# Patient Record
Sex: Male | Born: 1940 | Race: White | Hispanic: No | Marital: Single | Smoking: Former smoker
Health system: Southern US, Community
[De-identification: ages and names within clinical notes are randomized; demographics above are authoritative.]

## PROBLEM LIST (undated history)

## (undated) DIAGNOSIS — E785 Hyperlipidemia, unspecified: Secondary | ICD-10-CM

## (undated) DIAGNOSIS — C801 Malignant (primary) neoplasm, unspecified: Secondary | ICD-10-CM

## (undated) DIAGNOSIS — D649 Anemia, unspecified: Secondary | ICD-10-CM

## (undated) DIAGNOSIS — Z5189 Encounter for other specified aftercare: Secondary | ICD-10-CM

## (undated) DIAGNOSIS — K922 Gastrointestinal hemorrhage, unspecified: Secondary | ICD-10-CM

## (undated) DIAGNOSIS — K219 Gastro-esophageal reflux disease without esophagitis: Secondary | ICD-10-CM

## (undated) DIAGNOSIS — S52502A Unspecified fracture of the lower end of left radius, initial encounter for closed fracture: Secondary | ICD-10-CM

## (undated) DIAGNOSIS — S52602A Unspecified fracture of lower end of left ulna, initial encounter for closed fracture: Secondary | ICD-10-CM

## (undated) DIAGNOSIS — J189 Pneumonia, unspecified organism: Secondary | ICD-10-CM

## (undated) DIAGNOSIS — S42202A Unspecified fracture of upper end of left humerus, initial encounter for closed fracture: Secondary | ICD-10-CM

## (undated) DIAGNOSIS — K449 Diaphragmatic hernia without obstruction or gangrene: Secondary | ICD-10-CM

## (undated) DIAGNOSIS — I350 Nonrheumatic aortic (valve) stenosis: Secondary | ICD-10-CM

## (undated) DIAGNOSIS — R0602 Shortness of breath: Secondary | ICD-10-CM

## (undated) DIAGNOSIS — IMO0001 Reserved for inherently not codable concepts without codable children: Secondary | ICD-10-CM

## (undated) DIAGNOSIS — J449 Chronic obstructive pulmonary disease, unspecified: Secondary | ICD-10-CM

## (undated) DIAGNOSIS — I10 Essential (primary) hypertension: Secondary | ICD-10-CM

## (undated) DIAGNOSIS — R011 Cardiac murmur, unspecified: Secondary | ICD-10-CM

## (undated) DIAGNOSIS — I214 Non-ST elevation (NSTEMI) myocardial infarction: Secondary | ICD-10-CM

## (undated) DIAGNOSIS — M1990 Unspecified osteoarthritis, unspecified site: Secondary | ICD-10-CM

## (undated) HISTORY — DX: Chronic obstructive pulmonary disease, unspecified: J44.9

## (undated) HISTORY — DX: Non-ST elevation (NSTEMI) myocardial infarction: I21.4

## (undated) HISTORY — PX: TOTAL KNEE ARTHROPLASTY: SHX125

## (undated) HISTORY — DX: Nonrheumatic aortic (valve) stenosis: I35.0

## (undated) HISTORY — PX: FRACTURE SURGERY: SHX138

## (undated) HISTORY — PX: JOINT REPLACEMENT: SHX530

## (undated) HISTORY — PX: TONSILLECTOMY: SUR1361

## (undated) HISTORY — PX: UPPER GASTROINTESTINAL ENDOSCOPY: SHX188

## (undated) HISTORY — PX: CARDIAC CATHETERIZATION: SHX172

## (undated) HISTORY — PX: EYE SURGERY: SHX253

## (undated) HISTORY — DX: Hyperlipidemia, unspecified: E78.5

## (undated) HISTORY — PX: TOTAL HIP ARTHROPLASTY: SHX124

---

## 2000-06-06 ENCOUNTER — Encounter: Payer: Self-pay | Admitting: Family Medicine

## 2000-06-06 ENCOUNTER — Encounter: Admission: RE | Admit: 2000-06-06 | Discharge: 2000-06-06 | Payer: Self-pay | Admitting: Family Medicine

## 2000-06-13 ENCOUNTER — Encounter: Payer: Self-pay | Admitting: Family Medicine

## 2000-06-13 ENCOUNTER — Encounter: Admission: RE | Admit: 2000-06-13 | Discharge: 2000-06-13 | Payer: Self-pay | Admitting: Family Medicine

## 2000-08-23 ENCOUNTER — Other Ambulatory Visit: Admission: RE | Admit: 2000-08-23 | Discharge: 2000-08-23 | Payer: Self-pay | Admitting: Family Medicine

## 2000-09-01 ENCOUNTER — Encounter: Payer: Self-pay | Admitting: Family Medicine

## 2000-09-01 ENCOUNTER — Encounter: Admission: RE | Admit: 2000-09-01 | Discharge: 2000-09-01 | Payer: Self-pay | Admitting: Family Medicine

## 2000-10-14 ENCOUNTER — Encounter: Payer: Self-pay | Admitting: Family Medicine

## 2000-10-14 ENCOUNTER — Encounter: Admission: RE | Admit: 2000-10-14 | Discharge: 2000-10-14 | Payer: Self-pay | Admitting: Family Medicine

## 2000-10-31 ENCOUNTER — Ambulatory Visit (HOSPITAL_COMMUNITY): Admission: RE | Admit: 2000-10-31 | Discharge: 2000-10-31 | Payer: Self-pay | Admitting: Gastroenterology

## 2000-11-11 ENCOUNTER — Ambulatory Visit (HOSPITAL_COMMUNITY): Admission: RE | Admit: 2000-11-11 | Discharge: 2000-11-11 | Payer: Self-pay | Admitting: Orthopedic Surgery

## 2000-11-11 ENCOUNTER — Encounter: Payer: Self-pay | Admitting: Orthopedic Surgery

## 2001-11-03 ENCOUNTER — Encounter: Payer: Self-pay | Admitting: Orthopedic Surgery

## 2001-11-08 ENCOUNTER — Inpatient Hospital Stay (HOSPITAL_COMMUNITY): Admission: AD | Admit: 2001-11-08 | Discharge: 2001-11-12 | Payer: Self-pay | Admitting: Orthopedic Surgery

## 2002-03-29 ENCOUNTER — Encounter: Payer: Self-pay | Admitting: Orthopedic Surgery

## 2002-03-29 ENCOUNTER — Inpatient Hospital Stay (HOSPITAL_COMMUNITY): Admission: RE | Admit: 2002-03-29 | Discharge: 2002-04-03 | Payer: Self-pay | Admitting: Orthopedic Surgery

## 2004-02-03 ENCOUNTER — Encounter: Admission: RE | Admit: 2004-02-03 | Discharge: 2004-05-03 | Payer: Self-pay | Admitting: Family Medicine

## 2004-02-10 ENCOUNTER — Ambulatory Visit (HOSPITAL_COMMUNITY): Admission: RE | Admit: 2004-02-10 | Discharge: 2004-02-10 | Payer: Self-pay | Admitting: Gastroenterology

## 2004-12-06 HISTORY — PX: COLONOSCOPY: SHX174

## 2004-12-24 ENCOUNTER — Inpatient Hospital Stay (HOSPITAL_COMMUNITY): Admission: EM | Admit: 2004-12-24 | Discharge: 2004-12-29 | Payer: Self-pay | Admitting: Emergency Medicine

## 2006-07-22 ENCOUNTER — Encounter: Admission: RE | Admit: 2006-07-22 | Discharge: 2006-07-22 | Payer: Self-pay | Admitting: Family Medicine

## 2010-12-06 DIAGNOSIS — I350 Nonrheumatic aortic (valve) stenosis: Secondary | ICD-10-CM

## 2010-12-06 HISTORY — DX: Nonrheumatic aortic (valve) stenosis: I35.0

## 2011-07-07 DIAGNOSIS — I214 Non-ST elevation (NSTEMI) myocardial infarction: Secondary | ICD-10-CM

## 2011-07-07 HISTORY — DX: Non-ST elevation (NSTEMI) myocardial infarction: I21.4

## 2011-07-15 ENCOUNTER — Inpatient Hospital Stay (HOSPITAL_COMMUNITY)
Admission: EM | Admit: 2011-07-15 | Discharge: 2011-07-17 | DRG: 811 | Disposition: A | Discharge status: Home / Self Care | Payer: Medicare Other | Source: Ambulatory Visit | Attending: Internal Medicine | Admitting: Internal Medicine

## 2011-07-15 ENCOUNTER — Emergency Department (HOSPITAL_COMMUNITY): Payer: Medicare Other

## 2011-07-15 DIAGNOSIS — Q391 Atresia of esophagus with tracheo-esophageal fistula: Secondary | ICD-10-CM

## 2011-07-15 DIAGNOSIS — D509 Iron deficiency anemia, unspecified: Principal | ICD-10-CM | POA: Diagnosis present

## 2011-07-15 DIAGNOSIS — I359 Nonrheumatic aortic valve disorder, unspecified: Secondary | ICD-10-CM | POA: Diagnosis present

## 2011-07-15 DIAGNOSIS — K31811 Angiodysplasia of stomach and duodenum with bleeding: Secondary | ICD-10-CM | POA: Diagnosis present

## 2011-07-15 DIAGNOSIS — K449 Diaphragmatic hernia without obstruction or gangrene: Secondary | ICD-10-CM | POA: Diagnosis present

## 2011-07-15 DIAGNOSIS — I5033 Acute on chronic diastolic (congestive) heart failure: Secondary | ICD-10-CM | POA: Diagnosis present

## 2011-07-15 DIAGNOSIS — Z7982 Long term (current) use of aspirin: Secondary | ICD-10-CM

## 2011-07-15 DIAGNOSIS — Z87891 Personal history of nicotine dependence: Secondary | ICD-10-CM

## 2011-07-15 DIAGNOSIS — J4489 Other specified chronic obstructive pulmonary disease: Secondary | ICD-10-CM | POA: Diagnosis present

## 2011-07-15 DIAGNOSIS — I2489 Other forms of acute ischemic heart disease: Secondary | ICD-10-CM | POA: Diagnosis present

## 2011-07-15 DIAGNOSIS — Z96649 Presence of unspecified artificial hip joint: Secondary | ICD-10-CM

## 2011-07-15 DIAGNOSIS — I214 Non-ST elevation (NSTEMI) myocardial infarction: Secondary | ICD-10-CM | POA: Diagnosis present

## 2011-07-15 DIAGNOSIS — J449 Chronic obstructive pulmonary disease, unspecified: Secondary | ICD-10-CM | POA: Diagnosis present

## 2011-07-15 DIAGNOSIS — Z794 Long term (current) use of insulin: Secondary | ICD-10-CM

## 2011-07-15 DIAGNOSIS — I959 Hypotension, unspecified: Secondary | ICD-10-CM | POA: Diagnosis present

## 2011-07-15 DIAGNOSIS — I10 Essential (primary) hypertension: Secondary | ICD-10-CM | POA: Diagnosis present

## 2011-07-15 DIAGNOSIS — E119 Type 2 diabetes mellitus without complications: Secondary | ICD-10-CM | POA: Diagnosis present

## 2011-07-15 DIAGNOSIS — M1990 Unspecified osteoarthritis, unspecified site: Secondary | ICD-10-CM | POA: Diagnosis present

## 2011-07-15 DIAGNOSIS — E785 Hyperlipidemia, unspecified: Secondary | ICD-10-CM | POA: Diagnosis present

## 2011-07-15 DIAGNOSIS — Z96659 Presence of unspecified artificial knee joint: Secondary | ICD-10-CM

## 2011-07-15 DIAGNOSIS — I509 Heart failure, unspecified: Secondary | ICD-10-CM | POA: Diagnosis present

## 2011-07-15 DIAGNOSIS — I248 Other forms of acute ischemic heart disease: Secondary | ICD-10-CM | POA: Diagnosis present

## 2011-07-15 LAB — URINALYSIS, ROUTINE W REFLEX MICROSCOPIC
Bilirubin Urine: NEGATIVE
Glucose, UA: NEGATIVE mg/dL
Hgb urine dipstick: NEGATIVE
Ketones, ur: 15 mg/dL — AB
Protein, ur: NEGATIVE mg/dL

## 2011-07-15 LAB — FOLATE: Folate: >20 ng/mL

## 2011-07-15 LAB — POCT I-STAT TROPONIN I

## 2011-07-15 LAB — DIFFERENTIAL
Basophils Relative: 1 % (ref 0–1)
Lymphocytes Relative: 22 % (ref 12–46)
Monocytes Absolute: 1.1 10*3/uL — ABNORMAL HIGH (ref 0.1–1.0)
Monocytes Relative: 11 % (ref 3–12)
Neutro Abs: 7.1 10*3/uL (ref 1.7–7.7)

## 2011-07-15 LAB — CBC
HCT: 21.8 % — ABNORMAL LOW (ref 39.0–52.0)
Hemoglobin: 6.2 g/dL — CL (ref 13.0–17.0)
MCH: 22.1 pg — ABNORMAL LOW (ref 26.0–34.0)
MCHC: 28.4 g/dL — ABNORMAL LOW (ref 30.0–36.0)
MCV: 77.6 fL — ABNORMAL LOW (ref 78.0–100.0)

## 2011-07-15 LAB — CK TOTAL AND CKMB (NOT AT ARMC)
CK, MB: 8.4 ng/mL (ref 0.3–4.0)
Relative Index: 4.7 — ABNORMAL HIGH (ref 0.0–2.5)
Total CK: 190 U/L (ref 7–232)

## 2011-07-15 LAB — BASIC METABOLIC PANEL
BUN: 33 mg/dL — ABNORMAL HIGH (ref 6–23)
Calcium: 9.6 mg/dL (ref 8.4–10.5)
Creatinine, Ser: 1.24 mg/dL (ref 0.50–1.35)
GFR calc Af Amer: >60 mL/min (ref >60)

## 2011-07-15 LAB — GLUCOSE, CAPILLARY
Glucose-Capillary: 127 mg/dL — ABNORMAL HIGH (ref 70–99)
Glucose-Capillary: 69 mg/dL — ABNORMAL LOW (ref 70–99)

## 2011-07-15 LAB — IRON AND TIBC
Iron: 42 ug/dL (ref 42–135)
Saturation Ratios: 9 % — ABNORMAL LOW (ref 20–55)
TIBC: 473 ug/dL — ABNORMAL HIGH (ref 215–435)
UIBC: 431 ug/dL

## 2011-07-15 LAB — PROTIME-INR: Prothrombin Time: 14.6 seconds (ref 11.6–15.2)

## 2011-07-15 LAB — HEPATIC FUNCTION PANEL
ALT: 17 U/L (ref 0–53)
AST: 63 U/L — ABNORMAL HIGH (ref 0–37)
Albumin: 3.4 g/dL — ABNORMAL LOW (ref 3.5–5.2)
Alkaline Phosphatase: 55 U/L (ref 39–117)
Total Bilirubin: 0.3 mg/dL (ref 0.3–1.2)
Total Protein: 6.6 g/dL (ref 6.0–8.3)

## 2011-07-15 LAB — FERRITIN: Ferritin: 6 ng/mL — ABNORMAL LOW (ref 22–322)

## 2011-07-15 LAB — CARDIAC PANEL(CRET KIN+CKTOT+MB+TROPI): CK, MB: 6.9 ng/mL (ref 0.3–4.0)

## 2011-07-15 LAB — ABO/RH: ABO/RH(D): O POS

## 2011-07-15 LAB — PRO B NATRIURETIC PEPTIDE: Pro B Natriuretic peptide (BNP): 620.9 pg/mL — ABNORMAL HIGH (ref 0–125)

## 2011-07-15 LAB — VITAMIN B12: Vitamin B-12: 270 pg/mL (ref 211–911)

## 2011-07-16 LAB — CBC
Hemoglobin: 8.7 g/dL — ABNORMAL LOW (ref 13.0–17.0)
MCH: 23.9 pg — ABNORMAL LOW (ref 26.0–34.0)
MCHC: 30.1 g/dL (ref 30.0–36.0)
MCHC: 30.1 g/dL (ref 30.0–36.0)
RDW: 16.9 % — ABNORMAL HIGH (ref 11.5–15.5)
RDW: 16.9 % — ABNORMAL HIGH (ref 11.5–15.5)
WBC: 8.5 10*3/uL (ref 4.0–10.5)

## 2011-07-16 LAB — CARDIAC PANEL(CRET KIN+CKTOT+MB+TROPI)
Relative Index: 3.1 — ABNORMAL HIGH (ref 0.0–2.5)
Total CK: 183 U/L (ref 7–232)
Troponin I: 2.77 ng/mL (ref <0.30)

## 2011-07-16 LAB — BASIC METABOLIC PANEL
Calcium: 9 mg/dL (ref 8.4–10.5)
GFR calc Af Amer: >60 mL/min (ref >60)
GFR calc non Af Amer: >60 mL/min (ref >60)
Glucose, Bld: 90 mg/dL (ref 70–99)
Potassium: 3.8 mEq/L (ref 3.5–5.1)
Sodium: 140 mEq/L (ref 135–145)

## 2011-07-16 LAB — LIPID PANEL
HDL: 25 mg/dL — ABNORMAL LOW (ref >39)
LDL Cholesterol: 36 mg/dL (ref 0–99)
Triglycerides: 113 mg/dL (ref <150)

## 2011-07-16 LAB — GLUCOSE, CAPILLARY
Glucose-Capillary: 180 mg/dL — ABNORMAL HIGH (ref 70–99)
Glucose-Capillary: 154 mg/dL — ABNORMAL HIGH (ref 70–99)

## 2011-07-17 DIAGNOSIS — G459 Transient cerebral ischemic attack, unspecified: Secondary | ICD-10-CM

## 2011-07-17 DIAGNOSIS — I359 Nonrheumatic aortic valve disorder, unspecified: Secondary | ICD-10-CM

## 2011-07-17 DIAGNOSIS — I214 Non-ST elevation (NSTEMI) myocardial infarction: Secondary | ICD-10-CM

## 2011-07-17 LAB — CROSSMATCH
Antibody Screen: NEGATIVE
Unit division: 0
Unit division: 0

## 2011-07-17 LAB — CBC
HCT: 30.2 % — ABNORMAL LOW (ref 39.0–52.0)
Hemoglobin: 9.3 g/dL — ABNORMAL LOW (ref 13.0–17.0)
MCH: 24.7 pg — ABNORMAL LOW (ref 26.0–34.0)
MCV: 80.1 fL (ref 78.0–100.0)
RBC: 3.77 MIL/uL — ABNORMAL LOW (ref 4.22–5.81)
WBC: 11.1 10*3/uL — ABNORMAL HIGH (ref 4.0–10.5)

## 2011-07-17 LAB — BASIC METABOLIC PANEL
BUN: 14 mg/dL (ref 6–23)
CO2: 27 mEq/L (ref 19–32)
Chloride: 102 mEq/L (ref 96–112)
Creatinine, Ser: 0.72 mg/dL (ref 0.50–1.35)
Glucose, Bld: 153 mg/dL — ABNORMAL HIGH (ref 70–99)

## 2011-07-17 LAB — HOMOCYSTEINE: Homocysteine: 9.7 umol/L (ref 4.0–15.4)

## 2011-07-17 LAB — GLUCOSE, CAPILLARY: Glucose-Capillary: 171 mg/dL — ABNORMAL HIGH (ref 70–99)

## 2011-07-17 LAB — CARDIAC PANEL(CRET KIN+CKTOT+MB+TROPI)
Relative Index: 2.4 (ref 0.0–2.5)
Total CK: 132 U/L (ref 7–232)

## 2011-07-21 NOTE — H&P (Signed)
NAME:  Casey Gilmore, [name]:  [id number]  MEDICAL RECORD NO.:  [id number]  LOCATION:  [facility]                         FACILITY:  [facility]  PHYSICIAN:  Casey Scott, MD     DATE OF BIRTH:  [birth year]  DATE OF ADMISSION:  07/15/2011 DATE OF DISCHARGE:                             HISTORY & PHYSICAL   PRIMARY CARE PHYSICIAN:  Casey Raider, MD  GASTROENTEROLOGIST:  Casey All. Madilyn Fireman, MD  CHIEF COMPLAINTS:  Progressive generalized weakness.  Chest pain last night.  HISTORY OF PRESENTING ILLNESS:  Casey Gilmore is a 70 year old Caucasian male patient with history of prior episode of GI bleeding secondary to gastric angiodysplasia, type 2 diabetes mellitus, hypertension, dyslipidemia, COPD who was sent from the primary care physician's office secondary to above symptoms and severe anemia with a hemoglobin of 6 gm/dL.  The patient indicates that he has been progressively feeling weak, tired, decreased energy, dyspnea on exertion ongoing for about 3 weeks duration.  He denies any fevers or chills or night sweats.  He indicates his appetite is good.  He denies any nausea or vomiting. There is no history of weight loss.  He has a single BM every day.  The stools have been black for years which he indicates is secondary to iron supplements.  There is no change in his bowel habits.  He has not seen any bright red blood in the stools.  Last night, he experienced a brief episode of left upper dull 3/10 chest pain which was not radiating.  It was associated with some dyspnea, but no diaphoresis.  At times, he has complained of feeling dizzy and lightheadedness on ambulation.  For these persistent symptoms, the patient went to the MD's office, where his hemoglobin was found to be 6 gm/dL.  This was thought to be secondary to GI bleed.  He had a similar presentation in 2006.  The [facility] hospitalist were requested to admit the patient for further evaluation and management.  The ED  physician has consulted the cardiologist for positive cardiac markers suggestive of non-ST elevation MI and the gastroenterologist.  PAST MEDICAL HISTORY: 1. Upper GI bleed secondary to gastric angiodysplasia. 2. Type 2 diabetes mellitus, insulin dependent. 3. Hypertension. 4. Hyperlipidemia. 5. COPD. 6. Hiatal hernia. 7. Osteoarthritis. 8. Aortic sclerosis.  PAST SURGICAL HISTORY: 1. Left total hip replacement. 2. Right hip surgery. 3. Left total knee replacement.  ALLERGIES:  No known drug allergies.  HOME MEDICATIONS AS PER PRIMARY CARE PHYSICIAN'S OFFICE NOTE: 1. Iron 325 mg, 2 tablets p.o. daily. 2. Aspirin 81 mg p.o. daily. 3. Metformin 1000 mg p.o. b.i.d. 4. Omeprazole 40 mg p.o. daily. 5. Lisinopril-hydrochlorothiazide 20/12.5 mg p.o. daily. 6. Glimepiride 4 mg p.o. daily. 7. Lantus SoloSTAR 50 units subcutaneously daily. 8. Hydrocodone/acetaminophen 5-325 mg p.o. b.i.d. p.r.n. 9. Vytorin 10-40 mg p.o. daily.  FAMILY HISTORY:  Negative.  SOCIAL HISTORY:  The patient is single.  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  He lives alone and is independent of activities of daily living.  He quit smoking in 1980s.  There is no history of alcohol or drug abuse.  ADVANCED DIRECTIVES:  Full code.  REVIEW OF SYSTEMS:  All systems reviewed  and apart from history of presenting illness are negative.  PHYSICAL EXAMINATION:  GENERAL:  Casey Gilmore is a moderately built and overweight male patient who is in no obvious distress. VITAL SIGNS:  His temperature 98.5 degrees Fahrenheit, blood pressure 117/60 mmHg which earlier was 94/46 mmHg, pulse 87 per minute regular, respirations 21 per minute and saturating at 98% on room air. HEAD, EYES, ENT:  Nontraumatic, normocephalic.  Pupils equally reacting to light and accommodation.  Oral mucosa is moist and no acute findings. NECK:  Supple.  No JVD.  Bilateral carotid bruits.  Question conducted from his heart murmur.  Neck  is supple. LYMPHATICS:  No lymphadenopathy. RESPIRATORY SYSTEM:  Occasional basal crackles, but otherwise clear to auscultation and no increased work of breathing. CARDIOVASCULAR SYSTEM:  First and second heart sounds heard.  Regular rate and rhythm.  No JVD.  A 2 x 6 systolic murmur at the apex. ABDOMEN:  Nondistended, nontender, soft and normal bowel sounds heard. No organomegaly or mass appreciated. CENTRAL NERVOUS SYSTEM:  The patient is awake, alert, oriented x3 with no focal neurological deficits. EXTREMITIES:  With trace bilateral ankle edema.  Grade 5/5 power. Peripheral pulses symmetrically felt. SKIN:  Without any rashes. MUSCULOSKELETAL SYSTEM:  Negative.  LABORATORY DATA:  Cardiac enzymes show elevated troponin's with a peak of 2.68.  Coagulation indices are within normal limits.  CK-MB was 9 with CK of 190.  Basic metabolic panel shows glucose of 115, BUN 33, creatinine 1.24.  Pro-BNP 621.  Stool occult blood is positive. Urinalysis does not show features of urinary tract infection.  CBC shows hemoglobin of 6.2, hematocrit of 22, MCV 78, white blood cell 11 and platelets 266.  Chest x-ray shows no active cardiopulmonary disease.  EKG is sinus rhythm at 90 beats per minute with normal axis, no ischemic changes found.  ASSESSMENT AND PLAN: 1. Symptomatic severe microcytic anemia, probably iron-deficiency     anemia, possibly from slow gastrointestinal blood loss.  We will     admit the patient to Step-Down Unit.  Obtain anemia panel prior to     transfusion.  Transfuse 3 units of packed red blood cells under IV     Lasix cover.  GI MD have been consulted by the ED physician for     evaluation and the patient may need upper and maybe a lower     endoscopy.  We will hold the patient's aspirin secondary to the GI     bleeding.  We will place the patient on IV proton-pump inhibitors. 2. Non-ST-elevation myocardial infarction secondary to demand ischemia     from his  anemia.  We will cycle cardiac enzymes.  We will obtain 2-     D echocardiogram.  We will hold aspirin secondary to his GI     bleeding issues.  Cardiology has consulted and agree with     transfusing packed red blood cells and once his GI situation is     stabilized then for a possible stress test. 3. Type 2 insulin-dependent diabetes mellitus.  We will place the     patient on the reduced doses of insulin secondary to him being     placed on a clear liquid diet.  We will hold his oral hypoglycemic     agents and monitor. 4. Hypertension.  The patient had a soft blood pressure on arrival.     Given his severe anemia and his GI bleeding, we will hold his     antihypertensives at this  time and monitor his blood pressures     closely. 5. Chronic obstructive pulmonary disease is stable. 6. Full code status. 7. Acute diastolic congestive heart failure secondary to high output     from his severe anemia.  For Lasix in between transfusions and     monitor.  Time taken in coordinating this history and physical note is 60 minutes.     Casey Scott, MD     AH/MEDQ  D:  07/15/2011  T:  07/15/2011  Job:  [id number]  cc:   Casey Gilmore, M.D. John C. Madilyn Gilmore, M.D. Armanda Magic, M.D.  Electronically Signed by Casey Scott MD on 07/21/2011 12:50:00 PM

## 2011-07-21 NOTE — Discharge Summary (Signed)
NAMEMarland Kitchen  RITVIK, MCZEAL NO.:  [id number]  MEDICAL RECORD NO.:  [id number]  LOCATION:  2009                         FACILITY:  [facility]  PHYSICIAN:  Marcellus Scott, MD     DATE OF BIRTH:  [birth year]  DATE OF ADMISSION:  07/15/2011 DATE OF DISCHARGE:  07/17/2011                              DISCHARGE SUMMARY   PRIMARY CARE PHYSICIAN:  Lupita Raider, MD  GASTROENTEROLOGIST:  Everardo All. Madilyn Fireman, MD.  CARDIOLOGIST:  Armanda Magic, MD  DISCHARGE DIAGNOSES: 1. Symptomatic severe iron-deficiency anemia. 2. Possible slow upper gastrointestinal bleeding secondary to     gastroduodenal arteriovenous malformations, status post     obliteration with Argon plasma coagulator. 3. Non-ST-elevation myocardial infarction secondary to demand     ischemia. 4. Severe aortic stenosis.  For outpatient evaluation. 5. Insulin-dependent type 2 diabetes mellitus. 6. Hypertension. 7. Chronic obstructive pulmonary disease. 8. Brief acute diastolic congestive heart failure secondary to high     output from severe anemia.  Resolved. 9. Schatzki's ring and moderate hiatal hernia on EGD. 10.Possible B12 deficiency. 11.Bilateral carotid bruits. 12.Hyperlipidemia.  DISCHARGE MEDICATIONS: 1. Enteric-coated aspirin 81 mg p.o. daily. 2. Vitamin B12 1000 mcg p.o. daily. 3. Ferrous sulfate 325 mg p.o. t.i.d. 4. Amaryl 4 mg p.o. daily. 5. Hydrocodone/acetaminophen 5/325 mg tablet, one p.o. b.i.d. p.r.n.     pain. 6. Lantus 50 units subcutaneously daily. 7. Lopressor 12.5 mg p.o. b.i.d. 8. Metformin 1000 mg p.o. b.i.d. 9. Omeprazole 40 mg p.o. daily. 10.Vytorin 10/40 p.o. q.p.m.  PROCEDURES:  EGD on the July 16, 2011, impression, A.  Gastroduodenal AV malformations obliterated with Argon plasma coagulator.  B. Schatzki's ring and moderate hiatal hernia.  C.  Otherwise normal endoscopy.  IMAGING: 1. Chest x-ray July 15, 2011, impression, no active cardiopulmonary     disease. 2. A 2-D  echocardiogram July 16, 2011, impression, moderate left     ventricular concentric hypertrophy.  Left ventricular ejection     fraction 60-65%.  Severe aortic stenosis. The valve area is 1.06     cm2 by Vmax. 3. Bilateral carotid Dopplers.  No evidence of ICA stenosis.     Vertebral artery flow is antegrade.  LABORATORY DATA:  Cardiac enzymes were positive with peak troponin of 3.17, but normal CK, and a peak CK-MB of 8.4.  CBC hemoglobin 9.3, hematocrit 30.2, white blood cell 11.1, MCV 80, platelets 247.  Basic metabolic panel within normal limits except glucose 153.  Lipid panel significant for HDL 25, LDL 36.  Anemia panel showed iron 42, total iron- binding capacity 473, percentage saturation 9, vitamin B12 270, serum folate greater than 20 and ferritin of 6.  Urinalysis was negative for features of urinary tract infection.  Stool was positive for occult blood.  CONSULTATIONS: 1. Gastroenterology, Dr. Herbert Moors of [facility]. 2. Cardiology Dr. Carolanne Grumbling.  DIET:  Diabetic diet.  ACTIVITY:  Ad lib.  TODAY'S COMPLAINTS:  None.  The patient indicates that he feels fine. He has tolerated regular diet.  He has no dizziness or lightheadedness with ambulation.  He denies any chest pain or dyspnea.  He is eager to go home.  PHYSICAL EXAMINATION:  GENERAL:  The patient is in  no obvious distress. VITAL SIGNS:  Temperature 98.2 degrees Fahrenheit, pulse 82 per minute, respiration rate 20 per minute, blood pressure 121/77 mmHg and saturating at 99% on 2 liters of oxygen.  CBGs range between 154-180 mg/dL. RESPIRATORY SYSTEM:  Clear. CARDIOVASCULAR SYSTEM:  First and second heart sounds heard regular. Systolic ejection murmur 2/6 at the apex radiating to both carotids. ABDOMEN:  Nondistended, soft and normal bowel sounds heard. CENTRAL NERVOUS SYSTEM:  Patient is awake, alert, oriented x3 with no focal neurological deficits.  HOSPITAL COURSE:  Mr. Liou is a very  pleasant 70 year old Caucasian male patient with history of prior upper GI bleed in 2006 secondary to gastric angiodysplasia, insulin-dependent type 2 diabetes mellitus, hypertension, COPD who presented with 3 weeks history of progressive weakness, tiredness, decreased energy and dyspnea on exertion.  He also experienced an episode of left-sided chest pain on the night prior to admission.  He went to see his primary care physician where lab studies revealed a hemoglobin of 6 and the patient was asked to come to the emergency room for admission for further evaluation and management.  PROBLEM: 1. Asymptomatic severe iron deficiency anemia.  This was most likely     secondary to slow upper GI bleed from gastroduodenal AV     malformations.  There is possible B12 deficiency too.  The patient     was admitted to the step-down unit.  He was transfused a total of 3     units of packed red blood cells with appropriate response.     Gastroenterology was consulted.  They performed an EGD and     obliterated the AV malformations as indicated above.  They     recommend avoiding NSAIDs, but have cleared him for to continue his     low-dose aspirin.  He will ultimately likely need outpatient     capsule endoscopy.  This dictator has discussed with the     gastroenterologist on-call who have cleared him for discharge.  He     is to follow up with his primary gastroenterologist in 3-4 weeks     from discharge. 2. Slow upper GI bleed secondary to gastroduodenal AV malformations.     Management as above. 3. Non-ST-elevation MI secondary to demand ischemia.  Cardiology was     consulted.  They indicate that this was a difficult situation.  He     needs a cath, but any intervention such as CABG, aortic valve     replacement or PCI would require anticoagulation which would be     contraindicated in the recent GI bleed..  They recommend adding low-     dose beta blocker and statin and continuing the  low-dose aspirin     and follow up with the primary cardiologist in 2 weeks from     discharge.  If his hemoglobins remained stable and no further     bleeding then may proceed with right and left heart cath and     question CABG and aortic valve replacement. 4. Severe aortic stenosis.  Management as per above. 5. Insulin-dependent type 2 diabetes mellitus.  Continue home dose of     insulins and oral medications. 6. Hypertension.  The patient's antihypertensive medications were held     secondary to soft or low blood pressures.  He is being started on     low-dose beta blockers.  We will hold his     lisinopril/hydrochlorothiazide at this time.  However, consider     resuming  at least the ACE inhibitor at low dose as an outpatient     for her nephro protection from his diabetes.  DISPOSITION:  The patient is discharged home in stable condition.  FOLLOWUP RECOMMENDATIONS.: 1. Dr. Lupita Raider.  The patient is to call for an appointment to     be seen in 5-7 days with repeat CBCs. 2. Dr. Dorena Cookey.  The patient is to call for an appointment to be     seen in 3-4 weeks. 3. Dr. Carolanne Grumbling.  The patient is to call for an appointment to be     seen in 2 weeks.  Time taken in coordinating this discharge was 40 minutes.   Marcellus Scott, MD     AH/MEDQ  D:  07/17/2011  T:  07/17/2011  Job:  [id number]  cc:   Lupita Raider, M.D. John C. Madilyn Fireman, M.D. Armanda Magic, M.D.  Electronically Signed by Marcellus Scott MD on 07/21/2011 07:36:40 PM

## 2011-08-01 ENCOUNTER — Inpatient Hospital Stay (HOSPITAL_COMMUNITY)
Admission: EM | Admit: 2011-08-01 | Discharge: 2011-08-08 | DRG: 193 | Disposition: A | Discharge status: Home / Self Care | Payer: Medicare Other | Attending: Internal Medicine | Admitting: Internal Medicine

## 2011-08-01 ENCOUNTER — Emergency Department (HOSPITAL_COMMUNITY): Payer: Medicare Other

## 2011-08-01 DIAGNOSIS — E785 Hyperlipidemia, unspecified: Secondary | ICD-10-CM | POA: Diagnosis present

## 2011-08-01 DIAGNOSIS — Z794 Long term (current) use of insulin: Secondary | ICD-10-CM

## 2011-08-01 DIAGNOSIS — I509 Heart failure, unspecified: Secondary | ICD-10-CM | POA: Diagnosis present

## 2011-08-01 DIAGNOSIS — J449 Chronic obstructive pulmonary disease, unspecified: Secondary | ICD-10-CM | POA: Diagnosis present

## 2011-08-01 DIAGNOSIS — I359 Nonrheumatic aortic valve disorder, unspecified: Secondary | ICD-10-CM | POA: Diagnosis present

## 2011-08-01 DIAGNOSIS — I2510 Atherosclerotic heart disease of native coronary artery without angina pectoris: Secondary | ICD-10-CM | POA: Diagnosis present

## 2011-08-01 DIAGNOSIS — IMO0001 Reserved for inherently not codable concepts without codable children: Secondary | ICD-10-CM | POA: Diagnosis present

## 2011-08-01 DIAGNOSIS — J4489 Other specified chronic obstructive pulmonary disease: Secondary | ICD-10-CM | POA: Diagnosis present

## 2011-08-01 DIAGNOSIS — Z7982 Long term (current) use of aspirin: Secondary | ICD-10-CM

## 2011-08-01 DIAGNOSIS — J189 Pneumonia, unspecified organism: Principal | ICD-10-CM | POA: Diagnosis present

## 2011-08-01 DIAGNOSIS — K5730 Diverticulosis of large intestine without perforation or abscess without bleeding: Secondary | ICD-10-CM | POA: Diagnosis present

## 2011-08-01 DIAGNOSIS — Q391 Atresia of esophagus with tracheo-esophageal fistula: Secondary | ICD-10-CM

## 2011-08-01 DIAGNOSIS — I5031 Acute diastolic (congestive) heart failure: Secondary | ICD-10-CM | POA: Diagnosis present

## 2011-08-01 DIAGNOSIS — D649 Anemia, unspecified: Secondary | ICD-10-CM | POA: Diagnosis present

## 2011-08-01 DIAGNOSIS — J9600 Acute respiratory failure, unspecified whether with hypoxia or hypercapnia: Secondary | ICD-10-CM | POA: Diagnosis not present

## 2011-08-01 DIAGNOSIS — K648 Other hemorrhoids: Secondary | ICD-10-CM | POA: Diagnosis present

## 2011-08-01 DIAGNOSIS — Z87891 Personal history of nicotine dependence: Secondary | ICD-10-CM

## 2011-08-01 DIAGNOSIS — D126 Benign neoplasm of colon, unspecified: Secondary | ICD-10-CM | POA: Diagnosis present

## 2011-08-01 DIAGNOSIS — K219 Gastro-esophageal reflux disease without esophagitis: Secondary | ICD-10-CM | POA: Diagnosis present

## 2011-08-01 DIAGNOSIS — Z79899 Other long term (current) drug therapy: Secondary | ICD-10-CM

## 2011-08-01 DIAGNOSIS — I252 Old myocardial infarction: Secondary | ICD-10-CM

## 2011-08-01 DIAGNOSIS — K449 Diaphragmatic hernia without obstruction or gangrene: Secondary | ICD-10-CM | POA: Diagnosis present

## 2011-08-01 DIAGNOSIS — Q393 Congenital stenosis and stricture of esophagus: Secondary | ICD-10-CM

## 2011-08-01 HISTORY — DX: Essential (primary) hypertension: I10

## 2011-08-01 LAB — GLUCOSE, CAPILLARY: Glucose-Capillary: 157 mg/dL — ABNORMAL HIGH (ref 70–99)

## 2011-08-01 LAB — CARDIAC PANEL(CRET KIN+CKTOT+MB+TROPI)
CK, MB: 3 ng/mL (ref 0.3–4.0)
CK, MB: 3.2 ng/mL (ref 0.3–4.0)
Troponin I: <0.3 ng/mL (ref <0.30)
Troponin I: <0.3 ng/mL (ref <0.30)

## 2011-08-01 LAB — HEMOGLOBIN A1C: Hgb A1c MFr Bld: 5.6 % (ref <5.7)

## 2011-08-01 LAB — HEMOGLOBIN AND HEMATOCRIT, BLOOD
HCT: 24.8 % — ABNORMAL LOW (ref 39.0–52.0)
Hemoglobin: 7.3 g/dL — ABNORMAL LOW (ref 13.0–17.0)

## 2011-08-01 LAB — DIFFERENTIAL
Basophils Absolute: 0.1 10*3/uL (ref 0.0–0.1)
Eosinophils Absolute: 0 10*3/uL (ref 0.0–0.7)
Eosinophils Relative: 0 % (ref 0–5)
Lymphocytes Relative: 7 % — ABNORMAL LOW (ref 12–46)
Monocytes Absolute: 1.1 10*3/uL — ABNORMAL HIGH (ref 0.1–1.0)

## 2011-08-01 LAB — BASIC METABOLIC PANEL
CO2: 25 mEq/L (ref 19–32)
Calcium: 8.7 mg/dL (ref 8.4–10.5)
Creatinine, Ser: 0.79 mg/dL (ref 0.50–1.35)
GFR calc Af Amer: >60 mL/min (ref >60)
GFR calc non Af Amer: >60 mL/min (ref >60)
Sodium: 136 mEq/L (ref 135–145)

## 2011-08-01 LAB — CBC
MCH: 22.3 pg — ABNORMAL LOW (ref 26.0–34.0)
MCHC: 29.2 g/dL — ABNORMAL LOW (ref 30.0–36.0)
MCV: 76.3 fL — ABNORMAL LOW (ref 78.0–100.0)
Platelets: ADEQUATE 10*3/uL (ref 150–400)
RBC: 3.55 MIL/uL — ABNORMAL LOW (ref 4.22–5.81)
RDW: 19.1 % — ABNORMAL HIGH (ref 11.5–15.5)

## 2011-08-01 LAB — POCT I-STAT TROPONIN I: Troponin i, poc: 0.13 ng/mL (ref 0.00–0.08)

## 2011-08-01 LAB — OCCULT BLOOD, POC DEVICE: Fecal Occult Bld: POSITIVE

## 2011-08-02 ENCOUNTER — Inpatient Hospital Stay (HOSPITAL_COMMUNITY): Payer: Medicare Other

## 2011-08-02 LAB — CBC
MCH: 22.6 pg — ABNORMAL LOW (ref 26.0–34.0)
MCV: 77.4 fL — ABNORMAL LOW (ref 78.0–100.0)
RBC: 3.68 MIL/uL — ABNORMAL LOW (ref 4.22–5.81)
RDW: 18.6 % — ABNORMAL HIGH (ref 11.5–15.5)
WBC: 12.1 10*3/uL — ABNORMAL HIGH (ref 4.0–10.5)

## 2011-08-02 LAB — GLUCOSE, CAPILLARY
Glucose-Capillary: 188 mg/dL — ABNORMAL HIGH (ref 70–99)
Glucose-Capillary: 120 mg/dL — ABNORMAL HIGH (ref 70–99)
Glucose-Capillary: 177 mg/dL — ABNORMAL HIGH (ref 70–99)

## 2011-08-02 LAB — BASIC METABOLIC PANEL
BUN: 15 mg/dL (ref 6–23)
Chloride: 105 mEq/L (ref 96–112)
GFR calc non Af Amer: >60 mL/min (ref >60)
Glucose, Bld: 194 mg/dL — ABNORMAL HIGH (ref 70–99)
Potassium: 4 mEq/L (ref 3.5–5.1)

## 2011-08-02 LAB — CARDIAC PANEL(CRET KIN+CKTOT+MB+TROPI)
Relative Index: 1.2 (ref 0.0–2.5)
Troponin I: <0.3 ng/mL (ref <0.30)

## 2011-08-03 ENCOUNTER — Inpatient Hospital Stay (HOSPITAL_COMMUNITY): Payer: Medicare Other

## 2011-08-03 LAB — BASIC METABOLIC PANEL
Chloride: 100 mEq/L (ref 96–112)
GFR calc Af Amer: >60 mL/min (ref >60)
GFR calc non Af Amer: >60 mL/min (ref >60)
Potassium: 3.6 mEq/L (ref 3.5–5.1)

## 2011-08-03 LAB — HEMOGLOBIN AND HEMATOCRIT, BLOOD: HCT: 30.8 % — ABNORMAL LOW (ref 39.0–52.0)

## 2011-08-03 LAB — CBC
Platelets: 238 10*3/uL (ref 150–400)
RBC: 3.62 MIL/uL — ABNORMAL LOW (ref 4.22–5.81)
WBC: 9.4 10*3/uL (ref 4.0–10.5)

## 2011-08-03 LAB — PRO B NATRIURETIC PEPTIDE: Pro B Natriuretic peptide (BNP): 2091 pg/mL — ABNORMAL HIGH (ref 0–125)

## 2011-08-03 LAB — D-DIMER, QUANTITATIVE: D-Dimer, Quant: 1.62 ug/mL-FEU — ABNORMAL HIGH (ref 0.00–0.48)

## 2011-08-03 LAB — GLUCOSE, CAPILLARY: Glucose-Capillary: 215 mg/dL — ABNORMAL HIGH (ref 70–99)

## 2011-08-03 MED ORDER — IOHEXOL 300 MG/ML  SOLN
100.0000 mL | Freq: Once | INTRAMUSCULAR | Status: AC | PRN
  Administered 2011-08-03: 100 mL via INTRAVENOUS

## 2011-08-04 ENCOUNTER — Other Ambulatory Visit: Payer: Self-pay | Admitting: Gastroenterology

## 2011-08-04 ENCOUNTER — Inpatient Hospital Stay (HOSPITAL_COMMUNITY): Payer: Medicare Other

## 2011-08-04 ENCOUNTER — Other Ambulatory Visit (HOSPITAL_COMMUNITY): Payer: Medicare Other

## 2011-08-04 LAB — BASIC METABOLIC PANEL
BUN: 14 mg/dL (ref 6–23)
Calcium: 8.3 mg/dL — ABNORMAL LOW (ref 8.4–10.5)
GFR calc Af Amer: >60 mL/min (ref >60)
GFR calc non Af Amer: >60 mL/min (ref >60)
Potassium: 3.6 mEq/L (ref 3.5–5.1)
Sodium: 137 mEq/L (ref 135–145)

## 2011-08-04 LAB — CBC
HCT: 27.2 % — ABNORMAL LOW (ref 39.0–52.0)
MCHC: 29.4 g/dL — ABNORMAL LOW (ref 30.0–36.0)
RDW: 19 % — ABNORMAL HIGH (ref 11.5–15.5)

## 2011-08-04 LAB — BLOOD GAS, ARTERIAL
Acid-Base Excess: 4.1 mmol/L — ABNORMAL HIGH (ref 0.0–2.0)
Drawn by: 32526
O2 Content: 4 L/min
pCO2 arterial: 37.8 mmHg (ref 35.0–45.0)
pH, Arterial: 7.476 — ABNORMAL HIGH (ref 7.350–7.450)
pO2, Arterial: 59.1 mmHg — ABNORMAL LOW (ref 80.0–100.0)

## 2011-08-04 LAB — GLUCOSE, CAPILLARY
Glucose-Capillary: 208 mg/dL — ABNORMAL HIGH (ref 70–99)
Glucose-Capillary: 199 mg/dL — ABNORMAL HIGH (ref 70–99)
Glucose-Capillary: 197 mg/dL — ABNORMAL HIGH (ref 70–99)

## 2011-08-05 ENCOUNTER — Other Ambulatory Visit (HOSPITAL_COMMUNITY): Payer: Medicare Other

## 2011-08-05 LAB — CROSSMATCH
ABO/RH(D): O POS
Unit division: 0

## 2011-08-05 LAB — CBC
HCT: 27.1 % — ABNORMAL LOW (ref 39.0–52.0)
Hemoglobin: 7.9 g/dL — ABNORMAL LOW (ref 13.0–17.0)
MCH: 22.3 pg — ABNORMAL LOW (ref 26.0–34.0)
MCHC: 29.2 g/dL — ABNORMAL LOW (ref 30.0–36.0)
MCV: 76.3 fL — ABNORMAL LOW (ref 78.0–100.0)
RDW: 19.1 % — ABNORMAL HIGH (ref 11.5–15.5)

## 2011-08-05 LAB — BASIC METABOLIC PANEL
BUN: 15 mg/dL (ref 6–23)
Creatinine, Ser: 0.72 mg/dL (ref 0.50–1.35)
GFR calc Af Amer: >60 mL/min (ref >60)
GFR calc non Af Amer: >60 mL/min (ref >60)
Glucose, Bld: 167 mg/dL — ABNORMAL HIGH (ref 70–99)

## 2011-08-05 LAB — GLUCOSE, CAPILLARY: Glucose-Capillary: 170 mg/dL — ABNORMAL HIGH (ref 70–99)

## 2011-08-06 LAB — CBC
HCT: 28.2 % — ABNORMAL LOW (ref 39.0–52.0)
MCHC: 29.1 g/dL — ABNORMAL LOW (ref 30.0–36.0)
MCV: 76.6 fL — ABNORMAL LOW (ref 78.0–100.0)
Platelets: 234 10*3/uL (ref 150–400)
RDW: 19.2 % — ABNORMAL HIGH (ref 11.5–15.5)
WBC: 7.1 10*3/uL (ref 4.0–10.5)

## 2011-08-06 LAB — GLUCOSE, CAPILLARY
Glucose-Capillary: 244 mg/dL — ABNORMAL HIGH (ref 70–99)
Glucose-Capillary: 164 mg/dL — ABNORMAL HIGH (ref 70–99)
Glucose-Capillary: 233 mg/dL — ABNORMAL HIGH (ref 70–99)

## 2011-08-06 LAB — VANCOMYCIN, TROUGH: Vancomycin Tr: 9.6 ug/mL — ABNORMAL LOW (ref 10.0–20.0)

## 2011-08-06 LAB — BASIC METABOLIC PANEL
BUN: 17 mg/dL (ref 6–23)
Calcium: 8.2 mg/dL — ABNORMAL LOW (ref 8.4–10.5)
GFR calc non Af Amer: >60 mL/min (ref >60)
Glucose, Bld: 167 mg/dL — ABNORMAL HIGH (ref 70–99)
Potassium: 3.3 mEq/L — ABNORMAL LOW (ref 3.5–5.1)

## 2011-08-07 ENCOUNTER — Inpatient Hospital Stay (HOSPITAL_COMMUNITY): Payer: Medicare Other

## 2011-08-07 ENCOUNTER — Encounter (HOSPITAL_COMMUNITY): Payer: Self-pay | Admitting: Radiology

## 2011-08-07 LAB — GLUCOSE, CAPILLARY: Glucose-Capillary: 171 mg/dL — ABNORMAL HIGH (ref 70–99)

## 2011-08-07 LAB — CBC
HCT: 27.3 % — ABNORMAL LOW (ref 39.0–52.0)
Hemoglobin: 8 g/dL — ABNORMAL LOW (ref 13.0–17.0)
MCH: 22 pg — ABNORMAL LOW (ref 26.0–34.0)
MCV: 75.2 fL — ABNORMAL LOW (ref 78.0–100.0)
Platelets: 256 10*3/uL (ref 150–400)
RBC: 3.63 MIL/uL — ABNORMAL LOW (ref 4.22–5.81)

## 2011-08-07 LAB — BASIC METABOLIC PANEL
BUN: 16 mg/dL (ref 6–23)
CO2: 31 mEq/L (ref 19–32)
Chloride: 101 mEq/L (ref 96–112)
Creatinine, Ser: 0.83 mg/dL (ref 0.50–1.35)
Glucose, Bld: 124 mg/dL — ABNORMAL HIGH (ref 70–99)

## 2011-08-07 MED ORDER — IOHEXOL 300 MG/ML  SOLN
100.0000 mL | Freq: Once | INTRAMUSCULAR | Status: AC | PRN
  Administered 2011-08-07: 100 mL via INTRAVENOUS

## 2011-08-08 LAB — CBC
MCH: 22 pg — ABNORMAL LOW (ref 26.0–34.0)
MCHC: 29.2 g/dL — ABNORMAL LOW (ref 30.0–36.0)
RDW: 19.3 % — ABNORMAL HIGH (ref 11.5–15.5)

## 2011-08-08 LAB — BASIC METABOLIC PANEL
Calcium: 9.1 mg/dL (ref 8.4–10.5)
Creatinine, Ser: 0.88 mg/dL (ref 0.50–1.35)
GFR calc non Af Amer: >60 mL/min (ref >60)
Sodium: 141 mEq/L (ref 135–145)

## 2011-08-08 LAB — GLUCOSE, CAPILLARY
Glucose-Capillary: 261 mg/dL — ABNORMAL HIGH (ref 70–99)
Glucose-Capillary: 188 mg/dL — ABNORMAL HIGH (ref 70–99)
Glucose-Capillary: 190 mg/dL — ABNORMAL HIGH (ref 70–99)

## 2011-08-08 LAB — PRO B NATRIURETIC PEPTIDE: Pro B Natriuretic peptide (BNP): 660.2 pg/mL — ABNORMAL HIGH (ref 0–125)

## 2011-08-08 NOTE — Discharge Summary (Signed)
NAMEMarland Kitchen  Gilmore, [name]:  [id number]  MEDICAL RECORD NO.:  [id number]  LOCATION:  6714                         FACILITY:  [facility]  PHYSICIAN:  Isidor Holts, M.D.  DATE OF BIRTH:  1941-11-21  DATE OF ADMISSION:  08/01/2011 DATE OF DISCHARGE:  08/07/2011                              DISCHARGE SUMMARY   PRIMARY MD:  Lupita Raider, MD  PRIMARY GASTROENTEROLOGIST:  Graylin Shiver, MD  DISCHARGE DIAGNOSES: 1. Right lower lobe pneumonia, healthcare-associated. 2. Acute diastolic congestive heart failure. 3. Chronic obstructive pulmonary disease. 4. Acute hypoxic respiratory failure, multifactorial, secondary to     right lower lobe pneumonia, healthcare-associated, acute diastolic     congestive heart failure, chronic obstructive pulmonary disease;     against a background of anemia. 5. History of gastroduodenal AVMs, status post recent ablation. 6. Acute on chronic anemia with positive fecal occult blood testing. 7. Tubulovillous polyp descending colon, confirmed by colonoscopy     August 04, 2011. 8. Coronary artery disease. 9. Severe aortic stenosis. 10.Recent non-ST segment myocardial infarction secondary to demand     ischemia, during last hospitalization. 11.Dyslipidemia. 12.History of hiatal hernia/gastroesophageal reflux disease. 13.Dyslipidemia. 14.Type 2 diabetes mellitus, insulin requiring.  DISCHARGE MEDICATIONS: 1. Combivent inhaler via spacer device. 2 puffs inhaled p.r.n. q.6     hourly for shortness of breath. 2. Furosemide 40 mg p.o. daily. 3. Mucinex 1200 mg p.o. b.i.d. 4. Avelox 400 mg p.o. daily for 7 days, from August 09, 2011. 5. K-Dur 20 mEq p.o. daily. 6. Aspirin enteric-coated 81 mg p.o. daily. 7. Cyanocobalamin 1000 mcg p.o. daily. 8. Ferrous sulfate 325 mg p.o. t.i.d. after meals. 9. Glimepiride 4 mg p.o. daily. 10.Hydrocodone/APAP (5/325) 1 p.o. p.r.n. b.i.d. for pain. 11.Lantus insulin via SoloSTAR Pen, 50  subcutaneously at bedtime. 12.Metoprolol tartrate 12.5 mg p.o. b.i.d. 13.Omeprazole 40 mg p.o. daily. 14.Vytorin (10/40) one p.o. evening.  PROCEDURES: 1. Chest x-ray, August 01, 2011, this showed interval mild     cardiomegaly and mild-to-moderate bronchitic changes, small     posterior pleural effusions and small amount of atypical     atelectasis or pneumonia. 2. Chest x-ray, August 02, 2011, this showed increasing pulmonary     vascular congestion in the setting of cardiomegaly and bilateral     pleural effusion suggesting congestive heart failure.  No focal     airspace disease to suggest pneumonia. 3. Chest CT angiogram, August 03, 2011, this showed no main lupus     pulmonary embolism.  Suboptimal evaluation of the segmental and     subsegmental branch is due to respiratory motion and cough and     contrast bolus timing, left greater than right lower lobe     consolidation, trace pleural effusions.  There was circumferential     esophageal wall thickening and small hiatal hernia, cardiomegaly,     aortic valve calcification, and coronary artery calcification.  A     few poorly evaluated nodule due to respiratory motion. 4. Modified barium swallow examination, August 03, 2011.  This showed     overall functional oropharyngeal swallow mechanism with base of     tongue mildly reduced retraction resulting in residue in the  vallecula.  Recommended regular diet with thin liquids. 5. Chest x-ray, August 04, 2011, this showed stable left lower lobe     airspace disease and trace pleural effusion. 6. CT angiogram, abdomen/pelvis, August 07, 2011. This showed no acute abnormalities.    Colonic diverticulosis without diverticulitis. No small bowel abnormalities.  CONSULTATIONS: 1. Petra Kuba, MD, gastroenterologist. 2. Graylin Shiver, MD, gastroenterologist.  ADMISSION HISTORY:  As in H and P notes of August 01, 2011, dictated by Dr. Baltazar Najjar.  However, in brief, this  is a 70 year old male, with known history of hospitalization from July 15, 2011 to July 17, 2011 for upper GI bleed secondary to gastroduodenal AV malformation status post obliteration with argon plasma laser, NSTEMI during the hospitalization secondary to demand ischemia, severe aortic stenosis, insulin-requiring type 2 diabetes mellitus, hypertension, COPD, diastolic congestive heart failure, ejection fraction 60% to 65% per 2-D echocardiogram July 16, 2011, schatzki ring, moderate hiatal hernia, vitamin B12 deficiency, bilateral carotid bruits, dyslipidemia, presenting with cough and progressive shortness of breath without fever or chills.  On initial evaluation, he was found to have chest x-ray finding suggestive of airspace disease.  He was therefore, admitted for further evaluation, investigation, and management.  CLINICAL COURSE: 1. Pneumonia.  The patient's imaging studies, including chest CT     angiogram, demonstrated a pneumonia, For the details of findings,     refer to procedure list above.  He was commenced on empiric     vancomycin and Zosyn to cover healthcare-associated pneumonia and     azithromycin was added to cover atypicals.  The patient did well     with this antibiotic regimen, although he had one episode of     pyrexia as high as 101.2 on August 03, 2011.  Thereafter, clinical     improvement was steady and by August 07, 2011, we were able to     switch him to monotherapy with Avelox alone, for further 7 days of     antibiotic treatment.  2. Congestive heart failure.  The patient had episodes of shortness of     breath associated with wheeze in the first few days of     hospitalization.  Chest imaging studies failed to demonstrate overt     congestive heart failure, although there were elevated interstitial     markings on x-ray.  However, his BNP was significantly elevated at     2091.  He was managed with intravenous Lasix and by August 08, 2011,      BNP was down to 660.2.  The patient felt considerably better.  He     has been transitioned to oral Lasix, going forward.  3. COPD.  This was likely exacerbated by above-mentioned conditions and      although the patient did not require steroid, it was managed with     bronchodilator nebulizers with satisfactory clinical response.  4. Acute hypoxic respiratory failure.  The patient did become acutely     hypoxic on August 04, 2011 with O2 saturation of 88% on room air as     well as visible shortness of breath.  This was felt to be     multifactorial, secondary to above-mentioned pathologies.  ABG at     that time on 4 L of oxygen showed pH of 7.476, pCO2 37.8, pO2 59.1,     and the bicarbonate of 27.6.  He was transferred to the step-down     unit for close observation and with  gradual improvement, we were     able to wean his oxygen requirements.  As of August 08, 2011,     respiratory failure had resolved clinically.  5. Dyslipidemia.  The patient continues on preadmission dose of Zetia.  6. GERD.  This is not too problematic.  The patient continues on     proton pump inhibitor.  7. Anemia/recent GI bleed.  The patient as described above, was     recently diagnosed with upper GI bleed secondary to gastroduodenal     AVMs, which were addressed with argon laser.  However, at the time     of presentation on this occasion, his hemoglobin was 7.9.  He was     transfused with 1 unit of PRBC, with a satisfactory bump in     hemoglobin to 8.3 on August 02, 2011. Hemoglobin has remained     stable since and as a matter of fact on August 08, 2011, was 8.2.     GI consultation was called and was kindly provided initially by Dr.     Ewing Schlein, whose recommendation was to perform colonoscopy.  This was carried     out by Dr. Herbert Moors on August 04, 2011 and revealed a tubulo-villous     polyp in the descending colon which was biopsied.  He also had     diverticulosis, but no evidence of  active bleed.  Dr. Herbert Moors     has ordered a CT angiography which was carried out on August 07, 2011 and was unremarkable.  The patient will follow-up on outpatient basis with Dr.     Evette Cristal or Dr. Ewing Schlein.  DISPOSITION:  The patient was on August 08, 2011 90, asymptomatic, very keen to be discharged.  There are no new issues.  He was considered clinically stable and was therefore discharged accordingly.  ACTIVITY:  As tolerated.  Recommended to increase activity slowly.  DIET:  Heart-healthy/carbohydrate modified.  FOLLOWUP INSTRUCTIONS:  The patient will follow up routinely with his primary MD, Dr. Lupita Raider within 1-2 weeks and follow up with Dr. Evette Cristal, gastroenterologist in the coming week.  He is instructed to call for an appointment.  In addition, he already has an appointment scheduled to see Dr. Armanda Magic.  He has been recommended to keep this appointment.  All these have been communicated to the patient, verbalized understanding.     Isidor Holts, M.D.     CO/MEDQ  D:  08/08/2011  T:  08/08/2011  Job:  [id number]  cc:   Lupita Raider, M.D. John C. Madilyn Fireman, M.D. Graylin Shiver, M.D. Armanda Magic, M.D.  Electronically Signed by Isidor Holts M.D. on 08/08/2011 07:19:56 PM

## 2011-08-18 NOTE — Op Note (Signed)
  NAME:  Casey Gilmore, [name]:  [id number]  MEDICAL RECORD NO.:  [id number]  LOCATION:  4742                         FACILITY:  [facility]  PHYSICIAN:  Graylin Shiver, M.D.   DATE OF BIRTH:  Apr 16, 1941  DATE OF PROCEDURE:  08/04/2011 DATE OF DISCHARGE:                              OPERATIVE REPORT   PROCEDURE:  Colonoscopy with biopsy.  INDICATIONS FOR PROCEDURE:  Anemia, heme-positive stool.  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  Informed consent was obtained after explanation of the risks of bleeding, infection and perforation.  PREMEDICATION:  Fentanyl 25 mcg IV, Versed 3 mg IV.  PROCEDURE:  With the patient in the left lateral decubitus position, a rectal exam was performed and no masses were felt.  The Pentax colonoscope was inserted into the rectum and advanced around the colon to the cecum.  Cecal landmarks were identified.  The terminal ileum was intubated and the first few centimeters of terminal ileum looked normal. I saw no evidence of melena or bright red blood or any type of lesion in the terminal ileum.  The cecum and ascending colon looked normal.  The transverse colon looked normal.  In the proximal descending colon, there was a small 5-mm polyp that was biopsied off with cold forceps.  There were a few diverticula in the sigmoid and the rectum looked normal.  He tolerated the procedure well without complications.  The prep was fair. I had to do a lot of washing but I feel that the view of the colon was reasonably good.  Retroflexion did not reveal any specific lesions other than some small hemorrhoids.  IMPRESSION: 1. 5-mm polyp in the descending colon. 2. Diverticulosis. 3. Small internal hemorrhoids.          ______________________________ Graylin Shiver, M.D.     SFG/MEDQ  D:  08/04/2011  T:  08/04/2011  Job:  [id number]  cc:   [facility]  Electronically Signed by Herbert Moors MD on 08/18/2011 08:52:54 AM

## 2011-08-18 NOTE — Consult Note (Signed)
NAME:  Casey Gilmore, [name]:  [id number]  MEDICAL RECORD NO.:  [id number]  LOCATION:  2923                         FACILITY:  [facility]  PHYSICIAN:  Graylin Shiver, M.D.   DATE OF BIRTH:  01-11-1941  DATE OF CONSULTATION:  07/15/2011 DATE OF DISCHARGE:                                CONSULTATION   REASON FOR CONSULTATION:  The patient is a 70 year old male who presents to the Emergency Room at [facility] with complaints of weakness and tiredness which has been progressive over the past several weeks. He was found to be anemic with a hemoglobin and hematocrit of 6.2 and 21.8.  He denies rectal bleeding, melena, or hematemesis.  He states his stools are dark, but he takes iron.  He has no abdominal pain.  He otherwise has been feeling fine.  In 2005, he had a colonoscopy done by Dr. Madilyn Fireman, which showed sigmoid diverticulosis.  In 2001, he had a colonoscopy and did have an adenomatous polyp.  In 2005, he had an EGD, and in 2006, he had an EGD for evaluation of anemia with findings of a gastric AVM which was cauterized using the ERBE by Dr. Madilyn Fireman.  The patient states that he thought that he was getting anemic again because of the way he was feeling.  PAST HISTORY:  Diabetes, COPD, hypertension, dyslipidemia.  He is short of breath, but he states he is always short of breath.  PRIOR SURGERIES:  Knee and hip surgery.  SOCIAL HISTORY:  Does not smoke or drink alcohol.  ALLERGIES:  None.  CURRENT MEDICATIONS:  Glimepiride, lisinopril, omeprazole, metformin, aspirin, iron, Vytorin, Vicodin, Lantus.  REVIEW OF SYSTEMS:  He is not complaining of any anginal chest pains.  PHYSICAL EXAMINATION:  GENERAL:  He does not appear in any acute distress.  He is alert and oriented, nonicteric. HEART:  Regular rhythm, grade 2/6 systolic murmur. LUNGS:  Clear. ABDOMEN:  Bowel sounds are normal.  It is soft, nontender.  There is no obvious  hepatosplenomegaly.  IMPRESSION:  Severe anemia.  PLAN:  The patient is being admitted to the hospital by the [facility].  He is going to receive blood transfusions to get his hemoglobin and hematocrit up to an acceptable level given the history of the gastric AVM in the past.  We will go ahead and set him up initially for a repeat EGD tomorrow and see if he has recurrence of gastric AVMs.  It has been 6 years since his last colonoscopy. He does have a history of adenomatous colon polyp and is therefore due for repeat examination.  It is possible that if we get his hemoglobin and hematocrit up to an acceptable level and do not find any significant bleeding from the upper GI tract that colonoscopy could be followed up on as an outpatient.  I do not see where he has ever had a capsule endoscopy; and if his EGD and colonoscopy are negative, consideration of capsule endoscopy could be done, but again I think the latter two could probably be done as an outpatient.          ______________________________ Graylin Shiver, M.D.     SFG/MEDQ  D:  07/15/2011  T:  07/15/2011  Job:  [id number]  cc:   [facility] John C. Madilyn Fireman, M.D. Lupita Raider, M.D.  Electronically Signed by Herbert Moors MD on 08/18/2011 08:52:49 AM

## 2011-08-25 NOTE — Consult Note (Signed)
  NAME:  Casey Gilmore, [name]:  [id number]  MEDICAL RECORD NO.:  [id number]  LOCATION:  4742                         FACILITY:  [facility]  PHYSICIAN:  Petra Kuba, M.D.    DATE OF BIRTH:  10/17/1941  DATE OF CONSULTATION:  08/01/2011 DATE OF DISCHARGE:                                CONSULTATION   HISTORY:  The patient seen by most of my partners in the past with guaiac-positive anemia, comes in today with pneumonia, has no GI symptoms, but during last hospital stay had a small AVM cauterized.  We are consulted for further workup and plans.  His past medical history is pertinent for iron deficiency as well as some coronary artery disease, COPD, severe aortic stenosis, insulin- dependent diabetes, heart failure, increased cholesterol, and history of reflux and a Schatzki ring.  ALLERGIES:  None.  CURRENT MEDICINES:  Aspirin, B12, iron, Amaryl, hydrocodone, insulin, Lopressor, metformin, omeprazole, and Vytorin.  FAMILY HISTORY:  Negative for any obvious GI problems.  SOCIAL HISTORY:  Used to smoke, but quit.  Denies any alcohol.  PHYSICAL EXAMINATION:  GENERAL:  No acute distress. ABDOMEN:  Soft and nontender. VITAL SIGNS:  Stable.  LABORATORY DATA:  Pertinent for normal BUN and creatinine.  Hemoglobin of 7.9 with a white count of 13.6, MCV 76, platelets are clumped.  When he was discharged on July 17, 2011, his hemoglobin was 9.3 with an MCV of 80.  ASSESSMENT: 1. Multiple medical problems in a patient on aspirin and aortic     stenosis. 2. History of Arteriovenous malformations and guaiac-positive anemia,     now with decreasing hemoglobin.  PLAN:  He is due for colonoscopy, so he will probably need that at some point as well as possible capsule endoscopy next possibly even repeat an endoscopy, all of these can be done at the appropriate time.  Dr. Evette Cristal will follow next week and decide the timing of those.     ______________________________ Petra Kuba, M.D.     MEM/MEDQ  D:  08/01/2011  T:  08/01/2011  Job:  [id number]  cc:   Lupita Raider, M.D.  Electronically Signed by Vida Rigger M.D. on 08/25/2011 02:56:43 PM

## 2011-09-06 ENCOUNTER — Ambulatory Visit
Admission: RE | Admit: 2011-09-06 | Discharge: 2011-09-06 | Disposition: A | Discharge status: Home / Self Care | Payer: Medicare Other | Source: Ambulatory Visit | Attending: Family Medicine | Admitting: Family Medicine

## 2011-09-06 ENCOUNTER — Other Ambulatory Visit: Payer: Self-pay | Admitting: Family Medicine

## 2011-09-06 DIAGNOSIS — J189 Pneumonia, unspecified organism: Secondary | ICD-10-CM

## 2011-09-15 NOTE — H&P (Signed)
NAME:  Casey Gilmore, [name]:  [id number]  MEDICAL RECORD NO.:  [id number]  LOCATION:  [facility]                         FACILITY:  [facility]  PHYSICIAN:  Casey Najjar, MD     DATE OF BIRTH:  Dec 16, 1940  DATE OF ADMISSION:  08/01/2011 DATE OF DISCHARGE:                             HISTORY & PHYSICAL   PRIMARY CARE PHYSICIAN:  Casey Raider, MD  CODE STATUS:  Full code.  CHIEF COMPLAINT:  Cough and shortness of breath.  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  The patient also had a non-ST elevation MI during that hospitalization secondary to demand ischemia.  He was discharged on July 17, 2011.  For the last couple of days, the patient started coughing.  He described his cough as dry, however, severe associated with shortness of breath.  Denies any chest pain, however, stated that he has chest discomfort with coughing.  He denies any fever or chills at home.  Denies any runny nose or sore throat.  In the ED, the patient's workup revealed infiltrate versus atelectasis in his chest x-ray and a small pleural effusion.  We are asked to admit the patient for healthcare-associated pneumonia.  His labs also showed drop in his hemoglobin from baseline 9.3 on July 17, 2011, to 7.9 today.  However, the patient denies any hematochezia or hemoptysis since discharge.  PAST MEDICAL HISTORY: 1. Recent hospitalization for severe iron-deficiency anemia, thought     to be secondary to gastroduodenal arteriovenous malformation status     post obliteration with argon plasma coagulator. 2. Recent non-ST elevation MI, thought to be secondary to demand     ischemia. 3.  COPD. 4. Severe aortic stenosis. 5. Insulin-dependent type 2 diabetes mellitus. 6. Diastolic congestive heart failure. 7. Hyperlipidemia. 8. Schatzki ring and moderate hiatal hernia on EGD.  ALLERGIES:  No known drug allergy.  HOME MEDICATIONS:  Medication reconciliation form still pending, however, as per recent discharge summary, he was discharged on July 17, 2011, on, 1. Aspirin 81 mg. 2. B12 1000 mcg p.o. daily. 3. Ferrous sulfate 325 mg p.o. t.i.d. 4. Amaryl 4 mg p.o. daily. 5. Hydrocodone/acetaminophen 5/325 one tablet p.o. b.i.d. p.r.n. for     pain. 6. Lantus insulin 50 units subcutaneously daily. 7. Lopressor 12.5 mg p.o. b.i.d. 8. Metformin 1000 mg p.o. b.i.d. 9. Omeprazole 40 mg p.o. daily. 10.Vytorin 10/40 mg p.o. q.p.m.  The above medication needs to be verified and reconciled.  SOCIAL HISTORY:  Lives alone.  Used to smoke heavily in the past for about 15-20 years, quit in 1990.  Denies any EtOH, drinking, or illicit drug use.  FAMILY HISTORY:  Noncontributory.  REVIEW OF SYSTEMS:  As above in HPI.  CHEST:  Significant for cough and shortness of breath.  CARDIOVASCULAR:  No chest pain.  No palpitation. ABDOMEN:  No nausea.  No vomiting.  No change in bowel habits.  No hematochezia or hematemesis.  GU:  Denies any burning with micturition.  Denies any increasing urinary frequency or any flank pain. CONSTITUTIONAL:  He denies any fever or chills.  PHYSICAL EXAMINATION:  VITAL SIGNS:  Blood pressure 118/69, heart rate 97, temperature 100.3, O2 sat 96% on oxygen. GENERAL:  He is alert, not in acute distress. NECK:  Supple.  No JVD. LUNGS:  Showed decreased breathing sounds.  I could not appreciate any rales or wheezing. CARDIOVASCULAR:  S1 and S2, regular rhythm and rate. ABDOMEN:  Obese, soft, nontender. EXTREMITIES:  Showed no pedal edema. NEUROLOGIC:  He is alert and oriented x3.  No focal neurological deficit appreciated.  LABORATORY DATA:  Stool for  occult blood is positive.  Potassium 3.8, BUN 13, creatinine 0.79.  WBC 13.6, hemoglobin 7.9, hematocrit 27.1. Platelet clumps noted on smears, however, count appears adequate. Troponin-I 0.13.  EKG showed sinus tachycardia with a rate of 101 Gilmore per minute, right axis deviation, old inferior infarct.  No significant changes from previous EKG.  RADIOLOGY/IMAGING STUDIES:  Chest x-ray showed mild cardiomegaly and mild-to-moderate bronchitic changes.  Small posterior pleural effusion and small amount of adjacent atelectasis or pneumonia.  ASSESSMENT: 1. Healthcare-associated pneumonia. 2. Acute-on-chronic anemia, possibly secondary to slow upper     gastrointestinal bleed secondary to gastroduodenal arteriovenous     malformation which was obliterated with argon plasma coagulator on     his recent admission. 3. Insulin-dependent diabetes mellitus type 2. 4. Hypertension. 5. History of chronic obstructive pulmonary disease. 6. History of diastolic congestive heart failure. 7. Recent non-ST elevation myocardial infarction, currently chest pain-     free with no new EKG changes.  PLAN: 1. The patient will be admitted to telemetry floor. 2. We will cover him with antibiotics as per protocol for healthcare-     associated pneumonia with cefepime, Levaquin, and vancomycin. 3. Nebulizer treatment with albuterol and Atrovent p.r.n. 4. Regarding acute-on-chronic anemia, we will monitor H and H q.8 h     for 24 hours.  I will keep him on clear liquid diet, type and     crossmatch for 2 units of blood, and start him on IV Protonix.     Monitor serial H and H very closely.  If his hemoglobin drops to     less than 7.5, consider blood transfusion and GI consultation. 5. We will hold his oral hypoglycemic agents and cover only with     insulin. 6. Reconcile home medication and continue.  CODE STATUS:  The patient is a full code.          ______________________________ Casey Najjar,  MD     SA/MEDQ  D:  08/01/2011  T:  08/01/2011  Job:  [id number]  cc:   Casey Gilmore, M.D.  Electronically Signed by Casey Beat MD on 09/15/2011 07:48:14 PM

## 2011-09-28 ENCOUNTER — Inpatient Hospital Stay (HOSPITAL_BASED_OUTPATIENT_CLINIC_OR_DEPARTMENT_OTHER)
Admission: RE | Admit: 2011-09-28 | Discharge: 2011-09-28 | Disposition: A | Discharge status: Home / Self Care | Payer: Medicare Other | Source: Ambulatory Visit | Attending: Cardiology | Admitting: Cardiology

## 2011-09-28 DIAGNOSIS — I359 Nonrheumatic aortic valve disorder, unspecified: Secondary | ICD-10-CM | POA: Insufficient documentation

## 2011-09-28 DIAGNOSIS — I252 Old myocardial infarction: Secondary | ICD-10-CM | POA: Insufficient documentation

## 2011-09-28 LAB — POCT I-STAT 3, ART BLOOD GAS (G3+)
O2 Saturation: 93 %
TCO2: 26 mmol/L (ref 0–100)
pCO2 arterial: 39.7 mmHg (ref 35.0–45.0)

## 2011-09-28 LAB — POCT I-STAT 3, VENOUS BLOOD GAS (G3P V)
O2 Saturation: 67 %
O2 Saturation: 69 %
TCO2: 27 mmol/L (ref 0–100)
pCO2, Ven: 44 mmHg — ABNORMAL LOW (ref 45.0–50.0)
pCO2, Ven: 45.6 mmHg (ref 45.0–50.0)
pH, Ven: 7.365 — ABNORMAL HIGH (ref 7.250–7.300)
pO2, Ven: 36 mmHg (ref 30.0–45.0)
pO2, Ven: 37 mmHg (ref 30.0–45.0)

## 2011-09-29 ENCOUNTER — Institutional Professional Consult (permissible substitution) (INDEPENDENT_AMBULATORY_CARE_PROVIDER_SITE_OTHER): Payer: Medicare Other | Admitting: Cardiothoracic Surgery

## 2011-09-29 ENCOUNTER — Encounter: Payer: Self-pay | Admitting: Cardiothoracic Surgery

## 2011-09-29 VITALS — BP 107/67 | HR 84 | Resp 20 | Ht 72.0 in | Wt 225.0 lb

## 2011-09-29 DIAGNOSIS — I359 Nonrheumatic aortic valve disorder, unspecified: Secondary | ICD-10-CM

## 2011-09-29 DIAGNOSIS — I350 Nonrheumatic aortic (valve) stenosis: Secondary | ICD-10-CM

## 2011-09-29 NOTE — Patient Instructions (Signed)
Continue current medications. Aortic valve replacement with a tissue valve is scheduled  for Nov8

## 2011-09-29 NOTE — Progress Notes (Signed)
PCP is Casey Raider, MD Referring Provider is Quintella Reichert, MD                    [address] [place]            Casey Gilmore [address]          [phone]          Chief Complaint  Patient presents with  . Aortic Stenosis    referral from Dr Mayford Knife for surgical eval, cath'ed on 09/28/11     HPI: Is the patient is a 70 year old Caucasian male diabetic who presents for evaluation of recently diagnosed moderate to severe aortic stenosis. The patient has significant dyspnea on exertion but denies orthopnea or PND. 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 This was treated with endoscopic argon laser ablation therapy. However the patient apparently aspirated and developed pneumonia. He had positive cardiac enzymes and a 2-D echo was performed which showed moderate to severe aortic stenosis with left ventricular hypertrophy, preserved LV systolic function and abnormal LV diastolic function. Transvalvular gradient was 40-50 mmHg. The aortic valve area was 0.9 1.0. There is no significant mitral valve disease. There's no significant pericardial effusion.  Patient subsequently underwent a left heart catheter yesterday by Dr. Mayford Knife. The aortic valve could not be crossed for a ventriculogram. Coronary showed no significant disease. PA pressures were normal at 35/14. Cardiac output was 5.5 L per minute. Right atrial pressure was 8 mm mercury.  Because of the patient's class III symptoms and significant aortic stenosis he presents for evaluation of possible aortic valve replacement. Patient has maintained a sinus rhythm throughout his recent illnesses. He does have a long history of cardiac murmur but he denies history of previous rheumatic disease.   Past Medical History  Diagnosis Date  . Diabetes mellitus   . Hypertension     No past surgical history on file. The patient has undergone left knee replacement and  bilateral hip surgery.  No family history on file. the patient states family history is positive for diabetes and hypertension.  Social History History  Substance Use Topics  . Smoking status: Former Smoker    Types: Cigarettes    Quit date: 12/06/1978  . Smokeless tobacco: Never Used  . Alcohol Use: No    No current outpatient prescriptions on file.   the patient's current medications include Lantus insulin, Reglan Nipride, lisinopril HCTZ, metoprolol, aspirin, aspirin, Prilosec, Combivent inhaler, potassium, Vytorin, and hydrocodone and oral iron.  No Known Allergies  Review of Systems  Constitutional review is negative for fever or weight loss. He states he is over the episode of lower lobe pneumonia for which he was hospitalized in August. Denies productive cough. His weight and energy and appetite are improved. He does have dyspnea on exertion which is baseline. He has not smoked in over 40 years. While he was hospitalized he had a carotid duplex scan which showed no significant carotid artery disease. He also had a 2-D echo revealing the results noted above. A recent chest x-ray taken on October 1 shows resolution of the pneumonia with clear lung fields, no pleural effusion. A CT scan of the chest during hospitalization to rule out PE showed no significant dilatation of the thoracic aorta. There was calcification of the aorta and there was coronary calcification noted. He states his diabetes is fairly well controlled he has no knowledge of a hemoglobin  A1c. He denies history DVT claudication.  BP 107/67  Pulse 84  Resp 20  Ht 6' (1.829 m)  Wt 225 lb (102.059 kg)  BMI 30.52 kg/m2  SpO2 93% Physical Exam  General appearance is that of an elderly Caucasian male in no acute distress. HEENT exam is normocephalic he is edentulous with L. Plates. Neck is without bruit or JVD. Lymphatics no palpable adenopathy the neck or supralevator fossa. Thorax is without deformity. Breath sounds  are diminished but clear bilaterally. Cardiac exam is regular rhythm with a grade 4/6 systolic ejection murmur. Noted diastolic murmur no S3 gallop present.   abdomen is obese soft nontender without pulsatile mass. Extremities reveal no clubbing cyanosis or edema. -Exam reveals palpable radial and femoral pulses nonpalpable pedal pulses. Neurologic exam is alert and oriented without focal motor deficit.   Diagnostics: Office spirometry reveals his FEV1 to be over 3 L ,  FVC to be over 4 L. Formal PFTs are pending.     I reviewed the patient's 2-D echo, coronary arteriogram, CT scan of the chest, most recent chest x-ray.  Impression:  symptomatic moderate to severe aortic stenosis with significant diastolic dysfunction on 2-D echo. Normal coronaries. Recent pneumonia for which he is recovered.   aortic valve replacement with a tissue valve appears to be indicated in this patient on an elective schedule.  Plan:The patient will be scheduled for aortic valve replacement November 8 with his preoperative assessment on November 6.

## 2011-09-29 NOTE — Progress Notes (Deleted)
PCP is Lupita Raider, MD Referring Provider is Quintella Reichert, MD  Chief Complaint  Patient presents with  . Aortic Stenosis    referral from Dr Mayford Knife for surgical eval, cath'ed on 09/28/11     HPI: ***  Past Medical History  Diagnosis Date  . Diabetes mellitus   . Hypertension     No past surgical history on file.  No family history on file.  Social History History  Substance Use Topics  . Smoking status: Former Smoker    Types: Cigarettes    Quit date: 12/06/1978  . Smokeless tobacco: Never Used  . Alcohol Use: No    No current outpatient prescriptions on file.    No Known Allergies  Review of Systems: ***  BP 107/67  Pulse 84  Resp 20  Ht 6' (1.829 m)  Wt 225 lb (102.059 kg)  BMI 30.52 kg/m2  SpO2 93% Physical Exam: ***  Diagnostic Tests: ***  Impression: ***  Plan: ***

## 2011-09-30 NOTE — Cardiovascular Report (Signed)
NAME:  Casey Gilmore, [name]:  [id number]  MEDICAL RECORD NO.:  [id number]  LOCATION:                                 FACILITY:  PHYSICIAN:  Armanda Magic, M.D.     DATE OF BIRTH:  09/02/1941  DATE OF PROCEDURE:  09/28/2011 DATE OF DISCHARGE:                           CARDIAC CATHETERIZATION   REFERRING PHYSICIAN:  Dr. Cam Hai.  PROCEDURE:  Right and left heart catheterization, coronary angiography.  OPERATOR:  Armanda Magic, MD  INDICATIONS:  Recent non-ST-elevation MI in the setting of pneumonia felt secondary to ______ mismatch, it is also in the setting of severe anemia.  He was diagnosed at that time was severe aortic stenosis with an aortic valve area 1.1 cm squared.  He now presents for cardiac catheterization.  The patient was brought to the cardiac catheterization laboratory in a fasting, nonsedated state.  Informed consent was obtained.  The patient was connected to continuous heart rate and pulse oximetry monitoring and blood pressure monitoring.  The right groin was prepped and draped in sterile fashion.  1% Xylocaine was used for local anesthesia.  Using modified Seldinger technique, a 4-French sheath was placed in the right femoral vein.  Using the modified Seldinger technique, a 5-French sheath was placed in the right femoral vein.  Under fluoroscopic guidance, a Swan-Ganz catheter was placed into the right atrium.  Right atrial pressure and O2 saturations were obtained.  The catheter was then advanced into the right ventricle and right ventricular pressure was obtained.  The catheter was then advanced into the pulmonary artery. Pulmonary arterial pressure was measured.  The catheter was then advanced to the pulmonary capillary wedge position.  Pulmonary capillary wedge pressure was obtained.  The balloon was then deflated, the catheter was pulled back in the main pulmonary artery and O2 saturations were obtained.  Cardiac outputs were  performed on 4 consecutive injections of saline.  When this was completed, the Swan-Ganz catheter was removed under fluoroscopic guidance.  Under fluoroscopic guidance, a 4-French JL-4 catheter was placed in the vicinity of the left coronary ostium but could not engage the ostium adequately.  It was exchanged out over a guidewire for a 4-French JL-4 catheter which successfully engaged the coronary ostium.  Multiple cine films were taken at 30 degree RAO, 40 degree LAO views.  This catheter was exchanged out over a guidewire for a 4-French, JR-4 catheter which again could not successfully engage the coronary ostium.  This was exchanged out over a guidewire for a 4-French 3D RCA catheter which successfully engaged the right coronary ostium.  Multiple cine films were taken at 30 degree RAO, 40 degree LAO views.  This catheter was then pulled back and a wire was passed through the catheter with attempt to cross the aortic valve.  The wire was able to pass across the aortic valve after multiple attempts, but the catheter could not be placed into the ventricle because of irritability of the ventricle.  The catheter was removed and a 4-French angled pigtail catheter was placed in the vicinity of the aortic valve with multiple attempts to cross the valve which were unsuccessful.  The catheter was exchanged over a guidewire for  a 4-French JR4 catheter which again was used in an attempt to cross the aortic valve was unsuccessful.  At the end of procedure, all catheters and sheaths were removed.  Manual compression was performed and hemostasis was obtained.  The patient transferred back to room in stable condition.  RESULTS: 1. The left main coronary is widely patent and bifurcates in the left     anterior descending artery and left circumflex artery. 2. Left anterior descending artery is widely patent throughout its     course, giving rise to a very large diagonal branch which is widely      patent and bifurcates in the anterior artery vessels both of which     are widely patent.  The LAD is completely patent throughout its     course. 3. Left circumflex is widely patent throughout its course, traverses     in the AV groove.  It gives rise to first obtuse marginal branch     which is large in size and widely patent.  It then gives rise to a     second obtuse marginal branch which is large as well, and widely     patent. 4. The right coronary is widely patent throughout its course and     distally bifurcates into posterior descending artery and posterior     lateral artery both of which are widely patent. 5. Again left ventriculography was not performed because of the     inability to cross the aortic valve.  RIGHT HEART CATH DATA:  Right atrium 8/6 with a mean of 6 mmHg.  RV pressure 33/1 with a mean of 7 mmHg.  Pulmonary artery 29/6 with a mean of 17 mmHg.  Pulmonary catheter wedge 12/11 with a mean 9 mmHg and aortic pressure 152/79 mmHg.  O2 saturations, aorta 93%, PA 69%, RAO 67%.  Cardiac output 6-6.5 and thermodilution 5.9, cardiac index 2.9 and thermodilution 2.7.  ASSESSMENT: 1. Normal coronary. 2. Moderate-to-severe aortic stenosis by echo that was done in August. 3. Normal left ventricular function by echo on August. 4. Normal right heart pressures.  PLAN:  We will discharge to home after IV fluid and bed rest are complete.  I am going to repeat a 2D echocardiogram to see we can get better planimetry estimate of his aortic valve area.  I going to go ahead and refer him to CVTS for further evaluation as well.     Armanda Magic, M.D.     TT/MEDQ  D:  09/28/2011  T:  09/28/2011  Job:  [id number]  cc:   Dr. Cam Hai.  Electronically Signed by Armanda Magic M.D. on 09/30/2011 02:14:46 PM

## 2011-10-04 ENCOUNTER — Other Ambulatory Visit: Payer: Self-pay | Admitting: Cardiothoracic Surgery

## 2011-10-04 DIAGNOSIS — I359 Nonrheumatic aortic valve disorder, unspecified: Secondary | ICD-10-CM

## 2011-10-04 DIAGNOSIS — I059 Rheumatic mitral valve disease, unspecified: Secondary | ICD-10-CM

## 2011-10-11 ENCOUNTER — Encounter (HOSPITAL_COMMUNITY): Payer: Self-pay

## 2011-10-12 ENCOUNTER — Encounter (HOSPITAL_COMMUNITY): Payer: Self-pay

## 2011-10-12 ENCOUNTER — Ambulatory Visit (HOSPITAL_COMMUNITY)
Admission: RE | Admit: 2011-10-12 | Discharge: 2011-10-12 | Disposition: A | Discharge status: Home / Self Care | Payer: Medicare Other | Source: Ambulatory Visit | Attending: Cardiothoracic Surgery | Admitting: Cardiothoracic Surgery

## 2011-10-12 ENCOUNTER — Encounter (HOSPITAL_COMMUNITY)
Admission: RE | Admit: 2011-10-12 | Discharge: 2011-10-12 | Disposition: A | Discharge status: Home / Self Care | Payer: Medicare Other | Source: Ambulatory Visit | Attending: Cardiothoracic Surgery | Admitting: Cardiothoracic Surgery

## 2011-10-12 DIAGNOSIS — Z01811 Encounter for preprocedural respiratory examination: Secondary | ICD-10-CM | POA: Insufficient documentation

## 2011-10-12 DIAGNOSIS — Z01818 Encounter for other preprocedural examination: Secondary | ICD-10-CM | POA: Insufficient documentation

## 2011-10-12 DIAGNOSIS — Z01812 Encounter for preprocedural laboratory examination: Secondary | ICD-10-CM | POA: Insufficient documentation

## 2011-10-12 DIAGNOSIS — I359 Nonrheumatic aortic valve disorder, unspecified: Secondary | ICD-10-CM | POA: Insufficient documentation

## 2011-10-12 HISTORY — DX: Shortness of breath: R06.02

## 2011-10-12 HISTORY — DX: Diaphragmatic hernia without obstruction or gangrene: K44.9

## 2011-10-12 HISTORY — DX: Anemia, unspecified: D64.9

## 2011-10-12 HISTORY — DX: Encounter for other specified aftercare: Z51.89

## 2011-10-12 HISTORY — DX: Pneumonia, unspecified organism: J18.9

## 2011-10-12 HISTORY — DX: Unspecified osteoarthritis, unspecified site: M19.90

## 2011-10-12 HISTORY — DX: Cardiac murmur, unspecified: R01.1

## 2011-10-12 HISTORY — DX: Reserved for inherently not codable concepts without codable children: IMO0001

## 2011-10-12 LAB — COMPREHENSIVE METABOLIC PANEL
ALT: 21 U/L (ref 0–53)
AST: 30 U/L (ref 0–37)
Albumin: 3.6 g/dL (ref 3.5–5.2)
Alkaline Phosphatase: 72 U/L (ref 39–117)
BUN: 16 mg/dL (ref 6–23)
CO2: 23 mEq/L (ref 19–32)
Calcium: 9.8 mg/dL (ref 8.4–10.5)
Chloride: 108 mEq/L (ref 96–112)
Creatinine, Ser: 0.83 mg/dL (ref 0.50–1.35)
GFR calc Af Amer: >90 mL/min (ref >90)
GFR calc non Af Amer: 87 mL/min — ABNORMAL LOW (ref >90)
Glucose, Bld: 68 mg/dL — ABNORMAL LOW (ref 70–99)
Potassium: 3.9 mEq/L (ref 3.5–5.1)
Sodium: 143 mEq/L (ref 135–145)
Total Bilirubin: 0.4 mg/dL (ref 0.3–1.2)
Total Protein: 6.7 g/dL (ref 6.0–8.3)

## 2011-10-12 LAB — BLOOD GAS, ARTERIAL
Acid-Base Excess: 0.4 mmol/L (ref 0.0–2.0)
Bicarbonate: 24.1 mEq/L — ABNORMAL HIGH (ref 20.0–24.0)
Drawn by: 206361
FIO2: 0.21 %
O2 Saturation: 96.7 %
Patient temperature: 98.6
TCO2: 25.2 mmol/L (ref 0–100)
pCO2 arterial: 36.1 mmHg (ref 35.0–45.0)
pH, Arterial: 7.44 (ref 7.350–7.450)
pO2, Arterial: 131 mmHg — ABNORMAL HIGH (ref 80.0–100.0)

## 2011-10-12 LAB — APTT: aPTT: 30 seconds (ref 24–37)

## 2011-10-12 LAB — URINALYSIS, ROUTINE W REFLEX MICROSCOPIC
Bilirubin Urine: NEGATIVE
Glucose, UA: NEGATIVE mg/dL
Hgb urine dipstick: NEGATIVE
Ketones, ur: NEGATIVE mg/dL
Leukocytes, UA: NEGATIVE
Nitrite: NEGATIVE
Protein, ur: NEGATIVE mg/dL
Specific Gravity, Urine: 1.026 (ref 1.005–1.030)
Urobilinogen, UA: 1 mg/dL (ref 0.0–1.0)
pH: 5.5 (ref 5.0–8.0)

## 2011-10-12 LAB — TYPE AND SCREEN
ABO/RH(D): O POS
Antibody Screen: NEGATIVE

## 2011-10-12 LAB — CBC
HCT: 43.1 % (ref 39.0–52.0)
Hemoglobin: 13.7 g/dL (ref 13.0–17.0)
MCH: 27.3 pg (ref 26.0–34.0)
MCHC: 31.8 g/dL (ref 30.0–36.0)
MCV: 86 fL (ref 78.0–100.0)
Platelets: 165 10*3/uL (ref 150–400)
RBC: 5.01 MIL/uL (ref 4.22–5.81)
RDW: 18.6 % — ABNORMAL HIGH (ref 11.5–15.5)
WBC: 8.9 10*3/uL (ref 4.0–10.5)

## 2011-10-12 LAB — SURGICAL PCR SCREEN
MRSA, PCR: NEGATIVE
Staphylococcus aureus: NEGATIVE

## 2011-10-12 LAB — HEMOGLOBIN A1C
Hgb A1c MFr Bld: 6.5 % — ABNORMAL HIGH (ref <5.7)
Mean Plasma Glucose: 140 mg/dL — ABNORMAL HIGH (ref <117)

## 2011-10-12 LAB — PROTIME-INR
INR: 1.06 (ref 0.00–1.49)
Prothrombin Time: 14 seconds (ref 11.6–15.2)

## 2011-10-12 MED ORDER — CHLORHEXIDINE GLUCONATE 4 % EX LIQD: 30.0000 mL | CUTANEOUS | Status: DC

## 2011-10-12 MED ORDER — ALBUTEROL SULFATE (5 MG/ML) 0.5% IN NEBU
2.5000 mg | INHALATION_SOLUTION | Freq: Once | RESPIRATORY_TRACT | Status: AC
  Administered 2011-10-12: 2.5 mg via RESPIRATORY_TRACT

## 2011-10-12 NOTE — Pre-Procedure Instructions (Signed)
20 ARIN PERAL  10/12/2011   Your procedure is scheduled on:  10/15/11  Report to [facility] Short Stay Center a 530 AM.  Call this number if you have problems the morning of surgery: [phone]   Remember:   Do not eat food:After Midnight.  Do not drink clear liquids: 4 Hours before arrival.  Take these medicines the morning of surgery with A SIP OF WATER: metoprolol, omeprazole, asa   Do not wear jewelry, make-up or nail polish.  Do not wear lotions, powders, or perfumes. You may wear deodorant.  Do not shave 48 hours prior to surgery.  Do not bring valuables to the hospital.  Contacts, dentures or bridgework may not be worn into surgery.  Leave suitcase in the car. After surgery it may be brought to your room.  For patients admitted to the hospital, checkout time is 11:00 AM the day of discharge.   Patients discharged the day of surgery will not be allowed to drive home.  Name and phone number of your driver: lamari beckles  [phone]  Special Instructions: Incentive Spirometry - Practice and bring it with you on the day of surgery. and CHG Shower Use Special Wash: 1/2 bottle night before surgery and 1/2 bottle morning of surgery.   Please read over the following fact sheets that you were given: Pain Booklet, Coughing and Deep Breathing, Blood Transfusion Information, Open Heart Packet, MRSA Information and Surgical Site Infection Prevention,incentive spiromitor

## 2011-10-14 MED ORDER — DEXTROSE 5 % IV SOLN
1.5000 g | INTRAVENOUS | Status: DC
  Filled 2011-10-14: qty 1.5

## 2011-10-14 MED ORDER — DEXTROSE 5 % IV SOLN
750.0000 mg | INTRAVENOUS | Status: DC
  Filled 2011-10-14: qty 750

## 2011-10-14 MED ORDER — DOPAMINE-DEXTROSE 3.2-5 MG/ML-% IV SOLN
2.0000 ug/kg/min | INTRAVENOUS | Status: DC
  Filled 2011-10-14: qty 250

## 2011-10-14 MED ORDER — SODIUM CHLORIDE 0.9 % IV SOLN
INTRAVENOUS | Status: DC
  Filled 2011-10-14: qty 1

## 2011-10-14 MED ORDER — PHENYLEPHRINE HCL 10 MG/ML IJ SOLN
30.0000 ug/min | INTRAVENOUS | Status: DC
  Filled 2011-10-14: qty 2

## 2011-10-14 MED ORDER — MAGNESIUM SULFATE 50 % IJ SOLN
40.0000 meq | INTRAMUSCULAR | Status: DC
  Filled 2011-10-14: qty 10

## 2011-10-14 MED ORDER — POTASSIUM CHLORIDE 2 MEQ/ML IV SOLN
80.0000 meq | INTRAVENOUS | Status: DC
  Filled 2011-10-14: qty 40

## 2011-10-14 MED ORDER — EPINEPHRINE HCL 1 MG/ML IJ SOLN
0.5000 ug/min | INTRAVENOUS | Status: DC
  Filled 2011-10-14: qty 4

## 2011-10-14 MED ORDER — DIAZEPAM 5 MG PO TABS
5.0000 mg | ORAL_TABLET | Freq: Once | ORAL | Status: AC
  Administered 2011-10-15: 5 mg via ORAL

## 2011-10-14 MED ORDER — PLASMA-LYTE 148 IV SOLN
INTRAVENOUS | Status: AC
  Administered 2011-10-15: 09:00:00
  Filled 2011-10-14: qty 0.5

## 2011-10-14 MED ORDER — SODIUM CHLORIDE 0.9 % IV SOLN
INTRAVENOUS | Status: DC
  Filled 2011-10-14: qty 40

## 2011-10-14 MED ORDER — METOPROLOL TARTRATE 12.5 MG HALF TABLET
12.5000 mg | ORAL_TABLET | Freq: Once | ORAL | Status: DC
  Filled 2011-10-14: qty 1

## 2011-10-14 MED ORDER — NITROGLYCERIN IN D5W 200-5 MCG/ML-% IV SOLN
2.0000 ug/min | INTRAVENOUS | Status: DC
  Filled 2011-10-14: qty 250

## 2011-10-14 MED ORDER — VANCOMYCIN HCL 1000 MG IV SOLR
1500.0000 mg | INTRAVENOUS | Status: DC
  Filled 2011-10-14: qty 1500

## 2011-10-14 MED ORDER — SODIUM CHLORIDE 0.9 % IV SOLN
0.1000 ug/kg/h | INTRAVENOUS | Status: DC
  Filled 2011-10-14: qty 4

## 2011-10-14 NOTE — Progress Notes (Signed)
Spoke with Dee at [facility] per Dr. Morton Peters patient is "good to go" for surgery. No doppler in system.

## 2011-10-15 ENCOUNTER — Inpatient Hospital Stay (HOSPITAL_COMMUNITY): Payer: Medicare Other | Admitting: Anesthesiology

## 2011-10-15 ENCOUNTER — Inpatient Hospital Stay (HOSPITAL_COMMUNITY): Payer: Medicare Other

## 2011-10-15 ENCOUNTER — Encounter (HOSPITAL_COMMUNITY): Payer: Self-pay | Admitting: Anesthesiology

## 2011-10-15 ENCOUNTER — Encounter (HOSPITAL_COMMUNITY): Payer: Self-pay | Admitting: *Deleted

## 2011-10-15 ENCOUNTER — Inpatient Hospital Stay (HOSPITAL_COMMUNITY)
Admission: RE | Admit: 2011-10-15 | Discharge: 2011-10-22 | DRG: 220 | Disposition: A | Discharge status: Home / Self Care | Payer: Medicare Other | Source: Ambulatory Visit | Attending: Cardiothoracic Surgery | Admitting: Cardiothoracic Surgery

## 2011-10-15 ENCOUNTER — Other Ambulatory Visit: Payer: Self-pay | Admitting: Cardiothoracic Surgery

## 2011-10-15 ENCOUNTER — Other Ambulatory Visit: Payer: Self-pay

## 2011-10-15 ENCOUNTER — Encounter (HOSPITAL_COMMUNITY)
Admission: RE | Disposition: A | Discharge status: Home / Self Care | Payer: Self-pay | Source: Ambulatory Visit | Attending: Cardiothoracic Surgery

## 2011-10-15 DIAGNOSIS — I350 Nonrheumatic aortic (valve) stenosis: Secondary | ICD-10-CM

## 2011-10-15 DIAGNOSIS — I498 Other specified cardiac arrhythmias: Secondary | ICD-10-CM | POA: Diagnosis not present

## 2011-10-15 DIAGNOSIS — I359 Nonrheumatic aortic valve disorder, unspecified: Secondary | ICD-10-CM

## 2011-10-15 DIAGNOSIS — E8779 Other fluid overload: Secondary | ICD-10-CM | POA: Diagnosis not present

## 2011-10-15 DIAGNOSIS — Z794 Long term (current) use of insulin: Secondary | ICD-10-CM

## 2011-10-15 DIAGNOSIS — I252 Old myocardial infarction: Secondary | ICD-10-CM

## 2011-10-15 DIAGNOSIS — M1990 Unspecified osteoarthritis, unspecified site: Secondary | ICD-10-CM

## 2011-10-15 DIAGNOSIS — D696 Thrombocytopenia, unspecified: Secondary | ICD-10-CM | POA: Diagnosis present

## 2011-10-15 DIAGNOSIS — E119 Type 2 diabetes mellitus without complications: Secondary | ICD-10-CM

## 2011-10-15 DIAGNOSIS — I471 Supraventricular tachycardia, unspecified: Secondary | ICD-10-CM | POA: Diagnosis not present

## 2011-10-15 DIAGNOSIS — I519 Heart disease, unspecified: Secondary | ICD-10-CM | POA: Diagnosis not present

## 2011-10-15 DIAGNOSIS — D62 Acute posthemorrhagic anemia: Secondary | ICD-10-CM | POA: Diagnosis not present

## 2011-10-15 DIAGNOSIS — I10 Essential (primary) hypertension: Secondary | ICD-10-CM | POA: Diagnosis present

## 2011-10-15 DIAGNOSIS — D72829 Elevated white blood cell count, unspecified: Secondary | ICD-10-CM | POA: Diagnosis present

## 2011-10-15 HISTORY — PX: AORTIC VALVE REPLACEMENT: SHX41

## 2011-10-15 LAB — POCT I-STAT 4, (NA,K, GLUC, HGB,HCT)
Glucose, Bld: 111 mg/dL — ABNORMAL HIGH (ref 70–99)
Glucose, Bld: 108 mg/dL — ABNORMAL HIGH (ref 70–99)
Glucose, Bld: 96 mg/dL (ref 70–99)
Glucose, Bld: 123 mg/dL — ABNORMAL HIGH (ref 70–99)
Glucose, Bld: 133 mg/dL — ABNORMAL HIGH (ref 70–99)
Glucose, Bld: 176 mg/dL — ABNORMAL HIGH (ref 70–99)
Glucose, Bld: 168 mg/dL — ABNORMAL HIGH (ref 70–99)
HCT: 39 % (ref 39.0–52.0)
HCT: 36 % — ABNORMAL LOW (ref 39.0–52.0)
HCT: 36 % — ABNORMAL LOW (ref 39.0–52.0)
HCT: 30 % — ABNORMAL LOW (ref 39.0–52.0)
HCT: 29 % — ABNORMAL LOW (ref 39.0–52.0)
HCT: 30 % — ABNORMAL LOW (ref 39.0–52.0)
HCT: 38 % — ABNORMAL LOW (ref 39.0–52.0)
Hemoglobin: 13.3 g/dL (ref 13.0–17.0)
Hemoglobin: 12.2 g/dL — ABNORMAL LOW (ref 13.0–17.0)
Hemoglobin: 12.2 g/dL — ABNORMAL LOW (ref 13.0–17.0)
Hemoglobin: 10.2 g/dL — ABNORMAL LOW (ref 13.0–17.0)
Hemoglobin: 10.2 g/dL — ABNORMAL LOW (ref 13.0–17.0)
Hemoglobin: 9.9 g/dL — ABNORMAL LOW (ref 13.0–17.0)
Hemoglobin: 12.9 g/dL — ABNORMAL LOW (ref 13.0–17.0)
Potassium: 4 mEq/L (ref 3.5–5.1)
Potassium: 4 mEq/L (ref 3.5–5.1)
Potassium: 4 mEq/L (ref 3.5–5.1)
Potassium: 4.1 mEq/L (ref 3.5–5.1)
Potassium: 3.7 mEq/L (ref 3.5–5.1)
Potassium: 4.4 mEq/L (ref 3.5–5.1)
Potassium: 3.9 mEq/L (ref 3.5–5.1)
Sodium: 144 mEq/L (ref 135–145)
Sodium: 134 mEq/L — ABNORMAL LOW (ref 135–145)
Sodium: 142 mEq/L (ref 135–145)
Sodium: 139 mEq/L (ref 135–145)
Sodium: 140 mEq/L (ref 135–145)
Sodium: 142 mEq/L (ref 135–145)
Sodium: 145 mEq/L (ref 135–145)

## 2011-10-15 LAB — POCT I-STAT 3, ART BLOOD GAS (G3+)
Acid-base deficit: 2 mmol/L (ref 0.0–2.0)
Acid-base deficit: 3 mmol/L — ABNORMAL HIGH (ref 0.0–2.0)
Acid-base deficit: 2 mmol/L (ref 0.0–2.0)
Acid-base deficit: 2 mmol/L (ref 0.0–2.0)
Bicarbonate: 26.9 mEq/L — ABNORMAL HIGH (ref 20.0–24.0)
Bicarbonate: 23.1 mEq/L (ref 20.0–24.0)
Bicarbonate: 23 mEq/L (ref 20.0–24.0)
Bicarbonate: 23 mEq/L (ref 20.0–24.0)
Bicarbonate: 23.6 mEq/L (ref 20.0–24.0)
O2 Saturation: 100 %
O2 Saturation: 100 %
O2 Saturation: 95 %
O2 Saturation: 97 %
O2 Saturation: 93 %
Patient temperature: 35.9
Patient temperature: 36.5
Patient temperature: 36.7
TCO2: 28 mmol/L (ref 0–100)
TCO2: 24 mmol/L (ref 0–100)
TCO2: 24 mmol/L (ref 0–100)
TCO2: 24 mmol/L (ref 0–100)
TCO2: 25 mmol/L (ref 0–100)
pCO2 arterial: 50.3 mmHg — ABNORMAL HIGH (ref 35.0–45.0)
pCO2 arterial: 38.3 mmHg (ref 35.0–45.0)
pCO2 arterial: 43.8 mmHg (ref 35.0–45.0)
pCO2 arterial: 38.4 mmHg (ref 35.0–45.0)
pCO2 arterial: 40.9 mmHg (ref 35.0–45.0)
pH, Arterial: 7.337 — ABNORMAL LOW (ref 7.350–7.450)
pH, Arterial: 7.388 (ref 7.350–7.450)
pH, Arterial: 7.323 — ABNORMAL LOW (ref 7.350–7.450)
pH, Arterial: 7.383 (ref 7.350–7.450)
pH, Arterial: 7.367 (ref 7.350–7.450)
pO2, Arterial: 296 mmHg — ABNORMAL HIGH (ref 80.0–100.0)
pO2, Arterial: 468 mmHg — ABNORMAL HIGH (ref 80.0–100.0)
pO2, Arterial: 75 mmHg — ABNORMAL LOW (ref 80.0–100.0)
pO2, Arterial: 89 mmHg (ref 80.0–100.0)
pO2, Arterial: 68 mmHg — ABNORMAL LOW (ref 80.0–100.0)

## 2011-10-15 LAB — CBC
HCT: 33.7 % — ABNORMAL LOW (ref 39.0–52.0)
Hemoglobin: 10.8 g/dL — ABNORMAL LOW (ref 13.0–17.0)
MCH: 27.8 pg (ref 26.0–34.0)
MCHC: 32 g/dL (ref 30.0–36.0)
MCV: 86.9 fL (ref 78.0–100.0)
Platelets: 125 10*3/uL — ABNORMAL LOW (ref 150–400)
RBC: 3.88 MIL/uL — ABNORMAL LOW (ref 4.22–5.81)
RDW: 18.5 % — ABNORMAL HIGH (ref 11.5–15.5)
WBC: 14.1 10*3/uL — ABNORMAL HIGH (ref 4.0–10.5)

## 2011-10-15 LAB — POCT I-STAT 3, VENOUS BLOOD GAS (G3P V)
Acid-base deficit: 2 mmol/L (ref 0.0–2.0)
Bicarbonate: 22.8 mEq/L (ref 20.0–24.0)
O2 Saturation: 83 %
TCO2: 24 mmol/L (ref 0–100)
pCO2, Ven: 38.2 mmHg — ABNORMAL LOW (ref 45.0–50.0)
pH, Ven: 7.384 — ABNORMAL HIGH (ref 7.250–7.300)
pO2, Ven: 48 mmHg — ABNORMAL HIGH (ref 30.0–45.0)

## 2011-10-15 LAB — HEMOGLOBIN AND HEMATOCRIT, BLOOD
HCT: 29.1 % — ABNORMAL LOW (ref 39.0–52.0)
Hemoglobin: 9.1 g/dL — ABNORMAL LOW (ref 13.0–17.0)

## 2011-10-15 LAB — CREATININE, SERUM
Creatinine, Ser: 0.67 mg/dL (ref 0.50–1.35)
GFR calc Af Amer: >90 mL/min (ref >90)
GFR calc non Af Amer: >90 mL/min (ref >90)

## 2011-10-15 LAB — POCT I-STAT, CHEM 8
BUN: 14 mg/dL (ref 6–23)
Calcium, Ion: 1.15 mmol/L (ref 1.12–1.32)
Chloride: 107 mEq/L (ref 96–112)
Creatinine, Ser: 0.7 mg/dL (ref 0.50–1.35)
Glucose, Bld: 137 mg/dL — ABNORMAL HIGH (ref 70–99)
HCT: 35 % — ABNORMAL LOW (ref 39.0–52.0)
Hemoglobin: 11.9 g/dL — ABNORMAL LOW (ref 13.0–17.0)
Potassium: 4.5 mEq/L (ref 3.5–5.1)
Sodium: 144 mEq/L (ref 135–145)
TCO2: 23 mmol/L (ref 0–100)

## 2011-10-15 LAB — GLUCOSE, CAPILLARY
Glucose-Capillary: 160 mg/dL — ABNORMAL HIGH (ref 70–99)
Glucose-Capillary: 136 mg/dL — ABNORMAL HIGH (ref 70–99)
Glucose-Capillary: 128 mg/dL — ABNORMAL HIGH (ref 70–99)
Glucose-Capillary: 137 mg/dL — ABNORMAL HIGH (ref 70–99)
Glucose-Capillary: 146 mg/dL — ABNORMAL HIGH (ref 70–99)
Glucose-Capillary: 137 mg/dL — ABNORMAL HIGH (ref 70–99)
Glucose-Capillary: 140 mg/dL — ABNORMAL HIGH (ref 70–99)
Glucose-Capillary: 129 mg/dL — ABNORMAL HIGH (ref 70–99)
Glucose-Capillary: 130 mg/dL — ABNORMAL HIGH (ref 70–99)

## 2011-10-15 LAB — PLATELET COUNT: Platelets: 142 10*3/uL — ABNORMAL LOW (ref 150–400)

## 2011-10-15 LAB — MAGNESIUM: Magnesium: 2.2 mg/dL (ref 1.5–2.5)

## 2011-10-15 SURGERY — REPLACEMENT, AORTIC VALVE, OPEN
Anesthesia: General | Site: Chest | Wound class: Clean

## 2011-10-15 MED ORDER — MIDAZOLAM HCL 2 MG/2ML IJ SOLN: 2.0000 mg | INTRAMUSCULAR | Status: DC | PRN

## 2011-10-15 MED ORDER — DEXTROSE 5 % IV SOLN
0.7500 g | INTRAVENOUS | Status: DC | PRN
  Administered 2011-10-15: .75 g via INTRAVENOUS

## 2011-10-15 MED ORDER — INSULIN ASPART 100 UNIT/ML ~~LOC~~ SOLN
0.0000 [IU] | SUBCUTANEOUS | Status: DC
  Filled 2011-10-15: qty 3

## 2011-10-15 MED ORDER — PROPOFOL 10 MG/ML IV EMUL
INTRAVENOUS | Status: DC | PRN
  Administered 2011-10-15: 50 mg via INTRAVENOUS
  Administered 2011-10-15: 100 mg via INTRAVENOUS

## 2011-10-15 MED ORDER — ACETAMINOPHEN 650 MG RE SUPP
650.0000 mg | RECTAL | Status: AC
  Administered 2011-10-15: 650 mg via RECTAL

## 2011-10-15 MED ORDER — PANTOPRAZOLE SODIUM 40 MG PO TBEC
40.0000 mg | DELAYED_RELEASE_TABLET | Freq: Every day | ORAL | Status: DC
  Administered 2011-10-17: 40 mg via ORAL
  Filled 2011-10-15: qty 1

## 2011-10-15 MED ORDER — ALBUMIN HUMAN 5 % IV SOLN
INTRAVENOUS | Status: DC | PRN
  Administered 2011-10-15 (×4): via INTRAVENOUS

## 2011-10-15 MED ORDER — NITROGLYCERIN IN D5W 200-5 MCG/ML-% IV SOLN
INTRAVENOUS | Status: DC | PRN
  Administered 2011-10-15: 5 ug/min via INTRAVENOUS

## 2011-10-15 MED ORDER — SODIUM CHLORIDE 0.9 % IJ SOLN: 3.0000 mL | INTRAMUSCULAR | Status: DC | PRN

## 2011-10-15 MED ORDER — ACETAMINOPHEN 160 MG/5ML PO SOLN
975.0000 mg | Freq: Four times a day (QID) | ORAL | Status: DC
  Filled 2011-10-15: qty 40.6

## 2011-10-15 MED ORDER — POTASSIUM CHLORIDE 10 MEQ/50ML IV SOLN
10.0000 meq | INTRAVENOUS | Status: AC
Start: 2011-10-15 — End: 2011-10-15
  Administered 2011-10-15 (×3): 10 meq via INTRAVENOUS

## 2011-10-15 MED ORDER — DEXTROSE 5 % IV SOLN
1.5000 g | Freq: Two times a day (BID) | INTRAVENOUS | Status: DC
  Filled 2011-10-15: qty 1.5

## 2011-10-15 MED ORDER — FENTANYL CITRATE 0.05 MG/ML IJ SOLN
INTRAMUSCULAR | Status: DC | PRN
  Administered 2011-10-15: 150 ug via INTRAVENOUS
  Administered 2011-10-15: 100 ug via INTRAVENOUS
  Administered 2011-10-15: 500 ug via INTRAVENOUS

## 2011-10-15 MED ORDER — POTASSIUM CHLORIDE 10 MEQ/50ML IV SOLN: 10.0000 meq | INTRAVENOUS | Status: DC | PRN

## 2011-10-15 MED ORDER — LACTATED RINGERS IV SOLN
INTRAVENOUS | Status: DC | PRN
  Administered 2011-10-15 (×2): via INTRAVENOUS

## 2011-10-15 MED ORDER — OXYCODONE HCL 5 MG PO TABS
5.0000 mg | ORAL_TABLET | ORAL | Status: DC | PRN
  Administered 2011-10-16: 5 mg via ORAL
  Filled 2011-10-15: qty 2
  Filled 2011-10-15: qty 1

## 2011-10-15 MED ORDER — HEMOSTATIC AGENTS (NO CHARGE) OPTIME
TOPICAL | Status: DC | PRN
  Administered 2011-10-15: 1 via TOPICAL

## 2011-10-15 MED ORDER — DEXTROSE 5 % IV SOLN
1.5000 g | Freq: Two times a day (BID) | INTRAVENOUS | Status: AC
  Administered 2011-10-15 – 2011-10-17 (×4): 1.5 g via INTRAVENOUS
  Filled 2011-10-15 (×4): qty 1.5

## 2011-10-15 MED ORDER — PHENYLEPHRINE HCL 10 MG/ML IJ SOLN
10000.0000 ug | INTRAVENOUS | Status: DC | PRN
  Administered 2011-10-15: 5 ug/min via INTRAVENOUS

## 2011-10-15 MED ORDER — PHENYLEPHRINE HCL 10 MG/ML IJ SOLN
20000.0000 ug | INTRAVENOUS | Status: DC | PRN
  Administered 2011-10-15: 10 ug/min via INTRAVENOUS

## 2011-10-15 MED ORDER — ROCURONIUM BROMIDE 100 MG/10ML IV SOLN
INTRAVENOUS | Status: DC | PRN
  Administered 2011-10-15: 60 mg via INTRAVENOUS
  Administered 2011-10-15: 40 mg via INTRAVENOUS

## 2011-10-15 MED ORDER — VITAMIN B-12 1000 MCG PO TABS
1000.0000 ug | ORAL_TABLET | Freq: Every day | ORAL | Status: DC
  Administered 2011-10-16 – 2011-10-22 (×7): 1000 ug via ORAL
  Filled 2011-10-15 (×8): qty 1

## 2011-10-15 MED ORDER — SODIUM CHLORIDE 0.9 % IV SOLN: INTRAVENOUS | Status: DC

## 2011-10-15 MED ORDER — DOCUSATE SODIUM 100 MG PO CAPS
200.0000 mg | ORAL_CAPSULE | Freq: Every day | ORAL | Status: DC
  Administered 2011-10-16 – 2011-10-18 (×3): 200 mg via ORAL
  Filled 2011-10-15 (×3): qty 2

## 2011-10-15 MED ORDER — BISACODYL 10 MG RE SUPP: 10.0000 mg | Freq: Every day | RECTAL | Status: DC

## 2011-10-15 MED ORDER — SODIUM CHLORIDE 0.9 % IV SOLN
INTRAVENOUS | Status: DC
  Filled 2011-10-15: qty 1

## 2011-10-15 MED ORDER — SODIUM CHLORIDE 0.9 % IV SOLN
5.0000 g | Freq: Once | INTRAVENOUS | Status: AC
  Administered 2011-10-15: 1 g via INTRAVENOUS
  Filled 2011-10-15: qty 20

## 2011-10-15 MED ORDER — VANCOMYCIN HCL 1000 MG IV SOLR
1000.0000 mg | Freq: Two times a day (BID) | INTRAVENOUS | Status: AC
  Administered 2011-10-16 – 2011-10-17 (×4): 1000 mg via INTRAVENOUS
  Filled 2011-10-15 (×5): qty 1000

## 2011-10-15 MED ORDER — SODIUM CHLORIDE 0.45 % IV SOLN
INTRAVENOUS | Status: DC
  Administered 2011-10-16: via INTRAVENOUS

## 2011-10-15 MED ORDER — INSULIN GLARGINE 100 UNIT/ML ~~LOC~~ SOLN
20.0000 [IU] | SUBCUTANEOUS | Status: DC
  Filled 2011-10-15 (×2): qty 3

## 2011-10-15 MED ORDER — ASPIRIN EC 325 MG PO TBEC
325.0000 mg | DELAYED_RELEASE_TABLET | Freq: Every day | ORAL | Status: DC
  Administered 2011-10-16 – 2011-10-18 (×3): 325 mg via ORAL
  Filled 2011-10-15 (×3): qty 1

## 2011-10-15 MED ORDER — DEXMEDETOMIDINE HCL 100 MCG/ML IV SOLN
0.1000 ug/kg/h | INTRAVENOUS | Status: DC
  Administered 2011-10-15: 0.5 ug/kg/h via INTRAVENOUS
  Filled 2011-10-15: qty 2

## 2011-10-15 MED ORDER — GLIMEPIRIDE 4 MG PO TABS
4.0000 mg | ORAL_TABLET | Freq: Every day | ORAL | Status: DC
  Administered 2011-10-16 – 2011-10-22 (×7): 4 mg via ORAL
  Filled 2011-10-15 (×8): qty 1

## 2011-10-15 MED ORDER — ASPIRIN 81 MG PO CHEW
324.0000 mg | CHEWABLE_TABLET | Freq: Every day | ORAL | Status: DC
  Filled 2011-10-15: qty 1

## 2011-10-15 MED ORDER — DEXTROSE 5 % IV SOLN
1.5000 g | INTRAVENOUS | Status: DC | PRN
  Administered 2011-10-15: 1.5 g via INTRAVENOUS

## 2011-10-15 MED ORDER — SODIUM CHLORIDE 0.9 % IV SOLN: 250.0000 mL | INTRAVENOUS | Status: DC

## 2011-10-15 MED ORDER — SODIUM CHLORIDE 0.9 % IV SOLN
INTRAVENOUS | Status: DC | PRN
  Administered 2011-10-15: 12:00:00 via INTRAVENOUS

## 2011-10-15 MED ORDER — PHENYLEPHRINE HCL 10 MG/ML IJ SOLN
0.0000 ug/min | INTRAVENOUS | Status: DC
  Administered 2011-10-16: 15 ug/min via INTRAVENOUS
  Filled 2011-10-15: qty 2

## 2011-10-15 MED ORDER — SODIUM CHLORIDE 0.9 % IJ SOLN
3.0000 mL | Freq: Two times a day (BID) | INTRAMUSCULAR | Status: DC
  Administered 2011-10-16 – 2011-10-18 (×5): 3 mL via INTRAVENOUS

## 2011-10-15 MED ORDER — VECURONIUM BROMIDE 10 MG IV SOLR
INTRAVENOUS | Status: DC | PRN
  Administered 2011-10-15 (×3): 5 mg via INTRAVENOUS

## 2011-10-15 MED ORDER — METOPROLOL TARTRATE 1 MG/ML IV SOLN
2.5000 mg | INTRAVENOUS | Status: DC | PRN
  Filled 2011-10-15: qty 5

## 2011-10-15 MED ORDER — METOPROLOL TARTRATE 25 MG/10 ML ORAL SUSPENSION
12.5000 mg | Freq: Two times a day (BID) | ORAL | Status: DC
  Administered 2011-10-16 – 2011-10-18 (×2): 12.5 mg
  Filled 2011-10-15 (×7): qty 5

## 2011-10-15 MED ORDER — BISACODYL 5 MG PO TBEC
10.0000 mg | DELAYED_RELEASE_TABLET | Freq: Every day | ORAL | Status: DC
  Administered 2011-10-16 – 2011-10-18 (×3): 10 mg via ORAL
  Filled 2011-10-15 (×3): qty 2

## 2011-10-15 MED ORDER — INSULIN ASPART 100 UNIT/ML ~~LOC~~ SOLN
0.0000 [IU] | SUBCUTANEOUS | Status: AC
  Filled 2011-10-15: qty 3

## 2011-10-15 MED ORDER — DOPAMINE-DEXTROSE 3.2-5 MG/ML-% IV SOLN: 0.0000 ug/kg/min | INTRAVENOUS | Status: DC

## 2011-10-15 MED ORDER — METOPROLOL TARTRATE 12.5 MG HALF TABLET
12.5000 mg | ORAL_TABLET | Freq: Two times a day (BID) | ORAL | Status: DC
  Administered 2011-10-16 – 2011-10-17 (×3): 12.5 mg via ORAL
  Filled 2011-10-15 (×7): qty 1

## 2011-10-15 MED ORDER — HEPARIN SODIUM (PORCINE) 1000 UNIT/ML IJ SOLN
INTRAMUSCULAR | Status: DC | PRN
  Administered 2011-10-15: 51000 [IU] via INTRAVENOUS

## 2011-10-15 MED ORDER — DOPAMINE-DEXTROSE 3.2-5 MG/ML-% IV SOLN
INTRAVENOUS | Status: DC | PRN
  Administered 2011-10-15: 3 ug/kg/min via INTRAVENOUS

## 2011-10-15 MED ORDER — VANCOMYCIN HCL 1000 MG IV SOLR
1000.0000 mg | Freq: Two times a day (BID) | INTRAVENOUS | Status: DC
  Filled 2011-10-15: qty 1000

## 2011-10-15 MED ORDER — LACTATED RINGERS IV SOLN: 500.0000 mL | Freq: Once | INTRAVENOUS | Status: AC | PRN

## 2011-10-15 MED ORDER — FAMOTIDINE IN NACL 20-0.9 MG/50ML-% IV SOLN
20.0000 mg | Freq: Two times a day (BID) | INTRAVENOUS | Status: AC
  Administered 2011-10-15: 20 mg via INTRAVENOUS
  Filled 2011-10-15 (×2): qty 50

## 2011-10-15 MED ORDER — NITROGLYCERIN IN D5W 200-5 MCG/ML-% IV SOLN: 0.0000 ug/min | INTRAVENOUS | Status: DC

## 2011-10-15 MED ORDER — LACTATED RINGERS IV SOLN: INTRAVENOUS | Status: DC

## 2011-10-15 MED ORDER — MORPHINE SULFATE 2 MG/ML IJ SOLN
1.0000 mg | INTRAMUSCULAR | Status: AC | PRN
Start: 2011-10-15 — End: 2011-10-16

## 2011-10-15 MED ORDER — HEMOSTATIC AGENTS (NO CHARGE) OPTIME
TOPICAL | Status: DC | PRN
  Administered 2011-10-15 (×2): 1 via TOPICAL

## 2011-10-15 MED ORDER — MAGNESIUM SULFATE 50 % IJ SOLN
4.0000 g | Freq: Once | INTRAVENOUS | Status: AC
  Administered 2011-10-15: 4 g via INTRAVENOUS
  Filled 2011-10-15: qty 8

## 2011-10-15 MED ORDER — SODIUM CHLORIDE 0.9 % IV SOLN
100.0000 [IU] | INTRAVENOUS | Status: DC | PRN
  Administered 2011-10-15: 1.5 [IU]/h via INTRAVENOUS

## 2011-10-15 MED ORDER — PROTAMINE SULFATE 10 MG/ML IV SOLN
INTRAVENOUS | Status: DC | PRN
  Administered 2011-10-15: 390 mg via INTRAVENOUS
  Administered 2011-10-15: 10 mg via INTRAVENOUS
  Administered 2011-10-15: 50 mg via INTRAVENOUS

## 2011-10-15 MED ORDER — SODIUM CHLORIDE 0.9 % IR SOLN
Status: DC | PRN
  Administered 2011-10-15: 3000 mL

## 2011-10-15 MED ORDER — DOPAMINE-DEXTROSE 3.2-5 MG/ML-% IV SOLN: 2.5000 ug/kg/min | INTRAVENOUS | Status: DC

## 2011-10-15 MED ORDER — SODIUM CHLORIDE 0.9 % IV SOLN
200.0000 ug | INTRAVENOUS | Status: DC | PRN
  Administered 2011-10-15: 0.2 ug/kg/h via INTRAVENOUS

## 2011-10-15 MED ORDER — MIDAZOLAM HCL 5 MG/5ML IJ SOLN
INTRAMUSCULAR | Status: DC | PRN
  Administered 2011-10-15 (×2): 3 mg via INTRAVENOUS
  Administered 2011-10-15: 5 mg via INTRAVENOUS
  Administered 2011-10-15 (×2): 2 mg via INTRAVENOUS
  Administered 2011-10-15: 5 mg via INTRAVENOUS

## 2011-10-15 MED ORDER — ACETAMINOPHEN 500 MG PO TABS
1000.0000 mg | ORAL_TABLET | Freq: Four times a day (QID) | ORAL | Status: DC
  Administered 2011-10-16 – 2011-10-18 (×9): 1000 mg via ORAL
  Filled 2011-10-15 (×15): qty 2

## 2011-10-15 MED ORDER — ALBUMIN HUMAN 5 % IV SOLN
250.0000 mL | INTRAVENOUS | Status: DC | PRN
  Administered 2011-10-15 (×2): 250 mL via INTRAVENOUS

## 2011-10-15 MED ORDER — ONDANSETRON HCL 4 MG/2ML IJ SOLN: 4.0000 mg | Freq: Four times a day (QID) | INTRAMUSCULAR | Status: DC | PRN

## 2011-10-15 MED ORDER — VANCOMYCIN HCL 1000 MG IV SOLR
1500.0000 mg | INTRAVENOUS | Status: DC | PRN
  Administered 2011-10-15: 1500 g via INTRAVENOUS

## 2011-10-15 MED ORDER — SODIUM CHLORIDE 0.9 % IJ SOLN: 10.0000 mL | INTRAMUSCULAR | Status: DC | PRN

## 2011-10-15 MED ORDER — INSULIN REGULAR HUMAN 100 UNIT/ML IJ SOLN
INTRAMUSCULAR | Status: DC | PRN
  Filled 2011-10-15: qty 1

## 2011-10-15 MED ORDER — ACETAMINOPHEN 160 MG/5ML PO SOLN: 650.0000 mg | ORAL | Status: AC

## 2011-10-15 MED ORDER — MORPHINE SULFATE 4 MG/ML IJ SOLN
2.0000 mg | INTRAMUSCULAR | Status: DC | PRN
  Administered 2011-10-15: 4 mg via INTRAVENOUS
  Administered 2011-10-15: 2 mg via INTRAVENOUS
  Administered 2011-10-15: 4 mg via INTRAVENOUS
  Administered 2011-10-15: 2 mg via INTRAVENOUS
  Administered 2011-10-16 (×3): 4 mg via INTRAVENOUS
  Filled 2011-10-15 (×6): qty 1

## 2011-10-15 MED ORDER — EZETIMIBE-SIMVASTATIN 10-40 MG PO TABS
1.0000 | ORAL_TABLET | Freq: Every day | ORAL | Status: DC
  Administered 2011-10-16 – 2011-10-17 (×2): 1 via ORAL
  Filled 2011-10-15 (×5): qty 1

## 2011-10-15 MED ORDER — SODIUM CHLORIDE 0.9 % IV SOLN
10.0000 g | INTRAVENOUS | Status: DC | PRN
  Administered 2011-10-15: 5 g/h via INTRAVENOUS

## 2011-10-15 MED ORDER — HEMOSTATIC AGENTS (NO CHARGE) OPTIME
TOPICAL | Status: DC | PRN
  Administered 2011-10-15: 2 via TOPICAL

## 2011-10-15 SURGICAL SUPPLY — 85 items
ADAPTER CARDIO PERF ANTE/RETRO (ADAPTER) ×3 IMPLANT
ATTRACTOMAT 16X20 MAGNETIC DRP (DRAPES) ×3 IMPLANT
BAG DECANTER FOR FLEXI CONT (MISCELLANEOUS) ×3 IMPLANT
BLADE SAW STERNAL (BLADE) ×3 IMPLANT
BLADE SURG 15 STRL LF DISP TIS (BLADE) ×1 IMPLANT
BLADE SURG 15 STRL SS (BLADE) ×2
CANISTER SUCTION 2500CC (MISCELLANEOUS) ×3 IMPLANT
CANNULA GUNDRY RCSP 15FR (MISCELLANEOUS) ×3 IMPLANT
CATH RETROPLEGIA CORONARY 14FR (CATHETERS) IMPLANT
CATH ROBINSON RED A/P 18FR (CATHETERS) ×3 IMPLANT
CLIP FOGARTY SPRING 6M (CLIP) IMPLANT
CLOTH BEACON ORANGE TIMEOUT ST (SAFETY) ×3 IMPLANT
CONT SPEC STER OR (MISCELLANEOUS) ×3 IMPLANT
COVER SURGICAL LIGHT HANDLE (MISCELLANEOUS) ×6 IMPLANT
CRADLE DONUT ADULT HEAD (MISCELLANEOUS) ×3 IMPLANT
DRAPE CARDIOVASCULAR INCISE (DRAPES) ×2
DRAPE SLUSH MACHINE 52X66 (DRAPES) ×3 IMPLANT
DRAPE SLUSH/WARMER DISC (DRAPES) IMPLANT
DRAPE SRG 135X102X78XABS (DRAPES) ×1 IMPLANT
DRSG COVADERM 4X14 (GAUZE/BANDAGES/DRESSINGS) ×3 IMPLANT
DRSG TELFA 4X14 ISLAND ADH (GAUZE/BANDAGES/DRESSINGS) ×3 IMPLANT
ELECT CAUTERY BLADE 6.4 (BLADE) ×3 IMPLANT
ELECT REM PT RETURN 9FT ADLT (ELECTROSURGICAL) ×6
ELECTRODE REM PT RTRN 9FT ADLT (ELECTROSURGICAL) ×2 IMPLANT
GAUZE SPONGE 4X4 12PLY STRL LF (GAUZE/BANDAGES/DRESSINGS) ×3 IMPLANT
GOWN STRL NON-REIN LRG LVL3 (GOWN DISPOSABLE) ×18 IMPLANT
HEART VENT LT CURVED (MISCELLANEOUS) ×3 IMPLANT
HEMOSTAT POWDER SURGIFOAM 1G (HEMOSTASIS) ×9 IMPLANT
HEMOSTAT SURGICEL 2X14 (HEMOSTASIS) ×3 IMPLANT
INSERT FOGARTY XLG (MISCELLANEOUS) IMPLANT
KIT BASIN OR (CUSTOM PROCEDURE TRAY) ×3 IMPLANT
KIT PAIN CUSTOM (MISCELLANEOUS) IMPLANT
KIT ROOM TURNOVER OR (KITS) ×3 IMPLANT
KIT SUCTION CATH 14FR (SUCTIONS) ×3 IMPLANT
LINE VENT (MISCELLANEOUS) ×3 IMPLANT
MARKER GRAFT CORONARY BYPASS (MISCELLANEOUS) IMPLANT
MATRIX HEMOSTAT SURGIFLO (HEMOSTASIS) ×9 IMPLANT
NS IRRIG 1000ML POUR BTL (IV SOLUTION) ×21 IMPLANT
PACK OPEN HEART (CUSTOM PROCEDURE TRAY) ×3 IMPLANT
PAD ARMBOARD 7.5X6 YLW CONV (MISCELLANEOUS) ×9 IMPLANT
PENCIL BUTTON HOLSTER BLD 10FT (ELECTRODE) IMPLANT
SET CARDIOPLEGIA MPS 5001102 (MISCELLANEOUS) ×3 IMPLANT
SPONGE GAUZE 4X4 12PLY (GAUZE/BANDAGES/DRESSINGS) ×6 IMPLANT
SUT ETHIBON 2 0 V 52N 30 (SUTURE) ×6 IMPLANT
SUT ETHIBON EXCEL 2-0 V-5 (SUTURE) IMPLANT
SUT ETHIBOND 2 0 SH (SUTURE) ×2
SUT ETHIBOND 2 0 SH 36X2 (SUTURE) ×1 IMPLANT
SUT ETHIBOND 2 0 V4 (SUTURE) IMPLANT
SUT ETHIBOND 2 0V4 GREEN (SUTURE) IMPLANT
SUT ETHIBOND 4 0 RB 1 (SUTURE) IMPLANT
SUT ETHIBOND V-5 VALVE (SUTURE) ×3 IMPLANT
SUT PROLENE 3 0 SH 1 (SUTURE) IMPLANT
SUT PROLENE 3 0 SH DA (SUTURE) ×9 IMPLANT
SUT PROLENE 4 0 RB 1 (SUTURE) ×10
SUT PROLENE 4 0 SH DA (SUTURE) ×9 IMPLANT
SUT PROLENE 4-0 RB1 .5 CRCL 36 (SUTURE) ×5 IMPLANT
SUT PROLENE 5 0 C 1 36 (SUTURE) ×6 IMPLANT
SUT SILK  1 MH (SUTURE) ×4
SUT SILK 1 MH (SUTURE) ×2 IMPLANT
SUT SILK 1 TIES 10X30 (SUTURE) ×3 IMPLANT
SUT SILK 2 0 (SUTURE) ×2
SUT SILK 2 0 SH CR/8 (SUTURE) ×6 IMPLANT
SUT SILK 2-0 18XBRD TIE 12 (SUTURE) ×1 IMPLANT
SUT SILK 3 0 SH CR/8 (SUTURE) ×3 IMPLANT
SUT SILK 4 0 (SUTURE) ×2
SUT SILK 4-0 18XBRD TIE 12 (SUTURE) ×1 IMPLANT
SUT STEEL 6MS V (SUTURE) ×6 IMPLANT
SUT STEEL STERNAL CCS#1 18IN (SUTURE) ×3 IMPLANT
SUT STEEL SZ 6 DBL 3X14 BALL (SUTURE) ×3 IMPLANT
SUT TEM PAC WIRE 2 0 SH (SUTURE) ×12 IMPLANT
SUT VIC AB 1 CTX 18 (SUTURE) ×3 IMPLANT
SUT VIC AB 1 CTX 36 (SUTURE)
SUT VIC AB 1 CTX36XBRD ANBCTR (SUTURE) IMPLANT
SUT VIC AB 2-0 CTX 27 (SUTURE) ×15 IMPLANT
SUT VIC AB 3-0 X1 27 (SUTURE) IMPLANT
SYR 10ML KIT SKIN ADHESIVE (MISCELLANEOUS) IMPLANT
SYSTEM SAHARA CHEST DRAIN ATS (WOUND CARE) ×3 IMPLANT
TAPE CLOTH SURG 4X10 WHT LF (GAUZE/BANDAGES/DRESSINGS) ×3 IMPLANT
TOWEL OR 17X24 6PK STRL BLUE (TOWEL DISPOSABLE) ×6 IMPLANT
TOWEL OR 17X26 10 PK STRL BLUE (TOWEL DISPOSABLE) ×6 IMPLANT
TRAY FOLEY IC TEMP SENS 14FR (CATHETERS) ×3 IMPLANT
TRAY FOLEY IC TEMP SENS 16FR (CATHETERS) IMPLANT
UNDERPAD 30X30 INCONTINENT (UNDERPADS AND DIAPERS) ×3 IMPLANT
VALVE MAGNA EASE AORTIC 23MM (Prosthesis & Implant Heart) ×3 IMPLANT
WATER STERILE IRR 1000ML POUR (IV SOLUTION) ×6 IMPLANT

## 2011-10-15 NOTE — Anesthesia Postprocedure Evaluation (Signed)
  Anesthesia Post-op Note  Patient: Casey Gilmore  Procedure(s) Performed:  AORTIC VALVE REPLACEMENT (AVR)  Patient Location: PACU and ICU  Anesthesia Type: General  Level of Consciousness: Patient remains intubated per anesthesia plan  Airway and Oxygen Therapy: Patient remains intubated per anesthesia plan and Patient placed on Ventilator (see vital sign flow sheet for setting)  Post-op Pain: none  Post-op Assessment: Post-op Vital signs reviewed, Patient's Cardiovascular Status Stable, Respiratory Function Stable, Patent Airway, No signs of Nausea or vomiting and Pain level controlled  Post-op Vital Signs: Reviewed and stable  Complications: No apparent anesthesia complications

## 2011-10-15 NOTE — Anesthesia Procedure Notes (Addendum)
Performed by: Ivin Booty, DAVID A   Procedure Name: Intubation Date/Time: 10/15/2011 8:00 AM Performed by: Romie Minus Pre-anesthesia Checklist: Patient identified, Emergency Drugs available, Suction available and Patient being monitored Patient Re-evaluated:Patient Re-evaluated prior to inductionOxygen Delivery Method: Circle System Utilized Preoxygenation: Pre-oxygenation with 100% oxygen Intubation Type: IV induction Ventilation: Mask ventilation without difficulty and Oral airway inserted - appropriate to patient size Laryngoscope Size: Mac and 3 Grade View: Grade I Tube type: Oral Tube size: 8.0 mm Number of attempts: 1 Secured at: 22 cm Tube secured with: Tape Dental Injury: Teeth and Oropharynx as per pre-operative assessment

## 2011-10-15 NOTE — Anesthesia Preprocedure Evaluation (Addendum)
Anesthesia Evaluation  Patient identified by MRN, date of birth, ID band Patient awake    Reviewed: Allergy & Precautions, H&P , NPO status , Patient's Chart, lab work & pertinent test results, reviewed documented beta blocker date and time   Airway Mallampati: II TM Distance: >3 FB Neck ROM: Full  Mouth opening: Limited Mouth Opening  Dental  (+) Edentulous Upper, Edentulous Lower and Dental Advisory Given   Pulmonary shortness of breath and with exertion, pneumonia ,  clear to auscultation  + decreased breath sounds      Cardiovascular Exercise Tolerance: Poor hypertension (pt received home beta blocker in short stay, per short stay RN. Unable to locate in charting.), Pt. on medications and Pt. on home beta blockers + Past MI and + Peripheral Vascular Disease IIIRegular Normal+ Systolic murmurs EF 60-65%   Neuro/Psych  Neuromuscular disease    GI/Hepatic Neg liver ROS, hiatal hernia,   Endo/Other  Diabetes mellitus-, Type 2, Oral Hypoglycemic Agents  Renal/GU negative Renal ROS     Musculoskeletal   Abdominal   Peds  Hematology negative hematology ROS (+)   Anesthesia Other Findings   Reproductive/Obstetrics                        Anesthesia Physical Anesthesia Plan  ASA: IV  Anesthesia Plan: General   Post-op Pain Management:    Induction: Intravenous  Airway Management Planned: Oral ETT  Additional Equipment: Arterial line, TEE, PA Cath, CVP and Ultrasound Guidance Line Placement  Intra-op Plan:   Post-operative Plan: Post-operative intubation/ventilation  Informed Consent: I have reviewed the patients History and Physical, chart, labs and discussed the procedure including the risks, benefits and alternatives for the proposed anesthesia with the patient or authorized representative who has indicated his/her understanding and acceptance.   Dental advisory given  Plan Discussed with:  CRNA and Surgeon  Anesthesia Plan Comments:         Anesthesia Quick Evaluation

## 2011-10-15 NOTE — Brief Op Note (Signed)
10/15/2011  12:28 PM  PATIENT:  Awilda Metro  70 y.o. male  PRE-OPERATIVE DIAGNOSIS:  aortic stenosis  POST-OPERATIVE DIAGNOSIS:  Aortic valve stenosis  PROCEDURE:  Procedure(s): AORTIC VALVE REPLACEMENT (AVR)  SURGEON:  Surgeon(s): Kerin Perna III, MD  PHYSICIAN ASSISTANT:   ASSISTANTS: Wynona Meals PA   ANESTHESIA:   general  EBL:  Total I/O In: 4962 [I.V.:3000; Blood:1212; IV Piggyback:750] Out: 1500 [Urine:950; Blood:550]  BLOOD ADMINISTERED:none and 400 CC CELLSAVER  DRAINS: 1 Chest Tube(s) in the mediastinum   LOCAL MEDICATIONS USED:  NONE  SPECIMEN:  Source of Specimen:  aortic valve  DISPOSITION OF SPECIMEN:  PATHOLOGY  COUNTS:  YES  TOURNIQUET:  * No tourniquets in log *  DICTATION: .Dragon Dictation  PLAN OF CARE: Admit to inpatient   PATIENT DISPOSITION:  ICU - intubated and critically ill.   Delay start of Pharmacological VTE agent (>24hrs) due to surgical blood loss or risk of bleeding:  {YES/NO/NOT APPLICABLE:20182

## 2011-10-15 NOTE — Preoperative (Signed)
Beta Blockers   Reason not to administer Beta Blockers:Not Applicable 

## 2011-10-15 NOTE — Transfer of Care (Signed)
Immediate Anesthesia Transfer of Care Note  Patient: Casey Gilmore  Procedure(s) Performed:  AORTIC VALVE REPLACEMENT (AVR)  Patient Location: PACU and SICU  Anesthesia Type: General  Level of Consciousness: sedated and unresponsive  Airway & Oxygen Therapy: Patient remains intubated per anesthesia plan and Patient placed on Ventilator (see vital sign flow sheet for setting)  Post-op Assessment: Post -op Vital signs reviewed and stable and report to SICU RN  Post vital signs: stable  Complications: No apparent anesthesia complications

## 2011-10-15 NOTE — Anesthesia Postprocedure Evaluation (Signed)
  Anesthesia Post-op Note  Patient: Casey Gilmore  Procedure(s) Performed:  AORTIC VALVE REPLACEMENT (AVR)  Patient Location: ICU  Anesthesia Type: General  Level of Consciousness: sedated  Airway and Oxygen Therapy: Patient remains intubated per anesthesia plan and Patient placed on Ventilator (see vital sign flow sheet for setting)  Post-op Pain: none  Post-op Assessment: Post-op Vital signs reviewed, Patient's Cardiovascular Status Stable, Respiratory Function Stable, Patent Airway and No signs of Nausea or vomiting  Post-op Vital Signs: Reviewed and stable  Complications: No apparent anesthesia complications

## 2011-10-15 NOTE — Progress Notes (Signed)
  Patient examined today in preop and records reviewed. No change from admission H&P.          Sharene Butters Trigt,MD

## 2011-10-15 NOTE — Procedures (Addendum)
Extubation Procedure Note  Patient Details:   Name: Casey Gilmore DOB: 1941/06/24 MRN: [id number]   Airway Documentation:  Extubated at 1835.  Evaluation  O2 sats: stable throughout Complications: No apparent complications Patient did tolerate procedure well. Bilateral Breath Sounds: Diminished Suctioning: Oral (sm thick yellow) Yes pt able to vocalize and No stridor noted.  Extubated @1835  and placed on 4L [place].   Loyal Jacobson [facility] 10/15/2011, 6:55 PM

## 2011-10-15 NOTE — Op Note (Signed)
PROCEDURE  aortic valve replacement with a 23 mm pericardial tissue valve (Edwarda model 3300TFX)  SURGEON      Kathlee Nations Trigt MD,assist Ronnie Scott SA  [place] AND POST OP DIAGNOSIS    Aortic valve stenosis  ANESTHESIA  General, Dr Ivin Booty  Clinical note    The patient is a 70 year old Caucasian male diabetic who was found to have severe aortic stenosis during a recent hospitalization for an upper GI bleed in August 2012. At that time an echo showed severe aortic stenosis with a valve area of 0.9 and a 50 mm mercury transvalvular gradient. There is no significant coronary disease by catheter. The patient now presents for aortic valve replacement after being evaluated in the office. At the office consultation the decision was made to use a tissue valve the to his age of 71 years and is history of GI bleeding. The patient understood the risks of stroke, infection, MI, and death and agree to proceed with surgery underwent I felt was an informed consent   DICTATED NOTE The patient was brought from preop holding to the operating room and placed supine on the operating room table. General anesthesia was induced under invasive hemodynamic monitoring. A transesophageal 2-D echo was placed by the anesthesiologist. The patient was prepped and draped as a sterile field. A proper timeout was performed to confirm proper patient proper site   A sternal incision was made and the sternum retracted. The pericardium was opened and suspended. Heparin was administered and the ACT was documented as being therapeutic pursestring sutures are placed in the ascending  aorta and right atrium the patient was cannulated and placed on cardiopulmonary bypass. The left ventricular vent was placed through the pulmonary vein cardioplegia catheters were placed for antegrade and retrograde delivery the patient was cooled to 32 the aortic cross-clamp was applied and cardioplegia arrest was achieved. Septal temperature dropped to 10.  A  transverse aortotomy was performed. The aortic valve was inspected. It was trileaflet with calcium and fibrosis and severe stenosis. The valve was excised and the annulus debrided of calcium. The outflow tract was irrigated with cold saline. The annulus was sized to a 23 mm valve sizer. The Edwards pericardial valve was prepared according to protocol. 15 subannular horizontal mattress sutures of 2-0 Ethibond were placed around the annulus and then through the valve sewing ring. The valve was seated and sutures were tied. There is good confirmation of the valve to the annulus without space or obstruction of the coronary ostia.   The aortotomy was closed with a running 4-0 Prolene in 2 layers. Air was vented from the coronaries with a dose of retrograde warm blood cardioplegia. De-airing maneuvers were completed with a headdown position. The cross-clamp was removed.   The heart resumed a spontaneous rhythm. The aortotomy was hemostatic. Y. for applied. The patient was rewarmed to 37. The lungs were expanded. The patient was weaned off cardioplegia bypass without difficulty. The echo showed normal functioning valve without leak and good LV global function.   Protamine was administered without adverse reaction. The cannulas were removed and hemostasis was documented. The superior pericardium was closed. Anterior and posterior mediastinal chest tubes were placed. The patient remained stable.   Sternal wires were placed and the chest closed. The patient remained stable. The fascia was closed with a running #1 Vicryl. The subcutaneous and skin layers were closed a running Vicryl. Sterile dressings were applied Total bypass time was 91 minutes The patient returned to the ICU in  stable condition.

## 2011-10-16 ENCOUNTER — Inpatient Hospital Stay (HOSPITAL_COMMUNITY): Payer: Medicare Other

## 2011-10-16 LAB — CBC
HCT: 33.4 % — ABNORMAL LOW (ref 39.0–52.0)
HCT: 31.6 % — ABNORMAL LOW (ref 39.0–52.0)
Hemoglobin: 10.5 g/dL — ABNORMAL LOW (ref 13.0–17.0)
Hemoglobin: 9.9 g/dL — ABNORMAL LOW (ref 13.0–17.0)
MCH: 27.6 pg (ref 26.0–34.0)
MCH: 27.8 pg (ref 26.0–34.0)
MCHC: 31.4 g/dL (ref 30.0–36.0)
MCHC: 31.3 g/dL (ref 30.0–36.0)
MCV: 87.7 fL (ref 78.0–100.0)
MCV: 88.8 fL (ref 78.0–100.0)
Platelets: 107 10*3/uL — ABNORMAL LOW (ref 150–400)
Platelets: 79 10*3/uL — ABNORMAL LOW (ref 150–400)
RBC: 3.81 MIL/uL — ABNORMAL LOW (ref 4.22–5.81)
RBC: 3.56 MIL/uL — ABNORMAL LOW (ref 4.22–5.81)
RDW: 18.6 % — ABNORMAL HIGH (ref 11.5–15.5)
RDW: 18.9 % — ABNORMAL HIGH (ref 11.5–15.5)
WBC: 14.2 10*3/uL — ABNORMAL HIGH (ref 4.0–10.5)
WBC: 13.9 10*3/uL — ABNORMAL HIGH (ref 4.0–10.5)

## 2011-10-16 LAB — BASIC METABOLIC PANEL
BUN: 15 mg/dL (ref 6–23)
CO2: 25 mEq/L (ref 19–32)
Calcium: 8.6 mg/dL (ref 8.4–10.5)
Chloride: 108 mEq/L (ref 96–112)
Creatinine, Ser: 0.73 mg/dL (ref 0.50–1.35)
GFR calc Af Amer: >90 mL/min (ref >90)
GFR calc non Af Amer: >90 mL/min (ref >90)
Glucose, Bld: 111 mg/dL — ABNORMAL HIGH (ref 70–99)
Potassium: 4.2 mEq/L (ref 3.5–5.1)
Sodium: 141 mEq/L (ref 135–145)

## 2011-10-16 LAB — POCT I-STAT, CHEM 8
BUN: 23 mg/dL (ref 6–23)
Calcium, Ion: 1.21 mmol/L (ref 1.12–1.32)
Chloride: 104 mEq/L (ref 96–112)
Creatinine, Ser: 1.5 mg/dL — ABNORMAL HIGH (ref 0.50–1.35)
Glucose, Bld: 172 mg/dL — ABNORMAL HIGH (ref 70–99)
HCT: 31 % — ABNORMAL LOW (ref 39.0–52.0)
Hemoglobin: 10.5 g/dL — ABNORMAL LOW (ref 13.0–17.0)
Potassium: 4.4 mEq/L (ref 3.5–5.1)
Sodium: 140 mEq/L (ref 135–145)
TCO2: 23 mmol/L (ref 0–100)

## 2011-10-16 LAB — MAGNESIUM
Magnesium: 2.2 mg/dL (ref 1.5–2.5)
Magnesium: 2.1 mg/dL (ref 1.5–2.5)

## 2011-10-16 LAB — GLUCOSE, CAPILLARY
Glucose-Capillary: 111 mg/dL — ABNORMAL HIGH (ref 70–99)
Glucose-Capillary: 115 mg/dL — ABNORMAL HIGH (ref 70–99)
Glucose-Capillary: 119 mg/dL — ABNORMAL HIGH (ref 70–99)
Glucose-Capillary: 148 mg/dL — ABNORMAL HIGH (ref 70–99)
Glucose-Capillary: 161 mg/dL — ABNORMAL HIGH (ref 70–99)
Glucose-Capillary: 166 mg/dL — ABNORMAL HIGH (ref 70–99)
Glucose-Capillary: 195 mg/dL — ABNORMAL HIGH (ref 70–99)

## 2011-10-16 LAB — CREATININE, SERUM
Creatinine, Ser: 1.3 mg/dL (ref 0.50–1.35)
GFR calc Af Amer: 63 mL/min — ABNORMAL LOW (ref >90)
GFR calc non Af Amer: 54 mL/min — ABNORMAL LOW (ref >90)

## 2011-10-16 MED ORDER — INSULIN GLARGINE 100 UNIT/ML ~~LOC~~ SOLN
20.0000 [IU] | SUBCUTANEOUS | Status: DC
  Filled 2011-10-16: qty 3

## 2011-10-16 MED ORDER — INSULIN ASPART 100 UNIT/ML ~~LOC~~ SOLN
0.0000 [IU] | SUBCUTANEOUS | Status: DC
  Administered 2011-10-16: 4 [IU] via SUBCUTANEOUS
  Administered 2011-10-16 (×2): 2 [IU] via SUBCUTANEOUS
  Administered 2011-10-16 – 2011-10-17 (×2): 4 [IU] via SUBCUTANEOUS
  Administered 2011-10-17: 2 [IU] via SUBCUTANEOUS
  Administered 2011-10-17 (×3): 4 [IU] via SUBCUTANEOUS
  Administered 2011-10-18 (×2): 2 [IU] via SUBCUTANEOUS
  Filled 2011-10-16: qty 3

## 2011-10-16 MED ORDER — FUROSEMIDE 10 MG/ML IJ SOLN
20.0000 mg | Freq: Once | INTRAMUSCULAR | Status: AC
  Administered 2011-10-16: 20 mg via INTRAVENOUS
  Filled 2011-10-16: qty 2

## 2011-10-16 MED ORDER — INSULIN GLARGINE 100 UNIT/ML ~~LOC~~ SOLN
24.0000 [IU] | SUBCUTANEOUS | Status: DC
  Administered 2011-10-16 – 2011-10-17 (×2): 24 [IU] via SUBCUTANEOUS
  Filled 2011-10-16: qty 3

## 2011-10-16 MED ORDER — TRAMADOL HCL 50 MG PO TABS
50.0000 mg | ORAL_TABLET | Freq: Four times a day (QID) | ORAL | Status: DC | PRN
  Administered 2011-10-16 – 2011-10-17 (×2): 50 mg via ORAL
  Filled 2011-10-16 (×2): qty 1

## 2011-10-16 MED ORDER — FUROSEMIDE 10 MG/ML IJ SOLN
40.0000 mg | Freq: Once | INTRAMUSCULAR | Status: AC
  Administered 2011-10-16: 40 mg via INTRAVENOUS

## 2011-10-16 MED ORDER — SODIUM CHLORIDE 0.9 % IV SOLN
INTRAVENOUS | Status: DC | PRN
  Filled 2011-10-16: qty 1

## 2011-10-16 NOTE — Progress Notes (Signed)
Physical Therapy Evaluation Patient Details Name: Casey Gilmore MRN: [id number] DOB: 23-Oct-1941 Today's Date: 10/16/2011  Problem List: There is no problem list on file for this patient.   Past Medical History:  Past Medical History  Diagnosis Date  . Diabetes mellitus   . Hypertension   . Heart murmur   . Shortness of breath   . Blood transfusion   . Arthritis   . Myocardial infarction   . Anemia   . Pneumonia hx 8/12  . Hiatal hernia    Past Surgical History:  Past Surgical History  Procedure Date  . Tonsillectomy   . Cardiac catheterization results on chart  . Eye surgery bil cat 08  . Fracture surgery pin in lft hip  . Joint replacement left knee 08, rt hip 08    PT Assessment/Plan/Recommendation PT Assessment Clinical Impression Statement: pt is 70 yo male with vlave replacement who is currently limited by pain and anxiety.  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  Pt lived alone PTA but reports 2 sons, one that lives next door and can stay with him as needed.  Given that pt was independent PTA, expect him to progress well.  I do recommend 24 hr assist at initial discharge and if this is available, rec home with HHPT.  IF 24 hr assist is not available, spoke with pt about the possibilityof short term rehab at [facility].  Pt does not like this option.  PT will follow acutely. PT Recommendation/Assessment: Patient will need skilled PT in the acute care venue PT Problem List: Decreased strength;Decreased activity tolerance;Decreased mobility;Decreased knowledge of use of DME;Decreased knowledge of precautions;Cardiopulmonary status limiting activity;Impaired sensation;Pain Barriers to Discharge: Decreased caregiver support (questionable) PT Therapy Diagnosis : Difficulty walking;Generalized weakness;Acute pain PT  Plan PT Frequency: Min 3X/week PT Treatment/Interventions: DME instruction;Gait training;Stair training;Functional mobility training;Therapeutic exercise;Balance training;Patient/family education PT Recommendation Recommendations for Other Services: OT consult Follow Up Recommendations: Home health PT;24 hour supervision/assistance Equipment Recommended:  (has RW already) PT Goals  Acute Rehab PT Goals PT Goal Formulation: With patient Time For Goal Achievement: 2 weeks Pt will Roll Supine to Right Side: with modified independence PT Goal: Rolling Supine to Right Side - Progress: Progressing toward goal Pt will Roll Supine to Left Side: with modified independence PT Goal: Rolling Supine to Left Side - Progress: Other (comment) (not tested today) Pt will go Supine/Side to Sit: with modified independence PT Goal: Supine/Side to Sit - Progress: Progressing toward goal Pt will go Sit to Supine/Side: with modified independence PT Goal: Sit to Supine/Side - Progress: Other (comment) (NT today) Pt will Transfer Sit to Stand/Stand to Sit: with modified independence PT Transfer Goal: Sit to Stand/Stand to Sit - Progress: Progressing toward goal Pt will Transfer Bed to Chair/Chair to Bed: with supervision;Other (comment) (with RW) PT Transfer Goal: Bed to Chair/Chair to Bed - Progress: Progressing toward goal Pt will Ambulate: 51 - 150 feet;with supervision;with rolling walker PT Goal: Ambulate - Progress: Other (comment) (NT today) Pt will Go Up / Down Stairs: 1-2 stairs;with min assist PT Goal: Up/Down Stairs - Progress: Other (comment) (NT today) Additional Goals Additional Goal #1: Pt will verbalize sternal precautions and keep with mobility PT Goal: Additional Goal #1 - Progress: Progressing toward goal  PT Evaluation Precautions/Restrictions  Precautions Precautions: Fall;Sternal Precaution Comments: reviewed sternal prec Required Braces or Orthoses: No  Restrictions Weight Bearing  Restrictions: No Prior Functioning  Home Living Lives With: Alone Receives Help From: Family (son lives next door and pt reports he can stay with him 24/7) Type of Home: House Home Layout: One level Home Access: Stairs to enter [employer] of Steps: 2 Home Adaptive Equipment: Walker - rolling Prior Function Level of Independence: Independent with basic ADLs;Independent with homemaking with ambulation;Independent with gait;Independent with transfers (I without AD but did deal with daily hip/ knee pain) Able to Take Stairs?: Yes Driving: Yes Vocation: Part time employment 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 Arousal/Alertness: Awake/alert Overall Cognitive Status: Appears within functional limits for tasks assessed Orientation Level: Oriented X4 Cognition - Other Comments: very fearful of incision coming apart, anxious Sensation/Coordination Sensation Light Touch: Impaired Detail Light Touch Impaired Details: Impaired RUE;Impaired LUE (numbness bilat hands since surgery) Stereognosis: Not tested Hot/Cold: Not tested Proprioception: Not tested Coordination Gross Motor Movements are Fluid and Coordinated: No (due to pain) Fine Motor Movements are Fluid and Coordinated: No Coordination and Movement Description: difficulty grasping objects, specifically water cup and RW Extremity Assessment RUE Assessment RUE Assessment: Exceptions to [facility] RUE Strength Gross Grasp: Impaired RLE Assessment RLE Assessment: Exceptions to [facility] RLE Strength RLE Overall Strength Comments: Right hip weak since THA, also with LLD LLE Assessment LLE Assessment: Exceptions to [facility] LLE Strength LLE Overall Strength Comments: Left knee extension weakness since TKA (left knee buckling in standing) Mobility (including Balance) Bed Mobility Bed Mobility: Yes Rolling Right: 1: +2 Total assist;Patient percentage (comment) (pt 40%) Rolling Right Details (indicate cue type and reason):  vc's to reach for R rail; slow, labored mvmt Right Sidelying to Sit: 1: +2 Total assist;Patient percentage (comment) (pt 25%) Right Sidelying to Sit Details (indicate cue type and reason): difiiculty transferring wt to left hip, very painful to chest Sitting - Scoot to Edge of Bed: 2: Max assist Transfers Transfers: Yes Sit to Stand: 1: +2 Total assist;Patient percentage (comment);From elevated surface;From bed (pt 95%) Sit to Stand Details (indicate cue type and reason): vc's for hand placement, bed elevated Stand to Sit: 1: +2 Total assist;Patient percentage (comment);To chair/3-in-1;With upper extremity assist (pt 75%, ) Stand to Sit Details: assist to control descent Stand Pivot Transfers: 1: +2 Total assist (with RW, pt 75%) Stand Pivot Transfer Details (indicate cue type and reason): pt able to take to steps bkwd to chair Ambulation/Gait Ambulation/Gait: No Stairs: No Wheelchair Mobility Wheelchair Mobility: No  Posture/Postural Control Posture/Postural Control: Postural limitations Postural Limitations: fwd flexion duw to chest pain Balance Balance Assessed: No     End of Session PT - End of Session Equipment Utilized During Treatment: Gait belt Activity Tolerance: Patient limited by fatigue;Patient limited by pain (also limited by fear) Patient left: in chair;with call bell in reach;with family/visitor present Nurse Communication: Mobility status for transfers General Behavior During Session: Other (comment) (very anxious) Cognition: [facility] for tasks performed  Lyanne Co  [phone] 10/16/2011, 12:10 PM

## 2011-10-17 ENCOUNTER — Inpatient Hospital Stay (HOSPITAL_COMMUNITY): Payer: Medicare Other

## 2011-10-17 DIAGNOSIS — I350 Nonrheumatic aortic (valve) stenosis: Secondary | ICD-10-CM

## 2011-10-17 DIAGNOSIS — M1990 Unspecified osteoarthritis, unspecified site: Secondary | ICD-10-CM

## 2011-10-17 DIAGNOSIS — IMO0001 Reserved for inherently not codable concepts without codable children: Secondary | ICD-10-CM

## 2011-10-17 DIAGNOSIS — E1165 Type 2 diabetes mellitus with hyperglycemia: Secondary | ICD-10-CM

## 2011-10-17 DIAGNOSIS — E119 Type 2 diabetes mellitus without complications: Secondary | ICD-10-CM

## 2011-10-17 LAB — BASIC METABOLIC PANEL
BUN: 25 mg/dL — ABNORMAL HIGH (ref 6–23)
CO2: 26 mEq/L (ref 19–32)
Calcium: 9.4 mg/dL (ref 8.4–10.5)
Chloride: 107 mEq/L (ref 96–112)
Creatinine, Ser: 1.19 mg/dL (ref 0.50–1.35)
GFR calc Af Amer: 70 mL/min — ABNORMAL LOW (ref >90)
GFR calc non Af Amer: 60 mL/min — ABNORMAL LOW (ref >90)
Glucose, Bld: 176 mg/dL — ABNORMAL HIGH (ref 70–99)
Potassium: 4.7 mEq/L (ref 3.5–5.1)
Sodium: 140 mEq/L (ref 135–145)

## 2011-10-17 LAB — GLUCOSE, CAPILLARY
Glucose-Capillary: 110 mg/dL — ABNORMAL HIGH (ref 70–99)
Glucose-Capillary: 115 mg/dL — ABNORMAL HIGH (ref 70–99)
Glucose-Capillary: 112 mg/dL — ABNORMAL HIGH (ref 70–99)
Glucose-Capillary: 113 mg/dL — ABNORMAL HIGH (ref 70–99)
Glucose-Capillary: 105 mg/dL — ABNORMAL HIGH (ref 70–99)
Glucose-Capillary: 103 mg/dL — ABNORMAL HIGH (ref 70–99)
Glucose-Capillary: 149 mg/dL — ABNORMAL HIGH (ref 70–99)
Glucose-Capillary: 112 mg/dL — ABNORMAL HIGH (ref 70–99)
Glucose-Capillary: 169 mg/dL — ABNORMAL HIGH (ref 70–99)
Glucose-Capillary: 174 mg/dL — ABNORMAL HIGH (ref 70–99)
Glucose-Capillary: 170 mg/dL — ABNORMAL HIGH (ref 70–99)
Glucose-Capillary: 150 mg/dL — ABNORMAL HIGH (ref 70–99)

## 2011-10-17 MED ORDER — POTASSIUM CHLORIDE CRYS ER 20 MEQ PO TBCR
20.0000 meq | EXTENDED_RELEASE_TABLET | Freq: Once | ORAL | Status: AC
  Administered 2011-10-17: 20 meq via ORAL
  Filled 2011-10-17: qty 1

## 2011-10-17 MED ORDER — INSULIN GLARGINE 100 UNIT/ML ~~LOC~~ SOLN
30.0000 [IU] | SUBCUTANEOUS | Status: DC
  Administered 2011-10-18 – 2011-10-19 (×2): 30 [IU] via SUBCUTANEOUS

## 2011-10-17 MED ORDER — OXYCODONE HCL 5 MG PO TABS
5.0000 mg | ORAL_TABLET | ORAL | Status: DC | PRN
  Administered 2011-10-17 – 2011-10-18 (×5): 5 mg via ORAL
  Filled 2011-10-17 (×6): qty 1

## 2011-10-17 MED ORDER — FUROSEMIDE 10 MG/ML IJ SOLN
40.0000 mg | Freq: Two times a day (BID) | INTRAMUSCULAR | Status: DC
  Administered 2011-10-17 – 2011-10-18 (×3): 40 mg via INTRAVENOUS
  Filled 2011-10-17 (×3): qty 4

## 2011-10-17 NOTE — Progress Notes (Signed)
2 Days Post-Op Procedure(s) (LRB): AORTIC VALVE REPLACEMENT (AVR) (N/A)                     [address]            [name] [address]          [phone]         Subjective:   Feels stronger,wants to ambulate,needs narcotic for surgical pain  Objective: Vital signs in last 24 hours: Temp:  [98.3 F (36.8 C)-99.3 F (37.4 C)] 99.3 F (37.4 C) (11/11 0400) Pulse Rate:  [73-85] 84  (11/11 0900) Cardiac Rhythm:  [-] Normal sinus rhythm (11/11 0700) Resp:  [9-27] 18  (11/11 0900) BP: (98-145)/(52-74) 138/70 mmHg (11/11 0900) SpO2:  [88 %-98 %] 95 % (11/11 0900) Weight:  [235 lb 10.8 oz (106.9 kg)] 235 lb 10.8 oz (106.9 kg) (11/11 9147)  Hemodynamic parameters for last 24 hours:    Intake/Output from previous day: 11/10 0701 - 11/11 0700 In: 1778.5 [P.O.:900; I.V.:528.5; IV Piggyback:350] Out: 610 [Urine:540; Chest Tube:70] Intake/Output this shift:    General appearance: alert and oriented,lungs clear,abd soft extrem warm  Lab Results:  Basename 10/16/11 1813 10/16/11 1700 10/16/11 0400  WBC -- 13.9* 14.2*  HGB 10.5* 9.9* --  HCT 31.0* 31.6* --  PLT -- 79* 107*   BMET:  Basename 10/17/11 0430 10/16/11 1813 10/16/11 0400  NA 140 140 --  K 4.7 4.4 --  CL 107 104 --  CO2 26 -- 25  GLUCOSE 176* 172* --  BUN 25* 23 --  CREATININE 1.19 1.50* --  CALCIUM 9.4 -- 8.6    PT/INR: No results found for this basename: LABPROT,INR in the last 72 hours ABG    Component Value Date/Time   PHART 7.367 10/15/2011 2146   HCO3 23.6 10/15/2011 2146   TCO2 23 10/16/2011 1813   ACIDBASEDEF 2.0 10/15/2011 2146   O2SAT 93.0 10/15/2011 2146   CBG (last 3)   Basename 10/17/11 0728 10/16/11 2333 10/16/11 1954  GLUCAP 174* 161* 166*    Assessment/Plan: S/P Procedure(s) (LRB): AORTIC VALVE REPLACEMENT (AVR) (N/A) Mobilize Diuresis Diabetes control,increase lantus dose   LOS: 2 days    VAN TRIGT III,PETER 10/17/2011

## 2011-10-17 NOTE — Progress Notes (Signed)
Patient examined and record reviewed.Hemodynamics stable,labs satisfactory.Patient had stable day.Continue current care.Walking with walker ,chronic DJD of hip Casey Gilmore,Casey Gilmore 10/17/2011

## 2011-10-18 ENCOUNTER — Inpatient Hospital Stay (HOSPITAL_COMMUNITY): Payer: Medicare Other

## 2011-10-18 LAB — GLUCOSE, CAPILLARY
Glucose-Capillary: 153 mg/dL — ABNORMAL HIGH (ref 70–99)
Glucose-Capillary: 163 mg/dL — ABNORMAL HIGH (ref 70–99)
Glucose-Capillary: 151 mg/dL — ABNORMAL HIGH (ref 70–99)
Glucose-Capillary: 127 mg/dL — ABNORMAL HIGH (ref 70–99)
Glucose-Capillary: 143 mg/dL — ABNORMAL HIGH (ref 70–99)
Glucose-Capillary: 143 mg/dL — ABNORMAL HIGH (ref 70–99)
Glucose-Capillary: 174 mg/dL — ABNORMAL HIGH (ref 70–99)

## 2011-10-18 LAB — CBC
HCT: 31 % — ABNORMAL LOW (ref 39.0–52.0)
Hemoglobin: 9.5 g/dL — ABNORMAL LOW (ref 13.0–17.0)
MCH: 26.9 pg (ref 26.0–34.0)
MCHC: 30.6 g/dL (ref 30.0–36.0)
MCV: 87.8 fL (ref 78.0–100.0)
Platelets: 88 10*3/uL — ABNORMAL LOW (ref 150–400)
RBC: 3.53 MIL/uL — ABNORMAL LOW (ref 4.22–5.81)
RDW: 18.9 % — ABNORMAL HIGH (ref 11.5–15.5)
WBC: 12.6 10*3/uL — ABNORMAL HIGH (ref 4.0–10.5)

## 2011-10-18 LAB — BASIC METABOLIC PANEL WITH GFR
BUN: 26 mg/dL — ABNORMAL HIGH (ref 6–23)
CO2: 28 meq/L (ref 19–32)
Calcium: 9.3 mg/dL (ref 8.4–10.5)
Chloride: 103 meq/L (ref 96–112)
Creatinine, Ser: 0.84 mg/dL (ref 0.50–1.35)
GFR calc Af Amer: >90 mL/min
GFR calc non Af Amer: 87 mL/min — ABNORMAL LOW
Glucose, Bld: 152 mg/dL — ABNORMAL HIGH (ref 70–99)
Potassium: 4 meq/L (ref 3.5–5.1)
Sodium: 139 meq/L (ref 135–145)

## 2011-10-18 MED ORDER — GUAIFENESIN ER 600 MG PO TB12
600.0000 mg | ORAL_TABLET | Freq: Two times a day (BID) | ORAL | Status: DC | PRN
  Filled 2011-10-18: qty 1

## 2011-10-18 MED ORDER — OXYCODONE HCL 5 MG PO TABS
5.0000 mg | ORAL_TABLET | ORAL | Status: DC | PRN
  Administered 2011-10-18: 5 mg via ORAL
  Administered 2011-10-19 (×3): 10 mg via ORAL
  Administered 2011-10-19: 5 mg via ORAL
  Administered 2011-10-20 – 2011-10-22 (×8): 10 mg via ORAL
  Filled 2011-10-18: qty 1
  Filled 2011-10-18 (×2): qty 2
  Filled 2011-10-18: qty 1
  Filled 2011-10-18 (×2): qty 2
  Filled 2011-10-18: qty 1
  Filled 2011-10-18 (×7): qty 2

## 2011-10-18 MED ORDER — DEXTROSE 5 % IV SOLN
30.0000 mg/h | INTRAVENOUS | Status: DC
  Administered 2011-10-18: 30.06 mg/h via INTRAVENOUS
  Administered 2011-10-18: 60 mg/h via INTRAVENOUS
  Filled 2011-10-18 (×2): qty 9

## 2011-10-18 MED ORDER — ONDANSETRON HCL 4 MG PO TABS: 4.0000 mg | ORAL_TABLET | Freq: Four times a day (QID) | ORAL | Status: DC | PRN

## 2011-10-18 MED ORDER — INSULIN ASPART 100 UNIT/ML ~~LOC~~ SOLN
0.0000 [IU] | Freq: Three times a day (TID) | SUBCUTANEOUS | Status: DC
  Administered 2011-10-18 (×2): 4 [IU] via SUBCUTANEOUS
  Administered 2011-10-18 – 2011-10-19 (×5): 2 [IU] via SUBCUTANEOUS
  Administered 2011-10-20: 4 [IU] via SUBCUTANEOUS
  Administered 2011-10-20 – 2011-10-21 (×5): 2 [IU] via SUBCUTANEOUS
  Filled 2011-10-18: qty 3

## 2011-10-18 MED ORDER — AMIODARONE HCL 200 MG PO TABS
400.0000 mg | ORAL_TABLET | Freq: Once | ORAL | Status: AC
  Administered 2011-10-18: 400 mg via ORAL
  Filled 2011-10-18: qty 2

## 2011-10-18 MED ORDER — ACETAMINOPHEN 325 MG PO TABS: 650.0000 mg | ORAL_TABLET | Freq: Four times a day (QID) | ORAL | Status: DC | PRN

## 2011-10-18 MED ORDER — MAGNESIUM HYDROXIDE 400 MG/5ML PO SUSP: 30.0000 mL | Freq: Every day | ORAL | Status: DC | PRN

## 2011-10-18 MED ORDER — METOPROLOL TARTRATE 1 MG/ML IV SOLN
5.0000 mg | Freq: Once | INTRAVENOUS | Status: AC
  Administered 2011-10-18: 5 mg via INTRAVENOUS

## 2011-10-18 MED ORDER — ALUM & MAG HYDROXIDE-SIMETH 400-400-40 MG/5ML PO SUSP
15.0000 mL | ORAL | Status: DC | PRN
  Filled 2011-10-18: qty 30

## 2011-10-18 MED ORDER — BISACODYL 10 MG RE SUPP: 10.0000 mg | Freq: Every day | RECTAL | Status: DC | PRN

## 2011-10-18 MED ORDER — PANTOPRAZOLE SODIUM 40 MG PO TBEC
40.0000 mg | DELAYED_RELEASE_TABLET | Freq: Every day | ORAL | Status: DC
  Administered 2011-10-19 – 2011-10-22 (×4): 40 mg via ORAL
  Filled 2011-10-18 (×5): qty 1

## 2011-10-18 MED ORDER — SODIUM CHLORIDE 0.9 % IJ SOLN
3.0000 mL | Freq: Two times a day (BID) | INTRAMUSCULAR | Status: DC
  Administered 2011-10-18 – 2011-10-22 (×9): 3 mL via INTRAVENOUS

## 2011-10-18 MED ORDER — TRAMADOL HCL 50 MG PO TABS: 50.0000 mg | ORAL_TABLET | ORAL | Status: DC | PRN

## 2011-10-18 MED ORDER — ONDANSETRON HCL 4 MG/2ML IJ SOLN: 4.0000 mg | Freq: Four times a day (QID) | INTRAMUSCULAR | Status: DC | PRN

## 2011-10-18 MED ORDER — POTASSIUM CHLORIDE CRYS ER 20 MEQ PO TBCR
20.0000 meq | EXTENDED_RELEASE_TABLET | Freq: Once | ORAL | Status: AC
  Administered 2011-10-18: 20 meq via ORAL
  Filled 2011-10-18: qty 1

## 2011-10-18 MED ORDER — POVIDONE-IODINE 10 % EX SOLN
1.0000 "application " | Freq: Two times a day (BID) | CUTANEOUS | Status: DC
  Administered 2011-10-18 – 2011-10-21 (×8): 1 via TOPICAL
  Filled 2011-10-18: qty 15

## 2011-10-18 MED ORDER — DOCUSATE SODIUM 100 MG PO CAPS
200.0000 mg | ORAL_CAPSULE | Freq: Every day | ORAL | Status: DC
  Administered 2011-10-19 – 2011-10-21 (×3): 200 mg via ORAL
  Filled 2011-10-18 (×4): qty 2

## 2011-10-18 MED ORDER — MOVING RIGHT ALONG BOOK
Freq: Once | Status: AC
  Administered 2011-10-18: 11:00:00
  Filled 2011-10-18: qty 1

## 2011-10-18 MED ORDER — BISACODYL 5 MG PO TBEC: 10.0000 mg | DELAYED_RELEASE_TABLET | Freq: Every day | ORAL | Status: DC | PRN

## 2011-10-18 MED ORDER — FUROSEMIDE 40 MG PO TABS
40.0000 mg | ORAL_TABLET | Freq: Every day | ORAL | Status: DC
  Administered 2011-10-18 – 2011-10-22 (×5): 40 mg via ORAL
  Filled 2011-10-18 (×5): qty 1

## 2011-10-18 MED ORDER — ROSUVASTATIN CALCIUM 20 MG PO TABS
20.0000 mg | ORAL_TABLET | Freq: Every day | ORAL | Status: DC
  Administered 2011-10-18 – 2011-10-21 (×4): 20 mg via ORAL
  Filled 2011-10-18 (×5): qty 1

## 2011-10-18 MED FILL — Sodium Chloride Irrigation Soln 0.9%: Qty: 1000 | Status: AC

## 2011-10-18 MED FILL — Electrolyte-R (PH 7.4) Solution: INTRAVENOUS | Qty: 1000 | Status: AC

## 2011-10-18 MED FILL — Sodium Chloride IV Soln 0.9%: INTRAVENOUS | Qty: 1000 | Status: AC

## 2011-10-18 MED FILL — Heparin Sodium (Porcine) Inj 1000 Unit/ML: INTRAMUSCULAR | Qty: 30 | Status: AC

## 2011-10-18 NOTE — Progress Notes (Signed)
  Went into SVT this AM  Back in SR on Amiodarone

## 2011-10-18 NOTE — Progress Notes (Signed)
UR Completed.   Casey Gilmore Jane 10/18/2011  

## 2011-10-18 NOTE — Progress Notes (Signed)
Physical Therapy Treatment Patient Details Name: Casey Gilmore MRN: [id number] DOB: 17-Sep-1941 Today's Date: 10/18/2011  PT Assessment/Plan  PT - Assessment/Plan Comments on Treatment Session: Patient improving in ability to tolerate ambulation.  Pt. will benefit from continued PT to adress balance, gait and endurance.   PT Plan: Discharge plan remains appropriate;Frequency remains appropriate PT Goals  Acute Rehab PT Goals PT Goal: Rolling Supine to Right Side - Progress: Other (comment) PT Goal: Rolling Supine to Left Side - Progress: Other (comment) Pt will go Supine/Side to Sit: Other (comment) PT Goal: Sit to Supine/Side - Progress: Progressing toward goal PT Transfer Goal: Sit to Stand/Stand to Sit - Progress: Progressing toward goal PT Transfer Goal: Bed to Chair/Chair to Bed - Progress: Progressing toward goal PT Goal: Ambulate - Progress: Progressing toward goal PT Goal: Up/Down Stairs - Progress: Other (comment) Additional Goals PT Goal: Additional Goal #1 - Progress: Partly met  PT Treatment Precautions/Restrictions  Precautions Precautions: Sternal;Fall Precaution Comments: reviewed sternal prec Required Braces or Orthoses: No Restrictions Weight Bearing Restrictions: Yes LLE Weight Bearing: Partial weight bearing Other Position/Activity Restrictions: Decreased wt. bearing bil UEs for sternal precautions Mobility (including Balance) Bed Mobility Bed Mobility: Yes Rolling Right: Not tested (comment) Right Sidelying to Sit: Not tested (comment) Sitting - Scoot to Edge of Bed: Not tested (comment) Sit to Supine - Left: 1: +2 Total assist;Patient percentage (comment) (pt. = 60%) Sit to Supine - Left Details (indicate cue type and reason): Pt. needed assist for LEs and for sternal precautions Transfers Transfers: Yes Sit to Stand: 1: +2 Total assist;Patient percentage (comment);From chair/3-in-1 (cues to follow sternal precautions ) Sit to Stand Details (indicate  cue type and reason): vcs for hand placement Stand to Sit: 1: +2 Total assist;Patient percentage (comment);To elevated surface;To bed (pt = 75%) Stand to Sit Details: Pt. did better with stand to sit since bed was elevated. Stand Pivot Transfers: Not tested (comment) Ambulation/Gait Ambulation/Gait: Yes Ambulation/Gait Assistance: 4: Min assist Ambulation/Gait Assistance Details (indicate cue type and reason): Pt. needed cues to stay close to RW.  Needed several standing rest breaks.   Ambulation Distance (Feet): 260 Feet Assistive device: Rolling walker Gait Pattern: Step-through pattern;Decreased step length - left;Decreased weight shift to left;Left flexed knee in stance;Antalgic;Lateral trunk lean to right Gait velocity: Pt. with fused left hip.  Has an antalgic gait that is premorbid.   Stairs: No Wheelchair Mobility Wheelchair Mobility: No  Posture/Postural Control Posture/Postural Control: Postural limitations Postural Limitations: Has trunk instablity secondary to left fused hip affecting the way he moves with ambulation.  Trunk with increased flexion at times.  Increased lateral lean due to left hip fusion.  Balance Balance Assessed: No Exercise    End of Session PT - End of Session Equipment Utilized During Treatment: Gait belt Activity Tolerance: Patient tolerated treatment well Patient left: in chair;with call bell in reach;with family/visitor present Nurse Communication: Mobility status for ambulation General Behavior During Session: [facility] for tasks performed Cognition: [facility] for tasks performed  Casey Gilmore,Casey Gilmore 10/18/2011, 1:39 PM

## 2011-10-18 NOTE — Clinical Documentation Improvement (Signed)
GENERIC DOCUMENTATION CLARIFICATION QUERY                                                                                          10/18/11    Dear Dr. Donata Clay and/or Associates,  In a better effort to capture your patient's severity of illness, reflect appropriate length of stay and utilization of resources, a review of the patient medical record has revealed the following indicators.     Based on your clinical judgement, please document the TYPE of SVT requiring IV Amiodarone, in the progress notes and discharge summary:   - Paroxsymal   - Sustained   - Postperative   - Other Condition__________________   - Unable to Clinically Determine    Supporting Information:  "Brief run of SVT this am.  Now on IV Amiodarone.  In NSR." 10/18/11  CTS progress note   Please update your documentation within the medical record to reflect your response to this query.   In responding to this query please exercise your independent judgment.  The fact that a query is asked, does not imply that any particular answer is desired or expected.     Reviewed:    Thank You!  Jerral Ralph RN BSN Certified Clinical Documentation Specialist  Cell - [phone]  Health Information Management [place]  noted

## 2011-10-18 NOTE — Progress Notes (Signed)
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:770; I.V.:60; IV Piggyback:50] Out: 3010 [Urine:3010] Intake/Output this shift: Total I/O In: 250 [P.O.:230; I.V.:20] Out: 90 [Urine:90]  General appearance: alert and no distress Heart: systolic murmur: early systolic 2/6, crescendo at 2nd right intercostal space Lungs: rales RLL Abdomen: normal findings: soft, non-tender Extremities: no edema, redness or tenderness in the calves or thighs  Lab Results:  [facility] 10/18/11 0550 10/16/11 1813 10/16/11 1700  WBC 12.6* -- 13.9*  HGB 9.5* 10.5* --  HCT 31.0* 31.0* --  PLT 88* -- 79*   BMET:  Basename 10/18/11 0550 10/17/11 0430  NA 139 140  K 4.0 4.7  CL 103 107  CO2 28 26  GLUCOSE 152* 176*  BUN 26* 25*  CREATININE 0.84 1.19  CALCIUM 9.3 9.4    PT/INR: No results found for this basename: LABPROT,INR in the last 72 hours ABG    Component Value Date/Time   PHART 7.367 10/15/2011 2146   HCO3 23.6 10/15/2011 2146   TCO2 23 10/16/2011 1813   ACIDBASEDEF 2.0 10/15/2011 2146   O2SAT 93.0 10/15/2011 2146   CBG (last 3)   Basename 10/18/11 0732 10/17/11 1546 10/17/11 1138  GLUCAP 153* 150* 170*    Assessment/Plan: S/P Procedure(s) (LRB): AORTIC VALVE REPLACEMENT (AVR) (N/A) See progression orders   NO  COUMADIN PLANNED DUE TO RECENT UGI BLEED REQ ENDOSCOPY,LASER RX   LOS: 3 days    VAN TRIGT III,Emalia Witkop 10/18/2011

## 2011-10-19 DIAGNOSIS — E1165 Type 2 diabetes mellitus with hyperglycemia: Secondary | ICD-10-CM

## 2011-10-19 DIAGNOSIS — IMO0001 Reserved for inherently not codable concepts without codable children: Secondary | ICD-10-CM

## 2011-10-19 LAB — BASIC METABOLIC PANEL
BUN: 26 mg/dL — ABNORMAL HIGH (ref 6–23)
CO2: 30 mEq/L (ref 19–32)
Calcium: 9.1 mg/dL (ref 8.4–10.5)
Chloride: 102 mEq/L (ref 96–112)
Creatinine, Ser: 0.82 mg/dL (ref 0.50–1.35)
GFR calc Af Amer: >90 mL/min (ref >90)
GFR calc non Af Amer: 88 mL/min — ABNORMAL LOW (ref >90)
Glucose, Bld: 150 mg/dL — ABNORMAL HIGH (ref 70–99)
Potassium: 3.6 mEq/L (ref 3.5–5.1)
Sodium: 140 mEq/L (ref 135–145)

## 2011-10-19 LAB — CBC
HCT: 28.7 % — ABNORMAL LOW (ref 39.0–52.0)
Hemoglobin: 8.9 g/dL — ABNORMAL LOW (ref 13.0–17.0)
MCH: 27.1 pg (ref 26.0–34.0)
MCHC: 31 g/dL (ref 30.0–36.0)
MCV: 87.5 fL (ref 78.0–100.0)
Platelets: 112 10*3/uL — ABNORMAL LOW (ref 150–400)
RBC: 3.28 MIL/uL — ABNORMAL LOW (ref 4.22–5.81)
RDW: 18.5 % — ABNORMAL HIGH (ref 11.5–15.5)
WBC: 8.7 10*3/uL (ref 4.0–10.5)

## 2011-10-19 LAB — GLUCOSE, CAPILLARY
Glucose-Capillary: 159 mg/dL — ABNORMAL HIGH (ref 70–99)
Glucose-Capillary: 144 mg/dL — ABNORMAL HIGH (ref 70–99)
Glucose-Capillary: 126 mg/dL — ABNORMAL HIGH (ref 70–99)
Glucose-Capillary: 129 mg/dL — ABNORMAL HIGH (ref 70–99)

## 2011-10-19 MED ORDER — METOPROLOL TARTRATE 12.5 MG HALF TABLET
12.5000 mg | ORAL_TABLET | Freq: Two times a day (BID) | ORAL | Status: DC
  Administered 2011-10-19 – 2011-10-22 (×7): 12.5 mg via ORAL
  Filled 2011-10-19 (×8): qty 1

## 2011-10-19 MED ORDER — POTASSIUM CHLORIDE CRYS ER 20 MEQ PO TBCR
20.0000 meq | EXTENDED_RELEASE_TABLET | Freq: Every day | ORAL | Status: DC
  Administered 2011-10-19 – 2011-10-22 (×4): 20 meq via ORAL
  Filled 2011-10-19 (×4): qty 1

## 2011-10-19 MED ORDER — LACTULOSE 10 GM/15ML PO SOLN
30.0000 g | Freq: Once | ORAL | Status: AC
  Administered 2011-10-19: 30 g via ORAL
  Filled 2011-10-19: qty 45

## 2011-10-19 MED ORDER — POLYSACCHARIDE IRON 150 MG PO CAPS
150.0000 mg | ORAL_CAPSULE | Freq: Every day | ORAL | Status: DC
  Administered 2011-10-19 – 2011-10-22 (×4): 150 mg via ORAL
  Filled 2011-10-19 (×4): qty 1

## 2011-10-19 MED ORDER — DEXTROSE 5 % IV SOLN: 30.0000 mg/h | INTRAVENOUS | Status: AC

## 2011-10-19 MED ORDER — INSULIN GLARGINE 100 UNIT/ML ~~LOC~~ SOLN
34.0000 [IU] | SUBCUTANEOUS | Status: DC
  Administered 2011-10-20 – 2011-10-22 (×3): 34 [IU] via SUBCUTANEOUS

## 2011-10-19 MED ORDER — AMIODARONE HCL 200 MG PO TABS
400.0000 mg | ORAL_TABLET | Freq: Two times a day (BID) | ORAL | Status: DC
  Administered 2011-10-19 – 2011-10-20 (×4): 400 mg via ORAL
  Filled 2011-10-19 (×6): qty 2

## 2011-10-19 MED FILL — Cefuroxime Sodium For Inj 750 MG: INTRAMUSCULAR | Qty: 750 | Status: AC

## 2011-10-19 MED FILL — Potassium Chloride Inj 2 mEq/ML: INTRAVENOUS | Qty: 40 | Status: AC

## 2011-10-19 MED FILL — Heparin Sodium (Porcine) Inj 5000 Unit/ML: INTRAMUSCULAR | Qty: 1 | Status: AC

## 2011-10-19 MED FILL — Aminocaproic Acid Inj 250 MG/ML: INTRAVENOUS | Qty: 40 | Status: AC

## 2011-10-19 MED FILL — Papaverine HCl Inj 30 MG/ML: INTRAMUSCULAR | Qty: 2 | Status: AC

## 2011-10-19 MED FILL — Insulin Regular (Human) Inj 100 Unit/ML: INTRAMUSCULAR | Qty: 10 | Status: AC

## 2011-10-19 MED FILL — Dopamine in Dextrose 5% Inj 3.2 MG/ML: INTRAVENOUS | Qty: 250 | Status: AC

## 2011-10-19 MED FILL — Magnesium Sulfate Inj 50%: INTRAMUSCULAR | Qty: 2 | Status: AC

## 2011-10-19 MED FILL — Epinephrine HCl Inj 1 MG/ML: INTRAMUSCULAR | Qty: 4 | Status: AC

## 2011-10-19 MED FILL — Cefuroxime Sodium For Inj 1.5 GM: INTRAMUSCULAR | Qty: 1.5 | Status: AC

## 2011-10-19 MED FILL — Vancomycin HCl For IV Soln 1 GM (Base Equivalent): INTRAVENOUS | Qty: 1500 | Status: AC

## 2011-10-19 MED FILL — Nitroglycerin IV Soln 200 MCG/ML in D5W: INTRAVENOUS | Qty: 250 | Status: AC

## 2011-10-19 MED FILL — Phenylephrine HCl Inj 10 MG/ML: INTRAMUSCULAR | Qty: 2 | Status: AC

## 2011-10-19 MED FILL — Dexmedetomidine HCl IV Soln 200 MCG/2ML: INTRAVENOUS | Qty: 4 | Status: AC

## 2011-10-19 NOTE — Progress Notes (Addendum)
4 Days Post-Op Procedure(s) (LRB): AORTIC VALVE REPLACEMENT (AVR) (N/A)  Subjective: Patient has complaints of productive cough at night.  Objective: Vital signs in last 24 hours: Patient Vitals for the past 24 hrs:  BP Temp Temp src Pulse Resp SpO2 Weight  10/19/11 0502 136/57 mmHg 98.6 F (37 C) Oral 78  20  90 % 225 lb 1.6 oz (102.105 kg)  10/18/11 2100 141/51 mmHg 98.9 F (37.2 C) Oral 83  22  92 % -  10/18/11 1800 139/54 mmHg - - 82  26  93 % -  10/18/11 1700 137/67 mmHg - - 76  26  93 % -  10/18/11 1600 131/60 mmHg 99.2 F (37.3 C) Oral 76  28  91 % -  10/18/11 1500 135/67 mmHg - - 79  26  93 % -  10/18/11 1400 144/59 mmHg - - 85  22  92 % -  10/18/11 1315 144/61 mmHg - - 81  24  93 % -  10/18/11 1300 141/54 mmHg - - 82  24  95 % -  10/18/11 1245 135/56 mmHg - - 80  25  94 % -  10/18/11 1230 67/27 mmHg - - 79  24  95 % -  10/18/11 1215 134/56 mmHg - - 76  24  95 % -  10/18/11 1200 137/61 mmHg 99.2 F (37.3 C) Oral 77  22  95 % -  10/18/11 1145 127/55 mmHg - - 75  23  96 % -  10/18/11 1130 129/61 mmHg - - 75  24  96 % -  10/18/11 1115 126/61 mmHg - - 77  26  96 % -  10/18/11 1100 126/60 mmHg - - 73  23  94 % -  10/18/11 1047 138/60 mmHg - - 78  30  95 % -  10/18/11 1045 138/60 mmHg - - 85  23  96 % -  10/18/11 1043 - - - 90  30  94 % -  10/18/11 1031 124/65 mmHg - - 76  25  94 % -  10/18/11 1030 132/63 mmHg - - 75  23  96 % -  10/18/11 1015 124/65 mmHg - - 70  22  95 % -  10/18/11 1000 124/59 mmHg - - 70  24  96 % -  10/18/11 0945 112/51 mmHg - - 68  22  96 % -  10/18/11 0930 111/80 mmHg - - 123  24  94 % -  10/18/11 0920 - - - 124  23  95 % -  10/18/11 0915 97/59 mmHg - - 121  21  93 % -  10/18/11 0910 119/78 mmHg - - 126  23  93 % -  10/18/11 0900 126/92 mmHg - - 123  25  90 % -  10/18/11 0850 - - - 139  25  91 % -  10/18/11 0840 - - - 97  29  91 % -   Pre op weight  104 kg;Today's weight 102.1 kg.      Intake/Output from previous day: 11/12 0701 - 11/13  0700 In: 902.6 [P.O.:530; I.V.:372.6] Out: 2765 [Urine:2765]   Physical Exam:  Cardiovascular: RRR, no murmurs, gallops, or rubs. Pulmonary: Slightly decreased at the bases; no rales, wheezes, or rhonchi. Abdomen: Soft, non tender, bowel sounds present. Extremities: Trace bilateral lower extremity edema. Wound: Clean and dry.  No erythema or signs of infection.  Lab Results: CBC: Basename 10/19/11 0739 10/18/11 0550  WBC 8.7 12.6*  HGB  8.9* 9.5*  HCT 28.7* 31.0*  PLT 112* 88*   BMET:  Basename 10/18/11 0550 10/17/11 0430  NA 139 140  K 4.0 4.7  CL 103 107  CO2 28 26  GLUCOSE 152* 176*  BUN 26* 25*  CREATININE 0.84 1.19  CALCIUM 9.3 9.4    PT/INR: No results found for this basename: LABPROT,INR in the last 72 hours ABG:  INR: Will add last result for INR, ABG once components are confirmed Will add last 4 CBG results once components are confirmed  Assessment/Plan:  1. CV - Previous SVT. No further run of SVT. SR with PVC's this am.  Will start Lopressor and Amiodarone po. Will discontinue Amiodarone gttp 1 hour after po started.  Continue VVI at 60. 2.  Pulmonary - Encourage incentive spirometer, flutter valve. Wean O2 as patient on 2L O2 via [place]. 3. Volume Overload - Continue with diuresis.  Will add a Potassium supplement. 4.  Acute blood loss anemia - H/H decreased from 9.5/31 to 8.9/28.7. Start Nu Iron. Check CBC am. 5.Thrombocytopenia-Platelets increased from 88 to 112,000. 6. Leukocytosis resolved-WBC decreased from 12.6 to 8.7. 7.DM-CBGs 176/152/150.  Continue Amaryl 4, will increase insulin glargine for better control. 8. If maintains SR, possible discharge at end of week.   Ardelle Balls, PA 10/19/2011   patient examined and medical record reviewed,agree with above note.NO COUMADIN DUE TO RECENT UGI BLEED VAN TRIGT III,Coralie Stanke 10/19/2011

## 2011-10-19 NOTE — Progress Notes (Signed)
Physical Therapy Treatment Patient Details Name: Casey Gilmore MRN: [id number] DOB: February 12, 1941 Today's Date: 10/19/2011  PT Assessment/Plan  PT - Assessment/Plan Comments on Treatment Session: Patient has progressed daily.  Patient's greatest limitation is due to his premorbid left hip fusion limiting his ability to perform sit to stand to sit transfers following sternal precautions.  During session, we discussed at length with patient and son to problem solve how to follow precautions.  Pt./ son understand precautions well and are putting necessary plans in place to follow the precautions.  PT Plan: Discharge plan remains appropriate;Frequency remains appropriate Follow Up Recommendations: Home health PT (Will need intermittent supervision with lift chair and HHPT ) Equipment Recommended:  (Son to bring lift chair from his home) PT Goals  Acute Rehab PT Goals PT Goal: Rolling Supine to Right Side - Progress: Other (comment) PT Goal: Rolling Supine to Left Side - Progress: Other (comment) PT Goal: Supine/Side to Sit - Progress: Other (comment) PT Goal: Sit to Supine/Side - Progress: Other (comment) PT Transfer Goal: Sit to Stand/Stand to Sit - Progress: Progressing toward goal PT Transfer Goal: Bed to Chair/Chair to Bed - Progress: Met PT Goal: Ambulate - Progress: Progressing toward goal PT Goal: Up/Down Stairs - Progress: Other (comment) Additional Goals PT Goal: Additional Goal #1 - Progress: Progressing toward goal  PT Treatment Precautions/Restrictions  Precautions Precautions: Fall;Sternal Precaution Comments: Reviewed sternal precautions especially with sit to stand Required Braces or Orthoses: No Restrictions Weight Bearing Restrictions: Yes LLE Weight Bearing: Partial weight bearing LLE Partial Weight Bearing Percentage or Pounds: PWB on bil UEs <20% Other Position/Activity Restrictions: Decreased wt. bearing bil UEs for sternal precautions Mobility (including  Balance) Bed Mobility Rolling Right: Not tested (comment) Rolling Right Details (indicate cue type and reason): Pt. sitting in chair on arrival Right Sidelying to Sit: Not tested (comment) Sitting - Scoot to Edge of Bed: Not tested (comment) Sit to Supine - Left: Not tested (comment) Transfers Sit to Stand: 1: +2 Total assist;Patient percentage (comment);With upper extremity assist;From chair/3-in-1 (pt = 75%) Sit to Stand Details (indicate cue type and reason): Pt. needed cues for hand placement and to follow sternal precautions.  Feel pt. is using UEs more than he should.  Spoke at length with pt. and son re: this.  We problem solved how to maintain precautions at D/C.  Pt remembered that they have a lift chair and son will bring it to pts' home.  Pts. bed is high so getting in and out of bed will not be an issue.   Stand to Sit: 1: +2 Total assist;Patient percentage (comment);With armrests;With upper extremity assist;To chair/3-in-1 (Pt. not following sternal precautions - see above comment) Stand to Sit Details: Pt. had difficulty with this as recliner chair is low.   Stand Pivot Transfers: 5: Supervision (using RW) Stand Pivot Transfer Details (indicate cue type and reason): Pt. able to take steps with RW without cues or assist Ambulation/Gait Ambulation/Gait Assistance: 4: Min assist (guard assist) Ambulation/Gait Assistance Details (indicate cue type and reason): Pt. doing much better sequencing steps and RW.  No cues needed today for this.  Needed less standing rest breaks.  Still needing O2. Ambulation Distance (Feet): 190 Feet Assistive device: Rolling walker Gait Pattern: Step-through pattern;Decreased step length - left;Left flexed knee in stance;Antalgic;Lateral trunk lean to right Stairs: No Wheelchair Mobility Wheelchair Mobility: No  Posture/Postural Control Posture/Postural Control: Postural limitations Postural Limitations: See last filed value Balance Balance Assessed:  No Exercise  Other Exercises Other Exercises:  Instructed pt. re: use of incentive spirometer and acapella 10 times every hour and pt. returned demonstration of [place] of both pieces of equipment. End of Session PT - End of Session Equipment Utilized During Treatment: Gait belt Activity Tolerance: Patient tolerated treatment well Patient left: in chair;with call bell in reach;with family/visitor present Nurse Communication: Mobility status for ambulation General Behavior During Session: [facility] for tasks performed Cognition: [facility] for tasks performed  INGOLD,Deandra Goering 10/19/2011, 3:39 PM [facility] [phone] (pager)

## 2011-10-19 NOTE — Plan of Care (Signed)
Problem: Phase III Progression Outcomes Goal: Time patient transferred to PCTU/Telemetry POD Outcome: Completed/Met Date Met:  10/19/11 10/18/11 at [employer]

## 2011-10-19 NOTE — Plan of Care (Signed)
Problem: Phase III Progression Outcomes Goal: Transfer to PCTU/Telemetry POD Outcome: Completed/Met Date Met:  10/19/11 Transfer to PCTU on 10/18/11 at [facility]

## 2011-10-20 ENCOUNTER — Inpatient Hospital Stay (HOSPITAL_COMMUNITY): Payer: Medicare Other

## 2011-10-20 DIAGNOSIS — E1165 Type 2 diabetes mellitus with hyperglycemia: Secondary | ICD-10-CM

## 2011-10-20 DIAGNOSIS — IMO0001 Reserved for inherently not codable concepts without codable children: Secondary | ICD-10-CM

## 2011-10-20 LAB — GLUCOSE, CAPILLARY
Glucose-Capillary: 146 mg/dL — ABNORMAL HIGH (ref 70–99)
Glucose-Capillary: 116 mg/dL — ABNORMAL HIGH (ref 70–99)
Glucose-Capillary: 166 mg/dL — ABNORMAL HIGH (ref 70–99)
Glucose-Capillary: 131 mg/dL — ABNORMAL HIGH (ref 70–99)

## 2011-10-20 LAB — CBC
Hemoglobin: 8.6 g/dL — ABNORMAL LOW (ref 13.0–17.0)
MCH: 27.5 pg (ref 26.0–34.0)
Platelets: 128 10*3/uL — ABNORMAL LOW (ref 150–400)
RBC: 3.13 MIL/uL — ABNORMAL LOW (ref 4.22–5.81)
WBC: 6.4 10*3/uL (ref 4.0–10.5)

## 2011-10-20 NOTE — Progress Notes (Signed)
5 Days Post-Op Procedure(s) (LRB): AORTIC VALVE REPLACEMENT (AVR) (N/A) Subjective: Feels well, no complaints.    Objective: Vital signs in last 24 hours: Temp:  [98.2 F (36.8 C)-99.2 F (37.3 C)] 98.2 F (36.8 C) (11/14 0502) Pulse Rate:  [71-93] 71  (11/14 0502) Cardiac Rhythm:  [-] Normal sinus rhythm (11/13 1935) Resp:  [18-24] 18  (11/14 0502) BP: (114-143)/(54-68) 114/63 mmHg (11/14 0502) SpO2:  [83 %-93 %] 93 % (11/14 0502) Weight:  [226 lb 6.6 oz (102.7 kg)] 226 lb 6.6 oz (102.7 kg) (11/14 0502)  Hemodynamic parameters for last 24 hours:    Intake/Output from previous day: 11/13 0701 - 11/14 0700 In: 957.4 [P.O.:610; I.V.:347.4] Out: 400 [Urine:400] Intake/Output this shift: Total I/O In: -  Out: 500 [Urine:500]  General appearance: no distress Heart: regular rate and rhythm Lungs: diminished breath sounds bibasilar Extremities: Trace LE edema Wound: Clean, dry, no erythema  Lab Results:  [facility] 10/20/11 0630 10/19/11 0739  WBC 6.4 8.7  HGB 8.6* 8.9*  HCT 27.5* 28.7*  PLT 128* 112*   BMET:  Basename 10/19/11 0739 10/18/11 0550  NA 140 139  K 3.6 4.0  CL 102 103  CO2 30 28  GLUCOSE 150* 152*  BUN 26* 26*  CREATININE 0.82 0.84  CALCIUM 9.1 9.3    PT/INR: No results found for this basename: LABPROT,INR in the last 72 hours ABG    Component Value Date/Time   PHART 7.367 10/15/2011 2146   HCO3 23.6 10/15/2011 2146   TCO2 23 10/16/2011 1813   ACIDBASEDEF 2.0 10/15/2011 2146   O2SAT 93.0 10/15/2011 2146   CBG (last 3)   Basename 10/20/11 0711 10/19/11 2211 10/19/11 1616  GLUCAP 146* 129* 126*    Assessment/Plan: S/P Procedure(s) (LRB): AORTIC VALVE REPLACEMENT (AVR) (N/A) CV- No more arrhythmias.  Continue Amio, Lopressor.  Disconnect pacer box, roll and tape wires DM- CBGs improved.  Continue Amaryl, Lantus Volume overload- diurese Continue PT/CRPI Wean O2 as tolerated Hopefully home soon   LOS: 5 days    Casey Gilmore  H 10/20/2011

## 2011-10-20 NOTE — Progress Notes (Signed)
CARDIAC REHAB PHASE I   PRE:  Rate/Rhythm: 82 SR    BP: sitting 128/88    SaO2: 93 2L  MODE:  Ambulation: 350 ft   POST:  Rate/Rhythm: 93 SR    BP: sitting 160/63    SaO2: 88 RA  Assist x2 to stand, pt pushed on arms quickly and efficiently.  Tol walk well with RW.  SaO2 90 RA at half way, 88 RA after walk.  Returned to recliner on 2L.  Pt very agreeable, sts he feels well.  Will f/u.  Elissa Lovett [place] CES, ACSM

## 2011-10-20 NOTE — Clinical Documentation Improvement (Signed)
SVT DOCUMENTATION CLARIFICATION QUERY  If you agree with a suggested condition, please include it in your documentation in the patient's medical record.  In responding to this query please exercise your independent judgment. The fact that a query is asked, does not imply that any particular answer is desired or expected.                                                                                          10/20/11   Gina H. Collins, [place],   In a better effort to capture your patient's severity of illness, reflect appropriate length of stay and utilization of resources, a review of the patient medical record has revealed the following indicators:  Based on your clinical judgement, please document the TYPE of SVT or other dysrhythmia requiring IV Amiodarone, in the progress notes and discharge summary:   - Paroxsymal   - Sustained   - Postoperative   - Other Dysrhythmia   - Unable to Clinically Determine    Supporting Information:   "Brief run of SVT this am. Now on IV Amiodarone. In NSR."  10/18/11 CTS progress note    Reviewed:  Query documented by Coral Ceo 10/22/11  Thank You!   Jerral Ralph RN BSN  Certified Clinical Documentation Specialist  Cell - [phone]  Health Information Management  [place]

## 2011-10-21 ENCOUNTER — Encounter (HOSPITAL_COMMUNITY): Payer: Self-pay | Admitting: Cardiothoracic Surgery

## 2011-10-21 DIAGNOSIS — IMO0001 Reserved for inherently not codable concepts without codable children: Secondary | ICD-10-CM

## 2011-10-21 DIAGNOSIS — E1165 Type 2 diabetes mellitus with hyperglycemia: Secondary | ICD-10-CM

## 2011-10-21 LAB — GLUCOSE, CAPILLARY
Glucose-Capillary: 136 mg/dL — ABNORMAL HIGH (ref 70–99)
Glucose-Capillary: 133 mg/dL — ABNORMAL HIGH (ref 70–99)
Glucose-Capillary: 134 mg/dL — ABNORMAL HIGH (ref 70–99)
Glucose-Capillary: 125 mg/dL — ABNORMAL HIGH (ref 70–99)

## 2011-10-21 MED ORDER — INSULIN GLARGINE 100 UNIT/ML ~~LOC~~ SOLN: 34.0000 [IU] | Freq: Every day | SUBCUTANEOUS | Status: DC

## 2011-10-21 MED ORDER — AMIODARONE HCL 200 MG PO TABS
200.0000 mg | ORAL_TABLET | Freq: Two times a day (BID) | ORAL | Status: DC
  Administered 2011-10-21 – 2011-10-22 (×3): 200 mg via ORAL
  Filled 2011-10-21 (×4): qty 1

## 2011-10-21 MED ORDER — AMIODARONE HCL 200 MG PO TABS: 200.0000 mg | ORAL_TABLET | Freq: Two times a day (BID) | ORAL | Status: DC

## 2011-10-21 MED ORDER — POTASSIUM CHLORIDE CRYS ER 20 MEQ PO TBCR: 20.0000 meq | EXTENDED_RELEASE_TABLET | Freq: Every day | ORAL | Status: DC

## 2011-10-21 MED ORDER — FUROSEMIDE 40 MG PO TABS: 40.0000 mg | ORAL_TABLET | Freq: Every day | ORAL | Status: DC

## 2011-10-21 MED ORDER — OXYCODONE HCL 5 MG PO TABS: 5.0000 mg | ORAL_TABLET | ORAL | Status: AC | PRN

## 2011-10-21 NOTE — Progress Notes (Signed)
CARDIAC REHAB PHASE I   PRE:  Rate/Rhythm: 78 SR    BP: sitting 118/58    SaO2: 90 1L  MODE:  Ambulation: 400 ft   POST:  Rate/Rhythm: 90    BP: sitting 126/66     SaO2: 98 RA on ear  Pt tol well, just needs assistance to stand (used assist x2).  Needs RW to walk.  Tried RA and SaO2 88-89 RA on finger and 97 RA on ear.  Left off O2 and notified RN. Return to chair. [phone]  Harriet Masson CES, ACSM

## 2011-10-21 NOTE — Progress Notes (Addendum)
6 Days Post-Op Procedure(s) (LRB): AORTIC VALVE REPLACEMENT (AVR) (N/A) Subjective: Feels well.  Breathing stable.  Down to 1L, and cough nearly resolved.  No complaints.  Several episodes of bradycardia overnight to 50s, and 1 2 second pause around 0200.  Pt asymptomatic.  Objective: Vital signs in last 24 hours: Temp:  [98.5 F (36.9 C)-99.3 F (37.4 C)] 98.5 F (36.9 C) (11/15 0628) Pulse Rate:  [72-80] 72  (11/15 0628) Cardiac Rhythm:  [-] Normal sinus rhythm (11/14 2040) Resp:  [19-20] 19  (11/15 0628) BP: (115-160)/(63-68) 133/66 mmHg (11/15 0628) SpO2:  [91 %-94 %] 91 % (11/15 0628) Weight:  [223 lb 1.7 oz (101.2 kg)] 223 lb 1.7 oz (101.2 kg) (11/15 1610)  Hemodynamic parameters for last 24 hours:    Intake/Output from previous day: 11/14 0701 - 11/15 0700 In: 1763 [P.O.:1760; I.V.:3] Out: 1350 [Urine:1350] Intake/Output this shift:    General appearance: no distress Heart: regular rate and rhythm Lungs: diminished breath sounds bibasilar Extremities: no edema, redness or tenderness in the calves or thighs and trace LE edema Wound: Clean, dry, no erythema  Lab Results:  [facility] 10/20/11 0630 10/19/11 0739  WBC 6.4 8.7  HGB 8.6* 8.9*  HCT 27.5* 28.7*  PLT 128* 112*   BMET:  Basename 10/19/11 0739  NA 140  K 3.6  CL 102  CO2 30  GLUCOSE 150*  BUN 26*  CREATININE 0.82  CALCIUM 9.1    PT/INR: No results found for this basename: LABPROT,INR in the last 72 hours ABG    Component Value Date/Time   PHART 7.367 10/15/2011 2146   HCO3 23.6 10/15/2011 2146   TCO2 23 10/16/2011 1813   ACIDBASEDEF 2.0 10/15/2011 2146   O2SAT 93.0 10/15/2011 2146   CBG (last 3)   Basename 10/21/11 0627 10/20/11 2115 10/20/11 1630  GLUCAP 136* 131* 166*    Assessment/Plan: S/P Procedure(s) (LRB): AORTIC VALVE REPLACEMENT (AVR) (N/A) Mobilize Diuresis DM- sugars stable. CV- bradycardic episodes.  Will decrease Amio to 200 mg bid.  May need to D/C beta blocker if  persists.   Pulm toilet, wean O2.   Home 1-2 days.   LOS: 6 days    COLLINS,GINA H 10/21/2011   patient examined and medical record reviewed,agree with above note. VAN TRIGT III,PETER 10/21/2011

## 2011-10-21 NOTE — Discharge Summary (Signed)
Discharge Summary  Casey Gilmore 13-Aug-1941 70 y.o. [id number]  10/15/2011    Admitting Diagnosis: aortic stenosis  Discharge Diagnosis:  Patient Active Problem List  Diagnoses  . Aortic stenosis  . Diabetes mellitus  . 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 male who presents for evaluation of recently diagnosed moderate to severe aortic stenosis. The patient has significant dyspnea on exertion but denies orthopnea or PND. 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 This was treated with endoscopic argon laser ablation therapy. However the patient apparently aspirated and developed pneumonia. He had positive cardiac enzymes and a 2-D echo was performed which showed moderate to severe aortic stenosis with left ventricular hypertrophy, preserved LV systolic function and abnormal LV diastolic function. Transvalvular gradient was 40-50 mmHg. The aortic valve area was 0.9 1.0. There is no significant mitral valve disease. There's no significant pericardial effusion.  Patient subsequently underwent a left heart catheterization  by Dr. Mayford Knife. The aortic valve could not be crossed for a ventriculogram. Coronary showed no significant disease. PA pressures were normal at 35/14. Cardiac output was 5.5 L per minute. Right atrial pressure was 8 mm mercury.  Because of the patient's class III symptoms and significant aortic stenosis he was seen by Dr. Donata Clay for consideration of aortic valve replacement.Dr. Donata Clay reviewed his films and after evaluation of the patient, agreed with the need for surgical intervention.    Hospital Course:  The patient was admitted to [facility]   On 10/15/2011.  All risks, benefits  and alternatives of surgery were explained in detail, and the patient agreed to proceed. The patient was taken to the operating room and underwent aortic valve replacement as described above.  The postoperative course was notable for paroxysmal supraventricular tachycardia.He was treated with amiodarone and subsequently he is maintaining sinus rhythm. He did develop some bradycardia on amiodarone  and his dose has been decreased accordingly.His blood sugars have been stable and he has been restarted on his home dose of Amaryl and a slightly lower dose of Lantus. He has had a mild acute blood loss anemia and has been started on iron. He is ambulating in halls without difficulty. He is tolerating a regular diet. He has been weaned from supplemental oxygen and is maintaining sats greater than 90% on room air.      Labs:  [facility] 10/19/11 0739  NA 140  K 3.6  CL 102  CO2 30  GLUCOSE 150*  BUN 26*  CALCIUM 9.1    Basename 10/20/11 0630 10/19/11 0739  WBC 6.4 8.7  HGB 8.6* 8.9*  HCT 27.5* 28.7*  PLT 128* 112*   No results found for this basename: INR:2 in the last 72 hours  Discharge Medications:  Casey, Gilmore  Home Medication Instructions [id number]   Printed on:10/21/11 1432  Medication Information                    ezetimibe-simvastatin (VYTORIN) 10-40 MG per tablet Take 1 tablet by mouth at bedtime.             cyanocobalamin 1000 MCG tablet Take 1,000 mcg by mouth  daily.             ferrous sulfate 325 (65 FE) MG tablet Take 325 mg by mouth daily with breakfast.             aspirin 81 MG tablet Take 81 mg by mouth daily.             glimepiride (AMARYL) 4 MG tablet Take 4 mg by mouth daily before breakfast.             metoprolol tartrate (LOPRESSOR) 25 MG tablet Take 12.5 mg by mouth 2 (two) times daily. Take half 25mg  tablet twice daily           omeprazole (PRILOSEC) 40 MG capsule Take 40 mg by mouth daily.            amiodarone (PACERONE) 200 MG  tablet Take 1 tablet (200 mg total) by mouth 2 (two) times daily.           insulin glargine (LANTUS) 100 UNIT/ML injection Inject 34 Units into the skin at bedtime.           oxyCODONE (OXY IR/ROXICODONE) 5 MG immediate release tablet Take 1-2 tablets (5-10 mg total) by mouth every 3 (three) hours as needed.           furosemide (LASIX) 40 MG tablet Take 1 tablet (40 mg total) by mouth daily.           potassium chloride SA (K-DUR,KLOR-CON) 20 MEQ tablet Take 1 tablet (20 mEq total) by mouth daily.              Discharge Instructions:  The patient is asked to refrain from driving, heavy lifting or strenuous activity.  Continue ambulating daily and use of incentive spirometer.  Shower daily and clean incisions with soap and water.   Discharge Followup:  He will make an appointment to see Dr. Mayford Knife in 2 weeks. He will see Dr. Zenaida Niece Trigt's PA on December 10 with a chest x-ray.  Casey Gilmore 10/21/2011, 2:32 PM         ADDENDUM:  Casey Gilmore to maintain sats > 90%.  Dr. Donata Clay has recommended home Gilmore, and this will be arranged by the case manager prior to D/C home.

## 2011-10-21 NOTE — Progress Notes (Signed)
Pt evaluated for long term disease management services with [facility] Management program as a benefit of [employer]. RN case manager will do a post d/c transition of care call and home visits for assessments and for DM, HTN, CAD, COPD and other needs. Pt states he lives alone and his son will be able to assist post discharge.   Brooke Bonito C. Roena Malady, RN, MS, CCM [facility] Liaison [facility] [phone]

## 2011-10-21 NOTE — Progress Notes (Signed)
PT Cancellation Note  ___Treatment cancelled today due to medical issues with patient which prohibited therapy  ___ Treatment cancelled today due to patient receiving procedure or test   ___ Treatment cancelled today due to patient's refusal to participate   _x__ Treatment cancelled today due to- mixed up cardiac rehab schedule.  PT is suppose to see patient Tues-Wed-Fri PMs.  Cardiac rehab did walk with patient this afternoon.  PT to follow tomorrow.      Signature: Rollene Rotunda. Fermon Ureta, PT, DPT [phone]

## 2011-10-22 LAB — GLUCOSE, CAPILLARY
Glucose-Capillary: 155 mg/dL — ABNORMAL HIGH (ref 70–99)
Glucose-Capillary: 136 mg/dL — ABNORMAL HIGH (ref 70–99)

## 2011-10-22 NOTE — Progress Notes (Signed)
Pt.'s o2 sat was 88% on room air at rest

## 2011-10-22 NOTE — Progress Notes (Signed)
Pt o'2 sat was 88 at rest

## 2011-10-22 NOTE — Progress Notes (Signed)
Physical Therapy Treatment Patient Details Name: Casey Gilmore MRN: [id number] DOB: 02-17-1941 Today's Date: 10/22/2011  PT Assessment/Plan  PT - Assessment/Plan Comments on Treatment Session: Patient doing well with mobility/gait.  Needs to use O2 at home. PT Plan: Discharge plan remains appropriate;Frequency remains appropriate PT Frequency: Min 3X/week Follow Up Recommendations: Home health PT Equipment Recommended: None recommended by PT PT Goals  Acute Rehab PT Goals PT Transfer Goal: Sit to Stand/Stand to Sit - Progress: Progressing toward goal PT Goal: Ambulate - Progress: Met  PT Treatment Precautions/Restrictions  Precautions Precautions: Sternal Precaution Comments: Reviewed sternal precautions especially with sit to stand Required Braces or Orthoses: No Restrictions Weight Bearing Restrictions: Yes LLE Weight Bearing: Partial weight bearing LLE Partial Weight Bearing Percentage or Pounds: PWB on bil UEs <20% Other Position/Activity Restrictions: Decreased wt. bearing bil UEs for sternal precautions Mobility (including Balance) Bed Mobility Bed Mobility: No Transfers Transfers: Yes Sit to Stand: 5: Supervision;With armrests;From chair/3-in-1 Sit to Stand Details (indicate cue type and reason): Cues to minimize use of UE's due to sternal precautions Stand to Sit: 5: Supervision;To chair/3-in-1;With upper extremity assist Ambulation/Gait Ambulation/Gait: Yes Ambulation/Gait Assistance: 5: Supervision Ambulation/Gait Assistance Details (indicate cue type and reason): Cues to slow speed to minimize dyspnea Ambulation Distance (Feet): 225 Feet Assistive device: Rolling walker Gait Pattern: Step-through pattern;Decreased step length - left;Decreased hip/knee flexion - left;Trunk flexed (left hip fusion impacting gait) Stairs: No      End of Session PT - End of Session Equipment Utilized During Treatment: Gait belt Activity Tolerance: Patient tolerated  treatment well;Other (comment) (However O2 sats dropped with gait on RA.) Patient left: in chair;with family/visitor present Nurse Communication: Other (comment) (O2 sats on RA with gait.) General Behavior During Session: [facility] for tasks performed Cognition: [facility] for tasks performed  Vena Austria [phone] 10/22/2011, 3:13 PM

## 2011-10-22 NOTE — Progress Notes (Signed)
PT WITH LOW O2 SATS.  RESTING ROOM AIR SAT 88%.  WILL NEED HOME O2.  [facility] TO [facility] UP.  PT FOR DC LATER TODAY WITH SON.

## 2011-10-22 NOTE — Progress Notes (Signed)
2130 - 1010 cardiac rehab Pt education done for discharge. 02 sat on room air 89% on finger check, 96% on ear lobe check. Pt referred to out pt cardiac rehab in [place].   Rosalie Doctor

## 2011-10-22 NOTE — Progress Notes (Signed)
  7 Days Post-Op Procedure(s) (LRB): AORTIC VALVE REPLACEMENT (AVR) (N/A) Subjective: Had been weaned off O2 yesterday, and sats stayed >90%.  Currently back on 1L.  Pt denies SOB or cough. No more episodes of bradycardia or pauses.   Objective: Vital signs in last 24 hours: Patient Vitals for the past 24 hrs:  BP Temp Temp src Pulse Resp SpO2  10/22/11 0609 124/69 mmHg 98.2 F (36.8 C) Oral 73  19  91 %  10/21/11 2144 118/7 mmHg - - 77  - -  10/21/11 2120 133/67 mmHg 98.1 F (36.7 C) Oral 76  19  -  10/21/11 1349 117/67 mmHg 98.8 F (37.1 C) Oral 70  18  93 %    Hemodynamic parameters for last 24 hours:    Intake/Output from previous day: 11/15 0701 - 11/16 0700 In: 720 [P.O.:720] Out: 1225 [Urine:1225] Intake/Output this shift:    General appearance: no distress Heart: regular rate and rhythm Lungs: clear to auscultation bilaterally Extremities: no edema, redness or tenderness in the calves or thighs Wound: Clean and dry  Lab Results: CBC: Basename 10/20/11 0630 10/19/11 0739  WBC 6.4 8.7  HGB 8.6* 8.9*  HCT 27.5* 28.7*  PLT 128* 112*   BMET:  Basename 10/19/11 0739  NA 140  K 3.6  CL 102  CO2 30  GLUCOSE 150*  BUN 26*  CREATININE 0.82  CALCIUM 9.1    PT/INR: No results found for this basename: LABPROT,INR in the last 72 hours ABG:  INR: Will add last result for INR, ABG once components are confirmed Will add last 4 CBG results once components are confirmed  Assessment/Plan: S/P Procedure(s) (LRB): AORTIC VALVE REPLACEMENT (AVR) (N/A) d/c pacing wires Plan for discharge: see discharge orders   LOS: 7 days    COLLINS,GINA H 10/22/2011

## 2011-10-22 NOTE — Progress Notes (Signed)
MET WITH PT TO DISCUSS DC PLANS.  PTA, PT INDEPENDENT, LIVES ALONE; HIS SON LIVES NEXT DOOR.  PT STATES SON WILL STAY WITH HIM AT DC TO PROVIDE 24HR CARE.  WILL FOLLOW FOR HOME NEEDS.

## 2011-11-08 ENCOUNTER — Other Ambulatory Visit: Payer: Self-pay | Admitting: Thoracic Surgery (Cardiothoracic Vascular Surgery)

## 2011-11-08 DIAGNOSIS — I359 Nonrheumatic aortic valve disorder, unspecified: Secondary | ICD-10-CM

## 2011-11-15 ENCOUNTER — Ambulatory Visit (INDEPENDENT_AMBULATORY_CARE_PROVIDER_SITE_OTHER): Payer: Self-pay | Admitting: Physician Assistant

## 2011-11-15 ENCOUNTER — Ambulatory Visit
Admission: RE | Admit: 2011-11-15 | Discharge: 2011-11-15 | Disposition: A | Discharge status: Home / Self Care | Payer: Medicare Other | Source: Ambulatory Visit | Attending: Thoracic Surgery (Cardiothoracic Vascular Surgery) | Admitting: Thoracic Surgery (Cardiothoracic Vascular Surgery)

## 2011-11-15 VITALS — BP 137/79 | HR 93 | Resp 18 | Ht 70.0 in | Wt 222.0 lb

## 2011-11-15 DIAGNOSIS — Z09 Encounter for follow-up examination after completed treatment for conditions other than malignant neoplasm: Secondary | ICD-10-CM

## 2011-11-15 DIAGNOSIS — I359 Nonrheumatic aortic valve disorder, unspecified: Secondary | ICD-10-CM

## 2011-11-15 NOTE — Progress Notes (Addendum)
HPI:  Patient returns for routine postoperative follow-up having undergone AVR on 10/15/2011 by Dr. Donata Clay. The patient's early postoperative recovery while in the hospital was notable for SVT.  He was placed on Amiodarone , which needed to be decreased secondary to bradycardia. Since hospital discharge the patient reports occasional rib soreness with sneezing or coughing. Denies chest pain, shortness of breath, fever, or chills.   Current Outpatient Prescriptions  Medication Sig Dispense Refill  . amiodarone (PACERONE) 200 MG tablet Take 200 mg by mouth daily.        Marland Kitchen aspirin 81 MG tablet Take 81 mg by mouth daily.        . cyanocobalamin 1000 MCG tablet Take 1,000 mcg by mouth daily.        Marland Kitchen ezetimibe-simvastatin (VYTORIN) 10-40 MG per tablet Take 1 tablet by mouth at bedtime.        . ferrous sulfate 325 (65 FE) MG tablet Take 325 mg by mouth daily with breakfast.        . furosemide (LASIX) 40 MG tablet Take 40 mg by mouth daily.        Marland Kitchen glimepiride (AMARYL) 4 MG tablet Take 4 mg by mouth daily before breakfast.        . insulin glargine (LANTUS) 100 UNIT/ML injection Inject 34 Units into the skin at bedtime.  10 mL    . metoprolol tartrate (LOPRESSOR) 25 MG tablet Take 12.5 mg by mouth 2 (two) times daily. Take half 25mg  tablet twice daily      . omeprazole (PRILOSEC) 40 MG capsule Take 40 mg by mouth daily.       Marland Kitchen oxycodone (OXY-IR) 5 MG capsule Take 5 mg by mouth every 4 (four) hours as needed.        . potassium chloride SA (K-DUR,KLOR-CON) 20 MEQ tablet Take 20 mEq by mouth daily.          Physical Exam Cardiovascular: Regular rate and rhythm S1-S2 with a grade 1 systolic flow murmur Pulmonary: Slightly decreased at the bases; no relevant, wheezes, or rhonchi. Abdomen: Soft, nontender, bowel sounds present. Extremities: No lower sternum and edema. Sternal wound: Clean, dry, well-healed. No signs of infection. Sternum is solid.  Diagnostic Tests: PA lateral chest  x-ray done today shows left lower lobe density( scarring versus atelectasis) and tiny bilateral pleural effusions (decreased from previous chest xray.)   Impression and Plan: Overall, [name] is surgically stable status post AVR. He has  seen cardiology in followup (and has another followup appointment in one week). They have decreased his amiodarone 200 mg once daily. There have been no other changes to his discharge medications.  Patient was instructed that providing is not taking any narcotics, he may begin driving short distances to patient did state he has are to be done driving. Is encouraged to drive only during the day for the next week and then he may gradually increase his routine duration as tolerates. He states he is ambulating on his own and has  not been contacted by cardiac rehabilitation. He will most likely not participate in cardiac rehabilitation, but will contact us if he changes his mind. 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 His H&H was checked during his followup appointment with cardiology was found to be 10.9 /34.7. He  is still on iron 3 times daily. He will need to follow up with his medical doctor regarding this. Patient was also  instructed to continue with sternal precautions i.e. no lifting more than 10 pounds for the next 3-4 weeks. He will return to see Dr. Donata Clay on an as needed basis.

## 2011-12-06 ENCOUNTER — Encounter: Payer: Self-pay | Admitting: *Deleted

## 2012-01-23 IMAGING — CR DG CHEST 1V PORT
2 series · 2 of 2 positions shown · non-contrast
Comparison: Chest radiograph [DATE]

CLINICAL DATA: Postop CABG

PORTABLE CHEST - 1 VIEW

[view not recorded (1 of 2)]
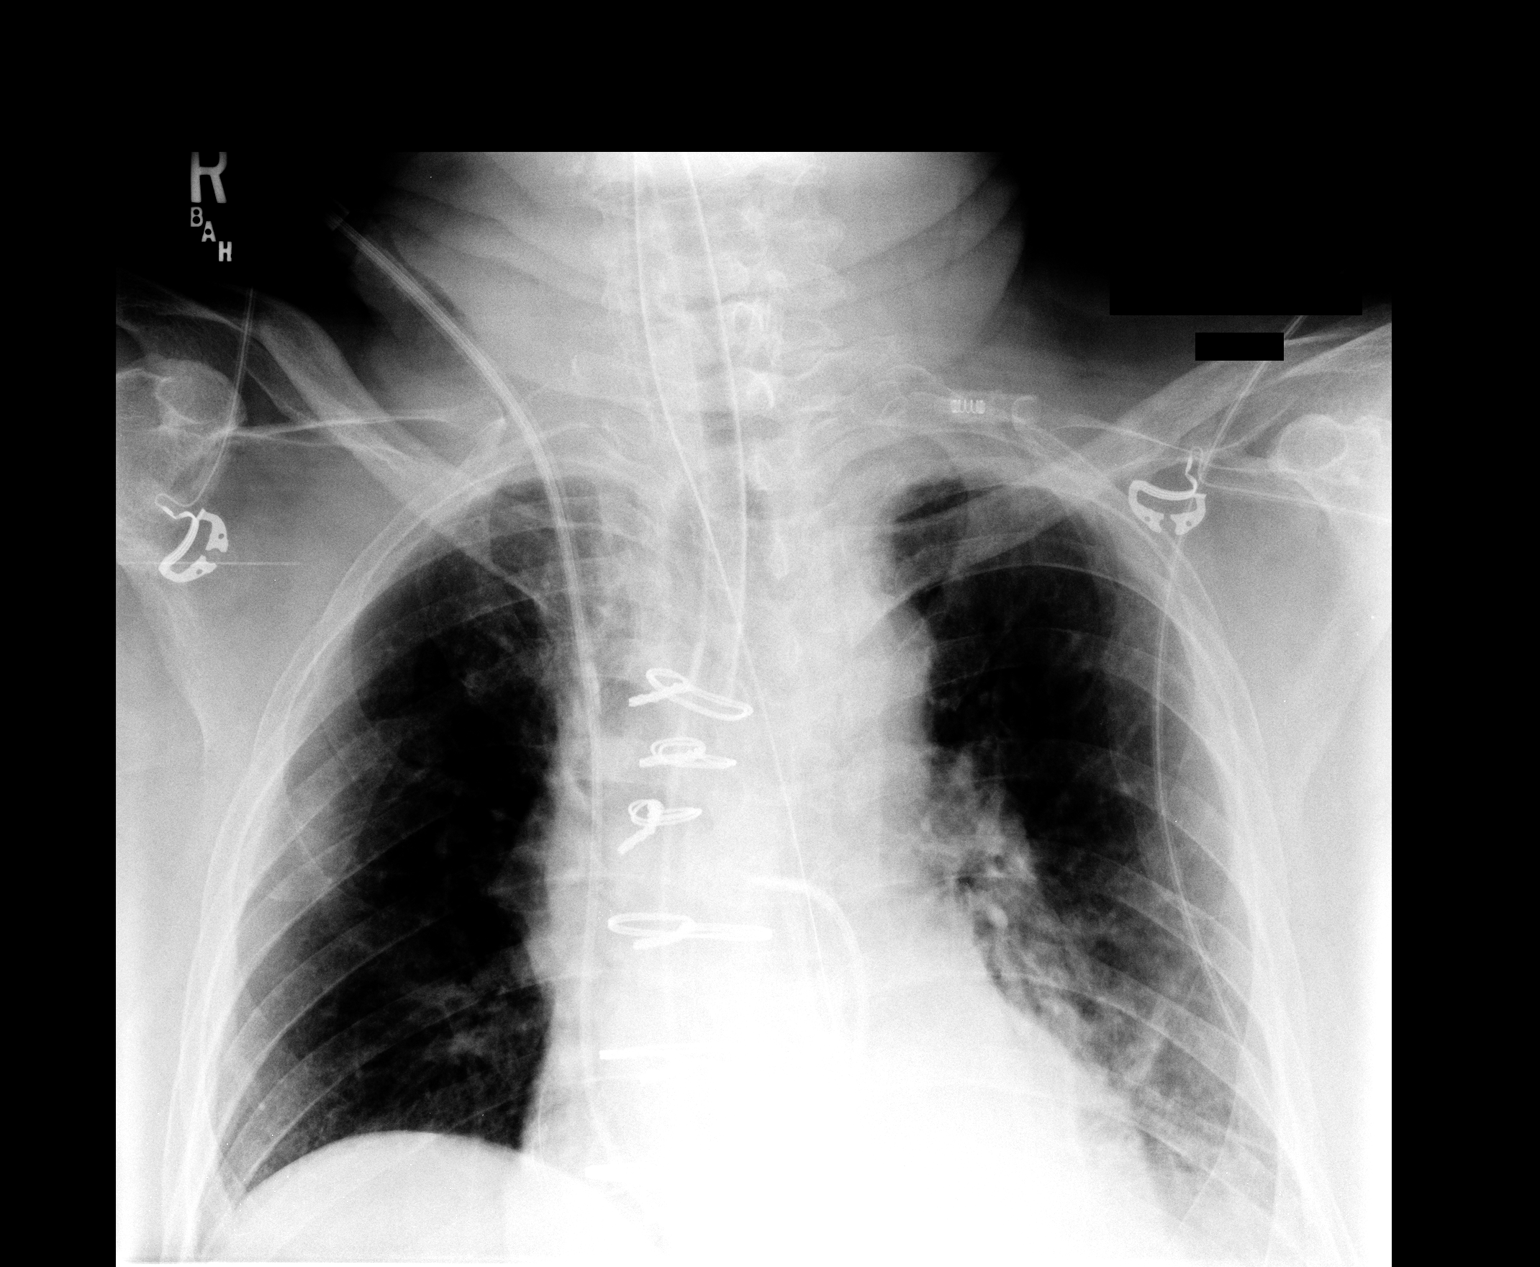

[view not recorded (2 of 2)]
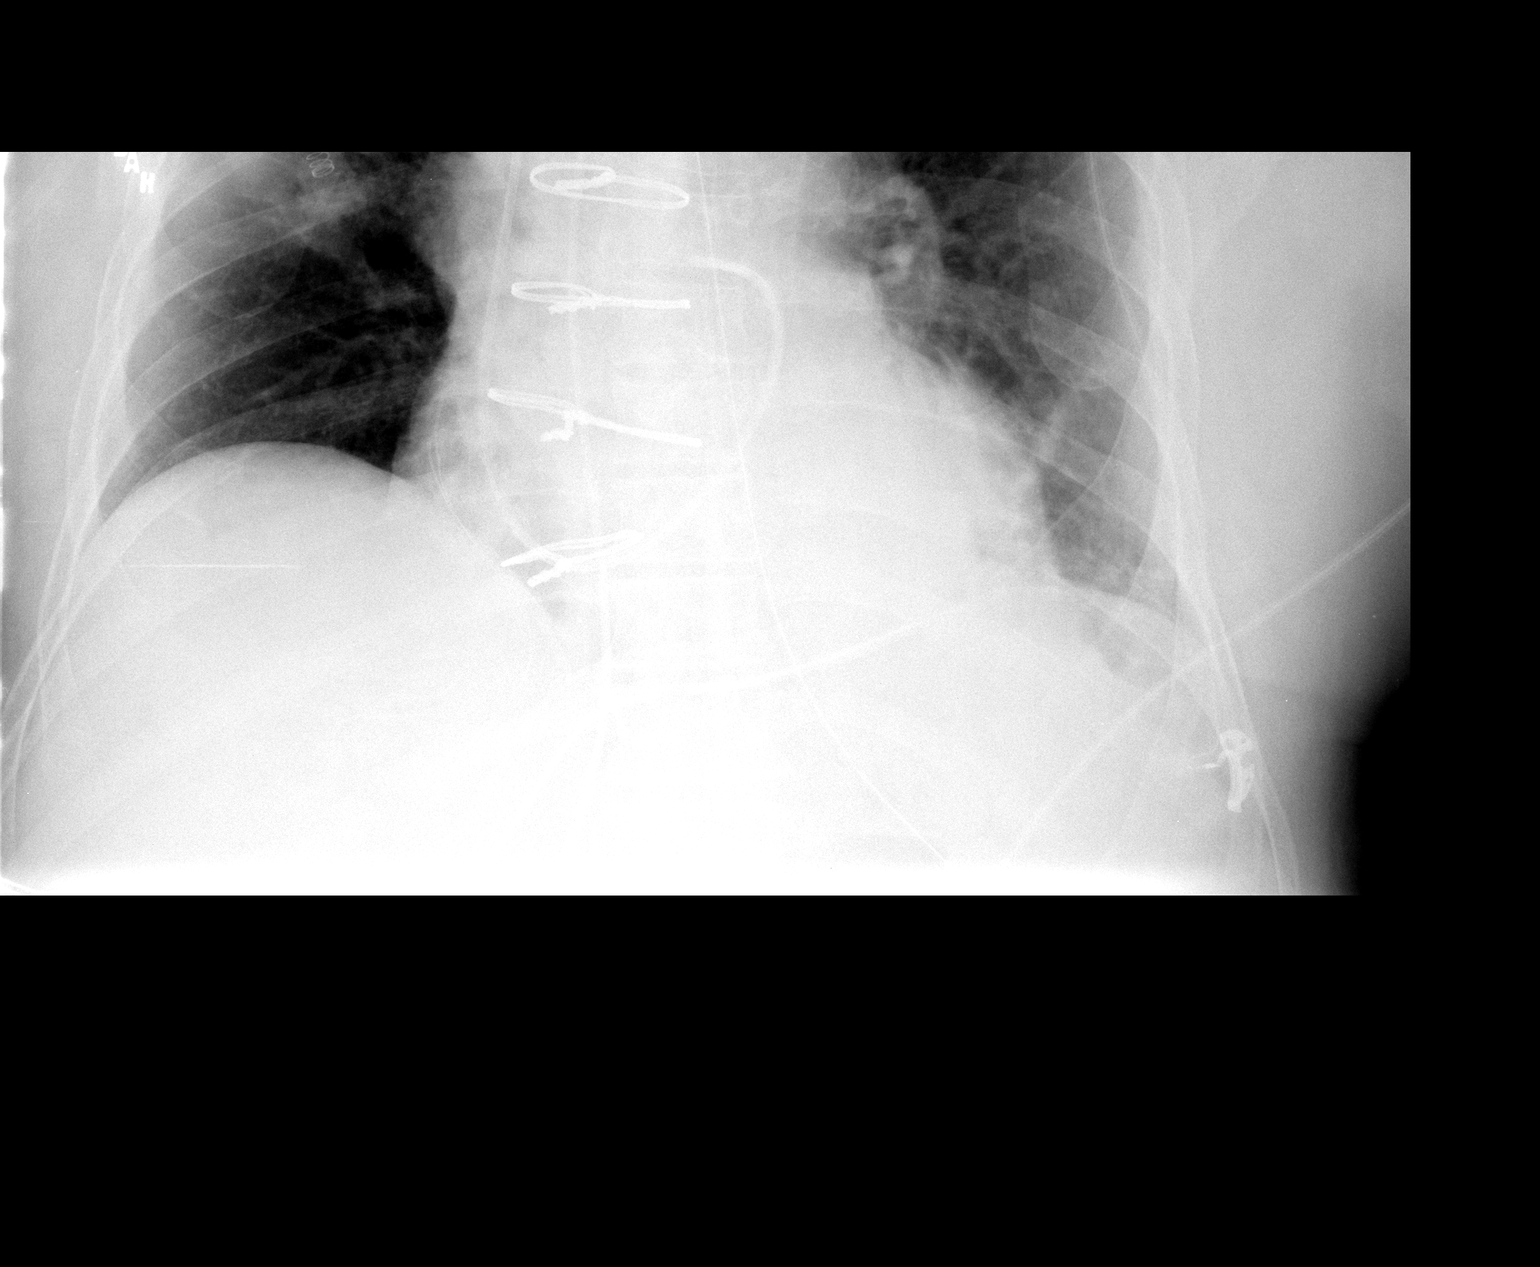

[2 of 2 positions shown; findings below may reference images not displayed]

FINDINGS: Interval midline sternotomy. Endotracheal tube and NG
tube appear in good position.  Swan-Ganz catheter in place with tip
the main pulmonary artery.  No pneumothorax.  Slight increase in
left basilar atelectasis.
IMPRESSION: 1.  Interval cardiotomy without complication.
2.  Slight increase in the left basilar atelectasis.

## 2012-01-24 IMAGING — CR DG CHEST 1V PORT
1 series · 1 of 1 positions shown · non-contrast
Comparison: 08/26/2011; 10/12/2011; 09/06/2011

CLINICAL DATA: Postoperative exam

PORTABLE CHEST - 1 VIEW

[AP]
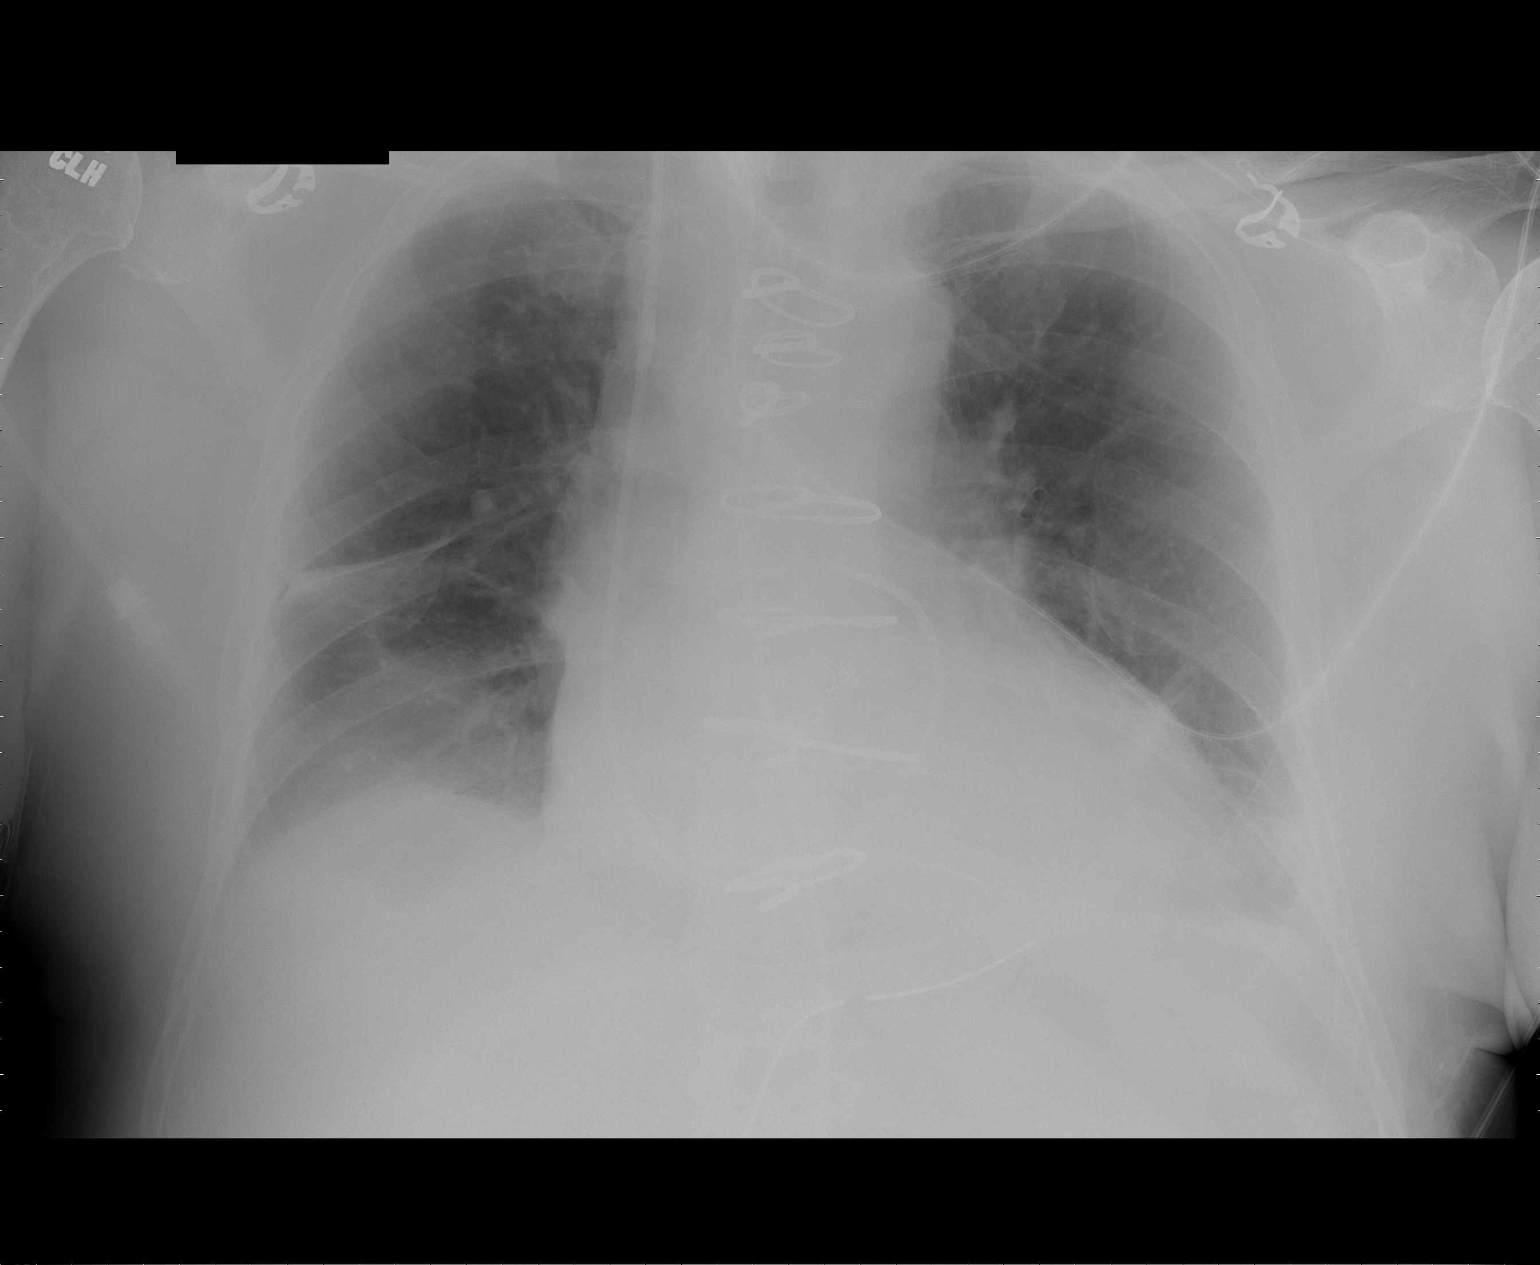

[1 of 1 positions shown; findings below may reference images not displayed]

FINDINGS: Grossly unchanged cardiac silhouette and mediastinal contours post
median sternotomy and valve replacement.  Interval extubation and
removal of enteric tube.  Otherwise stable position of support
apparatus including mediastinal drains/chest tubes.  Right jugular
approach central venous catheter tip overlies the location of the
right main pulmonary artery outflow tract.  Lung volumes remain
reduced with small bilateral effusions and bibasilar heterogeneous
opacities.  Grossly unchanged bones.
IMPRESSION: 1.  Interval extubation and removal an enteric tube.  Otherwise,
stable position of support apparatus.  No pneumothorax.
2.  Decreased lung volumes with small effusions and bibasilar
opacities, likely atelectasis.

## 2012-02-23 IMAGING — CR DG CHEST 2V
2 series · 2 of 2 positions shown · non-contrast
Comparison: Chest x-ray 10/20/2011.

CLINICAL DATA: Bypass surgery.

CHEST - 2 VIEW

[view not recorded (1 of 2)]
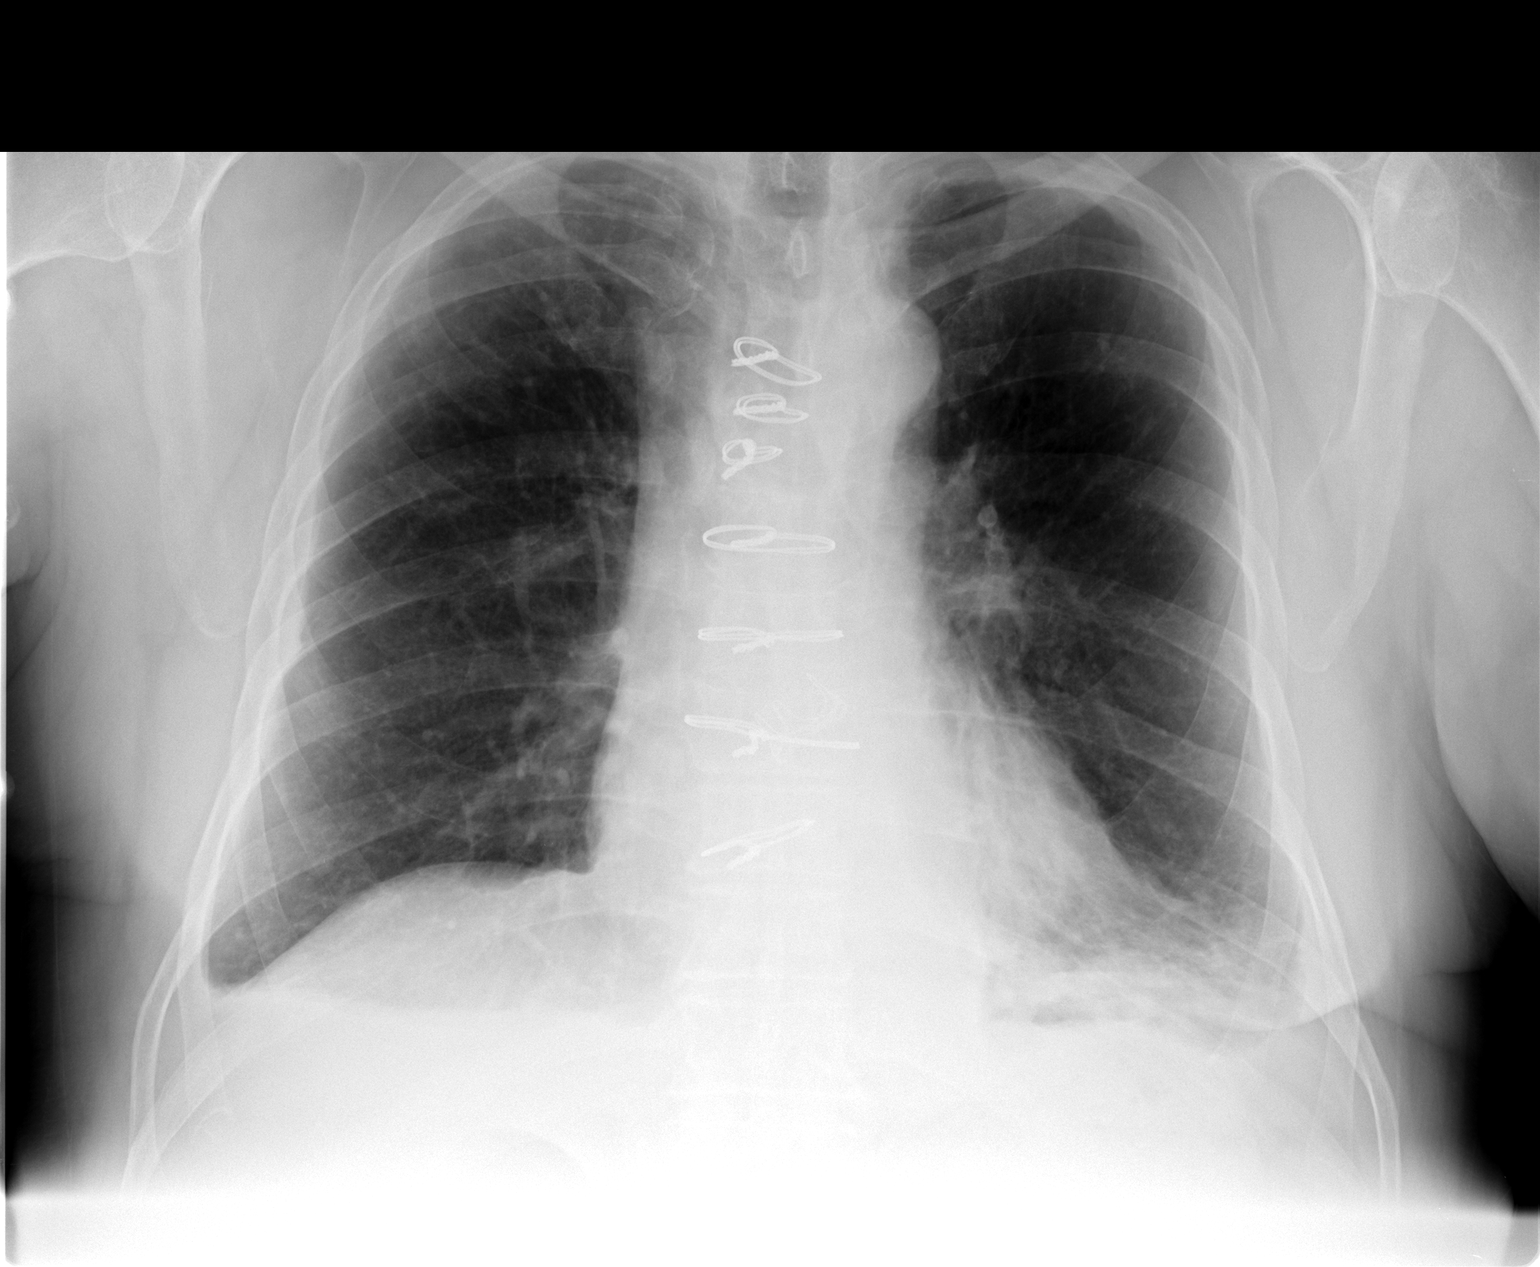

[view not recorded (2 of 2)]
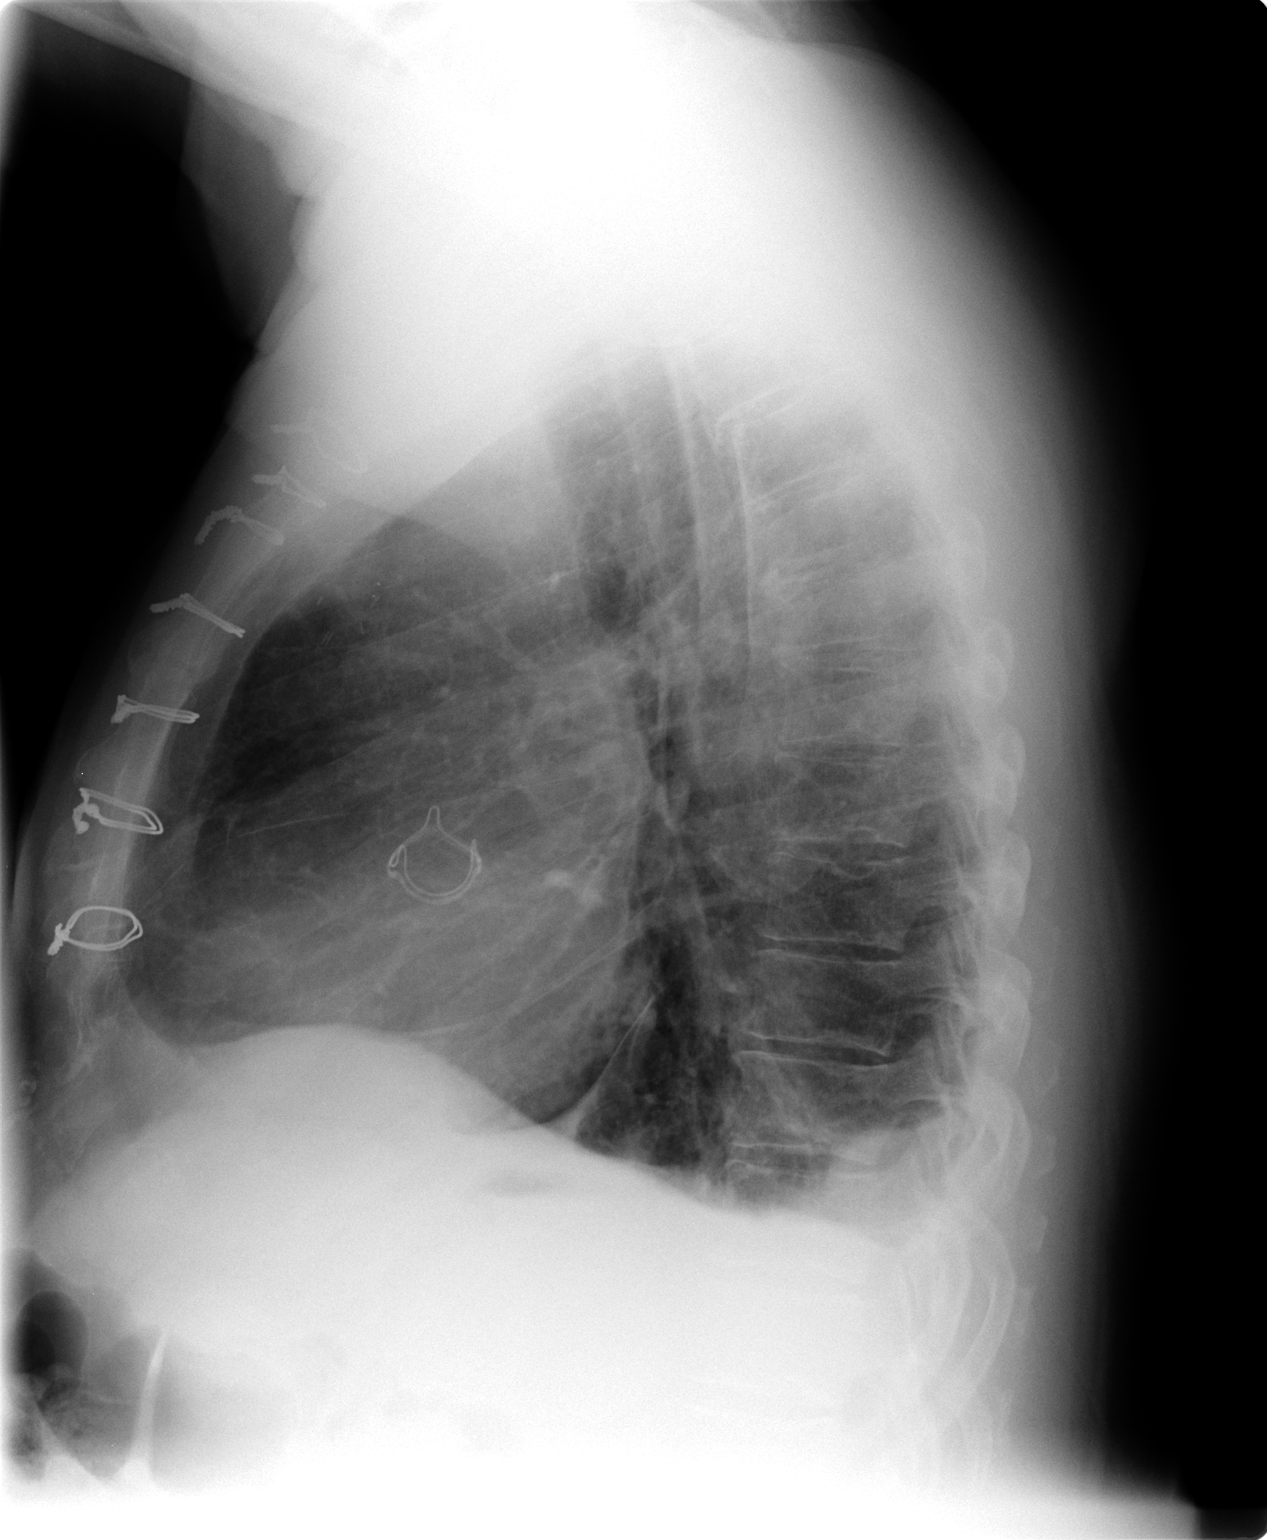

[2 of 2 positions shown; findings below may reference images not displayed]

FINDINGS: The cardiac silhouette, mediastinal and hilar contours
are within normal limits.  Stable surgical changes from valve
replacement surgery.  There is persistent left lower lobe density
which may reflect scarring and atelectasis.  Small effusions are
noted.
IMPRESSION: Left lower lobe density is likely a combination of atelectasis and
scarring.
Very small pleural effusions.

## 2012-12-21 ENCOUNTER — Other Ambulatory Visit (HOSPITAL_COMMUNITY): Payer: Self-pay | Admitting: Otolaryngology

## 2012-12-22 ENCOUNTER — Encounter (HOSPITAL_COMMUNITY): Payer: Self-pay | Admitting: Pharmacy Technician

## 2012-12-29 ENCOUNTER — Encounter (HOSPITAL_COMMUNITY)
Admission: RE | Admit: 2012-12-29 | Discharge: 2012-12-29 | Disposition: A | Discharge status: Home / Self Care | Payer: Medicare Other | Source: Ambulatory Visit | Attending: Otolaryngology | Admitting: Otolaryngology

## 2012-12-29 ENCOUNTER — Encounter (HOSPITAL_COMMUNITY): Payer: Self-pay

## 2012-12-29 ENCOUNTER — Encounter (HOSPITAL_COMMUNITY)
Admission: RE | Admit: 2012-12-29 | Discharge: 2012-12-29 | Disposition: A | Discharge status: Home / Self Care | Payer: Medicare Other | Source: Ambulatory Visit | Attending: Anesthesiology | Admitting: Anesthesiology

## 2012-12-29 HISTORY — DX: Gastrointestinal hemorrhage, unspecified: K92.2

## 2012-12-29 HISTORY — DX: Gastro-esophageal reflux disease without esophagitis: K21.9

## 2012-12-29 LAB — CBC
HCT: 38 % — ABNORMAL LOW (ref 39.0–52.0)
MCH: 26.7 pg (ref 26.0–34.0)
MCV: 84.6 fL (ref 78.0–100.0)
Platelets: 246 10*3/uL (ref 150–400)
RBC: 4.49 MIL/uL (ref 4.22–5.81)
WBC: 10.1 10*3/uL (ref 4.0–10.5)

## 2012-12-29 LAB — BASIC METABOLIC PANEL
CO2: 25 mEq/L (ref 19–32)
Calcium: 9.8 mg/dL (ref 8.4–10.5)
Chloride: 102 mEq/L (ref 96–112)
Creatinine, Ser: 0.95 mg/dL (ref 0.50–1.35)
Glucose, Bld: 94 mg/dL (ref 70–99)
Sodium: 142 mEq/L (ref 135–145)

## 2012-12-29 NOTE — Pre-Procedure Instructions (Signed)
BARTOLO MONTANYE  12/29/2012   Your procedure is scheduled on:  Friday, January 31st.  Report to [facility] at 6:45AM.  Call this number if you have problems the morning of surgery: [phone]   Remember:   Do not eat food or drink liquids after midnight.   Take these medicines the morning of surgery with A SIP OF WATER: Omeparzole (Prilosec).  May take Hydrocodone- Acetaminophen (Vicodin) if needed.   Do not wear jewelry, make-up or nail polish.  Do not wear lotions, powders, or perfumes. You may wear deodorant.  Do not shave 48 hours prior to surgery. Men may shave face and neck.  Do not bring valuables to the hospital.  Contacts, dentures or bridgework may not be worn into surgery.  Leave suitcase in the car. After surgery it may be brought to your room.  For patients admitted to the hospital, checkout time is 11:00 AM the day of  discharge.   Patients discharged the day of surgery will not be allowed to drive  home.  Name and phone number of your driver: _    Special Instructions: Shower using CHG 2 nights before surgery and the night before surgery.  If you shower the day of surgery use CHG.  Use special wash - you have one bottle of CHG for all showers.  You should use approximately 1/3 of the bottle for each shower.   Please read over the following fact sheets that you were given: Pain Booklet, Coughing and Deep Breathing and Surgical Site Infection Prevention

## 2012-12-29 NOTE — Progress Notes (Signed)
Mr Fairley says that he has had some chest pain "a couple times in the last year". He points just left of sternum,   Last time was a couple weeks ago.  Pt states it feels like you "mashed your finger, last just that long and goes away.".  Pt denies SOB, lightheadedness, diaphoresis.  Pt saw Dr Mayford Knife prior to Valve Replacement , but has not seen her seen then.  EKG post valve replacement showed NSR and todays unconfirmed shows "inferior -posterior infarct", age undetermined.  I will notify Edmonia Caprio.

## 2012-12-29 NOTE — Consult Note (Signed)
Anesthesia Consult:  Patient is a 72 year old male scheduled for laryngoscopy with biopsy, small laser-safe ETT on 01/05/13 by Dr. Jenne Pane.  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  PCP is Dr. Cam Hai.  I was asked to evaluated him today because he reported an episode of chest pain approximately 2 weeks ago.  Cardiologist is Dr. Armanda Magic.  He said he seen at her office twice following AVR, but not since.  He reports the episode of chest pain occurred for only a couple of seconds and was not associated with any other symptoms.  It occurred in his mid upper chest and did not radiate.  It felt like a "thump" and was rated at a 2/10 intensity.  He does not remember what he was doing when it occurred.  It went away on its own.  He denies SOB at rest, syncope/pre-syncope, palpitations, diaphoresis, edema.  He has chronic DOE with moderate exertion, like walking up a hill.  He feels this is at baseline.  He can do his own shopping, but activity is somewhat limited due to leg and back pain.  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   Intra-op TEE by Dr. Ivin Booty on 10/15/11 showed: Post-AVR findings showed mildly to moderately reduced LV systolic function, EF 40-45%, aortic prosthesis well seated, no leak.  Mild TR, trivial MR.  EKG on 12/29/12 showed NSR, non-specific intra-ventricular conduction delay.  He has had multiple EKGs done in 2012.  Overall, I think it appears similar to an EKG from 10/15/11.  Cardiac cath on 09/30/11 showed: 1. Normal coronary.  2. Moderate-to-severe aortic stenosis by echo that was done in August.  3. Normal left ventricular function by echo on August.  4. Normal  right heart pressures.   CXR on 12/29/12 showed no acute lung disease.  Preoperative labs noted.  Patient had a brief (few seconds) of atypical chest pain a few weeks ago.  Characteristics are atypical for cardiac etiology. He has not had any episodes since and coronaries were normal by cath in October 2012.  He has not had any syncope or increased SOB. Exam showed a pleasant male, in no acute distress.  Heart RRR, I did not appreciate any definite murmurs. Lungs clears. No carotid bruits or LE edema noted. He was told to seek immediate medical attention if any new, persistent chest pain; however, if no new symptoms then I would anticipate he could proceed as planned with this procedure.  Anesthesiologist agrees with this plan.  Update: Although patient reported that he had not seen Dr. Mayford Knife in the past year and did not recall a post-operative echo, I received records from [facility] that show an echo from 12/2011 and office note from 06/19/12.  One year follow-up was recommended at that time.  Echo on 12/23/11 showed LVEF 63.4%, unable to adequately assess LV function due to poor acoustic window, mild LAE, bioprosthetic AV with normal function, doppler findings suggestive of grade II diastolic dysfunction with elevated LA pressure.  As above, plan to proceed if no significant changes.  Shonna Chock, PA-C

## 2013-01-05 ENCOUNTER — Encounter (HOSPITAL_COMMUNITY): Payer: Self-pay | Admitting: *Deleted

## 2013-01-05 ENCOUNTER — Encounter (HOSPITAL_COMMUNITY): Payer: Self-pay | Admitting: Vascular Surgery

## 2013-01-05 ENCOUNTER — Ambulatory Visit (HOSPITAL_COMMUNITY)
Admission: RE | Admit: 2013-01-05 | Discharge: 2013-01-05 | Disposition: A | Discharge status: Home / Self Care | Payer: Medicare Other | Source: Ambulatory Visit | Attending: Otolaryngology | Admitting: Otolaryngology

## 2013-01-05 ENCOUNTER — Ambulatory Visit (HOSPITAL_COMMUNITY): Payer: Medicare Other | Admitting: Vascular Surgery

## 2013-01-05 ENCOUNTER — Encounter (HOSPITAL_COMMUNITY)
Admission: RE | Disposition: A | Discharge status: Home / Self Care | Payer: Self-pay | Source: Ambulatory Visit | Attending: Otolaryngology

## 2013-01-05 ENCOUNTER — Encounter (HOSPITAL_COMMUNITY): Payer: Self-pay | Admitting: Anesthesiology

## 2013-01-05 DIAGNOSIS — K219 Gastro-esophageal reflux disease without esophagitis: Secondary | ICD-10-CM | POA: Insufficient documentation

## 2013-01-05 DIAGNOSIS — Z96659 Presence of unspecified artificial knee joint: Secondary | ICD-10-CM | POA: Insufficient documentation

## 2013-01-05 DIAGNOSIS — E119 Type 2 diabetes mellitus without complications: Secondary | ICD-10-CM | POA: Insufficient documentation

## 2013-01-05 DIAGNOSIS — C320 Malignant neoplasm of glottis: Secondary | ICD-10-CM | POA: Insufficient documentation

## 2013-01-05 DIAGNOSIS — R011 Cardiac murmur, unspecified: Secondary | ICD-10-CM | POA: Insufficient documentation

## 2013-01-05 DIAGNOSIS — J383 Other diseases of vocal cords: Secondary | ICD-10-CM

## 2013-01-05 DIAGNOSIS — I252 Old myocardial infarction: Secondary | ICD-10-CM | POA: Insufficient documentation

## 2013-01-05 DIAGNOSIS — M129 Arthropathy, unspecified: Secondary | ICD-10-CM | POA: Insufficient documentation

## 2013-01-05 DIAGNOSIS — Z7982 Long term (current) use of aspirin: Secondary | ICD-10-CM | POA: Insufficient documentation

## 2013-01-05 DIAGNOSIS — Z794 Long term (current) use of insulin: Secondary | ICD-10-CM | POA: Insufficient documentation

## 2013-01-05 DIAGNOSIS — I10 Essential (primary) hypertension: Secondary | ICD-10-CM | POA: Insufficient documentation

## 2013-01-05 HISTORY — PX: LARYNGOSCOPY: SHX5203

## 2013-01-05 LAB — GLUCOSE, CAPILLARY: Glucose-Capillary: 93 mg/dL (ref 70–99)

## 2013-01-05 SURGERY — LARYNGOSCOPY
Anesthesia: General | Site: Mouth | Wound class: Clean Contaminated

## 2013-01-05 MED ORDER — LACTATED RINGERS IV SOLN
INTRAVENOUS | Status: DC
  Administered 2013-01-05: 10:00:00 via INTRAVENOUS

## 2013-01-05 MED ORDER — DEXAMETHASONE SODIUM PHOSPHATE 10 MG/ML IJ SOLN
INTRAMUSCULAR | Status: DC | PRN
  Administered 2013-01-05: 10 mg via INTRAVENOUS

## 2013-01-05 MED ORDER — HYDROMORPHONE HCL PF 1 MG/ML IJ SOLN
0.2500 mg | INTRAMUSCULAR | Status: DC | PRN
  Administered 2013-01-05: 0.5 mg via INTRAVENOUS

## 2013-01-05 MED ORDER — EPINEPHRINE HCL (NASAL) 0.1 % NA SOLN
NASAL | Status: DC | PRN
  Administered 2013-01-05: 30 mL via NASAL

## 2013-01-05 MED ORDER — PROPOFOL 10 MG/ML IV BOLUS
INTRAVENOUS | Status: DC | PRN
  Administered 2013-01-05: 75 mg via INTRAVENOUS
  Administered 2013-01-05: 100 mg via INTRAVENOUS

## 2013-01-05 MED ORDER — OXYCODONE HCL 5 MG PO TABS: 5.0000 mg | ORAL_TABLET | Freq: Once | ORAL | Status: DC | PRN

## 2013-01-05 MED ORDER — SUCCINYLCHOLINE CHLORIDE 20 MG/ML IJ SOLN
INTRAMUSCULAR | Status: DC | PRN
  Administered 2013-01-05 (×2): 100 mg via INTRAVENOUS

## 2013-01-05 MED ORDER — LACTATED RINGERS IV SOLN
INTRAVENOUS | Status: DC | PRN
  Administered 2013-01-05: 11:00:00 via INTRAVENOUS

## 2013-01-05 MED ORDER — ONDANSETRON HCL 4 MG/2ML IJ SOLN: 4.0000 mg | Freq: Once | INTRAMUSCULAR | Status: DC | PRN

## 2013-01-05 MED ORDER — LABETALOL HCL 5 MG/ML IV SOLN
INTRAVENOUS | Status: DC | PRN
  Administered 2013-01-05: 10 mg via INTRAVENOUS

## 2013-01-05 MED ORDER — MEPERIDINE HCL 25 MG/ML IJ SOLN
6.2500 mg | INTRAMUSCULAR | Status: DC | PRN
Start: 2013-01-05 — End: 2013-01-05

## 2013-01-05 MED ORDER — ONDANSETRON HCL 4 MG/2ML IJ SOLN
INTRAMUSCULAR | Status: DC | PRN
  Administered 2013-01-05: 4 mg via INTRAVENOUS

## 2013-01-05 MED ORDER — EPINEPHRINE HCL (NASAL) 0.1 % NA SOLN
NASAL | Status: AC
  Filled 2013-01-05: qty 30

## 2013-01-05 MED ORDER — HYDROMORPHONE HCL PF 1 MG/ML IJ SOLN
INTRAMUSCULAR | Status: AC
  Filled 2013-01-05: qty 1

## 2013-01-05 MED ORDER — 0.9 % SODIUM CHLORIDE (POUR BTL) OPTIME
TOPICAL | Status: DC | PRN
  Administered 2013-01-05: 1000 mL

## 2013-01-05 MED ORDER — LIDOCAINE HCL (CARDIAC) 20 MG/ML IV SOLN
INTRAVENOUS | Status: DC | PRN
  Administered 2013-01-05: 40 mg via INTRAVENOUS
  Administered 2013-01-05: 100 mg via INTRAVENOUS

## 2013-01-05 MED ORDER — FENTANYL CITRATE 0.05 MG/ML IJ SOLN
INTRAMUSCULAR | Status: DC | PRN
  Administered 2013-01-05: 25 ug via INTRAVENOUS
  Administered 2013-01-05: 175 ug via INTRAVENOUS

## 2013-01-05 MED ORDER — OXYCODONE HCL 5 MG/5ML PO SOLN: 5.0000 mg | Freq: Once | ORAL | Status: DC | PRN

## 2013-01-05 MED ORDER — OXYMETAZOLINE HCL 0.05 % NA SOLN
NASAL | Status: AC
  Filled 2013-01-05: qty 15

## 2013-01-05 MED ORDER — DEXAMETHASONE SODIUM PHOSPHATE 10 MG/ML IJ SOLN: 10.0000 mg | INTRAMUSCULAR | Status: DC

## 2013-01-05 SURGICAL SUPPLY — 30 items
BALLN PULM 15 16.5 18X75 (BALLOONS)
BALLOON PULM 15 16.5 18X75 (BALLOONS) IMPLANT
CANISTER SUCTION 2500CC (MISCELLANEOUS) ×2 IMPLANT
CLOTH BEACON ORANGE TIMEOUT ST (SAFETY) ×2 IMPLANT
CONT SPEC 4OZ CLIKSEAL STRL BL (MISCELLANEOUS) IMPLANT
COVER MAYO STAND STRL (DRAPES) ×2 IMPLANT
COVER TABLE BACK 60X90 (DRAPES) ×2 IMPLANT
CRADLE DONUT ADULT HEAD (MISCELLANEOUS) IMPLANT
DEPRESSOR TONGUE BLADE STERILE (MISCELLANEOUS) IMPLANT
DRAPE PROXIMA HALF (DRAPES) ×2 IMPLANT
GAUZE SPONGE 4X4 16PLY XRAY LF (GAUZE/BANDAGES/DRESSINGS) ×2 IMPLANT
GLOVE BIO SURGEON STRL SZ7.5 (GLOVE) ×2 IMPLANT
GOWN STRL NON-REIN LRG LVL3 (GOWN DISPOSABLE) IMPLANT
GUARD TEETH (MISCELLANEOUS) IMPLANT
KIT BASIN OR (CUSTOM PROCEDURE TRAY) ×2 IMPLANT
KIT ROOM TURNOVER OR (KITS) ×2 IMPLANT
MARKER SKIN DUAL TIP RULER LAB (MISCELLANEOUS) IMPLANT
NS IRRIG 1000ML POUR BTL (IV SOLUTION) ×2 IMPLANT
PAD ARMBOARD 7.5X6 YLW CONV (MISCELLANEOUS) ×4 IMPLANT
PAD EYE OVAL STERILE LF (GAUZE/BANDAGES/DRESSINGS) IMPLANT
PATTIES SURGICAL .5 X.5 (GAUZE/BANDAGES/DRESSINGS) ×2 IMPLANT
PATTIES SURGICAL .5 X3 (DISPOSABLE) IMPLANT
SOLUTION ANTI FOG 6CC (MISCELLANEOUS) ×2 IMPLANT
SPONGE GAUZE 4X4 12PLY (GAUZE/BANDAGES/DRESSINGS) IMPLANT
STAPLER TA60 3.5 010317 (STAPLE) ×2 IMPLANT
SUT SILK 2 0 SH (SUTURE) ×2 IMPLANT
SYR INFLATE BILIARY GAUGE (MISCELLANEOUS) IMPLANT
TOWEL OR 17X24 6PK STRL BLUE (TOWEL DISPOSABLE) ×4 IMPLANT
TUBE CONNECTING 12X1/4 (SUCTIONS) ×2 IMPLANT
WATER STERILE IRR 1000ML POUR (IV SOLUTION) ×2 IMPLANT

## 2013-01-05 NOTE — Preoperative (Signed)
Beta Blockers   Reason not to administer Beta Blockers:Not Applicable 

## 2013-01-05 NOTE — Op Note (Signed)
NAME:  DRAYK, HUMBARGER NO.:  [id number]  MEDICAL RECORD NO.:  [id number]  LOCATION:  [facility]                         FACILITY:  [facility]  PHYSICIAN:  Antony Contras, MD     DATE OF BIRTH:  [birth year]  DATE OF PROCEDURE:  01/05/2013 DATE OF DISCHARGE:                              OPERATIVE REPORT   PREOPERATIVE DIAGNOSES: 1. Hoarseness. 2. Right vocal fold lesion.  POSTOPERATIVE DIAGNOSES: 1. Hoarseness. 2. Right vocal fold lesion.  PROCEDURE:  Suspended microdirect laryngoscopy with CO2 laser excision of right vocal fold lesion.  SURGEON:  Antony Contras, MD  ANESTHESIA:  General endotracheal anesthesia.  COMPLICATIONS:  None.  INDICATION:  The patient is a 72 year old white male who has had a gradually worsening voice over the past year.  He was found to have a lesion on the right vocal fold that has been followed and shown not a great deal of change except a little bit more irregular on the last check.  Thus, he presents to the operating room for excisional biopsy.  FINDINGS:  The lesion was involving the mid cord on the right side on the vibratory surface and involve the mucosa and felt a bit firm.  He did not appear to invade into the vocalis muscle or ligament.  The lesion was excised using the CO2 laser and sent for pathology.  Directly across the lesion, on the left vocal fold, there was a patch of thickened mucosa that appears to be a reactive to the vibratory forces against the lesion on the right vocal fold.  This was not biopsied.  DESCRIPTION OF PROCEDURE:  The patient was identified in the holding room and informed consent having been obtained including discussion of risks, benefits, alternatives, the patient was brought to the operative suite, put on the operative table in supine position.  Anesthesia was induced and the patient was intubated by anesthesia team without difficulty using a small laser safe tube.  The eyes were taped closed and  bed was turned 90 degrees from anesthesia.  The patient was given intravenous steroids during the case.  Damp eye pads were taped over the eyes and a damp gauze was placed over the upper gum.  A Storz laryngoscope was then placed into a glottic position and suspended on the Mayo stand using a Lewy arm.  A 0-degree telescope was used to make a photograph of the glottis.  Epinephrine pledget was held against the right vocal fold while the operating microscope was brought into the field and CO2 laser attached to the microscope.  The pledget was removed and the lesion was then grasped with a micro cup forceps while an incision was made lateral to the lesion using the CO2 laser on a setting of 4 watts.  This was continued under the depth of the lesion and the excision was completed with a straight scissors and removing the lesion in 2 pieces.  This was passed to nursing for pathology.  An epinephrine pledget was held against the site.  A 0-degree telescope was used to make a postoperative photograph.  The larynx was suctioned and laryngoscope was taken out of suspension and removed from the patient's mouth while suctioning  the airway.  Of note, a damp towel was placed over the face, covering the face prior to using the CO2 laser.  Also, the larynx was sprayed with topical lidocaine prior to manipulating the vocal folds.  The patient was turned back to anesthesia for wake up, was extubated, moved to recovery room in stable condition.     Antony Contras, MD     DDB/MEDQ  D:  01/05/2013  T:  01/05/2013  Job:  [id number]

## 2013-01-05 NOTE — Progress Notes (Signed)
No bleeding from mouth noted on arrival to PACU. Nods "yes" when asked if his throat is sore. Dr. Jenne Pane at bedside to instruct patient not to speak, to let vocal cord rest. Patient given clipboard with paper and pencil to communicate if needed. Patient shakes his head "no" when asked if he felt like he needed pain med.

## 2013-01-05 NOTE — Anesthesia Preprocedure Evaluation (Addendum)
Anesthesia Evaluation  Patient identified by MRN, date of birth, ID band Patient awake    Reviewed: Allergy & Precautions, H&P , NPO status , Patient's Chart, lab work & pertinent test results  History of Anesthesia Complications Negative for: history of anesthetic complications  Airway Mallampati: I TM Distance: >3 FB Neck ROM: Full    Dental  (+) Edentulous Upper and Edentulous Lower   Pulmonary shortness of breath and with exertion,    Pulmonary exam normal       Cardiovascular hypertension, Pt. on medications - angina+ Past MI Rhythm:Regular Rate:Normal  Hx AVR 11/2012   Neuro/Psych    GI/Hepatic hiatal hernia, GERD-  Medicated and Controlled,  Endo/Other  diabetes, Well Controlled, Type 2, Insulin Dependent and Oral Hypoglycemic Agents  Renal/GU      Musculoskeletal   Abdominal   Peds  Hematology   Anesthesia Other Findings   Reproductive/Obstetrics                           Anesthesia Physical Anesthesia Plan  ASA: III  Anesthesia Plan: General   Post-op Pain Management:    Induction: Intravenous  Airway Management Planned: Oral ETT  Additional Equipment:   Intra-op Plan:   Post-operative Plan: Extubation in OR  Informed Consent: I have reviewed the patients History and Physical, chart, labs and discussed the procedure including the risks, benefits and alternatives for the proposed anesthesia with the patient or authorized representative who has indicated his/her understanding and acceptance.     Plan Discussed with: CRNA and Surgeon  Anesthesia Plan Comments:         Anesthesia Quick Evaluation

## 2013-01-05 NOTE — Brief Op Note (Signed)
01/05/2013  12:02 PM  PATIENT:  Casey Gilmore  72 y.o. male  PRE-OPERATIVE DIAGNOSIS:  Right vocal fold lesion, dysphonia  POST-OPERATIVE DIAGNOSIS:  Right vocal fold lesion, dysphonia  PROCEDURE:  Suspended microdirect laryngoscopy with CO2 laser excision of right vocal fold lesion  SURGEON:  Surgeon(s) and Role:    * Christia Reading, MD - Primary  PHYSICIAN ASSISTANT:   ASSISTANTS: none   ANESTHESIA:   general  EBL:     BLOOD ADMINISTERED:none  DRAINS: none   LOCAL MEDICATIONS USED:  NONE  SPECIMEN:  Source of Specimen:  Right vocal fold lesion  DISPOSITION OF SPECIMEN:  PATHOLOGY  COUNTS:  YES  TOURNIQUET:  * No tourniquets in log *  DICTATION: .Other Dictation: Dictation Number [id number]  PLAN OF CARE: Discharge to home after PACU  PATIENT DISPOSITION:  PACU - hemodynamically stable.   Delay start of Pharmacological VTE agent (>24hrs) due to surgical blood loss or risk of bleeding: yes

## 2013-01-05 NOTE — Anesthesia Postprocedure Evaluation (Signed)
Anesthesia Post Note  Patient: Casey Gilmore  Procedure(s) Performed: Procedure(s) (LRB): LARYNGOSCOPY (N/A)  Anesthesia type: general  Patient location: PACU  Post pain: Pain level controlled  Post assessment: Patient's Cardiovascular Status Stable  Last Vitals:  Filed Vitals:   01/05/13 1359  BP: 125/63  Pulse: 76  Temp: 37 C  Resp: 16    Post vital signs: Reviewed and stable  Level of consciousness: sedated  Complications: No apparent anesthesia complications

## 2013-01-05 NOTE — Transfer of Care (Signed)
Immediate Anesthesia Transfer of Care Note  Patient: Casey Gilmore  Procedure(s) Performed: Procedure(s) (LRB) with comments: LARYNGOSCOPY (N/A) - micro direct laryngoscopy with biopsy, possible co2 laser, small laser-safe endotracheal tube  Patient Location: PACU  Anesthesia Type:General  Level of Consciousness: awake, alert , oriented and patient cooperative  Airway & Oxygen Therapy: Patient Spontanous Breathing and Patient connected to nasal cannula oxygen  Post-op Assessment: Report given to PACU RN, Post -op Vital signs reviewed and stable and Patient moving all extremities  Post vital signs: Reviewed and stable  Complications: No apparent anesthesia complications

## 2013-01-05 NOTE — Anesthesia Procedure Notes (Signed)
Procedure Name: Intubation Date/Time: 01/05/2013 11:31 AM Performed by: Angelica Pou Pre-anesthesia Checklist: Patient identified, Timeout performed, Emergency Drugs available, Suction available and Patient being monitored Patient Re-evaluated:Patient Re-evaluated prior to inductionOxygen Delivery Method: Circle system utilized Preoxygenation: Pre-oxygenation with 100% oxygen Intubation Type: IV induction Ventilation: Mask ventilation without difficulty and Oral airway inserted - appropriate to patient size Laryngoscope Size: Mac and 4 Grade View: Grade I Tube type: Oral Laser Tube: Laser Tube and Cuffed inflated with minimal occlusive pressure - saline Tube size: 6.0 mm Number of attempts: 1 Airway Equipment and Method: Oral airway Placement Confirmation: ETT inserted through vocal cords under direct vision,  breath sounds checked- equal and bilateral and positive ETCO2 Tube secured with: Tape Dental Injury: Teeth and Oropharynx as per pre-operative assessment

## 2013-01-05 NOTE — H&P (Signed)
Casey Gilmore is an 72 y.o. male.   Chief Complaint: Hoarseness HPI: 72 year old male with gradually worsening hoarseness for the past year.  He was found to have a lesion on the right vocal fold that has not changed much over the past few months but does now look a bit more irregular.  Thus, he presents to the OR for excisional biopsy.  Past Medical History  Diagnosis Date  . Diabetes mellitus   . Hypertension   . Heart murmur   . Blood transfusion   . Arthritis   . Anemia   . Pneumonia hx 8/12  . Hiatal hernia   . Myocardial infarction 2012  . Upper GI bleeding   . Anginal pain     a"last time a week ago"  . Shortness of breath     with exertion  . GERD (gastroesophageal reflux disease)     Past Surgical History  Procedure Date  . Tonsillectomy   . Cardiac catheterization results on chart  . Eye surgery bil cat 08  . Fracture surgery pin in lft hip  . Joint replacement left knee 08, rt hip 08  . Aortic valve replacement 10/15/2011    Procedure: AORTIC VALVE REPLACEMENT (AVR);  Surgeon: Kathlee Nations Suann Larry, MD;  Location: [facility] OR;  Service: Open Heart Surgery;  Laterality: N/A;    History reviewed. No pertinent family history. Social History:  reports that he quit smoking about 34 years ago. His smoking use included Cigarettes. He has a 50 pack-year smoking history. He has never used smokeless tobacco. He reports that he does not drink alcohol or use illicit drugs.  Allergies: No Known Allergies  Medications Prior to Admission  Medication Sig Dispense Refill  . aspirin 81 MG tablet Take 81 mg by mouth daily.        . cyanocobalamin 1000 MCG tablet Take 1,000 mcg by mouth daily.        Marland Kitchen ezetimibe-simvastatin (VYTORIN) 10-40 MG per tablet Take 1 tablet by mouth at bedtime.        . ferrous sulfate 325 (65 FE) MG tablet Take 325 mg by mouth daily with breakfast.        . furosemide (LASIX) 40 MG tablet Take 40 mg by mouth daily.        Marland Kitchen glimepiride (AMARYL) 4 MG tablet  Take 4 mg by mouth daily before breakfast.        . HYDROcodone-acetaminophen (NORCO/VICODIN) 5-325 MG per tablet Take 1 tablet by mouth every 6 (six) hours as needed. For pain      . insulin glargine (LANTUS) 100 UNIT/ML injection Inject 50 Units into the skin at bedtime.      . Multiple Vitamins-Minerals (MULTIVITAMIN PO) Take 1 tablet by mouth daily.      Marland Kitchen omeprazole (PRILOSEC) 40 MG capsule Take 40 mg by mouth daily.      . potassium chloride SA (K-DUR,KLOR-CON) 20 MEQ tablet Take 20 mEq by mouth daily.          Results for orders placed during the hospital encounter of 01/05/13 (from the past 48 hour(s))  GLUCOSE, CAPILLARY     Status: Normal   Collection Time   01/05/13  9:00 AM      Component Value Range Comment   Glucose-Capillary 97  70 - 99 mg/dL    No results found.  Review of Systems  Musculoskeletal: Positive for back pain.  All other systems reviewed and are negative.    Blood  pressure 137/79, pulse 74, temperature 97.9 F (36.6 C), temperature source Oral, resp. rate 16, SpO2 95.00%. Physical Exam  Constitutional: He is oriented to person, place, and time. He appears well-developed and well-nourished. No distress.  HENT:  Head: Normocephalic and atraumatic.  Right Ear: External ear normal.  Left Ear: External ear normal.  Nose: Nose normal.  Mouth/Throat: Oropharynx is clear and moist.       Moderately hoarse voice.  Eyes: Conjunctivae normal and EOM are normal. Pupils are equal, round, and reactive to light.  Neck: Normal range of motion. Neck supple.  Cardiovascular: Normal rate.   Respiratory: Effort normal.  GI:       Did not examine.  Genitourinary:       Did not examine.  Musculoskeletal: Normal range of motion.  Neurological: He is alert and oriented to person, place, and time. No cranial nerve deficit.  Skin: Skin is warm and dry.  Psychiatric: He has a normal mood and affect. His behavior is normal. Judgment and thought content normal.      Assessment/Plan Right vocal fold lesion, hoarseness To OR for SMDL with CO2 laser excisional biopsy of right vocal fold lesion  Haily Caley 01/05/2013, 10:50 AM

## 2013-01-08 ENCOUNTER — Encounter (HOSPITAL_COMMUNITY): Payer: Self-pay | Admitting: Otolaryngology

## 2013-01-26 ENCOUNTER — Encounter (HOSPITAL_COMMUNITY): Payer: Self-pay | Admitting: Pharmacist

## 2013-02-02 ENCOUNTER — Other Ambulatory Visit (HOSPITAL_COMMUNITY): Payer: Self-pay | Admitting: Otolaryngology

## 2013-02-02 ENCOUNTER — Encounter (HOSPITAL_COMMUNITY): Payer: Self-pay

## 2013-02-02 ENCOUNTER — Encounter (HOSPITAL_COMMUNITY)
Admission: RE | Admit: 2013-02-02 | Discharge: 2013-02-02 | Disposition: A | Discharge status: Home / Self Care | Payer: Medicare Other | Source: Ambulatory Visit | Attending: Otolaryngology | Admitting: Otolaryngology

## 2013-02-02 HISTORY — DX: Malignant (primary) neoplasm, unspecified: C80.1

## 2013-02-02 LAB — CBC
HCT: 33.5 % — ABNORMAL LOW (ref 39.0–52.0)
MCHC: 31 g/dL (ref 30.0–36.0)
MCV: 79.2 fL (ref 78.0–100.0)
RDW: 15.8 % — ABNORMAL HIGH (ref 11.5–15.5)
WBC: 9.6 10*3/uL (ref 4.0–10.5)

## 2013-02-02 LAB — BASIC METABOLIC PANEL
BUN: 19 mg/dL (ref 6–23)
Chloride: 105 mEq/L (ref 96–112)
Creatinine, Ser: 0.98 mg/dL (ref 0.50–1.35)
GFR calc Af Amer: >90 mL/min (ref >90)

## 2013-02-02 NOTE — Pre-Procedure Instructions (Signed)
Casey Gilmore  02/02/2013   Your procedure is scheduled on: Thursday 02/08/13   Report to [facility] at 700 AM.  Call this number if you have problems the morning of surgery: [phone]   Remember:   Do not eat food or drink liquids after midnight.   Take these medicines the morning of surgery with A SIP OF WATER: HYDROCODONE, PRILOSEC, POTASSIUM    Do not wear jewelry, make-up or nail polish.  Do not wear lotions, powders, or perfumes. You may wear deodorant.  Do not shave 48 hours prior to surgery. Men may shave face and neck.  Do not bring valuables to the hospital.  Contacts, dentures or bridgework may not be worn into surgery.  Leave suitcase in the car. After surgery it may be brought to your room.  For patients admitted to the hospital, checkout time is 11:00 AM the day of  discharge.   Patients discharged the day of surgery will not be allowed to drive  home.  Name and phone number of your driver:   Special Instructions: Shower using CHG 2 nights before surgery and the night before surgery.  If you shower the day of surgery use CHG.  Use special wash - you have one bottle of CHG for all showers.  You should use approximately 1/3 of the bottle for each shower.   Please read over the following fact sheets that you were given: Pain Booklet, Coughing and Deep Breathing, MRSA Information and Surgical Site Infection Prevention

## 2013-02-06 NOTE — Addendum Note (Signed)
Addended by: Christia Reading D on: 02/06/2013 02:24 PM   Modules accepted: Orders

## 2013-02-08 ENCOUNTER — Encounter (HOSPITAL_COMMUNITY): Payer: Self-pay | Admitting: Certified Registered"

## 2013-02-08 ENCOUNTER — Ambulatory Visit (HOSPITAL_COMMUNITY)
Admission: RE | Admit: 2013-02-08 | Discharge: 2013-02-08 | Disposition: A | Discharge status: Home / Self Care | Payer: Medicare Other | Source: Ambulatory Visit | Attending: Otolaryngology | Admitting: Otolaryngology

## 2013-02-08 ENCOUNTER — Ambulatory Visit (HOSPITAL_COMMUNITY): Payer: Medicare Other | Admitting: Certified Registered"

## 2013-02-08 ENCOUNTER — Encounter (HOSPITAL_COMMUNITY): Payer: Self-pay | Admitting: *Deleted

## 2013-02-08 ENCOUNTER — Encounter (HOSPITAL_COMMUNITY)
Admission: RE | Disposition: A | Discharge status: Home / Self Care | Payer: Self-pay | Source: Ambulatory Visit | Attending: Otolaryngology

## 2013-02-08 DIAGNOSIS — C320 Malignant neoplasm of glottis: Secondary | ICD-10-CM

## 2013-02-08 DIAGNOSIS — E119 Type 2 diabetes mellitus without complications: Secondary | ICD-10-CM | POA: Insufficient documentation

## 2013-02-08 DIAGNOSIS — K219 Gastro-esophageal reflux disease without esophagitis: Secondary | ICD-10-CM | POA: Insufficient documentation

## 2013-02-08 DIAGNOSIS — I10 Essential (primary) hypertension: Secondary | ICD-10-CM | POA: Insufficient documentation

## 2013-02-08 DIAGNOSIS — Z794 Long term (current) use of insulin: Secondary | ICD-10-CM | POA: Insufficient documentation

## 2013-02-08 HISTORY — PX: LARYNGOSCOPY: SHX5203

## 2013-02-08 LAB — GLUCOSE, CAPILLARY: Glucose-Capillary: 108 mg/dL — ABNORMAL HIGH (ref 70–99)

## 2013-02-08 SURGERY — LARYNGOSCOPY
Anesthesia: General | Site: Throat | Wound class: Clean Contaminated

## 2013-02-08 MED ORDER — PROMETHAZINE HCL 25 MG/ML IJ SOLN: 6.2500 mg | INTRAMUSCULAR | Status: DC | PRN

## 2013-02-08 MED ORDER — OXYCODONE HCL 5 MG PO TABS: 5.0000 mg | ORAL_TABLET | Freq: Once | ORAL | Status: DC | PRN

## 2013-02-08 MED ORDER — FENTANYL CITRATE 0.05 MG/ML IJ SOLN
INTRAMUSCULAR | Status: DC | PRN
  Administered 2013-02-08: 100 ug via INTRAVENOUS

## 2013-02-08 MED ORDER — EPINEPHRINE HCL (NASAL) 0.1 % NA SOLN
NASAL | Status: DC | PRN
  Administered 2013-02-08: 30 mL via TOPICAL

## 2013-02-08 MED ORDER — HYDROMORPHONE HCL PF 1 MG/ML IJ SOLN
0.2500 mg | INTRAMUSCULAR | Status: DC | PRN
  Administered 2013-02-08 (×2): 0.5 mg via INTRAVENOUS

## 2013-02-08 MED ORDER — EPINEPHRINE HCL (NASAL) 0.1 % NA SOLN
NASAL | Status: AC
  Filled 2013-02-08: qty 30

## 2013-02-08 MED ORDER — ARTIFICIAL TEARS OP OINT
TOPICAL_OINTMENT | OPHTHALMIC | Status: DC | PRN
  Administered 2013-02-08: 1 via OPHTHALMIC

## 2013-02-08 MED ORDER — PROPOFOL 10 MG/ML IV BOLUS
INTRAVENOUS | Status: DC | PRN
  Administered 2013-02-08: 200 mg via INTRAVENOUS

## 2013-02-08 MED ORDER — 0.9 % SODIUM CHLORIDE (POUR BTL) OPTIME
TOPICAL | Status: DC | PRN
  Administered 2013-02-08: 1000 mL

## 2013-02-08 MED ORDER — OXYCODONE HCL 5 MG/5ML PO SOLN: 5.0000 mg | Freq: Once | ORAL | Status: DC | PRN

## 2013-02-08 MED ORDER — OXYMETAZOLINE HCL 0.05 % NA SOLN
NASAL | Status: AC
  Filled 2013-02-08: qty 15

## 2013-02-08 MED ORDER — LIDOCAINE HCL (CARDIAC) 20 MG/ML IV SOLN
INTRAVENOUS | Status: DC | PRN
  Administered 2013-02-08: 100 mg via INTRAVENOUS

## 2013-02-08 MED ORDER — HYDROMORPHONE HCL PF 1 MG/ML IJ SOLN
INTRAMUSCULAR | Status: AC
  Filled 2013-02-08: qty 1

## 2013-02-08 MED ORDER — ROCURONIUM BROMIDE 100 MG/10ML IV SOLN
INTRAVENOUS | Status: DC | PRN
  Administered 2013-02-08: 25 mg via INTRAVENOUS

## 2013-02-08 MED ORDER — NEOSTIGMINE METHYLSULFATE 1 MG/ML IJ SOLN
INTRAMUSCULAR | Status: DC | PRN
  Administered 2013-02-08: 4 mg via INTRAVENOUS

## 2013-02-08 MED ORDER — LACTATED RINGERS IV SOLN
INTRAVENOUS | Status: DC
  Administered 2013-02-08: 09:00:00 via INTRAVENOUS

## 2013-02-08 MED ORDER — GLYCOPYRROLATE 0.2 MG/ML IJ SOLN
INTRAMUSCULAR | Status: DC | PRN
  Administered 2013-02-08: .6 mg via INTRAVENOUS

## 2013-02-08 MED ORDER — ONDANSETRON HCL 4 MG/2ML IJ SOLN
INTRAMUSCULAR | Status: DC | PRN
  Administered 2013-02-08: 4 mg via INTRAVENOUS

## 2013-02-08 MED ORDER — LACTATED RINGERS IV SOLN
INTRAVENOUS | Status: DC | PRN
  Administered 2013-02-08: 08:00:00 via INTRAVENOUS

## 2013-02-08 SURGICAL SUPPLY — 21 items
CANISTER SUCTION 2500CC (MISCELLANEOUS) ×2 IMPLANT
CLOTH BEACON ORANGE TIMEOUT ST (SAFETY) ×2 IMPLANT
CONT SPEC 4OZ CLIKSEAL STRL BL (MISCELLANEOUS) ×2 IMPLANT
COVER MAYO STAND STRL (DRAPES) ×2 IMPLANT
COVER TABLE BACK 60X90 (DRAPES) ×2 IMPLANT
CRADLE DONUT ADULT HEAD (MISCELLANEOUS) ×2 IMPLANT
DEPRESSOR TONGUE BLADE STERILE (MISCELLANEOUS) ×2 IMPLANT
DRAPE PROXIMA HALF (DRAPES) ×2 IMPLANT
GAUZE SPONGE 4X4 16PLY XRAY LF (GAUZE/BANDAGES/DRESSINGS) ×2 IMPLANT
GLOVE BIO SURGEON STRL SZ7.5 (GLOVE) ×2 IMPLANT
KIT BASIN OR (CUSTOM PROCEDURE TRAY) ×2 IMPLANT
KIT ROOM TURNOVER OR (KITS) ×2 IMPLANT
NS IRRIG 1000ML POUR BTL (IV SOLUTION) ×2 IMPLANT
PAD ARMBOARD 7.5X6 YLW CONV (MISCELLANEOUS) ×2 IMPLANT
PAD EYE OVAL STERILE LF (GAUZE/BANDAGES/DRESSINGS) ×4 IMPLANT
PATTIES SURGICAL .5 X.5 (GAUZE/BANDAGES/DRESSINGS) ×2 IMPLANT
SOLUTION ANTI FOG 6CC (MISCELLANEOUS) ×2 IMPLANT
SUT SILK 2 0 SH (SUTURE) ×2 IMPLANT
TOWEL OR 17X24 6PK STRL BLUE (TOWEL DISPOSABLE) ×4 IMPLANT
TUBE CONNECTING 12X1/4 (SUCTIONS) ×2 IMPLANT
WATER STERILE IRR 1000ML POUR (IV SOLUTION) ×2 IMPLANT

## 2013-02-08 NOTE — Anesthesia Postprocedure Evaluation (Signed)
Anesthesia Post Note  Patient: Casey Gilmore  Procedure(s) Performed: Procedure(s) (LRB): LARYNGOSCOPY (N/A)  Anesthesia type: general  Patient location: PACU  Post pain: Pain level controlled  Post assessment: Patient's Cardiovascular Status Stable  Last Vitals:  Filed Vitals:   02/08/13 1045  BP: 109/59  Pulse: 74  Temp:   Resp: 14    Post vital signs: Reviewed and stable  Level of consciousness: sedated  Complications: No apparent anesthesia complications

## 2013-02-08 NOTE — Anesthesia Preprocedure Evaluation (Addendum)
Anesthesia Evaluation  Patient identified by MRN, date of birth, ID band Patient awake    Reviewed: Allergy & Precautions, H&P , NPO status , Patient's Chart, lab work & pertinent test results  History of Anesthesia Complications Negative for: history of anesthetic complications  Airway Mallampati: I TM Distance: >3 FB Neck ROM: Full    Dental  (+) Edentulous Upper and Edentulous Lower   Pulmonary shortness of breath and with exertion, former smoker,    Pulmonary exam normal       Cardiovascular hypertension, + Past MI  EF 45%   Neuro/Psych negative neurological ROS     GI/Hepatic Neg liver ROS, hiatal hernia, GERD-  ,  Endo/Other  diabetes, Type 1, Insulin Dependent  Renal/GU negative Renal ROS     Musculoskeletal   Abdominal   Peds  Hematology negative hematology ROS (+)   Anesthesia Other Findings   Reproductive/Obstetrics                        Anesthesia Physical Anesthesia Plan  ASA: III  Anesthesia Plan: General   Post-op Pain Management:    Induction: Intravenous  Airway Management Planned: Oral ETT  Additional Equipment:   Intra-op Plan:   Post-operative Plan: Extubation in OR  Informed Consent: I have reviewed the patients History and Physical, chart, labs and discussed the procedure including the risks, benefits and alternatives for the proposed anesthesia with the patient or authorized representative who has indicated his/her understanding and acceptance.   Dental advisory given  Plan Discussed with: CRNA, Anesthesiologist and Surgeon  Anesthesia Plan Comments:         Anesthesia Quick Evaluation

## 2013-02-08 NOTE — Transfer of Care (Signed)
Immediate Anesthesia Transfer of Care Note  Patient: Casey Gilmore  Procedure(s) Performed: Procedure(s) with comments: LARYNGOSCOPY (N/A) - Micro Direct Laryngoscopy with Co2 laser with re-excision right vocal cord lesion.  Patient Location: PACU  Anesthesia Type:General  Level of Consciousness: sedated and patient cooperative  Airway & Oxygen Therapy: Patient Spontanous Breathing  Post-op Assessment: Report given to PACU RN and Post -op Vital signs reviewed and stable  Post vital signs: Reviewed and stable  Complications: No apparent anesthesia complications

## 2013-02-08 NOTE — H&P (Signed)
Casey Gilmore is an 72 y.o. male.   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  He returns to the OR today for wider excision.  Past Medical History  Diagnosis Date  . Diabetes mellitus   . Hypertension   . Heart murmur   . Blood transfusion   . Arthritis   . Pneumonia hx 8/12  . Hiatal hernia   . Upper GI bleeding   . Shortness of breath     with exertion  . GERD (gastroesophageal reflux disease)   . Myocardial infarction 2012    [name]   . Cancer     VOCAL CORD  . Anemia     2012    Past Surgical History  Procedure Laterality Date  . Tonsillectomy    . Cardiac catheterization  results on chart  . Eye surgery  bil cat 08  . Fracture surgery  pin in lft hip  . Joint replacement  left knee 08, rt hip 08  . Aortic valve replacement  10/15/2011    Procedure: AORTIC VALVE REPLACEMENT (AVR);  Surgeon: Kathlee Nations Suann Larry, MD;  Location: [facility] OR;  Service: Open Heart Surgery;  Laterality: N/A;  . Laryngoscopy  01/05/2013    Procedure: LARYNGOSCOPY;  Surgeon: Christia Reading, MD;  Location: [facility] OR;  Service: ENT;  Laterality: N/A;  micro direct laryngoscopy with biopsy, possible co2 laser, small laser-safe endotracheal tube    History reviewed. No pertinent family history. Social History:  reports that he quit smoking about 34 years ago. His smoking use included Cigarettes. He has a 50 pack-year smoking history. He has never used smokeless tobacco. He reports that he does not drink alcohol or use illicit drugs.  Allergies: No Known Allergies  Medications Prior to Admission  Medication Sig Dispense Refill  . aspirin EC 81 MG tablet Take 81 mg by mouth daily.      . Cholecalciferol (VITAMIN D) 2000 UNITS tablet Take 2,000 Units by mouth daily.      . cyanocobalamin 2000 MCG tablet Take 2,000 mcg by mouth daily.      Marland Kitchen ezetimibe-simvastatin (VYTORIN)  10-40 MG per tablet Take 1 tablet by mouth at bedtime.       . ferrous sulfate 325 (65 FE) MG tablet Take 325 mg by mouth daily with breakfast.       . furosemide (LASIX) 40 MG tablet Take 40 mg by mouth daily.       Marland Kitchen glimepiride (AMARYL) 4 MG tablet Take 4 mg by mouth daily before breakfast.       . HYDROcodone-acetaminophen (NORCO/VICODIN) 5-325 MG per tablet Take 1 tablet by mouth every 6 (six) hours as needed for pain. For pain      . insulin glargine (LANTUS SOLOSTAR) 100 UNIT/ML injection Inject 50 Units into the skin at bedtime.      . metFORMIN (GLUCOPHAGE) 1000 MG tablet Take 1,000 mg by mouth 2 (two) times daily with a meal.      . Multiple Vitamins-Minerals (MULTIVITAMIN PO) Take 1 tablet by mouth daily.      Marland Kitchen omeprazole (PRILOSEC) 40 MG capsule Take 40 mg by mouth daily.      . potassium chloride SA (K-DUR,KLOR-CON) 20 MEQ tablet Take 20 mEq by mouth daily.          Results for orders placed during the hospital encounter of 02/08/13 (from the past 48 hour(s))  GLUCOSE, CAPILLARY     Status: Abnormal   Collection Time    02/08/13  7:16 AM      Result Value Range   Glucose-Capillary 108 (*) 70 - 99 mg/dL   No results found.  Review of Systems  All other systems reviewed and are negative.    Blood pressure 130/80, pulse 73, temperature 98 F (36.7 C), temperature source Oral, resp. rate 20, SpO2 95.00%. Physical Exam  Constitutional: He is oriented to person, place, and time. He appears well-developed and well-nourished. No distress.  HENT:  Head: Normocephalic and atraumatic.  Right Ear: External ear normal.  Left Ear: External ear normal.  Nose: Nose normal.  Mouth/Throat: Oropharynx is clear and moist.  Edentulous.  Moderate hoarseness.  Eyes: Conjunctivae and EOM are normal. Pupils are equal, round, and reactive to light.  Neck: Normal range of motion. Neck supple.  Cardiovascular: Normal rate.   Respiratory: Effort normal.  GI:  Did not examine.   Genitourinary:  Did not examine.  Musculoskeletal: Normal range of motion.  Neurological: He is alert and oriented to person, place, and time. No cranial nerve deficit.  Skin: Skin is warm and dry.  Psychiatric: He has a normal mood and affect. His behavior is normal. Judgment and thought content normal.     Assessment/Plan T1 glottic carcinoma, hoarseness To OR for SMDL with CO2 laser excision of right vocal fold carcinoma.  BATES, DWIGHT 02/08/2013, 8:56 AM

## 2013-02-08 NOTE — Anesthesia Procedure Notes (Signed)
Procedure Name: Intubation Date/Time: 02/08/2013 9:17 AM Performed by: Sherie Don Pre-anesthesia Checklist: Patient identified, Emergency Drugs available, Suction available, Patient being monitored and Timeout performed Patient Re-evaluated:Patient Re-evaluated prior to inductionOxygen Delivery Method: Circle system utilized Preoxygenation: Pre-oxygenation with 100% oxygen Intubation Type: IV induction Laryngoscope Size: Mac and 4 Grade View: Grade II Tube type: Oral Laser Tube: Cuffed inflated with minimal occlusive pressure - saline and Laser Tube Number of attempts: 1 Placement Confirmation: ETT inserted through vocal cords under direct vision,  positive ETCO2 and breath sounds checked- equal and bilateral Tube secured with: Tape Dental Injury: Teeth and Oropharynx as per pre-operative assessment

## 2013-02-08 NOTE — Preoperative (Signed)
Beta Blockers   Reason not to administer Beta Blockers:Not Applicable 

## 2013-02-08 NOTE — Brief Op Note (Signed)
02/08/2013  9:45 AM  PATIENT:  Casey Gilmore  72 y.o. male  PRE-OPERATIVE DIAGNOSIS:  T1 right vocal cord cancer  POST-OPERATIVE DIAGNOSIS:  T1 right vocal cord cancer  PROCEDURE:  Suspended microdirect laryngoscopy with CO2 laser re-excision of right vocal fold carcinoma  SURGEON:  Surgeon(s) and Role:    * Christia Reading, MD - Primary  PHYSICIAN ASSISTANT:   ASSISTANTS: none   ANESTHESIA:   general  EBL:  Total I/O In: 800 [I.V.:800] Out: -   BLOOD ADMINISTERED:none  DRAINS: none   LOCAL MEDICATIONS USED:  NONE  SPECIMEN:  Source of Specimen:  Right vocal fold lesion re-excision  DISPOSITION OF SPECIMEN:  PATHOLOGY  COUNTS:  YES  TOURNIQUET:  * No tourniquets in log *  DICTATION: .Other Dictation: Dictation Number [id number]  PLAN OF CARE: Discharge to home after PACU  PATIENT DISPOSITION:  PACU - hemodynamically stable.   Delay start of Pharmacological VTE agent (>24hrs) due to surgical blood loss or risk of bleeding: no

## 2013-02-09 NOTE — Op Note (Signed)
NAME:  SIMMIE, CAMERER NO.:  [id number]  MEDICAL RECORD NO.:  [id number]  LOCATION:  [facility]                         FACILITY:  [facility]  PHYSICIAN:  Antony Contras, MD     DATE OF BIRTH:  01/27/1941  DATE OF PROCEDURE:  02/08/2013 DATE OF DISCHARGE:  02/08/2013                              OPERATIVE REPORT   PREOPERATIVE DIAGNOSIS:  T1 right vocal fold carcinoma.  POSTOPERATIVE DIAGNOSIS:  T1 right vocal fold carcinoma.  PROCEDURE:  Suspended microdirect laryngoscopy with CO2 laser, re- excision of right vocal fold lesion.  SURGEON:  Antony Contras, MD  ANESTHESIA:  General endotracheal anesthesia.  COMPLICATIONS:  None.  INDICATION:  The patient is a 72 year old white male, who has a fairly long history of hoarseness and was found to have a lesion on the right vocal fold.  He underwent excisional biopsy on January 31st which revealed carcinoma but with unclear margins.  After discussing options, he has elected for re-excision and presents to the operating room for surgery today.  FINDINGS:  The right vocal fold is a bit granulated from previous surgery but there is no mass lesion today.  The area of previous surgery was widely excised and sent for Pathology.  DESCRIPTION OF PROCEDURE:  The patient was identified in the holding room. Informed consent having been obtained including discussion of risks, benefits, alternatives, the patient was brought to the operative suite, put on the operative table in supine position.  Anesthesia was induced.  The patient was intubated by anesthesia team without difficulty using a small laser safe tube.  The eyes were taped and closed and the bed was turned 90 degrees from anesthesia.  A damp gauze was placed over the upper gum and a Storz laryngoscope was then placed into the supraglottic position and suspended using a Lewy arm on a Mayo stand.  A 0-degree telescope was used to make a preoperative photograph and a  epinephrine pledget was then laid over the vocal cord on the right side.  The operating microscope was then brought into the field and the CO2 laser attached.  Damp eye pads were taped over the eyes and a damp towel was placed over the face.  With the CO2 laser on a setting of 4 watts, an incision was made then around the previous excision and extended in a plane into the vocalis muscle and gradually used to excise the area of previous excision.  Once the lesion was fully excised, this was passed to nursing for Pathology.  The site was then rubbed with a epinephrine pledget in order to remove char.  The airway was suctioned and a 0-degree telescope was again used to make a photograph.  The glottis was sprayed with topical lidocaine using an LTA and the laryngoscope was taken out of suspension and removed from the patient's mouth while suctioning the airway.  He was then turned back to Anesthesia for wake up, was extubated, moved to recovery room in stable condition.     Antony Contras, MD     DDB/MEDQ  D:  02/08/2013  T:  02/09/2013  Job:  [id number]

## 2013-02-12 ENCOUNTER — Encounter (HOSPITAL_COMMUNITY): Payer: Self-pay | Admitting: Otolaryngology

## 2014-06-24 ENCOUNTER — Encounter: Payer: Self-pay | Admitting: General Surgery

## 2014-06-24 DIAGNOSIS — I252 Old myocardial infarction: Secondary | ICD-10-CM | POA: Insufficient documentation

## 2014-06-24 DIAGNOSIS — I110 Hypertensive heart disease with heart failure: Secondary | ICD-10-CM

## 2014-06-24 DIAGNOSIS — I509 Heart failure, unspecified: Secondary | ICD-10-CM

## 2014-06-24 DIAGNOSIS — I5032 Chronic diastolic (congestive) heart failure: Secondary | ICD-10-CM

## 2014-06-24 DIAGNOSIS — Z952 Presence of prosthetic heart valve: Secondary | ICD-10-CM | POA: Insufficient documentation

## 2014-06-24 DIAGNOSIS — I10 Essential (primary) hypertension: Secondary | ICD-10-CM | POA: Insufficient documentation

## 2014-07-19 ENCOUNTER — Ambulatory Visit: Payer: Medicare Other | Admitting: Cardiology

## 2014-07-23 ENCOUNTER — Ambulatory Visit: Payer: Medicare Other | Admitting: Cardiology

## 2014-10-07 ENCOUNTER — Ambulatory Visit: Payer: Medicare Other | Admitting: Cardiology

## 2014-10-29 ENCOUNTER — Ambulatory Visit (INDEPENDENT_AMBULATORY_CARE_PROVIDER_SITE_OTHER): Payer: Medicare Other | Admitting: Cardiology

## 2014-10-29 ENCOUNTER — Encounter: Payer: Self-pay | Admitting: Cardiology

## 2014-10-29 VITALS — BP 130/80 | HR 69 | Ht 71.0 in | Wt 216.8 lb

## 2014-10-29 DIAGNOSIS — J449 Chronic obstructive pulmonary disease, unspecified: Secondary | ICD-10-CM

## 2014-10-29 DIAGNOSIS — I350 Nonrheumatic aortic (valve) stenosis: Secondary | ICD-10-CM

## 2014-10-29 DIAGNOSIS — I5032 Chronic diastolic (congestive) heart failure: Secondary | ICD-10-CM

## 2014-10-29 DIAGNOSIS — J4489 Other specified chronic obstructive pulmonary disease: Secondary | ICD-10-CM | POA: Insufficient documentation

## 2014-10-29 DIAGNOSIS — I10 Essential (primary) hypertension: Secondary | ICD-10-CM

## 2014-10-29 DIAGNOSIS — Z954 Presence of other heart-valve replacement: Secondary | ICD-10-CM

## 2014-10-29 DIAGNOSIS — E119 Type 2 diabetes mellitus without complications: Secondary | ICD-10-CM

## 2014-10-29 DIAGNOSIS — Z952 Presence of prosthetic heart valve: Secondary | ICD-10-CM

## 2014-10-29 LAB — BASIC METABOLIC PANEL
BUN: 18 mg/dL (ref 6–23)
CALCIUM: 9.3 mg/dL (ref 8.4–10.5)
CO2: 24 mEq/L (ref 19–32)
CREATININE: 0.9 mg/dL (ref 0.4–1.5)
Chloride: 103 mEq/L (ref 96–112)
GFR: 88.9 mL/min (ref >60.00)
Glucose, Bld: 172 mg/dL — ABNORMAL HIGH (ref 70–99)
Potassium: 4.5 mEq/L (ref 3.5–5.1)
Sodium: 140 mEq/L (ref 135–145)

## 2014-10-29 NOTE — Patient Instructions (Addendum)
Your physician recommends that you have lab work TODAY (BMET).  Your physician recommends that you continue on your current medications as directed. Please refer to the Current Medication list given to you today.  Your physician wants you to follow-up in: 1 year with Dr. Radford Pax. You will receive a reminder letter in the mail two months in advance. If you don't receive a letter, please call our office to schedule the follow-up appointment.

## 2014-10-29 NOTE — Progress Notes (Signed)
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 He is doing well.  He denies any chest pain, LE edema, dizziness, palpitations or syncope.  He has chronic SOB which is stable from his COPD.   Wt Readings from Last 3 Encounters:  10/29/14 216 lb 12.8 oz (98.34 kg)  02/02/13 225 lb 3.2 oz (102.15 kg)  12/29/12 229 lb 11.2 oz (104.191 kg)     Past Medical History  Diagnosis Date  . Diabetes mellitus   . Hypertension   . Heart murmur   . Blood transfusion   . Arthritis   . Pneumonia hx 8/12  . Hiatal hernia   . Upper GI bleeding   . Shortness of breath     with exertion  . GERD (gastroesophageal reflux disease)   . Myocardial infarction 2012    Casey Gilmore   . Cancer     VOCAL CORD  . Anemia     2012  . Hyperlipidemia   . COPD (chronic obstructive pulmonary disease)   . Severe aortic stenosis     s/p bioprosthetic AVR  . NSTEMI (non-ST elevated myocardial infarction) 07/2011    in setting of Pneumonia    Current Outpatient Prescriptions  Medication Sig Dispense Refill  . aspirin EC 81 MG tablet Take 81 mg by mouth daily.    . cyanocobalamin 2000 MCG tablet Take 10,000 mcg by mouth daily.     Marland Kitchen ezetimibe-simvastatin (VYTORIN) 10-40 MG per tablet Take 1 tablet by mouth at bedtime.     . furosemide (LASIX) 40 MG tablet Take 40 mg by mouth as needed.     Marland Kitchen glimepiride (AMARYL) 4 MG tablet Take 4 mg by mouth daily before breakfast.     . HYDROcodone-acetaminophen (NORCO/VICODIN) 5-325 MG per tablet Take 1 tablet by mouth every 6  (six) hours as needed for pain. For pain    . insulin glargine (LANTUS SOLOSTAR) 100 UNIT/ML injection Inject 50 Units into the skin at bedtime.    . metFORMIN (GLUCOPHAGE) 1000 MG tablet Take 1,000 mg by mouth 2 (two) times daily with a meal.    . omeprazole (PRILOSEC) 40 MG capsule Take 40 mg by mouth daily.    . potassium chloride SA (K-DUR,KLOR-CON) 20 MEQ tablet Take 20 mEq by mouth daily.       No current facility-administered medications for this visit.    Allergies:   No Known Allergies  Social History:  The patient  reports that he quit smoking about 35 years ago. His smoking use included Cigarettes. He has a 50 pack-year smoking history. He has never used smokeless tobacco. He reports that he does not drink alcohol or use illicit drugs.   Family History:  The patient's family history is not on file.   ROS:  Please see the history of present illness.      All other systems reviewed and negative.   PHYSICAL EXAM: VS:  BP 130/80 mmHg  Pulse 69  Ht 5\' 11"  (1.803 m)  Wt 216 lb 12.8 oz (98.34 kg)  BMI 30.25 kg/m2  SpO2 94% Well nourished, well developed, in no acute distress HEENT: normal Neck: no JVD Cardiac:  normal S1, S2; RRR; no murmur Lungs:  clear to auscultation bilaterally, no wheezing, rhonchi or rales Abd: soft, nontender, no hepatomegaly Ext: no edema Skin: warm and dry Neuro:  CNs 2-12 intact, no focal abnormalities noted  EKG:  NSR with RBBB     ASSESSMENT AND PLAN:  1. Severe AS s/p bioprosthetic AVR - continue ASA 2. Chronic diastolic CHF.  He appears euvolemic on exam.  Continue Lasix.  Check BMET 3. NSTEMI in the setting of PNA with no significant CAD on cath 4. COPD - per PCP 5. Type II DM - per PCP  Followup with me in 1 year  Signed, Casey Him, MD [facility] 10/29/2014 9:47 AM

## 2014-11-12 ENCOUNTER — Encounter: Payer: Self-pay | Admitting: Cardiology

## 2014-11-14 ENCOUNTER — Encounter: Payer: Self-pay | Admitting: Cardiology

## 2015-01-29 ENCOUNTER — Encounter: Payer: Self-pay | Admitting: Cardiology

## 2015-02-12 ENCOUNTER — Encounter: Payer: Medicare Other | Attending: Family Medicine | Admitting: *Deleted

## 2015-02-12 ENCOUNTER — Encounter: Payer: Self-pay | Admitting: *Deleted

## 2015-02-12 VITALS — Ht 71.0 in | Wt 219.6 lb

## 2015-02-12 DIAGNOSIS — Z713 Dietary counseling and surveillance: Secondary | ICD-10-CM | POA: Insufficient documentation

## 2015-02-12 DIAGNOSIS — E119 Type 2 diabetes mellitus without complications: Secondary | ICD-10-CM | POA: Insufficient documentation

## 2015-02-12 DIAGNOSIS — Z794 Long term (current) use of insulin: Secondary | ICD-10-CM | POA: Insufficient documentation

## 2015-02-12 NOTE — Progress Notes (Signed)
Diabetes Self-Management Education  Visit Type:  Initial DSME  Appt. Start Time: 0930 Appt. End Time: 1100  02/12/2015  [name], identified by name and date of birth, is a 74 y.o. male with a diagnosis of Diabetes: Type 2.  Other people present during visit:  Patient [name] has his 47yo Grandson living with her. His son lives next door. [name] does not cook and eats almost all meals at a restaurant/fast food establishment. There are some items he is not willing to give up; 6-8 oz of regular Pepsi a few times per week, piece of pie once per week. We have discussed ways to adjust her meals to accommodate for this items. He indicated that these would be workable solutions. Due to physical challenges he does not engage in activity.  ASSESSMENT  Height 5\' 11"  (1.803 m), weight 219 lb 9.6 oz (99.61 kg). Body mass index is 30.64 kg/(m^2).  Initial Visit Information:  Are you taking your medications as prescribed?: Yes Are you checking your feet?: Yes How often do you need to have someone help you when you read instructions, pamphlets, or other written materials from your doctor or pharmacy?: 1 - Never   Psychosocial:   Patient Belief/Attitude about Diabetes: Motivated to manage diabetes Self-care barriers: Other (comment), Unsteady gait/risk for falls (patient ldoes not cook. He eats almost all meals out.) Self-management support: Doctor's office, Family, CDE visits Other persons present: Patient Patient Concerns: Nutrition/Meal planning Special Needs: None Preferred Learning Style: No preference indicated Learning Readiness: Contemplating  Complications:   Last HgB A1C per patient/outside source: 8 mg/dL How often do you check your blood sugar?: 1-2 times/day Fasting Blood glucose range (mg/dL): 70-129 Postprandial Blood glucose range (mg/dL): 130-179 Have you had a dilated eye exam in the past 12 months?: No Have you had a dental exam in the past 12 months?: No  (dentures)  Diet Intake:  Breakfast: eggs, sausage, toast, coffee, sweetener, cream (country kitchen meet friends) Snack (morning): none Lunch: pack of nabs, regular pepsi / cheeseburger [employer], sweet tea, regular pepsi Snack (afternoon): crackers & cheese or peanutbutter occassionaly  Dinner: [place], dressing, potatoes, egg custard pie / sweet potatoes , steak,  corn or green beans add little sugar, sweet tea / [employer] garden salad, plain baked potatoe Beverage(s): sweet tea, regular soda  Exercise:  Exercise: ADL's  Individualized Plan for Diabetes Self-Management Training:   Learning Objective:  Patient will have a greater understanding of diabetes self-management. Patient education plan per assessed needs and concerns is to attend individual sessions     Education Topics Reviewed with Patient Today:  Factors that contribute to the development of diabetes, Explored patient's options for treatment of their diabetes Role of diet in the treatment of diabetes and the relationship between the three main macronutrients and blood glucose level, Reviewed blood glucose goals for pre and post meals and how to evaluate the patients' food intake on their blood glucose level., Information on hints to eating out and maintain blood glucose control., Meal options for control of blood glucose level and chronic complications. Role of exercise on diabetes management, blood pressure control and cardiac health. Relationship between chronic complications and blood glucose control  PATIENTS GOALS/Plan (Developed by the patient):  Nutrition: General guidelines for healthy choices and portions discussed Physical Activity: Not Applicable Medications: take my medication as prescribed Monitoring : test my blood glucose as discussed (note x per day with comment) Reducing Risk: do foot checks daily, treat hypoglycemia with 15 grams  of carbs if blood glucose less than 70mg /dL  Plan:   Patient  Instructions  Follow the plate method Use unsweet tea and add artificial sweetener Try to drink as little regular pepsi as possible Groceries: Hard boiled eggs Bread & peanut butter Consider going to [employer] and get smaller pack of [employer] with 4 per pack rather than 6 per pack  If you go to the cafeteria and want to have that piece of pie.... Have a meat or fish, a vegetable (green beans) and smaller piece of pie   Expected Outcomes:  Demonstrated interest in learning. Expect positive outcomes  Education material provided: Living Well with Diabetes, Meal plan card, My Plate and Snack sheet  If problems or questions, patient to contact team via:  Phone  Future DSME appointment: PRN

## 2015-02-12 NOTE — Patient Instructions (Addendum)
Follow the plate method Use unsweet tea and add artificial sweetener Try to drink as little regular pepsi as possible Groceries: Hard boiled eggs Bread & peanut butter Consider going to [employer] and get smaller pack of [employer] with 4 per pack rather than 6 per pack  If you go to the cafeteria and want to have that piece of pie.... Have a meat or fish, a vegetable (green beans) and smaller piece of pie

## 2015-03-05 NOTE — Progress Notes (Signed)
Cardiology Office Note   Date:  03/06/2015   ID:  Casey Gilmore, DOB Aug 31, 1941, MRN [id number]  PCP:  Mayra Neer, MD    Chief Complaint  Patient presents with  . Aortic Stenosis  . 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 He is doing well. He denies any chest pain, LE edema, dizziness, palpitations or syncope. He has chronic SOB which is stable from his COPD.    Past Medical History  Diagnosis Date  . Diabetes mellitus   . Hypertension   . Heart murmur   . Blood transfusion   . Arthritis   . Pneumonia hx 8/12  . Hiatal hernia   . Upper GI bleeding   . Shortness of breath     with exertion  . GERD (gastroesophageal reflux disease)   . Cancer     VOCAL CORD  . Anemia     2012  . Hyperlipidemia   . COPD (chronic obstructive pulmonary disease)   . Severe aortic stenosis 2012    s/p bioprosthetic AVR  . NSTEMI (non-ST elevated myocardial infarction) 07/2011    in setting of Pneumonia with normal coronary arteries on cath    Past Surgical History  Procedure Laterality Date  . Tonsillectomy    . Eye surgery  bil cat 08  . Fracture surgery  pin in lft hip  . Joint replacement  left knee 08, rt hip 08  . Aortic valve replacement  10/15/2011    Procedure: AORTIC VALVE REPLACEMENT (AVR);  Surgeon: Tharon Aquas Adelene Idler, MD;  Location: [facility];  Service: Open Heart Surgery;  Laterality: N/A;  . Laryngoscopy  01/05/2013    Procedure: LARYNGOSCOPY;  Surgeon: Melida Quitter, MD;  Location: [facility];  Service: ENT;  Laterality: N/A;  micro direct laryngoscopy with biopsy, possible co2 laser, small laser-safe endotracheal tube  . Laryngoscopy N/A 02/08/2013    Procedure: LARYNGOSCOPY;  Surgeon: Melida Quitter, MD;  Location: [facility];  Service: ENT;  Laterality: N/A;  Micro Direct Laryngoscopy with Co2 laser with re-excision right vocal cord lesion.  . Total hip arthroplasty Right   . Total knee arthroplasty Left   . Colonoscopy  12/2004  . 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 Cardiac catheterization  results on chart    2012 w no significant disease,echo with Severe Aortic stenosis,LVH,Diastolic dysfunction.      Current Outpatient Prescriptions  Medication Sig Dispense Refill  . aspirin EC 81 MG tablet Take 81 mg by mouth daily.    . cyanocobalamin 2000 MCG tablet Take 4,000 mcg by mouth daily.     . Dulaglutide 1.5 MG/0.5ML SOPN Inject 1.5 mg into the skin once a week.    . ezetimibe-simvastatin (VYTORIN) 10-40 MG per tablet Take 1 tablet by mouth at bedtime.     . furosemide (LASIX) 40 MG tablet Take 40 mg by mouth daily as needed (swelling).     Marland Kitchen glimepiride (AMARYL) 4 MG tablet Take 4 mg by mouth daily before breakfast.     . HYDROcodone-acetaminophen (NORCO/VICODIN) 5-325 MG per tablet Take 1 tablet by mouth every 6 (six) hours as needed for pain.     Marland Kitchen  insulin glargine (LANTUS SOLOSTAR) 100 UNIT/ML injection Inject 50 Units into the skin at bedtime.    . metFORMIN (GLUCOPHAGE) 1000 MG tablet Take 1,000 mg by mouth 2 (two) times daily with a meal.    . omeprazole (PRILOSEC) 40 MG capsule Take 40 mg by mouth daily.    . potassium chloride SA (K-DUR,KLOR-CON) 20 MEQ tablet Take 20 mEq by mouth daily.       No current facility-administered medications for this visit.    Allergies:   Review of patient's allergies indicates no known allergies.    Social History:  The patient  reports that he quit smoking about 36 years ago. His smoking use included Cigarettes. He has a 50 pack-year smoking history. He has never used smokeless tobacco. He reports that he does not drink alcohol or use illicit drugs.   Family History:  The patient's family history  is not on file.    ROS:  Please see the history of present illness.   Otherwise, review of systems are positive for none.   All other systems are reviewed and negative.    PHYSICAL EXAM: VS:  BP 130/88 mmHg  Pulse 74  Ht 5\' 11"  (1.803 m)  Wt 220 lb 3.2 oz (99.882 kg)  BMI 30.73 kg/m2  SpO2 93% , BMI Body mass index is 30.73 kg/(m^2). GEN: Well nourished, well developed, in no acute distress HEENT: normal Neck: no JVD, carotid bruits, or masses Cardiac: RRR; no murmurs, rubs, or gallops,no edema  Respiratory:  clear to auscultation bilaterally, normal work of breathing GI: soft, nontender, nondistended, + BS MS: no deformity or atrophy Skin: warm and dry, no rash Neuro:  Strength and sensation are intact Psych: euthymic mood, full affect   EKG:  EKG is not ordered today.    Recent Labs: 10/29/2014: BUN 18; Creatinine 0.9; Potassium 4.5; Sodium 140    Lipid Panel    Component Value Date/Time   CHOL 84 07/16/2011 0109   TRIG 113 07/16/2011 0109   HDL 25* 07/16/2011 0109   CHOLHDL 3.4 07/16/2011 0109   VLDL 23 07/16/2011 0109   LDLCALC 36 07/16/2011 0109      Wt Readings from Last 3 Encounters:  03/06/15 220 lb 3.2 oz (99.882 kg)  02/12/15 219 lb 9.6 oz (99.61 kg)  10/29/14 216 lb 12.8 oz (98.34 kg)    ASSESSMENT AND PLAN:  1. Severe AS s/p bioprosthetic AVR - continue ASA 2. Chronic diastolic CHF. He appears euvolemic on exam. Continue Lasix. Check BMET results from PCP 3. NSTEMI in the setting of PNA with no significant CAD on cath 4. COPD - per PCP 5. Type II DM - per PCP 6. HTN- controlled     Current medicines are reviewed at length with the patient today.  The patient does not have concerns regarding medicines.  The following changes have been made:  no change  Labs/ tests ordered today include: see above assessment and plan No orders of the defined types were placed in this encounter.     Disposition:   FU with me in 1  year   Signed, Sueanne Margarita, MD  03/06/2015 9:59 AM    [facility] [place], [place], [place]  [address] Phone: [phone]; Fax: [phone]

## 2015-03-06 ENCOUNTER — Encounter: Payer: Self-pay | Admitting: Cardiology

## 2015-03-06 ENCOUNTER — Ambulatory Visit (INDEPENDENT_AMBULATORY_CARE_PROVIDER_SITE_OTHER): Payer: Medicare Other | Admitting: Cardiology

## 2015-03-06 ENCOUNTER — Other Ambulatory Visit: Payer: Self-pay

## 2015-03-06 VITALS — BP 130/88 | HR 74 | Ht 71.0 in | Wt 220.2 lb

## 2015-03-06 DIAGNOSIS — I252 Old myocardial infarction: Secondary | ICD-10-CM | POA: Diagnosis not present

## 2015-03-06 DIAGNOSIS — I10 Essential (primary) hypertension: Secondary | ICD-10-CM

## 2015-03-06 DIAGNOSIS — I350 Nonrheumatic aortic (valve) stenosis: Secondary | ICD-10-CM | POA: Diagnosis not present

## 2015-03-06 DIAGNOSIS — I5032 Chronic diastolic (congestive) heart failure: Secondary | ICD-10-CM

## 2015-03-06 NOTE — Patient Instructions (Signed)
Your physician wants you to follow-up in: 1 year with Dr. Turner. You will receive a reminder letter in the mail two months in advance. If you don't receive a letter, please call our office to schedule the follow-up appointment.  

## 2015-03-12 ENCOUNTER — Encounter: Payer: Self-pay | Admitting: Cardiology

## 2015-08-20 ENCOUNTER — Encounter (INDEPENDENT_AMBULATORY_CARE_PROVIDER_SITE_OTHER): Payer: Medicare Other | Admitting: Ophthalmology

## 2015-08-20 DIAGNOSIS — H43813 Vitreous degeneration, bilateral: Secondary | ICD-10-CM | POA: Diagnosis not present

## 2015-08-20 DIAGNOSIS — H338 Other retinal detachments: Secondary | ICD-10-CM

## 2016-01-15 DIAGNOSIS — Z87891 Personal history of nicotine dependence: Secondary | ICD-10-CM | POA: Diagnosis not present

## 2016-01-15 DIAGNOSIS — I10 Essential (primary) hypertension: Secondary | ICD-10-CM | POA: Diagnosis not present

## 2016-01-15 DIAGNOSIS — E119 Type 2 diabetes mellitus without complications: Secondary | ICD-10-CM | POA: Diagnosis not present

## 2016-01-15 DIAGNOSIS — H35372 Puckering of macula, left eye: Secondary | ICD-10-CM | POA: Diagnosis not present

## 2016-01-15 DIAGNOSIS — J449 Chronic obstructive pulmonary disease, unspecified: Secondary | ICD-10-CM | POA: Diagnosis not present

## 2016-01-15 DIAGNOSIS — Z79899 Other long term (current) drug therapy: Secondary | ICD-10-CM | POA: Diagnosis not present

## 2016-01-15 DIAGNOSIS — I5032 Chronic diastolic (congestive) heart failure: Secondary | ICD-10-CM | POA: Diagnosis not present

## 2016-01-15 DIAGNOSIS — Z7984 Long term (current) use of oral hypoglycemic drugs: Secondary | ICD-10-CM | POA: Diagnosis not present

## 2016-01-15 DIAGNOSIS — Z961 Presence of intraocular lens: Secondary | ICD-10-CM | POA: Diagnosis not present

## 2016-02-10 DIAGNOSIS — Z961 Presence of intraocular lens: Secondary | ICD-10-CM | POA: Diagnosis not present

## 2016-02-10 DIAGNOSIS — H3322 Serous retinal detachment, left eye: Secondary | ICD-10-CM | POA: Diagnosis not present

## 2016-02-10 DIAGNOSIS — H578 Other specified disorders of eye and adnexa: Secondary | ICD-10-CM | POA: Diagnosis not present

## 2016-02-10 DIAGNOSIS — E119 Type 2 diabetes mellitus without complications: Secondary | ICD-10-CM | POA: Diagnosis not present

## 2016-02-10 DIAGNOSIS — H35372 Puckering of macula, left eye: Secondary | ICD-10-CM | POA: Diagnosis not present

## 2016-05-18 DIAGNOSIS — H35372 Puckering of macula, left eye: Secondary | ICD-10-CM | POA: Diagnosis not present

## 2016-05-18 DIAGNOSIS — Z961 Presence of intraocular lens: Secondary | ICD-10-CM | POA: Diagnosis not present

## 2016-05-18 DIAGNOSIS — H3322 Serous retinal detachment, left eye: Secondary | ICD-10-CM | POA: Diagnosis not present

## 2016-07-30 DIAGNOSIS — E1165 Type 2 diabetes mellitus with hyperglycemia: Secondary | ICD-10-CM | POA: Diagnosis not present

## 2016-07-30 DIAGNOSIS — E782 Mixed hyperlipidemia: Secondary | ICD-10-CM | POA: Diagnosis not present

## 2016-07-30 DIAGNOSIS — Q2733 Arteriovenous malformation of digestive system vessel: Secondary | ICD-10-CM | POA: Diagnosis not present

## 2016-07-30 DIAGNOSIS — E669 Obesity, unspecified: Secondary | ICD-10-CM | POA: Diagnosis not present

## 2016-07-30 DIAGNOSIS — I110 Hypertensive heart disease with heart failure: Secondary | ICD-10-CM | POA: Diagnosis not present

## 2016-07-30 DIAGNOSIS — J449 Chronic obstructive pulmonary disease, unspecified: Secondary | ICD-10-CM | POA: Diagnosis not present

## 2016-07-30 DIAGNOSIS — K219 Gastro-esophageal reflux disease without esophagitis: Secondary | ICD-10-CM | POA: Diagnosis not present

## 2016-07-30 DIAGNOSIS — C320 Malignant neoplasm of glottis: Secondary | ICD-10-CM | POA: Diagnosis not present

## 2016-07-30 DIAGNOSIS — Z952 Presence of prosthetic heart valve: Secondary | ICD-10-CM | POA: Diagnosis not present

## 2016-07-30 DIAGNOSIS — Z0000 Encounter for general adult medical examination without abnormal findings: Secondary | ICD-10-CM | POA: Diagnosis not present

## 2016-11-09 DIAGNOSIS — Z961 Presence of intraocular lens: Secondary | ICD-10-CM | POA: Diagnosis not present

## 2016-11-09 DIAGNOSIS — H3322 Serous retinal detachment, left eye: Secondary | ICD-10-CM | POA: Diagnosis not present

## 2016-11-09 DIAGNOSIS — H35372 Puckering of macula, left eye: Secondary | ICD-10-CM | POA: Diagnosis not present

## 2016-11-09 DIAGNOSIS — H04123 Dry eye syndrome of bilateral lacrimal glands: Secondary | ICD-10-CM | POA: Diagnosis not present

## 2016-11-09 DIAGNOSIS — H35342 Macular cyst, hole, or pseudohole, left eye: Secondary | ICD-10-CM | POA: Diagnosis not present

## 2016-12-14 DIAGNOSIS — Z961 Presence of intraocular lens: Secondary | ICD-10-CM | POA: Diagnosis not present

## 2016-12-14 DIAGNOSIS — H3322 Serous retinal detachment, left eye: Secondary | ICD-10-CM | POA: Diagnosis not present

## 2017-02-04 DIAGNOSIS — E782 Mixed hyperlipidemia: Secondary | ICD-10-CM | POA: Diagnosis not present

## 2017-02-04 DIAGNOSIS — K219 Gastro-esophageal reflux disease without esophagitis: Secondary | ICD-10-CM | POA: Diagnosis not present

## 2017-02-04 DIAGNOSIS — E119 Type 2 diabetes mellitus without complications: Secondary | ICD-10-CM | POA: Diagnosis not present

## 2017-02-04 DIAGNOSIS — I110 Hypertensive heart disease with heart failure: Secondary | ICD-10-CM | POA: Diagnosis not present

## 2017-04-11 DIAGNOSIS — E119 Type 2 diabetes mellitus without complications: Secondary | ICD-10-CM | POA: Diagnosis not present

## 2017-08-19 DIAGNOSIS — I5022 Chronic systolic (congestive) heart failure: Secondary | ICD-10-CM | POA: Diagnosis not present

## 2017-08-19 DIAGNOSIS — I110 Hypertensive heart disease with heart failure: Secondary | ICD-10-CM | POA: Diagnosis not present

## 2017-08-19 DIAGNOSIS — E1165 Type 2 diabetes mellitus with hyperglycemia: Secondary | ICD-10-CM | POA: Diagnosis not present

## 2017-08-19 DIAGNOSIS — Z0000 Encounter for general adult medical examination without abnormal findings: Secondary | ICD-10-CM | POA: Diagnosis not present

## 2017-08-29 DIAGNOSIS — E782 Mixed hyperlipidemia: Secondary | ICD-10-CM | POA: Diagnosis not present

## 2017-12-16 DIAGNOSIS — I110 Hypertensive heart disease with heart failure: Secondary | ICD-10-CM | POA: Diagnosis not present

## 2017-12-16 DIAGNOSIS — E1165 Type 2 diabetes mellitus with hyperglycemia: Secondary | ICD-10-CM | POA: Diagnosis not present

## 2017-12-16 DIAGNOSIS — J449 Chronic obstructive pulmonary disease, unspecified: Secondary | ICD-10-CM | POA: Diagnosis not present

## 2017-12-16 DIAGNOSIS — E782 Mixed hyperlipidemia: Secondary | ICD-10-CM | POA: Diagnosis not present

## 2018-01-14 ENCOUNTER — Other Ambulatory Visit: Payer: Self-pay

## 2018-01-14 ENCOUNTER — Emergency Department (HOSPITAL_COMMUNITY)
Admission: EM | Admit: 2018-01-14 | Discharge: 2018-01-15 | Disposition: A | Discharge status: Home / Self Care | Payer: Medicare Other | Attending: Emergency Medicine | Admitting: Emergency Medicine

## 2018-01-14 ENCOUNTER — Emergency Department (HOSPITAL_COMMUNITY): Payer: Medicare Other

## 2018-01-14 DIAGNOSIS — S62102A Fracture of unspecified carpal bone, left wrist, initial encounter for closed fracture: Secondary | ICD-10-CM

## 2018-01-14 DIAGNOSIS — S42202A Unspecified fracture of upper end of left humerus, initial encounter for closed fracture: Secondary | ICD-10-CM

## 2018-01-14 DIAGNOSIS — J449 Chronic obstructive pulmonary disease, unspecified: Secondary | ICD-10-CM | POA: Insufficient documentation

## 2018-01-14 DIAGNOSIS — W01198A Fall on same level from slipping, tripping and stumbling with subsequent striking against other object, initial encounter: Secondary | ICD-10-CM | POA: Insufficient documentation

## 2018-01-14 DIAGNOSIS — S79912A Unspecified injury of left hip, initial encounter: Secondary | ICD-10-CM | POA: Diagnosis not present

## 2018-01-14 DIAGNOSIS — Z96652 Presence of left artificial knee joint: Secondary | ICD-10-CM | POA: Insufficient documentation

## 2018-01-14 DIAGNOSIS — Z79899 Other long term (current) drug therapy: Secondary | ICD-10-CM | POA: Insufficient documentation

## 2018-01-14 DIAGNOSIS — Y999 Unspecified external cause status: Secondary | ICD-10-CM | POA: Insufficient documentation

## 2018-01-14 DIAGNOSIS — T148XXA Other injury of unspecified body region, initial encounter: Secondary | ICD-10-CM | POA: Diagnosis not present

## 2018-01-14 DIAGNOSIS — Y9301 Activity, walking, marching and hiking: Secondary | ICD-10-CM | POA: Insufficient documentation

## 2018-01-14 DIAGNOSIS — Z96641 Presence of right artificial hip joint: Secondary | ICD-10-CM | POA: Insufficient documentation

## 2018-01-14 DIAGNOSIS — S0990XA Unspecified injury of head, initial encounter: Secondary | ICD-10-CM | POA: Diagnosis not present

## 2018-01-14 DIAGNOSIS — I110 Hypertensive heart disease with heart failure: Secondary | ICD-10-CM | POA: Insufficient documentation

## 2018-01-14 DIAGNOSIS — S022XXA Fracture of nasal bones, initial encounter for closed fracture: Secondary | ICD-10-CM | POA: Diagnosis not present

## 2018-01-14 DIAGNOSIS — W19XXXA Unspecified fall, initial encounter: Secondary | ICD-10-CM

## 2018-01-14 DIAGNOSIS — S42201A Unspecified fracture of upper end of right humerus, initial encounter for closed fracture: Secondary | ICD-10-CM | POA: Diagnosis not present

## 2018-01-14 DIAGNOSIS — Y929 Unspecified place or not applicable: Secondary | ICD-10-CM | POA: Diagnosis not present

## 2018-01-14 DIAGNOSIS — S52502A Unspecified fracture of the lower end of left radius, initial encounter for closed fracture: Secondary | ICD-10-CM | POA: Insufficient documentation

## 2018-01-14 DIAGNOSIS — S0992XA Unspecified injury of nose, initial encounter: Secondary | ICD-10-CM | POA: Diagnosis present

## 2018-01-14 DIAGNOSIS — S199XXA Unspecified injury of neck, initial encounter: Secondary | ICD-10-CM | POA: Diagnosis not present

## 2018-01-14 DIAGNOSIS — Y92009 Unspecified place in unspecified non-institutional (private) residence as the place of occurrence of the external cause: Secondary | ICD-10-CM

## 2018-01-14 DIAGNOSIS — S52352A Displaced comminuted fracture of shaft of radius, left arm, initial encounter for closed fracture: Secondary | ICD-10-CM | POA: Diagnosis not present

## 2018-01-14 DIAGNOSIS — M25512 Pain in left shoulder: Secondary | ICD-10-CM | POA: Diagnosis not present

## 2018-01-14 DIAGNOSIS — I5032 Chronic diastolic (congestive) heart failure: Secondary | ICD-10-CM | POA: Diagnosis not present

## 2018-01-14 DIAGNOSIS — E119 Type 2 diabetes mellitus without complications: Secondary | ICD-10-CM | POA: Diagnosis not present

## 2018-01-14 DIAGNOSIS — S0993XA Unspecified injury of face, initial encounter: Secondary | ICD-10-CM | POA: Diagnosis not present

## 2018-01-14 LAB — BASIC METABOLIC PANEL
Anion gap: 14 (ref 5–15)
BUN: 19 mg/dL (ref 6–20)
CO2: 24 mmol/L (ref 22–32)
Calcium: 8.7 mg/dL — ABNORMAL LOW (ref 8.9–10.3)
Chloride: 101 mmol/L (ref 101–111)
Creatinine, Ser: 1.47 mg/dL — ABNORMAL HIGH (ref 0.61–1.24)
GFR calc Af Amer: 52 mL/min — ABNORMAL LOW (ref >60)
GFR calc non Af Amer: 45 mL/min — ABNORMAL LOW (ref >60)
Glucose, Bld: 204 mg/dL — ABNORMAL HIGH (ref 65–99)
Potassium: 3.7 mmol/L (ref 3.5–5.1)
Sodium: 139 mmol/L (ref 135–145)

## 2018-01-14 LAB — CBC WITH DIFFERENTIAL/PLATELET
Basophils Absolute: 0.1 10*3/uL (ref 0.0–0.1)
Basophils Relative: 1 %
Eosinophils Absolute: 0.4 10*3/uL (ref 0.0–0.7)
Eosinophils Relative: 3 %
HCT: 31.4 % — ABNORMAL LOW (ref 39.0–52.0)
Hemoglobin: 9.2 g/dL — ABNORMAL LOW (ref 13.0–17.0)
Lymphocytes Relative: 25 %
Lymphs Abs: 3 10*3/uL (ref 0.7–4.0)
MCH: 23.1 pg — ABNORMAL LOW (ref 26.0–34.0)
MCHC: 29.3 g/dL — ABNORMAL LOW (ref 30.0–36.0)
MCV: 78.9 fL (ref 78.0–100.0)
Monocytes Absolute: 0.8 10*3/uL (ref 0.1–1.0)
Monocytes Relative: 7 %
Neutro Abs: 7.8 10*3/uL — ABNORMAL HIGH (ref 1.7–7.7)
Neutrophils Relative %: 64 %
Platelets: 308 10*3/uL (ref 150–400)
RBC: 3.98 MIL/uL — ABNORMAL LOW (ref 4.22–5.81)
RDW: 14.9 % (ref 11.5–15.5)
WBC: 12.1 10*3/uL — ABNORMAL HIGH (ref 4.0–10.5)

## 2018-01-14 MED ORDER — FENTANYL CITRATE (PF) 100 MCG/2ML IJ SOLN
100.0000 ug | Freq: Once | INTRAMUSCULAR | Status: AC
  Administered 2018-01-14: 100 ug via INTRAVENOUS
  Filled 2018-01-14: qty 2

## 2018-01-14 MED ORDER — HYDROMORPHONE HCL 1 MG/ML IJ SOLN
1.0000 mg | Freq: Once | INTRAMUSCULAR | Status: AC
  Administered 2018-01-14: 1 mg via INTRAVENOUS
  Filled 2018-01-14: qty 1

## 2018-01-14 NOTE — ED Triage Notes (Signed)
Per EMS pt was at home opening door for son and was startled by a noise behind him, lost balance, and fell forward. Left shoulder deformity, left wrist deformity, numbness to left hand with cap refill less than right side. Pt has possible deformity to nose. No LOC.  A&O. No recent falls. Does not normally use cane or walker. EMS states pt hypotensive on scene with lying palpated bp of 94 and unable to auscultate when standing.  Pt has no N/V/D but does complain of dizziness since the fall.

## 2018-01-14 NOTE — ED Provider Notes (Addendum)
[facility] EMERGENCY DEPARTMENT Provider Note   CSN: [id number] Arrival date & time: 01/14/18  2158     History   Chief Complaint Chief Complaint  Patient presents with  . Fall    HPI Casey Gilmore is a 77 y.o. male.  Patient presents to the emergency department with chief complaint of fall.  He states that he was coming out of the bathroom tonight and heard a noise behind him.  He turned around quickly and tripped and fell.  He landed on his left shoulder and left wrist and also struck his nose on the floor.  He complains of pain in his nose, left shoulder, and left wrist.  The symptoms are worsened with palpation and movement.  He denies loss of consciousness.  He denies being anticoagulated.  He denies any pain in his chest, shortness of breath, or abdominal pain.  Denies any pain in his hips or lower extremities.  He denies numbness, weakness, or tingling.  He rates his pain is moderate.   The history is provided by the patient. No language interpreter was used.    Past Medical History:  Diagnosis Date  . Anemia    2012  . Arthritis   . Blood transfusion   . Cancer    VOCAL CORD  . COPD (chronic obstructive pulmonary disease)   . Diabetes mellitus   . GERD (gastroesophageal reflux disease)   . Heart murmur   . Hiatal hernia   . Hyperlipidemia   . Hypertension   . NSTEMI (non-ST elevated myocardial infarction) 07/2011   in setting of Pneumonia with normal coronary arteries on cath  . Pneumonia hx 8/12  . Severe aortic stenosis 2012   s/p bioprosthetic AVR  . Shortness of breath    with exertion  . Upper GI bleeding     Patient Active Problem List   Diagnosis Date Noted  . COPD (chronic obstructive pulmonary disease) with chronic bronchitis ([place]) 10/29/2014  . Chronic diastolic heart failure ([place]) 06/24/2014  . Benign essential HTN 06/24/2014  . 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 History of non-ST elevation myocardial infarction (NSTEMI)  06/24/2014  . Aortic stenosis 10/17/2011  . Diabetes mellitus ([place]) 10/17/2011  . Osteoarthritis 10/17/2011    Past Surgical History:  Procedure Laterality Date  . AORTIC VALVE REPLACEMENT  10/15/2011   Procedure: AORTIC VALVE REPLACEMENT (AVR);  Surgeon: Tharon Aquas Adelene Idler, MD;  Location: [facility];  Service: Open Heart Surgery;  Laterality: N/A;  . CARDIAC CATHETERIZATION  results on chart   2012 w no significant disease,echo with Severe Aortic stenosis,LVH,Diastolic dysfunction.   . COLONOSCOPY  12/2004  . EYE SURGERY  bil cat 08  . FRACTURE SURGERY  pin in lft hip  . JOINT REPLACEMENT  left knee 08, rt hip 08  . LARYNGOSCOPY  01/05/2013   Procedure: LARYNGOSCOPY;  Surgeon: Melida Quitter, MD;  Location: [facility];  Service: ENT;  Laterality: N/A;  micro direct laryngoscopy with biopsy, possible co2 laser, small laser-safe endotracheal tube  . LARYNGOSCOPY N/A 02/08/2013   Procedure: LARYNGOSCOPY;  Surgeon: Melida Quitter, MD;  Location: [facility];  Service: ENT;  Laterality: N/A;  Micro Direct Laryngoscopy with Co2 laser with re-excision right vocal cord lesion.  . TONSILLECTOMY    . TOTAL HIP ARTHROPLASTY Right   . TOTAL KNEE ARTHROPLASTY Left   . 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 Authorizing Provider  aspirin EC 81 MG tablet Take 81 mg by mouth daily.    [provider]  cyanocobalamin 2000 MCG tablet Take 4,000 mcg by mouth daily.     [provider]  Dulaglutide 1.5 MG/0.5ML SOPN Inject 1.5 mg into the skin once a week.    [provider]  ezetimibe-simvastatin (VYTORIN) 10-40 MG per tablet Take 1 tablet by mouth at bedtime.     [provider]  furosemide (LASIX) 40 MG tablet Take 40 mg by mouth daily as needed (swelling).     [provider]  glimepiride (AMARYL) 4 MG tablet Take 4  mg by mouth daily before breakfast.     [provider]  HYDROcodone-acetaminophen (NORCO/VICODIN) 5-325 MG per tablet Take 1 tablet by mouth every 6 (six) hours as needed for pain.     [provider]  insulin glargine (LANTUS SOLOSTAR) 100 UNIT/ML injection Inject 50 Units into the skin at bedtime.    [provider]  metFORMIN (GLUCOPHAGE) 1000 MG tablet Take 1,000 mg by mouth 2 (two) times daily with a meal.    [provider]  omeprazole (PRILOSEC) 40 MG capsule Take 40 mg by mouth daily.    [provider]  potassium chloride SA (K-DUR,KLOR-CON) 20 MEQ tablet Take 20 mEq by mouth daily.      [provider]    Family History No family history on file.  Social History Social History   Tobacco Use  . Smoking status: Former Smoker    Packs/day: 2.00    Years: 25.00    Pack years: 50.00    Types: Cigarettes    Last attempt to quit: 12/06/1978    Years since quitting: 39.1  . Smokeless tobacco: Never Used  Substance Use Topics  . Alcohol use: No  . Drug use: No     Allergies   Patient has no known allergies.   Review of Systems Review of Systems  All other systems reviewed and are negative.    Physical Exam Updated Vital Signs There were no vitals taken for this visit.  Physical Exam  Constitutional: He is oriented to person, place, and time. He appears well-developed and well-nourished.  HENT:  Head: Normocephalic and atraumatic.  Minor abrasion to nose, with old dried blood  Eyes: Conjunctivae and EOM are normal. Pupils are equal, round, and reactive to light. Right eye exhibits no discharge. Left eye exhibits no discharge. No scleral icterus.  Neck: Normal range of motion. Neck supple. No JVD present.  Cardiovascular: Normal rate, regular rhythm and normal heart sounds. Exam reveals no gallop and no friction rub.  No murmur heard. Pulmonary/Chest: Effort normal and breath sounds normal. No respiratory  distress. He has no wheezes. He has no rales. He exhibits no tenderness.  Abdominal: Soft. He exhibits no distension and no mass. There is no tenderness. There is no rebound and no guarding.  Musculoskeletal: Normal range of motion. He exhibits no edema or tenderness.  Left shoulder tender to palpation, range of motion and strength limited secondary to pain  Left wrist tender to palpation with possible bony deformity, range of motion and strength limited secondary to pain  Neurological: He is alert and oriented to person, place, and time.  Skin: Skin is warm and dry.  Psychiatric: He has a normal mood and affect. His behavior is normal. Judgment and thought content normal.  Nursing note and  vitals reviewed.    ED Treatments / Results  Labs (all labs ordered are listed, but only abnormal results are displayed) Labs Reviewed - No data to display  EKG  EKG Interpretation None       Radiology Dg Wrist 2 Views Left  Result Date: 01/15/2018 CLINICAL DATA:  Post reduction EXAM: LEFT WRIST - 2 VIEW COMPARISON:  01/14/2018 FINDINGS: Interval casting of the wrist. Displaced ulnar styloid process fracture. Comminuted distal radius fracture with close to 1/2 bone with of radial displacement of distal fracture fragment, not significantly changed. IMPRESSION: Single view demonstrates casting of the wrist. Comminuted and displaced distal radius fracture without significant interval change on single view. Ulnar styloid process fracture Electronically Signed   By: Donavan Foil M.D.   On: 01/15/2018 02:54   Dg Wrist Complete Left  Result Date: 01/14/2018 CLINICAL DATA:  Per EMS pt was at home opening door for son and was startled by a noise behind him, lost balance, and fell forward. Left shoulder deformity, left wrist deformity, numbness to left hand with cap refill less than right side. Pt has possible deformity to nose. No LOC. A&O. No recent falls. Does not normally use cane or walker. EMS states  pt hypotensive on scene with lying palpated bp of 94 and unable to auscultate when standing. Pt has no N/V/D but does complain of dizziness since the fall. EXAM: LEFT WRIST - COMPLETE 3+ VIEW COMPARISON:  None. FINDINGS: There is a displaced comminuted fracture of the distal radius. Primary fracture is transverse across the metaphysis. Primary distal fracture component is displaced 10 mm in a radial direction and 6 mm dorsally. Fracture is mildly impacted. There is dorsal angulation of the articular surface measuring 26 degrees. Small focus of density is seen just distal to the distal ulna, likely a small avulsion from the ulnar styloid. No other evidence of a fracture.  No dislocation. There is surrounding soft tissue swelling. The bones are demineralized. IMPRESSION: 1. Displaced comminuted fracture of the distal radial metaphysis as described. Probable small avulsion fracture from the styloid process of the ulna. No dislocation. Electronically Signed   By: Lajean Manes M.D.   On: 01/14/2018 23:06   Ct Head Wo Contrast  Result Date: 01/14/2018 CLINICAL DATA:  Fall with left shoulder deformity, nose deformity EXAM: CT HEAD WITHOUT CONTRAST CT MAXILLOFACIAL WITHOUT CONTRAST CT CERVICAL SPINE WITHOUT CONTRAST TECHNIQUE: Multidetector CT imaging of the head, cervical spine, and maxillofacial structures were performed using the standard protocol without intravenous contrast. Multiplanar CT image reconstructions of the cervical spine and maxillofacial structures were also generated. COMPARISON:  None. FINDINGS: CT HEAD FINDINGS Brain: No acute territorial infarction, hemorrhage or intracranial mass is visualized. Mild small vessel ischemic changes of the white matter. Moderate atrophy. Ventricle size within normal limits Vascular: No hyperdense vessels. Scattered calcifications at the carotid siphons. Skull: Normal. Negative for fracture or focal lesion. Other: None CT MAXILLOFACIAL FINDINGS Osseous: Bilateral  mandibular heads are normally positioned. No mandibular fracture. Trace fluid in the right inferior mastoid. Zygomatic arches and pterygoid plates are intact. Minimal irregularity of the anterior nasal bones with vague lucency and step-off deformity on sagittal views suspect for minimal nasal bone fracture. Orbits: Negative. No traumatic or inflammatory finding. Sinuses: No sinus wall fracture. Small fluid level in the right maxillary sinus. Mucosal thickening in the ethmoid and sphenoid sinuses. Soft tissues: Mild soft tissue swelling over the nasal area. CT CERVICAL SPINE FINDINGS Alignment: No subluxation.  Facet alignment within normal limits. Skull  base and vertebrae: No acute fracture. No primary bone lesion or focal pathologic process. Soft tissues and spinal canal: No prevertebral fluid or swelling. No visible canal hematoma. Disc levels: Moderate-to-marked degenerative changes at C5-C6 and C6-C7. Upper chest: Lung apices clear. No thyroid mass. Carotid vascular calcification. Other: None IMPRESSION: 1. No CT evidence for acute intracranial abnormality. 2. No acute osseous abnormality of the cervical spine. 3. Soft tissue swelling over the nasal area with minimal nasal bone deformity, suspect for fracture. Electronically Signed   By: Donavan Foil M.D.   On: 01/14/2018 23:14   Ct Cervical Spine Wo Contrast  Result Date: 01/14/2018 CLINICAL DATA:  Fall with left shoulder deformity, nose deformity EXAM: CT HEAD WITHOUT CONTRAST CT MAXILLOFACIAL WITHOUT CONTRAST CT CERVICAL SPINE WITHOUT CONTRAST TECHNIQUE: Multidetector CT imaging of the head, cervical spine, and maxillofacial structures were performed using the standard protocol without intravenous contrast. Multiplanar CT image reconstructions of the cervical spine and maxillofacial structures were also generated. COMPARISON:  None. FINDINGS: CT HEAD FINDINGS Brain: No acute territorial infarction, hemorrhage or intracranial mass is visualized. Mild small  vessel ischemic changes of the white matter. Moderate atrophy. Ventricle size within normal limits Vascular: No hyperdense vessels. Scattered calcifications at the carotid siphons. Skull: Normal. Negative for fracture or focal lesion. Other: None CT MAXILLOFACIAL FINDINGS Osseous: Bilateral mandibular heads are normally positioned. No mandibular fracture. Trace fluid in the right inferior mastoid. Zygomatic arches and pterygoid plates are intact. Minimal irregularity of the anterior nasal bones with vague lucency and step-off deformity on sagittal views suspect for minimal nasal bone fracture. Orbits: Negative. No traumatic or inflammatory finding. Sinuses: No sinus wall fracture. Small fluid level in the right maxillary sinus. Mucosal thickening in the ethmoid and sphenoid sinuses. Soft tissues: Mild soft tissue swelling over the nasal area. CT CERVICAL SPINE FINDINGS Alignment: No subluxation.  Facet alignment within normal limits. Skull base and vertebrae: No acute fracture. No primary bone lesion or focal pathologic process. Soft tissues and spinal canal: No prevertebral fluid or swelling. No visible canal hematoma. Disc levels: Moderate-to-marked degenerative changes at C5-C6 and C6-C7. Upper chest: Lung apices clear. No thyroid mass. Carotid vascular calcification. Other: None IMPRESSION: 1. No CT evidence for acute intracranial abnormality. 2. No acute osseous abnormality of the cervical spine. 3. Soft tissue swelling over the nasal area with minimal nasal bone deformity, suspect for fracture. Electronically Signed   By: Donavan Foil M.D.   On: 01/14/2018 23:14   Dg Shoulder Left  Result Date: 01/14/2018 CLINICAL DATA:  Per EMS pt was at home opening door for son and was startled by a noise behind him, lost balance, and fell forward. Left shoulder deformity, left wrist deformity, numbness to left hand with cap refill less than right side. Pt has possible deformity to nose. No LOC. A&O. No recent falls.  Does not normally use cane or walker. EMS states pt hypotensive on scene with lying palpated bp of 94 and unable to auscultate when standing. Pt has no N/V/D but does complain of dizziness since the fall. EXAM: LEFT SHOULDER - 2+ VIEW COMPARISON:  None. FINDINGS: There is a comminuted fracture of the proximal humerus. There is a fracture that extends across the anatomic neck. There is a separate comminuted fracture component of the greater tuberosity. The shaft fracture component has retracted superiorly by 2-1/2 cm and is displaced anteriorly. The humeral head component remains normally aligned with the glenoid. There are no other fractures.  The [facility] joint is normally aligned.  Bones are diffusely demineralized. There is surrounding soft tissue swelling. IMPRESSION: Comminuted displaced fracture of the proximal humerus. No dislocation. Electronically Signed   By: Lajean Manes M.D.   On: 01/14/2018 23:08   Dg Hip Unilat With Pelvis 2-3 Views Left  Result Date: 01/15/2018 CLINICAL DATA:  Status post fall, with concern for left hip injury. Initial encounter. EXAM: DG HIP (WITH OR WITHOUT PELVIS) 2-3V LEFT COMPARISON:  None. FINDINGS: There is no evidence of fracture or dislocation. The patient is status post fusion of the left hip, with associated hardware. The right hip arthroplasty is grossly unremarkable in appearance, though incompletely imaged. There is no evidence of loosening. The sacroiliac joints are grossly unremarkable. Mild degenerative change is noted along the lower lumbar spine. The visualized bowel gas pattern is within normal limits. IMPRESSION: Status post fusion of the left hip, with associated hardware. No evidence of loosening. No evidence of fracture. Electronically Signed   By: Garald Balding M.D.   On: 01/15/2018 00:59   Ct Maxillofacial Wo Contrast  Result Date: 01/14/2018 CLINICAL DATA:  Fall with left shoulder deformity, nose deformity EXAM: CT HEAD WITHOUT CONTRAST CT MAXILLOFACIAL  WITHOUT CONTRAST CT CERVICAL SPINE WITHOUT CONTRAST TECHNIQUE: Multidetector CT imaging of the head, cervical spine, and maxillofacial structures were performed using the standard protocol without intravenous contrast. Multiplanar CT image reconstructions of the cervical spine and maxillofacial structures were also generated. COMPARISON:  None. FINDINGS: CT HEAD FINDINGS Brain: No acute territorial infarction, hemorrhage or intracranial mass is visualized. Mild small vessel ischemic changes of the white matter. Moderate atrophy. Ventricle size within normal limits Vascular: No hyperdense vessels. Scattered calcifications at the carotid siphons. Skull: Normal. Negative for fracture or focal lesion. Other: None CT MAXILLOFACIAL FINDINGS Osseous: Bilateral mandibular heads are normally positioned. No mandibular fracture. Trace fluid in the right inferior mastoid. Zygomatic arches and pterygoid plates are intact. Minimal irregularity of the anterior nasal bones with vague lucency and step-off deformity on sagittal views suspect for minimal nasal bone fracture. Orbits: Negative. No traumatic or inflammatory finding. Sinuses: No sinus wall fracture. Small fluid level in the right maxillary sinus. Mucosal thickening in the ethmoid and sphenoid sinuses. Soft tissues: Mild soft tissue swelling over the nasal area. CT CERVICAL SPINE FINDINGS Alignment: No subluxation.  Facet alignment within normal limits. Skull base and vertebrae: No acute fracture. No primary bone lesion or focal pathologic process. Soft tissues and spinal canal: No prevertebral fluid or swelling. No visible canal hematoma. Disc levels: Moderate-to-marked degenerative changes at C5-C6 and C6-C7. Upper chest: Lung apices clear. No thyroid mass. Carotid vascular calcification. Other: None IMPRESSION: 1. No CT evidence for acute intracranial abnormality. 2. No acute osseous abnormality of the cervical spine. 3. Soft tissue swelling over the nasal area with  minimal nasal bone deformity, suspect for fracture. Electronically Signed   By: Donavan Foil M.D.   On: 01/14/2018 23:14    Procedures Reduction of fracture Date/Time: 01/15/2018 3:11 AM Performed by: Montine Circle, PA-C Authorized by: Ezequiel Essex, MD  Consent: Verbal consent obtained. Risks and benefits: risks, benefits and alternatives were discussed Consent given by: patient Patient understanding: patient states understanding of the procedure being performed Patient consent: the patient's understanding of the procedure matches consent given Procedure consent: procedure consent matches procedure scheduled Relevant documents: relevant documents present and verified Test results: test results available and properly labeled Site marked: the operative site was marked Imaging studies: imaging studies available Required items: required blood products, implants, devices, and special equipment  available Patient identity confirmed: verbally with patient Time out: Immediately prior to procedure a "time out" was called to verify the correct patient, procedure, equipment, support staff and site/side marked as required. Local anesthesia used: yes Anesthesia: hematoma block  Anesthesia: Local anesthesia used: yes Local Anesthetic: lidocaine 1% without epinephrine Anesthetic total: 5 mL  Sedation: Patient sedated: no  Patient tolerance: Patient tolerated the procedure well with no immediate complications Comments: Good anatomical alignment laterally, no longer appears angulated on physical exam.    (including critical care time) SPLINT APPLICATION Date/Time: 2:69 AM Authorized by: Montine Circle Consent: Verbal consent obtained. Risks and benefits: risks, benefits and alternatives were discussed Consent given by: patient Splint applied by: orthopedic technician Location details: left wrist Splint type: sugartong Supplies used: orthoglass Post-procedure: The splinted body  part was neurovascularly unchanged following the procedure. Patient tolerance: Patient tolerated the procedure well with no immediate complications.    Medications Ordered in ED Medications  fentaNYL (SUBLIMAZE) injection 100 mcg (not administered)     Initial Impression / Assessment and Plan / ED Course  I have reviewed the triage vital signs and the nursing notes.  Pertinent labs & imaging results that were available during my care of the patient were reviewed by me and considered in my medical decision making (see chart for details).     Patient presents for mechanical fall.  He states that he stumbled and fell landing on his left wrist, shoulder, and hip.  He also hit his nose.  He denies passing out.  He states that he was startled after exiting the bathroom, this caused him to turn around quickly, which led to his fall.  He complains of pain in the left shoulder, wrist, nose which is worsened with palpation.  Will check appropriate imaging, and will reassess.  CT of head, face, and cervical spine are notable for probable nasal bone fracture, there is a minor abrasion to his nose, but I doubt that this is an open fracture.  Left shoulder is remarkable for a comminuted and displaced proximal humerus fracture.  Will place patient in a sling immobilizer.  He will need to follow-up with orthopedics regarding this.  Left wrist is remarkable for comminuted and displaced left radius fracture and probable styloid fracture.  I discussed the case with Dr. Lenon Curt, who is on-call for hand surgery, and recommends splinting and follow-up.  Dr. Wyvonnia Dusky and myself reduced the angulated portion of the fracture.  Patient was placed in a fiberglass sugar tong splint.  Reduction shows no improvement on 1 view x-ray, but angulation no longer present on exam.  Unable to obtain lateral view for post reduction assessment.  Patient wishes to be discharged home.  I will give him a dose pain medicine prior to  discharge as well as prescribed pain medicine for home.  He understands and agrees with the follow-up plan.  His family members are with him, and live next door.  They will be able to help care for the patient. Final Clinical Impressions(s) / ED Diagnoses   Final diagnoses:  Fall in home, initial encounter  Closed fracture of nasal bone, initial encounter  Closed fracture of left wrist, initial encounter  Closed fracture of proximal end of left humerus, unspecified fracture morphology, initial encounter    ED Discharge Orders        Ordered    HYDROcodone-acetaminophen (NORCO/VICODIN) 5-325 MG tablet  Every 4 hours PRN     01/15/18 0220       Marlon Pel,  Sharyn Blitz 01/15/18 0315    Virgel Manifold, MD 01/15/18 1617    Montine Circle, PA-C 01/24/18 2159    Ezequiel Essex, MD 01/25/18 [phone]

## 2018-01-14 NOTE — ED Notes (Signed)
Patient transported to CT scan and X-ray.  

## 2018-01-15 ENCOUNTER — Emergency Department (HOSPITAL_COMMUNITY): Payer: Medicare Other

## 2018-01-15 DIAGNOSIS — S79912A Unspecified injury of left hip, initial encounter: Secondary | ICD-10-CM | POA: Diagnosis not present

## 2018-01-15 MED ORDER — MIDAZOLAM HCL 2 MG/2ML IJ SOLN
1.0000 mg | Freq: Once | INTRAMUSCULAR | Status: AC
  Administered 2018-01-15: 1 mg via INTRAVENOUS

## 2018-01-15 MED ORDER — MIDAZOLAM HCL 2 MG/2ML IJ SOLN
INTRAMUSCULAR | Status: AC
  Filled 2018-01-15: qty 2

## 2018-01-15 MED ORDER — HYDROCODONE-ACETAMINOPHEN 5-325 MG PO TABS
2.0000 | ORAL_TABLET | Freq: Once | ORAL | Status: AC
  Administered 2018-01-15: 2 via ORAL
  Filled 2018-01-15: qty 2

## 2018-01-15 MED ORDER — LIDOCAINE HCL (PF) 1 % IJ SOLN
30.0000 mL | Freq: Once | INTRAMUSCULAR | Status: AC
  Administered 2018-01-15: 30 mL via INTRADERMAL
  Filled 2018-01-15: qty 30

## 2018-01-15 MED ORDER — HYDROCODONE-ACETAMINOPHEN 5-325 MG PO TABS: 2.0000 | ORAL_TABLET | ORAL | 0 refills | Status: DC | PRN

## 2018-01-15 MED ORDER — HYDROMORPHONE HCL 1 MG/ML IJ SOLN
1.0000 mg | Freq: Once | INTRAMUSCULAR | Status: AC
  Administered 2018-01-15: 1 mg via INTRAVENOUS
  Filled 2018-01-15: qty 1

## 2018-01-15 NOTE — Progress Notes (Signed)
Orthopedic Tech Progress Note Patient Details:  Casey Gilmore [birth year] [id number]  Ortho Devices Type of Ortho Device: Sling immobilizer, Sugartong splint Ortho Device/Splint Location: lue Ortho Device/Splint Interventions: Ordered, Application, Adjustment   Post Interventions Patient Tolerated: Well Instructions Provided: Care of device, Adjustment of device   [name] 01/15/2018, 3:12 AM

## 2018-01-15 NOTE — ED Notes (Signed)
Patient transported to X-ray 

## 2018-01-17 NOTE — ED Provider Notes (Signed)
Medical screening examination/treatment/procedure(s) were conducted as a shared visit with non-physician practitioner(s) and myself.  I personally evaluated the patient during the encounter.  Assisted [facility] with reduction and splinting of radius fracture. Observation admission offered to patient and family but they wished to go home.   EKG Interpretation None        Ezequiel Essex, MD 01/17/18 2238

## 2018-01-18 ENCOUNTER — Other Ambulatory Visit: Payer: Self-pay

## 2018-01-18 ENCOUNTER — Encounter (HOSPITAL_COMMUNITY): Payer: Self-pay | Admitting: *Deleted

## 2018-01-18 DIAGNOSIS — S52502A Unspecified fracture of the lower end of left radius, initial encounter for closed fracture: Secondary | ICD-10-CM | POA: Diagnosis not present

## 2018-01-18 NOTE — Progress Notes (Addendum)
Spoke with pt for pre-op call. Pt has hx of CAD and Aortic Valve replacement. Has not seen cardiologist in 2 years. States he's not had any heart issues or chest pain. Has shortness of breath due to COPD. Pt is a type 2 diabetic. He can't remember what his last A1C was. States it was done in the last 3 months at Dr. Illene Regulus office. Pt states his fasting blood sugar is usually in the 140's. Pt instructed to take 1/2 of his regular dose of Xultophy (will take 20 units) this evening. Instructed pt to check his blood sugar when he gets up in the AM and every 2 hours until he leaves for the hospital. If blood sugar is 70 or below, treat with 1/2 cup of clear juice (apple or cranberry) and recheck blood sugar 15 minutes after drinking juice. If blood sugar continues to be 70 or below, call the Short Stay department and ask to speak to a nurse. Pt voiced understanding.

## 2018-01-19 ENCOUNTER — Encounter (HOSPITAL_COMMUNITY)
Admission: RE | Disposition: A | Discharge status: Home Health | Payer: Self-pay | Source: Ambulatory Visit | Attending: Orthopedic Surgery

## 2018-01-19 ENCOUNTER — Inpatient Hospital Stay (HOSPITAL_COMMUNITY)
Admission: RE | Admit: 2018-01-19 | Discharge: 2018-01-22 | DRG: 493 | Disposition: A | Discharge status: Home Health | Payer: Medicare Other | Source: Ambulatory Visit | Attending: Orthopedic Surgery | Admitting: Orthopedic Surgery

## 2018-01-19 ENCOUNTER — Inpatient Hospital Stay (HOSPITAL_COMMUNITY): Payer: Medicare Other | Admitting: Certified Registered"

## 2018-01-19 ENCOUNTER — Inpatient Hospital Stay (HOSPITAL_COMMUNITY): Payer: Medicare Other

## 2018-01-19 ENCOUNTER — Encounter (HOSPITAL_COMMUNITY): Payer: Self-pay | Admitting: *Deleted

## 2018-01-19 DIAGNOSIS — D62 Acute posthemorrhagic anemia: Secondary | ICD-10-CM | POA: Diagnosis not present

## 2018-01-19 DIAGNOSIS — Z953 Presence of xenogenic heart valve: Secondary | ICD-10-CM

## 2018-01-19 DIAGNOSIS — W19XXXA Unspecified fall, initial encounter: Secondary | ICD-10-CM | POA: Diagnosis present

## 2018-01-19 DIAGNOSIS — J449 Chronic obstructive pulmonary disease, unspecified: Secondary | ICD-10-CM | POA: Diagnosis present

## 2018-01-19 DIAGNOSIS — Z7984 Long term (current) use of oral hypoglycemic drugs: Secondary | ICD-10-CM | POA: Diagnosis not present

## 2018-01-19 DIAGNOSIS — Z8521 Personal history of malignant neoplasm of larynx: Secondary | ICD-10-CM

## 2018-01-19 DIAGNOSIS — G8918 Other acute postprocedural pain: Secondary | ICD-10-CM | POA: Diagnosis not present

## 2018-01-19 DIAGNOSIS — Z7982 Long term (current) use of aspirin: Secondary | ICD-10-CM | POA: Diagnosis not present

## 2018-01-19 DIAGNOSIS — S42242A 4-part fracture of surgical neck of left humerus, initial encounter for closed fracture: Secondary | ICD-10-CM | POA: Diagnosis present

## 2018-01-19 DIAGNOSIS — E785 Hyperlipidemia, unspecified: Secondary | ICD-10-CM | POA: Diagnosis present

## 2018-01-19 DIAGNOSIS — M19019 Primary osteoarthritis, unspecified shoulder: Secondary | ICD-10-CM | POA: Diagnosis present

## 2018-01-19 DIAGNOSIS — E119 Type 2 diabetes mellitus without complications: Secondary | ICD-10-CM | POA: Diagnosis not present

## 2018-01-19 DIAGNOSIS — S52502A Unspecified fracture of the lower end of left radius, initial encounter for closed fracture: Secondary | ICD-10-CM | POA: Diagnosis not present

## 2018-01-19 DIAGNOSIS — S42309A Unspecified fracture of shaft of humerus, unspecified arm, initial encounter for closed fracture: Secondary | ICD-10-CM

## 2018-01-19 DIAGNOSIS — Z87891 Personal history of nicotine dependence: Secondary | ICD-10-CM

## 2018-01-19 DIAGNOSIS — S42202A Unspecified fracture of upper end of left humerus, initial encounter for closed fracture: Secondary | ICD-10-CM | POA: Diagnosis not present

## 2018-01-19 DIAGNOSIS — Z96641 Presence of right artificial hip joint: Secondary | ICD-10-CM | POA: Diagnosis present

## 2018-01-19 DIAGNOSIS — Z96652 Presence of left artificial knee joint: Secondary | ICD-10-CM | POA: Diagnosis present

## 2018-01-19 DIAGNOSIS — M1990 Unspecified osteoarthritis, unspecified site: Secondary | ICD-10-CM | POA: Diagnosis not present

## 2018-01-19 DIAGNOSIS — K449 Diaphragmatic hernia without obstruction or gangrene: Secondary | ICD-10-CM | POA: Diagnosis present

## 2018-01-19 DIAGNOSIS — S52602A Unspecified fracture of lower end of left ulna, initial encounter for closed fracture: Secondary | ICD-10-CM

## 2018-01-19 DIAGNOSIS — S52592D Other fractures of lower end of left radius, subsequent encounter for closed fracture with routine healing: Secondary | ICD-10-CM | POA: Diagnosis not present

## 2018-01-19 DIAGNOSIS — I252 Old myocardial infarction: Secondary | ICD-10-CM | POA: Diagnosis not present

## 2018-01-19 DIAGNOSIS — M19012 Primary osteoarthritis, left shoulder: Secondary | ICD-10-CM | POA: Diagnosis present

## 2018-01-19 DIAGNOSIS — I10 Essential (primary) hypertension: Secondary | ICD-10-CM | POA: Diagnosis not present

## 2018-01-19 DIAGNOSIS — S5290XA Unspecified fracture of unspecified forearm, initial encounter for closed fracture: Secondary | ICD-10-CM

## 2018-01-19 DIAGNOSIS — K219 Gastro-esophageal reflux disease without esophagitis: Secondary | ICD-10-CM | POA: Diagnosis present

## 2018-01-19 DIAGNOSIS — N179 Acute kidney failure, unspecified: Secondary | ICD-10-CM | POA: Diagnosis not present

## 2018-01-19 DIAGNOSIS — R011 Cardiac murmur, unspecified: Secondary | ICD-10-CM | POA: Diagnosis present

## 2018-01-19 DIAGNOSIS — S62109A Fracture of unspecified carpal bone, unspecified wrist, initial encounter for closed fracture: Secondary | ICD-10-CM

## 2018-01-19 DIAGNOSIS — S42302D Unspecified fracture of shaft of humerus, left arm, subsequent encounter for fracture with routine healing: Secondary | ICD-10-CM | POA: Diagnosis not present

## 2018-01-19 HISTORY — DX: Unspecified fracture of upper end of left humerus, initial encounter for closed fracture: S42.202A

## 2018-01-19 HISTORY — DX: Unspecified fracture of the lower end of left radius, initial encounter for closed fracture: S52.502A

## 2018-01-19 HISTORY — DX: Unspecified fracture of lower end of left ulna, initial encounter for closed fracture: S52.602A

## 2018-01-19 HISTORY — PX: OPEN REDUCTION INTERNAL FIXATION (ORIF) DISTAL RADIAL FRACTURE: SHX5989

## 2018-01-19 HISTORY — PX: ORIF HUMERUS FRACTURE: SHX2126

## 2018-01-19 LAB — CBC
HCT: 28.5 % — ABNORMAL LOW (ref 39.0–52.0)
Hemoglobin: 8.4 g/dL — ABNORMAL LOW (ref 13.0–17.0)
MCH: 22.9 pg — AB (ref 26.0–34.0)
MCHC: 29.5 g/dL — AB (ref 30.0–36.0)
MCV: 77.7 fL — ABNORMAL LOW (ref 78.0–100.0)
PLATELETS: 300 10*3/uL (ref 150–400)
RBC: 3.67 MIL/uL — AB (ref 4.22–5.81)
RDW: 15.1 % (ref 11.5–15.5)
WBC: 9.1 10*3/uL (ref 4.0–10.5)

## 2018-01-19 LAB — GLUCOSE, CAPILLARY
GLUCOSE-CAPILLARY: 245 mg/dL — AB (ref 65–99)
GLUCOSE-CAPILLARY: 247 mg/dL — AB (ref 65–99)
Glucose-Capillary: 213 mg/dL — ABNORMAL HIGH (ref 65–99)
Glucose-Capillary: 198 mg/dL — ABNORMAL HIGH (ref 65–99)

## 2018-01-19 LAB — HEMOGLOBIN A1C
HEMOGLOBIN A1C: 7 % — AB (ref 4.8–5.6)
MEAN PLASMA GLUCOSE: 154.2 mg/dL

## 2018-01-19 LAB — CREATININE, SERUM
CREATININE: 1.28 mg/dL — AB (ref 0.61–1.24)
GFR calc Af Amer: >60 mL/min (ref >60)
GFR, EST NON AFRICAN AMERICAN: 53 mL/min — AB (ref >60)

## 2018-01-19 SURGERY — OPEN REDUCTION INTERNAL FIXATION (ORIF) PROXIMAL HUMERUS FRACTURE
Anesthesia: Regional | Laterality: Left

## 2018-01-19 MED ORDER — MORPHINE SULFATE (PF) 2 MG/ML IV SOLN
1.0000 mg | INTRAVENOUS | Status: DC | PRN
  Administered 2018-01-21: 2 mg via INTRAVENOUS
  Filled 2018-01-19: qty 1

## 2018-01-19 MED ORDER — ENOXAPARIN SODIUM 30 MG/0.3ML ~~LOC~~ SOLN
30.0000 mg | SUBCUTANEOUS | Status: DC
  Administered 2018-01-20 – 2018-01-22 (×3): 30 mg via SUBCUTANEOUS
  Filled 2018-01-19 (×3): qty 0.3

## 2018-01-19 MED ORDER — CEFAZOLIN SODIUM-DEXTROSE 1-4 GM/50ML-% IV SOLN
1.0000 g | Freq: Four times a day (QID) | INTRAVENOUS | Status: AC
  Administered 2018-01-19 – 2018-01-20 (×3): 1 g via INTRAVENOUS
  Filled 2018-01-19 (×3): qty 50

## 2018-01-19 MED ORDER — DOCUSATE SODIUM 100 MG PO CAPS
100.0000 mg | ORAL_CAPSULE | Freq: Two times a day (BID) | ORAL | Status: DC
  Administered 2018-01-19 – 2018-01-22 (×6): 100 mg via ORAL
  Filled 2018-01-19 (×6): qty 1

## 2018-01-19 MED ORDER — SUGAMMADEX SODIUM 200 MG/2ML IV SOLN
INTRAVENOUS | Status: AC
  Filled 2018-01-19: qty 2

## 2018-01-19 MED ORDER — ROCURONIUM BROMIDE 10 MG/ML (PF) SYRINGE
PREFILLED_SYRINGE | INTRAVENOUS | Status: AC
  Filled 2018-01-19: qty 5

## 2018-01-19 MED ORDER — PHENOL 1.4 % MT LIQD: 1.0000 | OROMUCOSAL | Status: DC | PRN

## 2018-01-19 MED ORDER — SUGAMMADEX SODIUM 200 MG/2ML IV SOLN
INTRAVENOUS | Status: DC | PRN
  Administered 2018-01-19: 200 mg via INTRAVENOUS

## 2018-01-19 MED ORDER — PANTOPRAZOLE SODIUM 40 MG PO TBEC
80.0000 mg | DELAYED_RELEASE_TABLET | Freq: Every day | ORAL | Status: DC
  Administered 2018-01-19 – 2018-01-22 (×4): 80 mg via ORAL
  Filled 2018-01-19 (×4): qty 2

## 2018-01-19 MED ORDER — PROPOFOL 10 MG/ML IV BOLUS
INTRAVENOUS | Status: DC | PRN
  Administered 2018-01-19: 130 mg via INTRAVENOUS

## 2018-01-19 MED ORDER — 0.9 % SODIUM CHLORIDE (POUR BTL) OPTIME
TOPICAL | Status: DC | PRN
  Administered 2018-01-19: 1000 mL

## 2018-01-19 MED ORDER — ONDANSETRON HCL 4 MG/2ML IJ SOLN: 4.0000 mg | Freq: Four times a day (QID) | INTRAMUSCULAR | Status: DC | PRN

## 2018-01-19 MED ORDER — PHENYLEPHRINE HCL 10 MG/ML IJ SOLN
INTRAVENOUS | Status: DC | PRN
  Administered 2018-01-19: 10 ug/min via INTRAVENOUS

## 2018-01-19 MED ORDER — DEXAMETHASONE SODIUM PHOSPHATE 10 MG/ML IJ SOLN
INTRAMUSCULAR | Status: DC | PRN
  Administered 2018-01-19: 10 mg via INTRAVENOUS

## 2018-01-19 MED ORDER — METOCLOPRAMIDE HCL 5 MG/ML IJ SOLN: 5.0000 mg | Freq: Three times a day (TID) | INTRAMUSCULAR | Status: DC | PRN

## 2018-01-19 MED ORDER — FENTANYL CITRATE (PF) 100 MCG/2ML IJ SOLN: 25.0000 ug | INTRAMUSCULAR | Status: DC | PRN

## 2018-01-19 MED ORDER — ROCURONIUM BROMIDE 10 MG/ML (PF) SYRINGE
PREFILLED_SYRINGE | INTRAVENOUS | Status: DC | PRN
  Administered 2018-01-19: 40 mg via INTRAVENOUS
  Administered 2018-01-19: 60 mg via INTRAVENOUS

## 2018-01-19 MED ORDER — FENTANYL CITRATE (PF) 100 MCG/2ML IJ SOLN
INTRAMUSCULAR | Status: DC | PRN
  Administered 2018-01-19 (×2): 50 ug via INTRAVENOUS
  Administered 2018-01-19: 100 ug via INTRAVENOUS
  Administered 2018-01-19: 50 ug via INTRAVENOUS

## 2018-01-19 MED ORDER — FENTANYL CITRATE (PF) 100 MCG/2ML IJ SOLN
100.0000 ug | Freq: Once | INTRAMUSCULAR | Status: AC
  Administered 2018-01-19: 100 ug via INTRAVENOUS

## 2018-01-19 MED ORDER — LIDOCAINE 2% (20 MG/ML) 5 ML SYRINGE
INTRAMUSCULAR | Status: AC
  Filled 2018-01-19: qty 5

## 2018-01-19 MED ORDER — METHOCARBAMOL 500 MG PO TABS
500.0000 mg | ORAL_TABLET | Freq: Four times a day (QID) | ORAL | Status: DC | PRN
  Administered 2018-01-19 – 2018-01-22 (×7): 500 mg via ORAL
  Filled 2018-01-19 (×8): qty 1

## 2018-01-19 MED ORDER — PHENYLEPHRINE 40 MCG/ML (10ML) SYRINGE FOR IV PUSH (FOR BLOOD PRESSURE SUPPORT)
PREFILLED_SYRINGE | INTRAVENOUS | Status: DC | PRN
  Administered 2018-01-19: 80 ug via INTRAVENOUS

## 2018-01-19 MED ORDER — ONDANSETRON HCL 4 MG PO TABS: 4.0000 mg | ORAL_TABLET | Freq: Three times a day (TID) | ORAL | 0 refills | Status: DC | PRN

## 2018-01-19 MED ORDER — METOCLOPRAMIDE HCL 5 MG PO TABS: 5.0000 mg | ORAL_TABLET | Freq: Three times a day (TID) | ORAL | Status: DC | PRN

## 2018-01-19 MED ORDER — ONDANSETRON HCL 4 MG PO TABS: 4.0000 mg | ORAL_TABLET | Freq: Four times a day (QID) | ORAL | Status: DC | PRN

## 2018-01-19 MED ORDER — INSULIN ASPART 100 UNIT/ML ~~LOC~~ SOLN
0.0000 [IU] | Freq: Three times a day (TID) | SUBCUTANEOUS | Status: DC
  Administered 2018-01-20: 5 [IU] via SUBCUTANEOUS
  Administered 2018-01-20 – 2018-01-21 (×3): 2 [IU] via SUBCUTANEOUS
  Administered 2018-01-21 – 2018-01-22 (×2): 3 [IU] via SUBCUTANEOUS

## 2018-01-19 MED ORDER — POTASSIUM CHLORIDE CRYS ER 20 MEQ PO TBCR
20.0000 meq | EXTENDED_RELEASE_TABLET | Freq: Every day | ORAL | Status: DC
  Administered 2018-01-20: 20 meq via ORAL
  Filled 2018-01-19: qty 1

## 2018-01-19 MED ORDER — GLIMEPIRIDE 4 MG PO TABS
4.0000 mg | ORAL_TABLET | Freq: Every day | ORAL | Status: DC
  Administered 2018-01-20 – 2018-01-22 (×3): 4 mg via ORAL
  Filled 2018-01-19 (×3): qty 1

## 2018-01-19 MED ORDER — SENNA 8.6 MG PO TABS
1.0000 | ORAL_TABLET | Freq: Two times a day (BID) | ORAL | Status: DC
  Administered 2018-01-19 – 2018-01-22 (×6): 8.6 mg via ORAL
  Filled 2018-01-19 (×6): qty 1

## 2018-01-19 MED ORDER — CEFAZOLIN SODIUM-DEXTROSE 2-4 GM/100ML-% IV SOLN
2.0000 g | INTRAVENOUS | Status: AC
  Administered 2018-01-19: 2 g via INTRAVENOUS

## 2018-01-19 MED ORDER — ACETAMINOPHEN 325 MG PO TABS
650.0000 mg | ORAL_TABLET | ORAL | Status: DC | PRN
  Administered 2018-01-19 – 2018-01-22 (×3): 650 mg via ORAL
  Filled 2018-01-19 (×4): qty 2

## 2018-01-19 MED ORDER — LIDOCAINE 2% (20 MG/ML) 5 ML SYRINGE
INTRAMUSCULAR | Status: DC | PRN
  Administered 2018-01-19: 60 mg via INTRAVENOUS

## 2018-01-19 MED ORDER — BISACODYL 10 MG RE SUPP: 10.0000 mg | Freq: Every day | RECTAL | Status: DC | PRN

## 2018-01-19 MED ORDER — DIPHENHYDRAMINE HCL 12.5 MG/5ML PO ELIX: 12.5000 mg | ORAL_SOLUTION | ORAL | Status: DC | PRN

## 2018-01-19 MED ORDER — PRAVASTATIN SODIUM 20 MG PO TABS
20.0000 mg | ORAL_TABLET | Freq: Every day | ORAL | Status: DC
  Administered 2018-01-19 – 2018-01-22 (×4): 20 mg via ORAL
  Filled 2018-01-19 (×4): qty 1

## 2018-01-19 MED ORDER — HYDROCODONE-ACETAMINOPHEN 10-325 MG PO TABS: 1.0000 | ORAL_TABLET | Freq: Four times a day (QID) | ORAL | 0 refills | Status: DC | PRN

## 2018-01-19 MED ORDER — MIDAZOLAM HCL 2 MG/2ML IJ SOLN
INTRAMUSCULAR | Status: AC
  Filled 2018-01-19: qty 2

## 2018-01-19 MED ORDER — ONDANSETRON HCL 4 MG/2ML IJ SOLN: 4.0000 mg | Freq: Once | INTRAMUSCULAR | Status: DC | PRN

## 2018-01-19 MED ORDER — MENTHOL 3 MG MT LOZG: 1.0000 | LOZENGE | OROMUCOSAL | Status: DC | PRN

## 2018-01-19 MED ORDER — ACETAMINOPHEN 650 MG RE SUPP: 650.0000 mg | RECTAL | Status: DC | PRN

## 2018-01-19 MED ORDER — ONDANSETRON HCL 4 MG/2ML IJ SOLN
INTRAMUSCULAR | Status: AC
  Filled 2018-01-19: qty 2

## 2018-01-19 MED ORDER — METFORMIN HCL 500 MG PO TABS
1000.0000 mg | ORAL_TABLET | Freq: Two times a day (BID) | ORAL | Status: DC
  Administered 2018-01-20 – 2018-01-22 (×6): 1000 mg via ORAL
  Filled 2018-01-19 (×6): qty 2

## 2018-01-19 MED ORDER — METHOCARBAMOL 1000 MG/10ML IJ SOLN: 500.0000 mg | Freq: Four times a day (QID) | INTRAMUSCULAR | Status: DC | PRN

## 2018-01-19 MED ORDER — POLYETHYLENE GLYCOL 3350 17 G PO PACK: 17.0000 g | PACK | Freq: Every day | ORAL | Status: DC | PRN

## 2018-01-19 MED ORDER — FENTANYL CITRATE (PF) 100 MCG/2ML IJ SOLN
INTRAMUSCULAR | Status: AC
  Administered 2018-01-19: 100 ug via INTRAVENOUS
  Filled 2018-01-19: qty 2

## 2018-01-19 MED ORDER — PROPOFOL 10 MG/ML IV BOLUS
INTRAVENOUS | Status: AC
  Filled 2018-01-19: qty 20

## 2018-01-19 MED ORDER — MAGNESIUM CITRATE PO SOLN: 1.0000 | Freq: Once | ORAL | Status: DC | PRN

## 2018-01-19 MED ORDER — CEFAZOLIN SODIUM-DEXTROSE 2-4 GM/100ML-% IV SOLN
INTRAVENOUS | Status: AC
  Filled 2018-01-19: qty 100

## 2018-01-19 MED ORDER — POTASSIUM CHLORIDE IN NACL 20-0.45 MEQ/L-% IV SOLN
INTRAVENOUS | Status: DC
  Administered 2018-01-19: 75 mL/h via INTRAVENOUS
  Filled 2018-01-19 (×2): qty 1000

## 2018-01-19 MED ORDER — VITAMIN B-12 1000 MCG PO TABS: 4000.0000 ug | ORAL_TABLET | Freq: Every day | ORAL | Status: DC

## 2018-01-19 MED ORDER — LACTATED RINGERS IV SOLN
INTRAVENOUS | Status: DC | PRN
  Administered 2018-01-19 (×2): via INTRAVENOUS

## 2018-01-19 MED ORDER — FENTANYL CITRATE (PF) 250 MCG/5ML IJ SOLN
INTRAMUSCULAR | Status: AC
  Filled 2018-01-19: qty 5

## 2018-01-19 MED ORDER — HYDROCODONE-ACETAMINOPHEN 10-325 MG PO TABS: 1.0000 | ORAL_TABLET | ORAL | Status: DC | PRN

## 2018-01-19 MED ORDER — ROPIVACAINE HCL 7.5 MG/ML IJ SOLN
INTRAMUSCULAR | Status: DC | PRN
  Administered 2018-01-19: 20 mL via PERINEURAL

## 2018-01-19 MED ORDER — ALUM & MAG HYDROXIDE-SIMETH 200-200-20 MG/5ML PO SUSP: 30.0000 mL | ORAL | Status: DC | PRN

## 2018-01-19 MED ORDER — ZOLPIDEM TARTRATE 5 MG PO TABS: 5.0000 mg | ORAL_TABLET | Freq: Every evening | ORAL | Status: DC | PRN

## 2018-01-19 MED ORDER — HYDROCODONE-ACETAMINOPHEN 10-325 MG PO TABS
2.0000 | ORAL_TABLET | ORAL | Status: DC | PRN
  Administered 2018-01-20 – 2018-01-22 (×12): 2 via ORAL
  Filled 2018-01-19 (×12): qty 2

## 2018-01-19 MED ORDER — FUROSEMIDE 40 MG PO TABS
40.0000 mg | ORAL_TABLET | Freq: Every day | ORAL | Status: DC
  Administered 2018-01-20 – 2018-01-22 (×3): 40 mg via ORAL
  Filled 2018-01-19 (×3): qty 1

## 2018-01-19 SURGICAL SUPPLY — 93 items
BANDAGE ACE 3X5.8 VEL STRL LF (GAUZE/BANDAGES/DRESSINGS) ×2 IMPLANT
BANDAGE ACE 4X5 VEL STRL LF (GAUZE/BANDAGES/DRESSINGS) ×2 IMPLANT
BENZOIN TINCTURE PRP APPL 2/3 (GAUZE/BANDAGES/DRESSINGS) ×2 IMPLANT
BIT DRILL 2.2 SS TIBIAL (BIT) ×2 IMPLANT
BIT DRILL 3.2 (BIT) ×1
BIT DRILL 3.2XCALB NS DISP (BIT) ×1 IMPLANT
BIT DRILL CALIBRATED 2.7 (BIT) ×2 IMPLANT
BIT DRL 3.2XCALB NS DISP (BIT) ×1
BLADE CLIPPER SURG (BLADE) IMPLANT
BNDG ESMARK 4X9 LF (GAUZE/BANDAGES/DRESSINGS) ×2 IMPLANT
CLEANER TIP ELECTROSURG 2X2 (MISCELLANEOUS) IMPLANT
CLOSURE STERI-STRIP 1/4X4 (GAUZE/BANDAGES/DRESSINGS) ×4 IMPLANT
CLSR STERI-STRIP ANTIMIC 1/2X4 (GAUZE/BANDAGES/DRESSINGS) ×2 IMPLANT
CORDS BIPOLAR (ELECTRODE) ×2 IMPLANT
COVER SURGICAL LIGHT HANDLE (MISCELLANEOUS) ×4 IMPLANT
CUFF TOURNIQUET SINGLE 18IN (TOURNIQUET CUFF) ×2 IMPLANT
CUFF TOURNIQUET SINGLE 24IN (TOURNIQUET CUFF) IMPLANT
DECANTER SPIKE VIAL GLASS SM (MISCELLANEOUS) IMPLANT
DRAPE C-ARM 42X72 X-RAY (DRAPES) ×2 IMPLANT
DRAPE IMP U-DRAPE 54X76 (DRAPES) ×2 IMPLANT
DRAPE INCISE IOBAN 66X45 STRL (DRAPES) ×2 IMPLANT
DRAPE OEC MINIVIEW 54X84 (DRAPES) IMPLANT
DRAPE U-SHAPE 47X51 STRL (DRAPES) ×2 IMPLANT
DRSG AQUACEL AG ADV 3.5X10 (GAUZE/BANDAGES/DRESSINGS) ×2 IMPLANT
DRSG MEPILEX BORDER 4X4 (GAUZE/BANDAGES/DRESSINGS) ×2 IMPLANT
DRSG MEPILEX BORDER 4X8 (GAUZE/BANDAGES/DRESSINGS) ×2 IMPLANT
DURAPREP 26ML APPLICATOR (WOUND CARE) ×2 IMPLANT
ELECT REM PT RETURN 9FT ADLT (ELECTROSURGICAL)
ELECTRODE REM PT RTRN 9FT ADLT (ELECTROSURGICAL) IMPLANT
GAUZE SPONGE 4X4 12PLY STRL (GAUZE/BANDAGES/DRESSINGS) ×2 IMPLANT
GAUZE XEROFORM 1X8 LF (GAUZE/BANDAGES/DRESSINGS) ×2 IMPLANT
GLOVE BIOGEL PI ORTHO PRO 7.5 (GLOVE) ×1
GLOVE BIOGEL PI ORTHO PRO SZ8 (GLOVE) ×1
GLOVE ORTHO TXT STRL SZ7.5 (GLOVE) ×4 IMPLANT
GLOVE PI ORTHO PRO STRL 7.5 (GLOVE) ×1 IMPLANT
GLOVE PI ORTHO PRO STRL SZ8 (GLOVE) ×1 IMPLANT
GLOVE SURG ORTHO 8.0 STRL STRW (GLOVE) ×4 IMPLANT
GOWN STRL REUS W/ TWL LRG LVL3 (GOWN DISPOSABLE) IMPLANT
GOWN STRL REUS W/TWL LRG LVL3 (GOWN DISPOSABLE)
K-WIRE 1.6 (WIRE) ×2
K-WIRE 2X5 SS THRDED S3 (WIRE) ×4
K-WIRE FX5X1.6XNS BN SS (WIRE) ×2
KIT BASIN OR (CUSTOM PROCEDURE TRAY) ×2 IMPLANT
KIT ROOM TURNOVER OR (KITS) ×2 IMPLANT
KWIRE 2X5 SS THRDED S3 (WIRE) ×2 IMPLANT
KWIRE FX5X1.6XNS BN SS (WIRE) ×2 IMPLANT
LOOP VESSEL MAXI BLUE (MISCELLANEOUS) IMPLANT
MANIFOLD NEPTUNE II (INSTRUMENTS) ×2 IMPLANT
NEEDLE 22X1 1/2 (OR ONLY) (NEEDLE) IMPLANT
NEEDLE HYPO 25GX1X1/2 BEV (NEEDLE) IMPLANT
NS IRRIG 1000ML POUR BTL (IV SOLUTION) ×4 IMPLANT
PACK ORTHO EXTREMITY (CUSTOM PROCEDURE TRAY) ×2 IMPLANT
PACK SHOULDER (CUSTOM PROCEDURE TRAY) ×2 IMPLANT
PACK UNIVERSAL I (CUSTOM PROCEDURE TRAY) ×2 IMPLANT
PAD ARMBOARD 7.5X6 YLW CONV (MISCELLANEOUS) ×4 IMPLANT
PAD CAST 4YDX4 CTTN HI CHSV (CAST SUPPLIES) ×1 IMPLANT
PADDING CAST COTTON 4X4 STRL (CAST SUPPLIES) ×1
PEG LOCKING 3.2X36 (Screw) ×2 IMPLANT
PEG LOCKING 3.2X38 (Screw) ×2 IMPLANT
PEG LOCKING 3.2X40 (Peg) ×4 IMPLANT
PEG LOCKING 3.2X42 (Screw) ×2 IMPLANT
PEG LOCKING 3.2X48 (Peg) ×2 IMPLANT
PEG LOCKING 3.2X52 (Peg) ×2 IMPLANT
PEG LOCKING SMOOTH 2.2X18 (Peg) ×14 IMPLANT
PLATE HUMERUS LP PROX L 3H (Plate) ×2 IMPLANT
PLATE STANDARD DVR LEFT (Plate) ×2 IMPLANT
PLATE STD DVR LT 24X51 (Plate) ×1 IMPLANT
SCREW LOCK 14X2.7X 3 LD TPR (Screw) ×1 IMPLANT
SCREW LOCK 16X2.7X 3 LD TPR (Screw) ×1 IMPLANT
SCREW LOCKING 2.7X14 (Screw) ×1 IMPLANT
SCREW LOCKING 2.7X15MM (Screw) ×2 IMPLANT
SCREW LOCKING 2.7X16 (Screw) ×1 IMPLANT
SCREW LOW PROFILE 3.5X30MM TIS (Screw) ×2 IMPLANT
SCREW LP NL T15 3.5X24 (Screw) ×2 IMPLANT
SCREW LP NL T15 3.5X26 (Screw) ×2 IMPLANT
SLEEVE MEASURING 3.2 (BIT) ×2 IMPLANT
SPONGE LAP 4X18 X RAY DECT (DISPOSABLE) IMPLANT
STAPLER VISISTAT 35W (STAPLE) IMPLANT
SUCTION FRAZIER HANDLE 10FR (MISCELLANEOUS) ×1
SUCTION TUBE FRAZIER 10FR DISP (MISCELLANEOUS) ×1 IMPLANT
SUPPORT WRAP ARM LG (MISCELLANEOUS) ×2 IMPLANT
SUT FIBERWIRE #2 38 T-5 BLUE (SUTURE)
SUT VIC AB 0 CTB1 27 (SUTURE) IMPLANT
SUT VIC AB 2-0 CT1 27 (SUTURE) ×1
SUT VIC AB 2-0 CT1 TAPERPNT 27 (SUTURE) ×1 IMPLANT
SUT VIC AB 3-0 FS2 27 (SUTURE) ×2 IMPLANT
SUTURE FIBERWR #2 38 T-5 BLUE (SUTURE) IMPLANT
SYR BULB IRRIGATION 50ML (SYRINGE) ×2 IMPLANT
SYR CONTROL 10ML LL (SYRINGE) IMPLANT
TOWEL OR 17X24 6PK STRL BLUE (TOWEL DISPOSABLE) ×2 IMPLANT
TOWEL OR 17X26 10 PK STRL BLUE (TOWEL DISPOSABLE) ×2 IMPLANT
TUBE CONNECTING 12X1/4 (SUCTIONS) ×2 IMPLANT
WATER STERILE IRR 1000ML POUR (IV SOLUTION) ×2 IMPLANT

## 2018-01-19 NOTE — Anesthesia Procedure Notes (Signed)
Anesthesia Regional Block: Interscalene brachial plexus block   Pre-Anesthetic Checklist: ,, timeout performed, Correct Patient, Correct Site, Correct Laterality, Correct Procedure,, site marked, risks and benefits discussed, Surgical consent,  Pre-op evaluation,  At surgeon's request and post-op pain management  Laterality: Left  Prep: chloraprep       Needles:  Injection technique: Single-shot  Needle Type: Echogenic Stimulator Needle     Needle Length: 9cm  Needle Gauge: 21     Additional Needles:   Procedures:,,,, ultrasound used (permanent image in chart),,,,  Narrative:  Start time: 01/19/2018 1:50 PM End time: 01/19/2018 2:00 PM Injection made incrementally with aspirations every 5 mL.  Performed by: Personally  Anesthesiologist: Murvin Natal, MD  Additional Notes: Functioning IV was confirmed and monitors were applied.  A 52mm 21ga Arrow echogenic stimulator needle was used. Sterile prep, hand hygiene and sterile gloves were used.  Negative aspiration and negative test dose prior to incremental administration of local anesthetic. The patient tolerated the procedure well.

## 2018-01-19 NOTE — Anesthesia Postprocedure Evaluation (Signed)
Anesthesia Post Note  Patient: ITHIEL LIEBLER  Procedure(s) Performed: OPEN REDUCTION INTERNAL FIXATION (ORIF) PROXIMAL HUMERUS FRACTURE (Left ) OPEN REDUCTION INTERNAL FIXATION (ORIF) DISTAL RADIAL FRACTURE (Left )     Patient location during evaluation: PACU Anesthesia Type: Regional and General Level of consciousness: awake Pain management: pain level controlled Vital Signs Assessment: post-procedure vital signs reviewed and stable Respiratory status: spontaneous breathing Cardiovascular status: stable Anesthetic complications: no    Last Vitals:  Vitals:   01/19/18 1940 01/19/18 1955  BP: (!) 150/92 (!) 157/89  Pulse: 95 96  Resp: 20 20  Temp:    SpO2: 93% 93%    Last Pain:  Vitals:   01/19/18 1855  TempSrc:   PainSc: Asleep                 Aariv Medlock

## 2018-01-19 NOTE — Transfer of Care (Addendum)
Immediate Anesthesia Transfer of Care Note  Patient: Casey Gilmore  Procedure(s) Performed: OPEN REDUCTION INTERNAL FIXATION (ORIF) PROXIMAL HUMERUS FRACTURE (Left ) OPEN REDUCTION INTERNAL FIXATION (ORIF) DISTAL RADIAL FRACTURE (Left )  Patient Location: PACU  Anesthesia Type:General/regional   Level of Consciousness: drowsy, patient cooperative and responds to stimulation  Airway & Oxygen Therapy: Patient Spontanous Breathing and Patient connected to nasal cannula oxygen  Post-op Assessment: Report given to RN and Post -op Vital signs reviewed and stable  Post vital signs: Reviewed and stable  Last Vitals:  Vitals:   01/19/18 1408 01/19/18 1855  BP: (!) 166/69 137/85  Pulse: 85 95  Resp:  (!) 25  Temp:  37.1 C  SpO2: 97% 98%    Last Pain:  Vitals:   01/19/18 1855  TempSrc:   PainSc: (P) Asleep      Patients Stated Pain Goal: 3 (58/83/25 4982)  Complications: No apparent anesthesia complications

## 2018-01-19 NOTE — H&P (Signed)
PREOPERATIVE H&P  Chief Complaint: LEFT PROXIMAL HUMERUS FRACTURE AND LEFT DISTAL RADIUS  FRACTURE  HPI: Casey Gilmore is a 77 y.o. male who presents for preoperative history and physical with a diagnosis of LEFT PROXIMAL HUMERUS FRACTURE AND LEFT DISTAL RADIUS  FRACTURE. Symptoms are rated as moderate to severe, and have been worsening.  This is significantly impairing activities of daily living.  He has elected for surgical management.   His injury occurred February 9 after a mechanical fall.  He has had significant swelling and pain in his wrist as well as his shoulder.  There was apparently an attempted closed reduction in the emergency room, although the position of his wrist is still unacceptable.  Pain is worse with movement, better with rest.  Past Medical History:  Diagnosis Date  . Anemia    2012  . Arthritis   . Blood transfusion   . 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 COPD (chronic obstructive pulmonary disease) ([place])   . Diabetes mellitus   . GERD (gastroesophageal reflux disease)   . Heart murmur   . Hiatal hernia   . Hyperlipidemia   . Hypertension   . NSTEMI (non-ST elevated myocardial infarction) ([place]) 07/2011   in setting of Pneumonia with normal coronary arteries on cath  . Pneumonia hx 8/12  . Severe aortic stenosis 2012   s/p bioprosthetic AVR  . Shortness of breath    with exertion  . Upper GI bleeding    Past Surgical History:  Procedure Laterality Date  . AORTIC VALVE REPLACEMENT  10/15/2011   Procedure: AORTIC VALVE REPLACEMENT (AVR);  Surgeon: Tharon Aquas Adelene Idler, MD;  Location: [facility];  Service: Open Heart Surgery;  Laterality: N/A;  . CARDIAC CATHETERIZATION  results on chart   2012 w no significant disease,echo with Severe Aortic stenosis,LVH,Diastolic dysfunction.   . COLONOSCOPY  12/2004  . EYE SURGERY  bil cat 08  . FRACTURE SURGERY  pin in lft hip  . JOINT REPLACEMENT  left knee 08, rt hip 08  . LARYNGOSCOPY  01/05/2013   Procedure: LARYNGOSCOPY;   Surgeon: Melida Quitter, MD;  Location: [facility];  Service: ENT;  Laterality: N/A;  micro direct laryngoscopy with biopsy, possible co2 laser, small laser-safe endotracheal tube  . LARYNGOSCOPY N/A 02/08/2013   Procedure: LARYNGOSCOPY;  Surgeon: Melida Quitter, MD;  Location: [facility];  Service: ENT;  Laterality: N/A;  Micro Direct Laryngoscopy with Co2 laser with re-excision right vocal cord lesion.  . TONSILLECTOMY    . TOTAL HIP ARTHROPLASTY Right   . TOTAL KNEE ARTHROPLASTY Left   . 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 Marital status: Single    Spouse name: None  . Number of children: None  . Years of education: None  . Highest education level: None  Social Needs  . Financial resource strain: None  . Food insecurity - worry: None  . Food insecurity - inability: None  . Transportation needs - medical: None  . Transportation needs - non-medical: None  Occupational History  . None  Tobacco Use  . Smoking status: Former Smoker    Packs/day: 2.00    Years: 25.00    Pack years: 50.00    Types: Cigarettes    Last attempt to quit: 12/06/1978    Years since quitting: 39.1  . Smokeless tobacco: Never Used  Substance and Sexual Activity  . Alcohol use:  No  . Drug use: No  . Sexual activity: None  Other Topics Concern  . None  Social History Narrative  . None   History reviewed. No pertinent family history. No Known Allergies Prior to Admission medications   Medication Sig Start Date End Date Taking? Authorizing Provider  aspirin EC 81 MG tablet Take 81 mg by mouth daily.   Yes [provider]  cyanocobalamin 2000 MCG tablet Take 4,000 mcg by mouth daily.    Yes [provider]  furosemide (LASIX) 40 MG tablet Take 40 mg by mouth daily.    Yes [provider]  glimepiride (AMARYL) 4 MG tablet Take 4 mg by mouth daily before breakfast.    Yes [provider]  HYDROcodone-acetaminophen (NORCO/VICODIN) 5-325 MG tablet Take 2 tablets by mouth every 4 (four) hours as needed. 01/15/18  Yes Montine Circle, PA-C  metFORMIN (GLUCOPHAGE) 1000 MG tablet Take 1,000 mg by mouth 2 (two) times daily with a meal.   Yes [provider]  omeprazole (PRILOSEC) 40 MG capsule Take 40 mg by mouth daily.   Yes [provider]  potassium chloride SA (K-DUR,KLOR-CON) 20 MEQ tablet Take 20 mEq by mouth daily.     Yes [provider]  pravastatin (PRAVACHOL) 20 MG tablet Take 20 mg by mouth daily. 11/08/17  Yes [provider]  PRESCRIPTION MEDICATION Inject 40 Units into the skin daily. Insulin Doctor is giving him samples of   Yes [provider]     Positive ROS: All other systems have been reviewed and were otherwise negative with the exception of those mentioned in the HPI and as above.  Physical Exam: General: Alert, no acute distress Cardiovascular: No pedal edema Respiratory: No cyanosis, no use of accessory musculature GI: No organomegaly, abdomen is soft and non-tender Skin: No lesions in the area of chief complaint except for significant bruising around the shoulder and wrist and hand Neurologic: Sensation intact distally Psychiatric: Patient is competent for consent with normal mood and affect Lymphatic: No axillary or cervical lymphadenopathy  MUSCULOSKELETAL: Left hand has sensation intact throughout the fingers, although he is very reluctant to move them.  His shoulder has significant pain to palpation as well as ecchymosis and soft tissue swelling.  Assessment: LEFT PROXIMAL HUMERUS FRACTURE AND LEFT DISTAL RADIUS  FRACTURE   Plan: Plan for Procedure(s): OPEN REDUCTION INTERNAL FIXATION (ORIF) PROXIMAL HUMERUS FRACTURE VS REVERSE TOTAL SHOULDER REPLACEMENT   OPEN REDUCTION INTERNAL FIXATION (ORIF) DISTAL RADIAL FRACTURE  The risks benefits and alternatives were discussed with the patient  including but not limited to the risks of nonoperative treatment, versus surgical intervention including infection, bleeding, nerve injury, malunion, nonunion, the need for revision surgery, hardware prominence, hardware failure, the need for hardware removal, blood clots, cardiopulmonary complications, morbidity, mortality, among others, and they were willing to proceed.     Severity of Illness: The appropriate patient status for this patient is INPATIENT. Inpatient status is judged to be reasonable and necessary in order to provide the required intensity of service to ensure the patient's safety. The patient's presenting symptoms, physical exam findings, and initial radiographic and laboratory data in the context of their chronic comorbidities is felt to place them at high risk for further clinical deterioration. Furthermore, it is not anticipated that the patient will be medically stable for discharge from the hospital within 2 midnights of admission. The following factors support the patient status of inpatient.   " The patient's presenting symptoms include severe left  upper extremity pain and loss of function. " The worrisome physical exam findings include significant soft tissue swelling throughout the hand and upper extremity. " The initial radiographic and laboratory data are worrisome because of displaced left wrist and shoulder fracture. " The chronic co-morbidities include  Past Medical History:  Diagnosis Date  . Anemia    2012  . Arthritis   . Blood transfusion   . 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 COPD (chronic obstructive pulmonary disease) ([place])   . Diabetes mellitus   . GERD (gastroesophageal reflux disease)   . Heart murmur   . Hiatal hernia   . Hyperlipidemia   . Hypertension   . NSTEMI (non-ST elevated myocardial infarction) ([place]) 07/2011   in setting of Pneumonia with normal coronary arteries on cath  . Pneumonia hx 8/12  . Severe aortic stenosis 2012   s/p bioprosthetic  AVR  . Shortness of breath    with exertion  . Upper GI bleeding    .   * I certify that at the point of admission it is my clinical judgment that the patient will require inpatient hospital care spanning beyond 2 midnights from the point of admission due to high intensity of service, high risk for further deterioration and high frequency of surveillance required.Johnny Bridge, MD Cell [phone]   01/19/2018 1:59 PM

## 2018-01-19 NOTE — Anesthesia Procedure Notes (Signed)
Procedure Name: Intubation Date/Time: 01/19/2018 2:56 PM Performed by: Freddie Breech, CRNA Pre-anesthesia Checklist: Patient identified, Emergency Drugs available, Suction available and Patient being monitored Patient Re-evaluated:Patient Re-evaluated prior to induction Oxygen Delivery Method: Circle System Utilized Preoxygenation: Pre-oxygenation with 100% oxygen Induction Type: IV induction Ventilation: Mask ventilation without difficulty Laryngoscope Size: Mac and 4 Grade View: Grade I Tube type: Oral Number of attempts: 1 Airway Equipment and Method: Stylet and Oral airway Placement Confirmation: ETT inserted through vocal cords under direct vision,  positive ETCO2 and breath sounds checked- equal and bilateral Secured at: 22 cm Tube secured with: Tape Dental Injury: Teeth and Oropharynx as per pre-operative assessment

## 2018-01-19 NOTE — Op Note (Signed)
01/19/2018  4:15 PM  PATIENT:  Casey Gilmore    PRE-OPERATIVE DIAGNOSIS:  LEFT PROXIMAL HUMERUS FRACTURE 4 part AND LEFT DISTAL RADIUS  FRACTURE, 3 pieces  POST-OPERATIVE DIAGNOSIS:  Same  PROCEDURE:    ORIF DISTAL RADIUS FRACTURE, 3 PIECES ORIF PROXIMAL HUMERUS FRACTURE  SURGEON:  Johnny Bridge, MD  PHYSICIAN ASSISTANT: Joya Gaskins, OPA-C, present and scrubbed throughout the case, critical for completion in a timely fashion, and for retraction, instrumentation, and closure.  ANESTHESIA:   General  ESTIMATED BLOOD LOSS: 200 ml  PREOPERATIVE INDICATIONS:  Casey Gilmore is a  77 y.o. male with a diagnosis of LEFT PROXIMAL HUMERUS FRACTURE AND LEFT DISTAL RADIUS  FRACTURE who elected for surgical management due to fracture displacement.    The risks benefits and alternatives were discussed with the patient preoperatively including but not limited to the risks of infection, bleeding, nerve injury, cardiopulmonary complications, the need for revision surgery, tendon rupture, hardware prominence, hardware failure, nonunion, malunion, post-traumatic arthritis, regional pain syndrome, the potential need for reverse shoulder replacement, among others, and the patient was willing to proceed.  OPERATIVE IMPLANTS:  1.  Biomet DVR volar plate standard width with 3 proximal cortical screws and multiple distal interlocking smooth pegs 2.  Biomet ALPS proximal humerus locking plate.  OPERATIVE FINDINGS: Comminution of the distal radius fracture at the dorsal shaft, the distal block was one major piece.  UNIQUE ASPECTS OF THE CASE: Getting the plate to line up correctly was somewhat challenging, but ultimately I was able to secure appropriate position.  There was significant displacement at the fracture site, and the pronator quadratus was in very poor condition.  The distal most cortical screw was only unicortical, as the dorsal cortex was not present.  The proximal 2 cortical screws were  bicortical.  The distal ulnar proximal smooth peg was just adjacent to the DRUJ, but not necessary for fixation of the block, so I removed that peg.  The head of the plate was slightly ulnar then I had intended, but overall able to restore anatomic alignment and so I did not change this.  OPERATIVE PROCEDURE: The patient was brought to the operating room and placed in the supine position. General anesthesia was administered. IV antibiotics were given. He also received nasal betadyne preoperatively  Time out was performed. The upper extremity was prepped and draped in usual sterile fashion. The arm was elevated and exsanguinated and the tourniquet was inflated at 275mm hg.    Volar approach to the distal radius was carried out, and the flexor carpi radialis was retracted radially. The radial artery was protected throughout the case.   Deep dissection was carried down, and the pronator quadratus was elevated off of the radius. The fracture site was identified and cleaned and reduced anatomically. This keyed into place nicely.   I held this provisionally with a K wire, and C-arm used to confirm alignment.   I had restored height and inclination and then applied a volar plate. A K wire was used to confirm appropriate position of the plate, and once I was satisfied with the overall alignment I was able to secure the plate proximally with a cortical screw.   I then secured the fracture with multiple smooth interlocking pegs distally, and confirmed that none of these were in the joint, and none of these were penetrating the dorsal cortex. I also secured the plate proximally with two more cortical screw. On the ulnar side of the plate distally I used  shorter pegs than on the radial side.    The wounds were irrigated copiously, and I used 3-0 subcutaneous Vicryl for the skin and Steri-Strips and sterile gauze.  We then proceeded with a separate prep and drape for the proximal humerus.    UNIQUE ASPECTS OF  THE CASE: There was a separate posterior fragmented piece of the tuberosity, which I was able to get with a third FiberWire and get adequately reduced.  The entire unit moved very smoothly at the completion of the case, I did not quite get the articular surface completely reduced to the medial calcar, but did correct the valgus and had stable fixation.  OPERATIVE PROCEDURE: The patient was in the operating room and he was repositioned in the beach chair position. All bony prominences were padded. The upper extremity was prepped and draped in usual sterile fashion. Deltopectoral incision was performed.  I exposed the fracture site, and placed deep retractors. I did not tenotomize the biceps tendon. This was left in place. I placed supraspinatus and subscapularis stitches, a total of 3 #2 fiberwire, and then reduced the head onto the shaft as well as possible. This was maintained in satisfactory position.  I applied the plate and secured it into the sliding hole first. I confirmed position of the reduction and the plate with C-arm, and I placed a total of 2 guidewires into the appropriate position in the head. I was satisfied that the plate was distal appropriately, and then secured the plate proximally with smooth pegs, taking care to prevent penetration into the arch articular surface, using C-arm, as well as manual feel using a hand drill.  I then secured the plate distally using another cortical screw. I then passed the FiberWire sutures from the subscapularis and supraspinatus through the plate and secured the tuberosities. Once complete fixation and reduction of been achieved, took final C-arm pictures, and irrigated the wounds copiously, and repaired the deltopectoral interval with Vicryl followed by Vicryl for the subcutaneous tissue with Monocryl and Steri-Strips for the skin. He was placed in a sling. He had a preoperative regional block as well.   We then applied a volar splint to the distal  radius. He was awakened and returned back in stable and satisfactory condition. There were no complications and He tolerated the procedure well.

## 2018-01-19 NOTE — Discharge Instructions (Signed)
Diet: As you were doing prior to hospitalization   Shower:  May shower but keep the wounds dry, use an occlusive plastic wrap, NO SOAKING IN TUB.  If the bandage gets wet, change with a clean dry gauze.  If you have a splint on, leave the splint in place and keep the splint dry with a plastic bag.  Dressing:  You may change your dressing 3-5 days after surgery, unless you have a splint.  If you have a splint, then just leave the splint in place and we will change your bandages during your first follow-up appointment.    If you had hand or foot surgery, we will plan to remove your stitches in about 2 weeks in the office.  For all other surgeries, there are sticky tapes (steri-strips) on your wounds and all the stitches are absorbable.  Leave the steri-strips in place when changing your dressings, they will peel off with time, usually 2-3 weeks.  Activity:  Increase activity slowly as tolerated, but follow the weight bearing instructions below.  The rules on driving is that you can not be taking narcotics while you drive, and you must feel in control of the vehicle.    Weight Bearing:   Sling at all times, ok to work fingers and elbow to tolerance, but no rotation of the arm or lifting of the arm away from the body.    To prevent constipation: you may use a stool softener such as -  Colace (over the counter) 100 mg by mouth twice a day  Drink plenty of fluids (prune juice may be helpful) and high fiber foods Miralax (over the counter) for constipation as needed.    Itching:  If you experience itching with your medications, try taking only a single pain pill, or even half a pain pill at a time.  You may take up to 10 pain pills per day, and you can also use benadryl over the counter for itching or also to help with sleep.   Precautions:  If you experience chest pain or shortness of breath - call 911 immediately for transfer to the hospital emergency department!!  If you develop a fever greater  that 101 F, purulent drainage from wound, increased redness or drainage from wound, or calf pain -- Call the office at [phone]                                                Follow- Up Appointment:  Please call for an appointment to be seen in 2 weeks [place] - [phone]

## 2018-01-19 NOTE — Anesthesia Preprocedure Evaluation (Addendum)
Anesthesia Evaluation  Patient identified by MRN, date of birth, ID band Patient awake    Reviewed: Allergy & Precautions, NPO status , Patient's Chart, lab work & pertinent test results  Airway Mallampati: II  TM Distance: >3 FB Neck ROM: Full    Dental  (+) Lower Dentures, Upper Dentures   Pulmonary COPD (mild, controlled), former smoker,    Pulmonary exam normal breath sounds clear to auscultation       Cardiovascular hypertension, + CAD and + Past MI  Normal cardiovascular exam+ Valvular Problems/Murmurs (s/p AVR in 2012) AS  Rhythm:Regular Rate:Normal  ECG: NSR, rate 80  Sees cardiologist   Neuro/Psych negative neurological ROS  negative psych ROS   GI/Hepatic Neg liver ROS, hiatal hernia, GERD  Medicated and Controlled,  Endo/Other  diabetes, Poorly Controlled, Oral Hypoglycemic Agents, Insulin Dependent  Renal/GU negative Renal ROS     Musculoskeletal Sensation intact to left hand   Abdominal   Peds  Hematology  (+) anemia , HLD   Anesthesia Other Findings LEFT PROXIMAL HUMERUS FRACTURE AND LEFT DISTAL RADIUS  FRACTURE  Reproductive/Obstetrics                           Anesthesia Physical Anesthesia Plan  ASA: III  Anesthesia Plan: General and Regional   Post-op Pain Management: GA combined w/ Regional for post-op pain   Induction: Intravenous  PONV Risk Score and Plan: 2 and Dexamethasone, Ondansetron and Treatment may vary due to age or medical condition  Airway Management Planned: Oral ETT  Additional Equipment:   Intra-op Plan:   Post-operative Plan: Extubation in OR  Informed Consent: I have reviewed the patients History and Physical, chart, labs and discussed the procedure including the risks, benefits and alternatives for the proposed anesthesia with the patient or authorized representative who has indicated his/her understanding and acceptance.   Dental advisory  given  Plan Discussed with: CRNA  Anesthesia Plan Comments:        Anesthesia Quick Evaluation

## 2018-01-20 ENCOUNTER — Encounter (HOSPITAL_COMMUNITY): Payer: Self-pay | Admitting: Orthopedic Surgery

## 2018-01-20 LAB — CBC
HCT: 24.5 % — ABNORMAL LOW (ref 39.0–52.0)
HEMOGLOBIN: 7.3 g/dL — AB (ref 13.0–17.0)
MCH: 23.2 pg — AB (ref 26.0–34.0)
MCHC: 29.8 g/dL — ABNORMAL LOW (ref 30.0–36.0)
MCV: 78 fL (ref 78.0–100.0)
PLATELETS: 256 10*3/uL (ref 150–400)
RBC: 3.14 MIL/uL — AB (ref 4.22–5.81)
RDW: 15.6 % — ABNORMAL HIGH (ref 11.5–15.5)
WBC: 8.7 10*3/uL (ref 4.0–10.5)

## 2018-01-20 LAB — BASIC METABOLIC PANEL
ANION GAP: 10 (ref 5–15)
BUN: 20 mg/dL (ref 6–20)
CO2: 23 mmol/L (ref 22–32)
Calcium: 8 mg/dL — ABNORMAL LOW (ref 8.9–10.3)
Chloride: 101 mmol/L (ref 101–111)
Creatinine, Ser: 1.16 mg/dL (ref 0.61–1.24)
GFR calc Af Amer: >60 mL/min (ref >60)
GFR, EST NON AFRICAN AMERICAN: 59 mL/min — AB (ref >60)
Glucose, Bld: 198 mg/dL — ABNORMAL HIGH (ref 65–99)
POTASSIUM: 5.4 mmol/L — AB (ref 3.5–5.1)
SODIUM: 134 mmol/L — AB (ref 135–145)

## 2018-01-20 LAB — GLUCOSE, CAPILLARY
GLUCOSE-CAPILLARY: 217 mg/dL — AB (ref 65–99)
GLUCOSE-CAPILLARY: 96 mg/dL (ref 65–99)
GLUCOSE-CAPILLARY: 139 mg/dL — AB (ref 65–99)
Glucose-Capillary: 141 mg/dL — ABNORMAL HIGH (ref 65–99)

## 2018-01-20 LAB — PREPARE RBC (CROSSMATCH)

## 2018-01-20 MED ORDER — SODIUM CHLORIDE 0.9 % IV SOLN
Freq: Once | INTRAVENOUS | Status: AC
  Administered 2018-01-20: 14:00:00 via INTRAVENOUS

## 2018-01-20 MED ORDER — ACETAMINOPHEN 325 MG PO TABS
650.0000 mg | ORAL_TABLET | Freq: Once | ORAL | Status: AC
  Administered 2018-01-20: 650 mg via ORAL
  Filled 2018-01-20: qty 2

## 2018-01-20 MED ORDER — SODIUM CHLORIDE 0.45 % IV SOLN
INTRAVENOUS | Status: DC
  Administered 2018-01-21 – 2018-01-22 (×3): via INTRAVENOUS

## 2018-01-20 MED ORDER — DIPHENHYDRAMINE HCL 25 MG PO CAPS
25.0000 mg | ORAL_CAPSULE | Freq: Once | ORAL | Status: AC
  Administered 2018-01-20: 25 mg via ORAL
  Filled 2018-01-20: qty 1

## 2018-01-20 NOTE — Evaluation (Signed)
Occupational Therapy Evaluation Patient Details Name: Casey Gilmore MRN: [id number] DOB: June 07, 1941 Today's Date: 01/20/2018    History of Present Illness 77 y.o. male s/p ORIF proximal humeral fx and distal radial fx 2/14. PMH includes: L hip fusion, HTN, COPD, L TKA, R THA, aortic valve replacement.    Clinical Impression   Pt admitted with the above diagnoses and presents with below problem list. Pt will benefit from continued acute OT to address the below listed deficits and maximize independence with basic ADLs prior to d/c to venue below. PTA pt reports he was independent with ADLs. Pt is currently close min guard to min A with functional transfers and in-room mobility, mod A for UB/LB ADLs. Discussed ST SNF for rehab since pt would have no assist during the day and would be at higher risk for falls given his co-morbidities. Pt indicated that he would think about it.      Follow Up Recommendations  SNF;Supervision/Assistance - 24 hour    Equipment Recommendations  Other (comment)(defer to next venue (likely to need 3n1))    Recommendations for Other Services       Precautions / Restrictions Precautions Precautions: Fall Precaution Comments: prior hip fusion, NWB LUE Required Braces or Orthoses: Sling Restrictions Weight Bearing Restrictions: Yes LUE Weight Bearing: Non weight bearing      Mobility Bed Mobility Overal bed mobility: Needs Assistance Bed Mobility: Supine to Sit     Supine to sit: Min assist     General bed mobility comments: Min A to support trunk and stabilize as he powers up.   Transfers Overall transfer level: Needs assistance Equipment used: Straight cane;1 person hand held assist Transfers: Sit to/from Stand Sit to Stand: Min assist         General transfer comment: Min A to power up and stabilize, patient able to stand without AD at this time.     Balance Overall balance assessment: Needs assistance Sitting-balance support: Feet  supported Sitting balance-Leahy Scale: Fair     Standing balance support: During functional activity;Single extremity supported Standing balance-Leahy Scale: Fair                             ADL either performed or assessed with clinical judgement   ADL Overall ADL's : Needs assistance/impaired Eating/Feeding: Set up;Sitting   Grooming: Minimal assistance;Sitting   Upper Body Bathing: Moderate assistance;Sitting   Lower Body Bathing: Moderate assistance;Sit to/from stand   Upper Body Dressing : Moderate assistance;Sitting   Lower Body Dressing: Moderate assistance;Sit to/from stand   Toilet Transfer: Minimal assistance;Ambulation(cane)   Toileting- Clothing Manipulation and Hygiene: Minimal assistance;Sit to/from stand   Tub/ Shower Transfer: Walk-in shower;Minimal assistance;Ambulation(cane)   Functional mobility during ADLs: Min guard;Cane General ADL Comments: Pt completed bed mobility, in-room functional mobility, and transferred to recliner as detailed above. Began shoulder education.      Vision         Perception     Praxis      Pertinent Vitals/Pain Pain Assessment: Faces Faces Pain Scale: Hurts whole lot Pain Location: L arm Pain Descriptors / Indicators: Discomfort;Grimacing;Operative site guarding Pain Intervention(s): Limited activity within patient's tolerance;Monitored during session;Repositioned;Patient requesting pain meds-RN notified     Hand Dominance Right   Extremity/Trunk Assessment Upper Extremity Assessment Upper Extremity Assessment: RUE deficits/detail RUE Deficits / Details: s/p ORIF proximal humeral fx and distal radial fx 2/14. Elbow ROM to be assessed next session (pain).  RUE: Unable  to fully assess due to immobilization;Unable to fully assess due to pain   Lower Extremity Assessment Lower Extremity Assessment: Defer to PT evaluation       Communication Communication Communication: No difficulties   Cognition  Arousal/Alertness: Awake/alert Behavior During Therapy: WFL for tasks assessed/performed Overall Cognitive Status: Within Functional Limits for tasks assessed                                     General Comments  Discussed benefits of SNF rehab prior to return to home, safety considerations for home    Exercises     Shoulder Instructions      Home Living Family/patient expects to be discharged to:: Private residence Living Arrangements: Alone Available Help at Discharge: Family;Available PRN/intermittently Type of Home: House Home Access: Stairs to enter [employer] of Steps: 3 Entrance Stairs-Rails: Left;Right Home Layout: One level     Bathroom Shower/Tub: Occupational psychologist: Handicapped height     Home Equipment: Cane - single point          Prior Functioning/Environment Level of Independence: Independent                 OT Problem List: Impaired balance (sitting and/or standing);Decreased knowledge of use of DME or AE;Decreased knowledge of precautions;Impaired UE functional use;Pain      OT Treatment/Interventions: Self-care/ADL training;Therapeutic exercise;DME and/or AE instruction;Therapeutic activities;Patient/family education;Balance training    OT Goals(Current goals can be found in the care plan section) Acute Rehab OT Goals Patient Stated Goal: open to rehab first before return home OT Goal Formulation: With patient Time For Goal Achievement: 01/27/18 Potential to Achieve Goals: Good ADL Goals Pt Will Perform Upper Body Bathing: with supervision;sitting Pt Will Perform Lower Body Bathing: with supervision;sit to/from stand Pt Will Perform Upper Body Dressing: with supervision;sitting Pt Will Perform Lower Body Dressing: with supervision;sit to/from stand Pt Will Transfer to Toilet: with supervision;ambulating Pt Will Perform Toileting - Clothing Manipulation and hygiene: with supervision;sit to/from  stand Pt Will Perform Tub/Shower Transfer: with supervision;ambulating;3 in 1 Additional ADL Goal #1: Pt will be independent with R e/w/h ROM exercises.  OT Frequency: Min 3X/week   Barriers to D/C: Decreased caregiver support          Co-evaluation PT/OT/SLP Co-Evaluation/Treatment: Yes Reason for Co-Treatment: For patient/therapist safety;To address functional/ADL transfers;Complexity of the patient's impairments (multi-system involvement) PT goals addressed during session: Mobility/safety with mobility;Balance;Proper use of DME;Strengthening/ROM OT goals addressed during session: ADL's and self-care      AM-PAC PT "6 Clicks" Daily Activity     Outcome Measure Help from another person eating meals?: None Help from another person taking care of personal grooming?: A Little Help from another person toileting, which includes using toliet, bedpan, or urinal?: A Little Help from another person bathing (including washing, rinsing, drying)?: A Lot Help from another person to put on and taking off regular upper body clothing?: A Lot   6 Click Score: 14   End of Session Equipment Utilized During Treatment: Other (comment)(sling, cane) Nurse Communication: Patient requests pain meds  Activity Tolerance: Patient limited by pain;Patient tolerated treatment well Patient left: in chair;with call bell/phone within reach  OT Visit Diagnosis: Unsteadiness on feet (R26.81);Pain Pain - Right/Left: Left Pain - part of body: Shoulder                Time: 6270-3500 OT Time Calculation (min): 36 min  Charges:  OT General Charges $OT Visit: 1 Visit OT Evaluation $OT Eval Moderate Complexity: 1 Mod G-Codes:       Hortencia Pilar 01/20/2018, 11:07 AM

## 2018-01-20 NOTE — Social Work (Signed)
CSW went to meet with patient for assessment for SNF, however patient in pain and indicated that he was not in the mood to meet given his discomfort.   CSW will f/u.  Elissa Hefty, LCSW Clinical Social Worker [phone]

## 2018-01-20 NOTE — Care Management Note (Signed)
Case Management Note  Patient Details  Name: Casey Gilmore MRN: [id number] Date of Birth: January 14, 1941  Subjective/Objective:   77 yr old gentleman s/p ORIF left humerus fracture, ORIF  Left distal radial fracture.                Action/Plan: Case manager spoke with patient and his son Marden Noble concerning discharge plan and DME. Patient does not want to go to SNF, wishes to go home with Home Health. CM offered Choice for [place] was offered, referral was called to Neoma Laming, [place] Liaison. Patient's son asked that he be called - Candon Caras, [phone] to arrange Home Health visits.    Expected Discharge Date:    01/21/18              Expected Discharge Plan:  [place]  In-House Referral:  NA  Discharge planning Services  CM Consult  Post Acute Care Choice:  Durable Medical Equipment, Home Health Choice offered to:  Patient, Adult Children  DME Arranged:  3-N-1, Walker hemi DME Agency:  [facility] Arranged:  PT, OT [place] Agency:  [facility]  Status of Service:  Completed, signed off  If discussed at [place] of Stay Meetings, dates discussed:    Additional Comments:  Ninfa Meeker, RN 01/20/2018, 1:32 PM

## 2018-01-20 NOTE — Progress Notes (Signed)
Subjective:  Patient reports pain as mild.  Overall doing ok without complaints.  Objective:   VITALS:   Vitals:   01/19/18 1955 01/19/18 2015 01/20/18 0008 01/20/18 0446  BP: (!) 157/89 (!) 145/69 (!) 102/55 125/63  Pulse: 96 89 66   Resp: 20 20    Temp: 97.6 F (36.4 C) 98.6 F (37 C) (!) 96.3 F (35.7 C) 98.2 F (36.8 C)  TempSrc:  Oral Axillary Oral  SpO2: 93% 100% 97% 95%  Weight:       Sensation seems to be intact throughout the hand as well as over the deltoid, although he does not actively want to move his hand very much, this is fairly similar to his preoperative exam, he does have a flicker of motion in all of his fingers, but is reluctant to move them.  I can straighten them out passively which does cause some pain up in the wrist, I do not suspect compartment syndrome given his injury, however I have encouraged him to work on moving the fingers and hand.  He has good capillary refill.   Lab Results  Component Value Date   WBC 9.1 01/19/2018   HGB 8.4 (L) 01/19/2018   HCT 28.5 (L) 01/19/2018   MCV 77.7 (L) 01/19/2018   PLT 300 01/19/2018   BMET    Component Value Date/Time   NA 139 01/14/2018 2215   K 3.7 01/14/2018 2215   CL 101 01/14/2018 2215   CO2 24 01/14/2018 2215   GLUCOSE 204 (H) 01/14/2018 2215   BUN 19 01/14/2018 2215   CREATININE 1.28 (H) 01/19/2018 2140   CALCIUM 8.7 (L) 01/14/2018 2215   GFRNONAA 53 (L) 01/19/2018 2140   GFRAA >60 01/19/2018 2140     Assessment/Plan: 1 Day Post-Op   Principal Problem:   Closed fracture of left distal radius and ulna Active Problems:   Closed fracture of left proximal humerus   Primary localized osteoarthrosis of shoulder  Acute blood loss anemia, will probably observe for now, I do not expect a lot of additional blood loss.  Lovenox for DVT prophylaxis.  He has significant limitations when it comes to mobility, he is status post left hip fusion, and is a fairly high fall risk, and now no  longer has the function of his left upper extremity to help get him out of a chair and get him mobile or support him with the use of a walker.  I am and have him work with physical therapy today, will see how functional he is.  He could potentially go home later today or tomorrow if he is safe, however I am a little suspicious he is going to need more support than that.  We will see how he does with physical therapy.  He has unusual risk factors that are likely to require inpatient management for more than 2 midnights.   Severity of Illness: The appropriate patient status for this patient is INPATIENT. Inpatient status is judged to be reasonable and necessary in order to provide the required intensity of service to ensure the patient's safety. The patient's presenting symptoms, physical exam findings, and initial radiographic and laboratory data in the context of their chronic comorbidities is felt to place them at high risk for further clinical deterioration. Furthermore, it is not anticipated that the patient will be medically stable for discharge from the hospital within 2 midnights of admission. The following factors support the patient status of inpatient.   " The patient's  presenting symptoms include left wrist pain, fracture, shoulder fracture, unable to use left upper extremity, with left hip fusion limiting ambulation chronically " The worrisome physical exam findings include decreased physical function of the left hand,. " The initial radiographic and laboratory data are worrisome because of left wrist and shoulder fracture status post ORIF. " The chronic co-morbidities include  Past Medical History:  Diagnosis Date  . Anemia    2012  . Arthritis   . Blood transfusion   . 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 Closed fracture of left distal radius and ulna 01/19/2018  . Closed fracture of left proximal humerus 01/19/2018  . COPD (chronic obstructive pulmonary disease) ([place])   . Diabetes mellitus    . GERD (gastroesophageal reflux disease)   . Heart murmur   . Hiatal hernia   . Hyperlipidemia   . Hypertension   . NSTEMI (non-ST elevated myocardial infarction) ([place]) 07/2011   in setting of Pneumonia with normal coronary arteries on cath  . Pneumonia hx 8/12  . Severe aortic stenosis 2012   s/p bioprosthetic AVR  . Shortness of breath    with exertion  . Upper GI bleeding    .   * I certify that at the point of admission it is my clinical judgment that the patient will require inpatient hospital care spanning beyond 2 midnights from the point of admission due to high intensity of service, high risk for further deterioration and high frequency of surveillance required.Lenetta Quaker Mauricia Mertens 01/20/2018, 7:38 AM   Marchia Bond, MD Cell [phone]

## 2018-01-20 NOTE — Evaluation (Addendum)
Physical Therapy Evaluation Patient Details Name: Casey Gilmore MRN: [id number] DOB: March 02, 1941 Today's Date: 01/20/2018   History of Present Illness  77 y.o. male s/p L ORIF proximal humeral fx and distal radial fx 2/14. PMH includes: L hip fusion, HTN, COPD, L TKA, R THA, aortic valve replacement.     Clinical Impression  Patient is s/p above surgery resulting in functional limitations due to the deficits listed below (see PT Problem List). PTA, patient living with son in 1 story home with stairs to enter, ambulating without AD. Pts son works throughout day and will not be avail for support upon discharge. Upon eval, pt presents with hx of fall, high levels of pain and weakness in LUE, and limitations in ROM and strength from prior L hip fusion. At this time patient min A with mobility and presents as a falls risk. Able to ambulate short distances in hospital room with [facility] and guarding. Patient states preference to return home but after discussion with therapies, open minded to receiving SNF level rehab to improve safety and mobility before returning home. Patient will benefit from skilled PT to increase their independence and safety with mobility to allow discharge to the venue listed below.       Follow Up Recommendations SNF;Supervision/Assistance - 24 hour,    Equipment Recommendations    Hemi-Walker, 3 in 1 Commode   Recommendations for Other Services       Precautions / Restrictions Precautions Precautions: Fall Precaution Comments: hx of falls. prior hip fusion, NWB LUE Required Braces or Orthoses: Sling Restrictions Weight Bearing Restrictions: Yes LUE Weight Bearing: Non weight bearing      Mobility  Bed Mobility Overal bed mobility: Needs Assistance Bed Mobility: Supine to Sit     Supine to sit: Min assist     General bed mobility comments: Min A to support trunk and stabilize as he powers up.   Transfers Overall transfer level: Needs assistance Equipment  used: Straight cane;1 person hand held assist Transfers: Sit to/from Stand Sit to Stand: Min assist         General transfer comment: Min A to power up and stabilize, patient able to stand without AD at this time.   Ambulation/Gait Ambulation/Gait assistance: Min guard Ambulation Distance (Feet): 15 Feet Assistive device: Straight cane Gait Pattern/deviations: Step-through pattern;Antalgic Gait velocity: decreased   General Gait Details: decreased balance and velocity, impairtments with gait due to L hip fusion.   Stairs            Wheelchair Mobility    Modified Rankin (Stroke Patients Only)       Balance Overall balance assessment: History of Falls;Needs assistance Sitting-balance support: Feet supported Sitting balance-Leahy Scale: Fair     Standing balance support: During functional activity;Single extremity supported Standing balance-Leahy Scale: Fair                               Pertinent Vitals/Pain Pain Assessment: Faces Faces Pain Scale: Hurts whole lot Pain Location: L arm Pain Descriptors / Indicators: Discomfort;Grimacing;Operative site guarding Pain Intervention(s): Limited activity within patient's tolerance;Monitored during session;Premedicated before session;Repositioned    Home Living Family/patient expects to be discharged to:: Private residence Living Arrangements: Alone Available Help at Discharge: Family;Available PRN/intermittently(son lives next door, works workdays) Type of Home: House Home Access: Stairs to enter Entrance Stairs-Rails: Chemical engineer of Steps: [place]: One level Home Equipment: [place] - single point  Prior Function Level of Independence: Independent               Hand Dominance        Extremity/Trunk Assessment   Upper Extremity Assessment Upper Extremity Assessment: Defer to OT evaluation    Lower Extremity Assessment Lower Extremity Assessment: (RLE  strength WNL, LLE limited by hip fusion, WNL knee/ankle)       Communication   Communication: No difficulties  Cognition Arousal/Alertness: Awake/alert Behavior During Therapy: WFL for tasks assessed/performed Overall Cognitive Status: Within Functional Limits for tasks assessed                                        General Comments General comments (skin integrity, edema, etc.): Discussed benefits of SNF rehab prior to return to home, safety considerations for home    Exercises     Assessment/Plan    PT Assessment Patient needs continued PT services  PT Problem List Decreased strength;Decreased range of motion;Decreased activity tolerance;Decreased balance;Decreased mobility;Decreased safety awareness;Pain       PT Treatment Interventions DME instruction;Gait training;Stair training;Functional mobility training;Therapeutic activities;Therapeutic exercise;Balance training    PT Goals (Current goals can be found in the Care Plan section)  Acute Rehab PT Goals Patient Stated Goal: open to rehab first before return home PT Goal Formulation: With patient Time For Goal Achievement: 01/27/18 Potential to Achieve Goals: Good    Frequency Min 3X/week   Barriers to discharge Decreased caregiver support son works throughout the day    Co-evaluation PT/OT/SLP Co-Evaluation/Treatment: Yes Reason for Co-Treatment: For patient/therapist safety;To address functional/ADL transfers;Complexity of the patient's impairments (multi-system involvement) PT goals addressed during session: Mobility/safety with mobility;Balance;Proper use of DME;Strengthening/ROM         AM-PAC PT "6 Clicks" Daily Activity  Outcome Measure Difficulty turning over in bed (including adjusting bedclothes, sheets and blankets)?: Unable Difficulty moving from lying on back to sitting on the side of the bed? : Unable Difficulty sitting down on and standing up from a chair with arms (e.g.,  wheelchair, bedside commode, etc,.)?: Unable Help needed moving to and from a bed to chair (including a wheelchair)?: A Little Help needed walking in hospital room?: A Little Help needed climbing 3-5 steps with a railing? : A Lot 6 Click Score: 11    End of Session Equipment Utilized During Treatment: Gait belt Activity Tolerance: Patient limited by pain Patient left: in chair;with call bell/phone within reach(OT in room) Nurse Communication: Mobility status PT Visit Diagnosis: Unsteadiness on feet (R26.81);Other abnormalities of gait and mobility (R26.89);History of falling (Z91.81);Muscle weakness (generalized) (M62.81);Pain;Difficulty in walking, not elsewhere classified (R26.2) Pain - Right/Left: Left Pain - part of body: Arm    Time: 9163-8466 PT Time Calculation (min) (ACUTE ONLY): 30 min   Charges:   PT Evaluation $PT Eval Moderate Complexity: 1 Mod PT Treatments $Gait Training: 8-22 mins   PT G Codes:       Reinaldo Berber, PT, DPT Acute Rehab Services Pager: [phone]    Reinaldo Berber 01/20/2018, 10:05 AM

## 2018-01-20 NOTE — Clinical Social Work Note (Signed)
Clinical Social Work Assessment  Patient Details  Name: Casey Gilmore MRN: [id number] Date of Birth: December 14, 1940  Date of referral:  01/20/18               Reason for consult:  Facility Placement                Permission sought to share information with:  Chartered certified accountant granted to share information::  No  Name::        Agency::     Relationship::     Contact Information:     Housing/Transportation Living arrangements for the past 2 months:  Single Family Home Source of Information:  Patient, Adult Children Patient Interpreter Needed:  None Criminal Activity/Legal Involvement Pertinent to Current Situation/Hospitalization:  No - Comment as needed Significant Relationships:  Adult Children, Other Family Members Lives with:  Self Do you feel safe going back to the place where you live?  Yes Need for family participation in patient care:  Yes (Comment)  Care giving concerns:  Pt from home alone and clinical team recommending SNF.  CSW met with patient and son at bedside and patient declined SNF. Son indicated that family is next door and can assist with home needs.  Social Worker assessment / plan:  CSW signing off as RNCM with assist with home needs.  Employment status:  Retired Nurse, adult PT Recommendations:  [place] / Referral to community resources:  (Declined SNF)  Patient/Family's Response to care:  Patient/son in agreement with going home as he has good support at home and declines SNF.  Patient/Family's Understanding of and Emotional Response to Diagnosis, Current Treatment, and Prognosis:  Pt/family has good understanding of the diagnosis and impairment and desires to return home as he has good support. Son was at bedside and confirmed that patient resides around family and they typically provide him daily meals. CSW will defer to [facility] to set up home health assistance. No issues and  concerns.  Emotional Assessment Appearance:  Appears stated age Attitude/Demeanor/Rapport:  (Cooperative) Affect (typically observed):  Accepting, Appropriate Orientation:  Oriented to Self, Oriented to Place, Oriented to  Time, Oriented to Situation Alcohol / Substance use:  Not Applicable Psych involvement (Current and /or in the community):  No (Comment)  Discharge Needs  Concerns to be addressed:  Discharge Planning Concerns Readmission within the last 30 days:  No Current discharge risk:  Dependent with Mobility, Lives alone Barriers to Discharge:  No Barriers Identified   Normajean Baxter, LCSW 01/20/2018, 1:27 PM

## 2018-01-21 LAB — BASIC METABOLIC PANEL
Anion gap: 12 (ref 5–15)
BUN: 24 mg/dL — AB (ref 6–20)
CALCIUM: 8.3 mg/dL — AB (ref 8.9–10.3)
CHLORIDE: 103 mmol/L (ref 101–111)
CO2: 24 mmol/L (ref 22–32)
CREATININE: 1.34 mg/dL — AB (ref 0.61–1.24)
GFR calc Af Amer: 58 mL/min — ABNORMAL LOW (ref >60)
GFR calc non Af Amer: 50 mL/min — ABNORMAL LOW (ref >60)
Glucose, Bld: 145 mg/dL — ABNORMAL HIGH (ref 65–99)
Potassium: 4 mmol/L (ref 3.5–5.1)
SODIUM: 139 mmol/L (ref 135–145)

## 2018-01-21 LAB — BPAM RBC
BLOOD PRODUCT EXPIRATION DATE: 201903042359
Blood Product Expiration Date: 201903112359
ISSUE DATE / TIME: 201902151837
ISSUE DATE / TIME: 201902151330
UNIT TYPE AND RH: 5100
UNIT TYPE AND RH: 5100

## 2018-01-21 LAB — TYPE AND SCREEN
ABO/RH(D): O POS
ANTIBODY SCREEN: NEGATIVE
Unit division: 0
Unit division: 0

## 2018-01-21 LAB — CBC
HCT: 31.8 % — ABNORMAL LOW (ref 39.0–52.0)
Hemoglobin: 9.8 g/dL — ABNORMAL LOW (ref 13.0–17.0)
MCH: 24.5 pg — AB (ref 26.0–34.0)
MCHC: 30.8 g/dL (ref 30.0–36.0)
MCV: 79.5 fL (ref 78.0–100.0)
PLATELETS: 253 10*3/uL (ref 150–400)
RBC: 4 MIL/uL — ABNORMAL LOW (ref 4.22–5.81)
RDW: 15.2 % (ref 11.5–15.5)
WBC: 9.6 10*3/uL (ref 4.0–10.5)

## 2018-01-21 LAB — GLUCOSE, CAPILLARY
GLUCOSE-CAPILLARY: 126 mg/dL — AB (ref 65–99)
GLUCOSE-CAPILLARY: 124 mg/dL — AB (ref 65–99)
GLUCOSE-CAPILLARY: 102 mg/dL — AB (ref 65–99)
Glucose-Capillary: 164 mg/dL — ABNORMAL HIGH (ref 65–99)

## 2018-01-21 NOTE — Progress Notes (Signed)
Occupational Therapy Treatment Patient Details Name: SLAYDEN MENNENGA MRN: [id number] DOB: [birth year] Today's Date: 01/21/2018    History of present illness 77 y.o. male s/p ORIF proximal humeral fx and distal radial fx 2/14. PMH includes: L hip fusion, HTN, COPD, L TKA, R THA, aortic valve replacement.    OT comments  Pt making progress with functional goals, and will d/c home now vs SNF. Pt's son that lives next door reports that he will be assisting him and OT reiterate importance/safety of pt not being at home alone initially. Pt's two sons present and educated on sling/shoulder protocol with handout, ADL and ADL mobility safety. OT will continue to follow cutely  Follow Up Recommendations  SNF;Supervision/Assistance - 24 hour(pt/family refusing SNF and going [facility] with [facility])    Equipment Recommendations  Other (comment);3 in 1 bedside commode(reacher)    Recommendations for Other Services      Precautions / Restrictions Precautions Precautions: Fall Precaution Comments: prior hip fusion, NWB LUE Required Braces or Orthoses: Sling Restrictions Weight Bearing Restrictions: Yes LUE Weight Bearing: Non weight bearing       Mobility Bed Mobility               General bed mobility comments: pt up in recliner upon arrival  Transfers Overall transfer level: Needs assistance Equipment used: Straight cane;1 person hand held assist;Hemi-walker Transfers: Sit to/from [facility] Sit to Stand: Min assist Stand pivot transfers: Min assist       General transfer comment: Min A to power up and stabilize, transfer to [facility]    Balance Overall balance assessment: Needs assistance Sitting-balance support: Feet supported       Standing balance support: During functional activity;Single extremity supported                               ADL either performed or assessed with clinical judgement   ADL Overall ADL's : Needs assistance/impaired      Grooming: Minimal assistance;Sitting;With caregiver independent assisting   Upper Body Bathing: Moderate assistance;Sitting;With caregiver independent assisting   Lower Body Bathing: Moderate assistance;Sit to/from stand;With caregiver independent assisting   Upper Body Dressing : Moderate assistance;Sitting;With caregiver independent assisting   Lower Body Dressing: Moderate assistance;Sit to/from stand;With caregiver independent assisting   Toilet Transfer: Minimal assistance;Stand-pivot;BSC(hemiwalker and SPC)   Toileting- Clothing Manipulation and Hygiene: Minimal assistance;Sit to/from stand;With caregiver independent assisting         General ADL Comments: pt's two sons present and educated on sling/shoulder protocol with handout, ADL and ADL mobility safety     Vision Patient Visual Report: No change from baseline     Perception     Praxis      Cognition Arousal/Alertness: Awake/alert Behavior During Therapy: WFL for tasks assessed/performed Overall Cognitive Status: Within Functional Limits for tasks assessed                                          Exercises Other Exercises Other Exercises: ROM of L digits and elbow   Shoulder Instructions       General Comments      Pertinent Vitals/ Pain       Pain Assessment: 0-10 Pain Score: 6  Pain Location: L arm Pain Descriptors / Indicators: Discomfort;Grimacing;Operative site guarding Pain Intervention(s): Limited activity within patient's tolerance;Monitored during session;Repositioned;Ice applied  Home  Living                                          Prior Functioning/Environment              Frequency  Min 3X/week        Progress Toward Goals  OT Goals(current goals can now be found in the care plan section)  Progress towards OT goals: Progressing toward goals     Plan Discharge plan needs to be updated    Co-evaluation                 AM-PAC  PT "6 Clicks" Daily Activity     Outcome Measure   Help from another person eating meals?: None Help from another person taking care of personal grooming?: A Little Help from another person toileting, which includes using toliet, bedpan, or urinal?: A Little Help from another person bathing (including washing, rinsing, drying)?: A Lot Help from another person to put on and taking off regular upper body clothing?: A Lot Help from another person to put on and taking off regular lower body clothing?: A Lot 6 Click Score: 16    End of Session Equipment Utilized During Treatment: Gait belt(hemiwalker, cane, BSC, sling)  OT Visit Diagnosis: Unsteadiness on feet (R26.81);Pain Pain - Right/Left: Left Pain - part of body: Shoulder   Activity Tolerance Patient tolerated treatment well   Patient Left in chair;with call bell/phone within reach;with family/visitor present   Nurse Communication      Functional Assessment Tool Used: AM-PAC 6 Clicks Daily Activity   Time: 1020-1048 OT Time Calculation (min): 28 min  Charges: OT G-codes **NOT FOR INPATIENT CLASS** Functional Assessment Tool Used: AM-PAC 6 Clicks Daily Activity OT General Charges $OT Visit: 1 Visit OT Treatments $Self Care/Home Management : 8-22 mins $Therapeutic Activity: 23-37 mins     Britt Bottom 01/21/2018, 11:39 AM

## 2018-01-21 NOTE — Progress Notes (Signed)
Patient ID: GEMINI BEAUMIER, male   DOB: 11-Feb-1941, 77 y.o.   MRN: [id number]     Subjective:  Patient reports pain as mild to moderate.  Patient is in the chair and in no acute distress   Objective:   VITALS:   Vitals:   01/20/18 1908 01/20/18 2042 01/21/18 0555 01/21/18 0830  BP: 136/61 130/69 115/62 125/77  Pulse: 93 93 88 81  Resp: 20 20 18 18   Temp: 98.3 F (36.8 C) 99.1 F (37.3 C) 98.7 F (37.1 C)   TempSrc: Axillary Oral Oral   SpO2: 94% 94% 91% 93%  Weight:        ABD soft Sensation intact distally Dorsiflexion/Plantar flexion intact Incision: dressing C/D/I and no drainage Right wrist in a splint and shoulder in a sling  Lab Results  Component Value Date   WBC 9.6 01/21/2018   HGB 9.8 (L) 01/21/2018   HCT 31.8 (L) 01/21/2018   MCV 79.5 01/21/2018   PLT 253 01/21/2018   BMET    Component Value Date/Time   NA 139 01/21/2018 0451   K 4.0 01/21/2018 0451   CL 103 01/21/2018 0451   CO2 24 01/21/2018 0451   GLUCOSE 145 (H) 01/21/2018 0451   BUN 24 (H) 01/21/2018 0451   CREATININE 1.34 (H) 01/21/2018 0451   CALCIUM 8.3 (L) 01/21/2018 0451   GFRNONAA 50 (L) 01/21/2018 0451   GFRAA 58 (L) 01/21/2018 0451     Assessment/Plan: 2 Days Post-Op   Principal Problem:   Closed fracture of left distal radius and ulna Active Problems:   Closed fracture of left proximal humerus   Primary localized osteoarthrosis of shoulder   Advance diet Up with therapy Plan for discharge tomorrow Consult CIR for all options Sling at all times  NWB left upper ext    Remonia Richter 01/21/2018, 9:58 AM   Marchia Bond, MD Cell [phone]

## 2018-01-22 LAB — GLUCOSE, CAPILLARY
GLUCOSE-CAPILLARY: 113 mg/dL — AB (ref 65–99)
GLUCOSE-CAPILLARY: 159 mg/dL — AB (ref 65–99)
Glucose-Capillary: 92 mg/dL (ref 65–99)

## 2018-01-22 LAB — CBC
HCT: 33.9 % — ABNORMAL LOW (ref 39.0–52.0)
Hemoglobin: 10.7 g/dL — ABNORMAL LOW (ref 13.0–17.0)
MCH: 24.9 pg — ABNORMAL LOW (ref 26.0–34.0)
MCHC: 31.6 g/dL (ref 30.0–36.0)
MCV: 78.8 fL (ref 78.0–100.0)
PLATELETS: 214 10*3/uL (ref 150–400)
RBC: 4.3 MIL/uL (ref 4.22–5.81)
RDW: 16.2 % — AB (ref 11.5–15.5)
WBC: 8.1 10*3/uL (ref 4.0–10.5)

## 2018-01-22 NOTE — Progress Notes (Signed)
Patient ID: Casey Gilmore, male   DOB: 1941-08-14, 77 y.o.   MRN: [id number]     Subjective:  Patient reports pain as mild.  Patient states that he is good to go home and would like to leave today  Objective:   VITALS:   Vitals:   01/21/18 1225 01/21/18 2117 01/22/18 0650 01/22/18 1412  BP: 126/74 129/66 128/73 137/85  Pulse: 84 82 83 86  Resp: 16 16 16 18   Temp:  98.7 F (37.1 C) 99.3 F (37.4 C) 98.5 F (36.9 C)  TempSrc:  Oral Oral Oral  SpO2: 96% 94% 91% 96%  Weight:        ABD soft Sensation intact distally Dorsiflexion/Plantar flexion intact Incision: dressing C/D/I and no drainage Left wrist splint and left arm in a sling  Lab Results  Component Value Date   WBC 8.1 01/22/2018   HGB 10.7 (L) 01/22/2018   HCT 33.9 (L) 01/22/2018   MCV 78.8 01/22/2018   PLT 214 01/22/2018   BMET    Component Value Date/Time   NA 139 01/21/2018 0451   K 4.0 01/21/2018 0451   CL 103 01/21/2018 0451   CO2 24 01/21/2018 0451   GLUCOSE 145 (H) 01/21/2018 0451   BUN 24 (H) 01/21/2018 0451   CREATININE 1.34 (H) 01/21/2018 0451   CALCIUM 8.3 (L) 01/21/2018 0451   GFRNONAA 50 (L) 01/21/2018 0451   GFRAA 58 (L) 01/21/2018 0451     Assessment/Plan: 3 Days Post-Op   Principal Problem:   Closed fracture of left distal radius and ulna Active Problems:   Closed fracture of left proximal humerus   Primary localized osteoarthrosis of shoulder   Advance diet Up with therapy Discharge home with home health NWB left upper ext Dry dressing PRN Follow up wit Dr Mardelle Matte in one week   Rande Brunt, Erlene Quan 01/22/2018, 4:14 PM   Marchia Bond, MD Cell [phone]

## 2018-01-22 NOTE — Progress Notes (Signed)
[name] to be D/C'd Home per MD order.  Discussed prescriptions and follow up appointments with the patient. Prescriptions given to patient, medication list explained in detail. Pt verbalized understanding.  Allergies as of 01/22/2018   No Known Allergies     Medication List    STOP taking these medications   aspirin EC 81 MG tablet   HYDROcodone-acetaminophen 5-325 MG tablet Commonly known as:  NORCO/VICODIN Replaced by:  HYDROcodone-acetaminophen 10-325 MG tablet   oxyCODONE 5 MG immediate release tablet Commonly known as:  Oxy IR/ROXICODONE     TAKE these medications   cyanocobalamin 2000 MCG tablet Take 4,000 mcg by mouth daily.   furosemide 40 MG tablet Commonly known as:  LASIX Take 40 mg by mouth daily.   glimepiride 4 MG tablet Commonly known as:  AMARYL Take 4 mg by mouth daily before breakfast.   HYDROcodone-acetaminophen 10-325 MG tablet Commonly known as:  NORCO Take 1 tablet by mouth every 6 (six) hours as needed. Replaces:  HYDROcodone-acetaminophen 5-325 MG tablet   metFORMIN 1000 MG tablet Commonly known as:  GLUCOPHAGE Take 1,000 mg by mouth 2 (two) times daily with a meal.   omeprazole 40 MG capsule Commonly known as:  PRILOSEC Take 40 mg by mouth daily.   ondansetron 4 MG tablet Commonly known as:  ZOFRAN Take 1 tablet (4 mg total) by mouth every 8 (eight) hours as needed for nausea or vomiting.   potassium chloride SA 20 MEQ tablet Commonly known as:  K-DUR,KLOR-CON Take 20 mEq by mouth daily.   pravastatin 20 MG tablet Commonly known as:  PRAVACHOL Take 20 mg by mouth daily.   PRESCRIPTION MEDICATION Inject 40 Units into the skin daily. Insulin Doctor is giving him samples of            Durable Medical Equipment  (From admission, onward)        Start     Ordered   01/20/18 1330  For home use only DME 3 n 1  Once     01/20/18 1329   01/20/18 1330  For home use only DME Other see comment  Once    Comments:  Right  hemi walker   01/20/18 1329      Vitals:   01/22/18 0650 01/22/18 1412  BP: 128/73 137/85  Pulse: 83 86  Resp: 16 18  Temp: 99.3 F (37.4 C) 98.5 F (36.9 C)  SpO2: 91% 96%    Skin clean, dry and intact without evidence of skin break down, no evidence of skin tears noted. Left arm has an aquacel dressing, compression wrap, gauze, and sling, clean, dry, and intact.  IV catheter discontinued intact. Site without signs and symptoms of complications. Dressing and pressure applied. Pt denies pain at this time. No complaints noted.  An After Visit Summary and prescriptions were printed  and given to the patient. Patient escorted via [place], and D/C home via private auto.  [place] RN

## 2018-01-22 NOTE — Discharge Summary (Signed)
Physician Discharge Summary  Patient ID: Casey Gilmore MRN: [id number] DOB/AGE: [birth year] 77 y.o.  Admit date: 01/19/2018 Discharge date: 01/22/2018  Admission Diagnoses:  Closed fracture of left distal radius and ulna  Discharge Diagnoses:  Principal Problem:   Closed fracture of left distal radius and ulna Active Problems:   Closed fracture of left proximal humerus   Primary localized osteoarthrosis of shoulder acute blood loss anemia, transfused 2 units prbcs Acute kidney injury   Past Medical History:  Diagnosis Date  . Anemia    2012  . Arthritis   . Blood transfusion   . 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 Closed fracture of left distal radius and ulna 01/19/2018  . Closed fracture of left proximal humerus 01/19/2018  . COPD (chronic obstructive pulmonary disease) ([place])   . Diabetes mellitus   . GERD (gastroesophageal reflux disease)   . Heart murmur   . Hiatal hernia   . Hyperlipidemia   . Hypertension   . NSTEMI (non-ST elevated myocardial infarction) ([place]) 07/2011   in setting of Pneumonia with normal coronary arteries on cath  . Pneumonia hx 8/12  . Severe aortic stenosis 2012   s/p bioprosthetic AVR  . Shortness of breath    with exertion  . Upper GI bleeding     Surgeries: Procedure(s): OPEN REDUCTION INTERNAL FIXATION (ORIF) PROXIMAL HUMERUS FRACTURE OPEN REDUCTION INTERNAL FIXATION (ORIF) DISTAL RADIAL FRACTURE on 01/19/2018   Consultants (if any):   Discharged Condition: Improved  Hospital Course: AHRON HULBERT is an 77 y.o. male who was admitted 01/19/2018 with a diagnosis of Closed fracture of left distal radius and ulna and went to the operating room on 01/19/2018 and underwent the above named procedures.    He was given perioperative antibiotics:  Anti-infectives (From admission, onward)   Start     Dose/Rate Route Frequency Ordered Stop   01/19/18 2300  ceFAZolin (ANCEF) IVPB 1 g/50 mL premix     1 g 100 mL/hr over 30 Minutes Intravenous  Every 6 hours 01/19/18 2021 01/20/18 1157   01/19/18 1200  ceFAZolin (ANCEF) IVPB 2g/100 mL premix     2 g 200 mL/hr over 30 Minutes Intravenous On call to O.R. 01/19/18 1148 01/19/18 1538   01/19/18 1150  ceFAZolin (ANCEF) 2-4 GM/100ML-% IVPB    Comments:  Rosenberger, Meredit: cabinet override      01/19/18 1150 01/19/18 1508    .  He was given sequential compression devices, early ambulation, and lovenox for DVT prophylaxis.  He has a hip fusion as well and very limited ambulation, which required inpatient management for safety and fall precautions and gait training given his acute inability to use his left upper extremity as a weight bearing limb, which he was previously needed for ambulation.    He benefited maximally from the hospital stay and there were no complications.    Recent vital signs:  Vitals:   01/22/18 0650 01/22/18 1412  BP: 128/73 137/85  Pulse: 83 86  Resp: 16 18  Temp: 99.3 F (37.4 C) 98.5 F (36.9 C)  SpO2: 91% 96%    Recent laboratory studies:  Lab Results  Component Value Date   HGB 10.7 (L) 01/22/2018   HGB 9.8 (L) 01/21/2018   HGB 7.3 (L) 01/20/2018   Lab Results  Component Value Date   WBC 8.1 01/22/2018   PLT 214 01/22/2018   Lab Results  Component Value Date   INR 1.06 10/12/2011   Lab Results  Component  Value Date   NA 139 01/21/2018   K 4.0 01/21/2018   CL 103 01/21/2018   CO2 24 01/21/2018   BUN 24 (H) 01/21/2018   CREATININE 1.34 (H) 01/21/2018   GLUCOSE 145 (H) 01/21/2018    Discharge Medications:   Allergies as of 01/22/2018   No Known Allergies     Medication List    STOP taking these medications   aspirin EC 81 MG tablet   HYDROcodone-acetaminophen 5-325 MG tablet Commonly known as:  NORCO/VICODIN Replaced by:  HYDROcodone-acetaminophen 10-325 MG tablet   oxyCODONE 5 MG immediate release tablet Commonly known as:  Oxy IR/ROXICODONE     TAKE these medications   cyanocobalamin 2000 MCG tablet Take 4,000 mcg  by mouth daily.   furosemide 40 MG tablet Commonly known as:  LASIX Take 40 mg by mouth daily.   glimepiride 4 MG tablet Commonly known as:  AMARYL Take 4 mg by mouth daily before breakfast.   HYDROcodone-acetaminophen 10-325 MG tablet Commonly known as:  NORCO Take 1 tablet by mouth every 6 (six) hours as needed. Replaces:  HYDROcodone-acetaminophen 5-325 MG tablet   metFORMIN 1000 MG tablet Commonly known as:  GLUCOPHAGE Take 1,000 mg by mouth 2 (two) times daily with a meal.   omeprazole 40 MG capsule Commonly known as:  PRILOSEC Take 40 mg by mouth daily.   ondansetron 4 MG tablet Commonly known as:  ZOFRAN Take 1 tablet (4 mg total) by mouth every 8 (eight) hours as needed for nausea or vomiting.   potassium chloride SA 20 MEQ tablet Commonly known as:  K-DUR,KLOR-CON Take 20 mEq by mouth daily.   pravastatin 20 MG tablet Commonly known as:  PRAVACHOL Take 20 mg by mouth daily.   PRESCRIPTION MEDICATION Inject 40 Units into the skin daily. Insulin Doctor is giving him samples of            Durable Medical Equipment  (From admission, onward)        Start     Ordered   01/20/18 1330  For home use only DME 3 n 1  Once     01/20/18 1329   01/20/18 1330  For home use only DME Other see comment  Once    Comments:  Right hemi walker   01/20/18 1329      Diagnostic Studies: Dg Wrist 2 Views Left  Result Date: 01/19/2018 CLINICAL DATA:  ORIF of left wrist fracture EXAM: DG C-ARM 61-120 MIN; LEFT WRIST - 2 VIEW COMPARISON:  01/15/2018 FLUOROSCOPY TIME:  Fluoroscopy Time:  56 seconds Radiation Exposure Index (if provided by the fluoroscopic device): Not available Number of Acquired Spot Images: 4 FINDINGS: Fixation sideplate is noted along the distal left radius. Multiple fixation screws are noted. The fracture fragments are in near anatomic alignment. IMPRESSION: ORIF of distal left radial fracture. Electronically Signed   By: Inez Catalina M.D.   On: 01/19/2018  19:25   Dg Wrist 2 Views Left  Result Date: 01/15/2018 CLINICAL DATA:  Post reduction EXAM: LEFT WRIST - 2 VIEW COMPARISON:  01/14/2018 FINDINGS: Interval casting of the wrist. Displaced ulnar styloid process fracture. Comminuted distal radius fracture with close to 1/2 bone with of radial displacement of distal fracture fragment, not significantly changed. IMPRESSION: Single view demonstrates casting of the wrist. Comminuted and displaced distal radius fracture without significant interval change on single view. Ulnar styloid process fracture Electronically Signed   By: Donavan Foil M.D.   On: 01/15/2018 02:54   Dg Wrist  Complete Left  Result Date: 01/14/2018 CLINICAL DATA:  Per EMS pt was at home opening door for son and was startled by a noise behind him, lost balance, and fell forward. Left shoulder deformity, left wrist deformity, numbness to left hand with cap refill less than right side. Pt has possible deformity to nose. No LOC. A&O. No recent falls. Does not normally use cane or walker. EMS states pt hypotensive on scene with lying palpated bp of 94 and unable to auscultate when standing. Pt has no N/V/D but does complain of dizziness since the fall. EXAM: LEFT WRIST - COMPLETE 3+ VIEW COMPARISON:  None. FINDINGS: There is a displaced comminuted fracture of the distal radius. Primary fracture is transverse across the metaphysis. Primary distal fracture component is displaced 10 mm in a radial direction and 6 mm dorsally. Fracture is mildly impacted. There is dorsal angulation of the articular surface measuring 26 degrees. Small focus of density is seen just distal to the distal ulna, likely a small avulsion from the ulnar styloid. No other evidence of a fracture.  No dislocation. There is surrounding soft tissue swelling. The bones are demineralized. IMPRESSION: 1. Displaced comminuted fracture of the distal radial metaphysis as described. Probable small avulsion fracture from the styloid process of  the ulna. No dislocation. Electronically Signed   By: Lajean Manes M.D.   On: 01/14/2018 23:06   Ct Head Wo Contrast  Result Date: 01/14/2018 CLINICAL DATA:  Fall with left shoulder deformity, nose deformity EXAM: CT HEAD WITHOUT CONTRAST CT MAXILLOFACIAL WITHOUT CONTRAST CT CERVICAL SPINE WITHOUT CONTRAST TECHNIQUE: Multidetector CT imaging of the head, cervical spine, and maxillofacial structures were performed using the standard protocol without intravenous contrast. Multiplanar CT image reconstructions of the cervical spine and maxillofacial structures were also generated. COMPARISON:  None. FINDINGS: CT HEAD FINDINGS Brain: No acute territorial infarction, hemorrhage or intracranial mass is visualized. Mild small vessel ischemic changes of the white matter. Moderate atrophy. Ventricle size within normal limits Vascular: No hyperdense vessels. Scattered calcifications at the carotid siphons. Skull: Normal. Negative for fracture or focal lesion. Other: None CT MAXILLOFACIAL FINDINGS Osseous: Bilateral mandibular heads are normally positioned. No mandibular fracture. Trace fluid in the right inferior mastoid. Zygomatic arches and pterygoid plates are intact. Minimal irregularity of the anterior nasal bones with vague lucency and step-off deformity on sagittal views suspect for minimal nasal bone fracture. Orbits: Negative. No traumatic or inflammatory finding. Sinuses: No sinus wall fracture. Small fluid level in the right maxillary sinus. Mucosal thickening in the ethmoid and sphenoid sinuses. Soft tissues: Mild soft tissue swelling over the nasal area. CT CERVICAL SPINE FINDINGS Alignment: No subluxation.  Facet alignment within normal limits. Skull base and vertebrae: No acute fracture. No primary bone lesion or focal pathologic process. Soft tissues and spinal canal: No prevertebral fluid or swelling. No visible canal hematoma. Disc levels: Moderate-to-marked degenerative changes at C5-C6 and C6-C7. Upper  chest: Lung apices clear. No thyroid mass. Carotid vascular calcification. Other: None IMPRESSION: 1. No CT evidence for acute intracranial abnormality. 2. No acute osseous abnormality of the cervical spine. 3. Soft tissue swelling over the nasal area with minimal nasal bone deformity, suspect for fracture. Electronically Signed   By: Donavan Foil M.D.   On: 01/14/2018 23:14   Ct Cervical Spine Wo Contrast  Result Date: 01/14/2018 CLINICAL DATA:  Fall with left shoulder deformity, nose deformity EXAM: CT HEAD WITHOUT CONTRAST CT MAXILLOFACIAL WITHOUT CONTRAST CT CERVICAL SPINE WITHOUT CONTRAST TECHNIQUE: Multidetector CT imaging of the  head, cervical spine, and maxillofacial structures were performed using the standard protocol without intravenous contrast. Multiplanar CT image reconstructions of the cervical spine and maxillofacial structures were also generated. COMPARISON:  None. FINDINGS: CT HEAD FINDINGS Brain: No acute territorial infarction, hemorrhage or intracranial mass is visualized. Mild small vessel ischemic changes of the white matter. Moderate atrophy. Ventricle size within normal limits Vascular: No hyperdense vessels. Scattered calcifications at the carotid siphons. Skull: Normal. Negative for fracture or focal lesion. Other: None CT MAXILLOFACIAL FINDINGS Osseous: Bilateral mandibular heads are normally positioned. No mandibular fracture. Trace fluid in the right inferior mastoid. Zygomatic arches and pterygoid plates are intact. Minimal irregularity of the anterior nasal bones with vague lucency and step-off deformity on sagittal views suspect for minimal nasal bone fracture. Orbits: Negative. No traumatic or inflammatory finding. Sinuses: No sinus wall fracture. Small fluid level in the right maxillary sinus. Mucosal thickening in the ethmoid and sphenoid sinuses. Soft tissues: Mild soft tissue swelling over the nasal area. CT CERVICAL SPINE FINDINGS Alignment: No subluxation.  Facet  alignment within normal limits. Skull base and vertebrae: No acute fracture. No primary bone lesion or focal pathologic process. Soft tissues and spinal canal: No prevertebral fluid or swelling. No visible canal hematoma. Disc levels: Moderate-to-marked degenerative changes at C5-C6 and C6-C7. Upper chest: Lung apices clear. No thyroid mass. Carotid vascular calcification. Other: None IMPRESSION: 1. No CT evidence for acute intracranial abnormality. 2. No acute osseous abnormality of the cervical spine. 3. Soft tissue swelling over the nasal area with minimal nasal bone deformity, suspect for fracture. Electronically Signed   By: Donavan Foil M.D.   On: 01/14/2018 23:14   Dg Shoulder Left  Result Date: 01/19/2018 CLINICAL DATA:  ORIF of left humeral fracture EXAM: DG C-ARM 61-120 MIN; LEFT SHOULDER - 2+ VIEW COMPARISON:  None. FLUOROSCOPY TIME:  Fluoroscopy Time:  1 minute 36 seconds Radiation Exposure Index (if provided by the fluoroscopic device): Not available Number of Acquired Spot Images: 3 FINDINGS: Fixation plate with multiple fixation screws is noted in the proximal left humerus. The fracture fragments are in near anatomic alignment. IMPRESSION: ORIF of left humeral fracture. Electronically Signed   By: Inez Catalina M.D.   On: 01/19/2018 19:21   Dg Shoulder Left  Result Date: 01/14/2018 CLINICAL DATA:  Per EMS pt was at home opening door for son and was startled by a noise behind him, lost balance, and fell forward. Left shoulder deformity, left wrist deformity, numbness to left hand with cap refill less than right side. Pt has possible deformity to nose. No LOC. A&O. No recent falls. Does not normally use cane or walker. EMS states pt hypotensive on scene with lying palpated bp of 94 and unable to auscultate when standing. Pt has no N/V/D but does complain of dizziness since the fall. EXAM: LEFT SHOULDER - 2+ VIEW COMPARISON:  None. FINDINGS: There is a comminuted fracture of the proximal humerus.  There is a fracture that extends across the anatomic neck. There is a separate comminuted fracture component of the greater tuberosity. The shaft fracture component has retracted superiorly by 2-1/2 cm and is displaced anteriorly. The humeral head component remains normally aligned with the glenoid. There are no other fractures.  The [facility] joint is normally aligned. Bones are diffusely demineralized. There is surrounding soft tissue swelling. IMPRESSION: Comminuted displaced fracture of the proximal humerus. No dislocation. Electronically Signed   By: Lajean Manes M.D.   On: 01/14/2018 23:08   Dg C-arm 1-60 Min  Result Date: 01/19/2018 CLINICAL DATA:  ORIF of left wrist fracture EXAM: DG C-ARM 61-120 MIN; LEFT WRIST - 2 VIEW COMPARISON:  01/15/2018 FLUOROSCOPY TIME:  Fluoroscopy Time:  56 seconds Radiation Exposure Index (if provided by the fluoroscopic device): Not available Number of Acquired Spot Images: 4 FINDINGS: Fixation sideplate is noted along the distal left radius. Multiple fixation screws are noted. The fracture fragments are in near anatomic alignment. IMPRESSION: ORIF of distal left radial fracture. Electronically Signed   By: Inez Catalina M.D.   On: 01/19/2018 19:25   Dg C-arm 1-60 Min  Result Date: 01/19/2018 CLINICAL DATA:  ORIF of left humeral fracture EXAM: DG C-ARM 61-120 MIN; LEFT SHOULDER - 2+ VIEW COMPARISON:  None. FLUOROSCOPY TIME:  Fluoroscopy Time:  1 minute 36 seconds Radiation Exposure Index (if provided by the fluoroscopic device): Not available Number of Acquired Spot Images: 3 FINDINGS: Fixation plate with multiple fixation screws is noted in the proximal left humerus. The fracture fragments are in near anatomic alignment. IMPRESSION: ORIF of left humeral fracture. Electronically Signed   By: Inez Catalina M.D.   On: 01/19/2018 19:21   Dg C-arm 1-60 Min  Result Date: 01/19/2018 CLINICAL DATA:  ORIF of left humeral fracture EXAM: DG C-ARM 61-120 MIN; LEFT SHOULDER - 2+ VIEW  COMPARISON:  None. FLUOROSCOPY TIME:  Fluoroscopy Time:  1 minute 36 seconds Radiation Exposure Index (if provided by the fluoroscopic device): Not available Number of Acquired Spot Images: 3 FINDINGS: Fixation plate with multiple fixation screws is noted in the proximal left humerus. The fracture fragments are in near anatomic alignment. IMPRESSION: ORIF of left humeral fracture. Electronically Signed   By: Inez Catalina M.D.   On: 01/19/2018 19:21   Dg Hip Unilat With Pelvis 2-3 Views Left  Result Date: 01/15/2018 CLINICAL DATA:  Status post fall, with concern for left hip injury. Initial encounter. EXAM: DG HIP (WITH OR WITHOUT PELVIS) 2-3V LEFT COMPARISON:  None. FINDINGS: There is no evidence of fracture or dislocation. The patient is status post fusion of the left hip, with associated hardware. The right hip arthroplasty is grossly unremarkable in appearance, though incompletely imaged. There is no evidence of loosening. The sacroiliac joints are grossly unremarkable. Mild degenerative change is noted along the lower lumbar spine. The visualized bowel gas pattern is within normal limits. IMPRESSION: Status post fusion of the left hip, with associated hardware. No evidence of loosening. No evidence of fracture. Electronically Signed   By: Garald Balding M.D.   On: 01/15/2018 00:59   Ct Maxillofacial Wo Contrast  Result Date: 01/14/2018 CLINICAL DATA:  Fall with left shoulder deformity, nose deformity EXAM: CT HEAD WITHOUT CONTRAST CT MAXILLOFACIAL WITHOUT CONTRAST CT CERVICAL SPINE WITHOUT CONTRAST TECHNIQUE: Multidetector CT imaging of the head, cervical spine, and maxillofacial structures were performed using the standard protocol without intravenous contrast. Multiplanar CT image reconstructions of the cervical spine and maxillofacial structures were also generated. COMPARISON:  None. FINDINGS: CT HEAD FINDINGS Brain: No acute territorial infarction, hemorrhage or intracranial mass is visualized. Mild  small vessel ischemic changes of the white matter. Moderate atrophy. Ventricle size within normal limits Vascular: No hyperdense vessels. Scattered calcifications at the carotid siphons. Skull: Normal. Negative for fracture or focal lesion. Other: None CT MAXILLOFACIAL FINDINGS Osseous: Bilateral mandibular heads are normally positioned. No mandibular fracture. Trace fluid in the right inferior mastoid. Zygomatic arches and pterygoid plates are intact. Minimal irregularity of the anterior nasal bones with vague lucency and step-off deformity on sagittal views suspect  for minimal nasal bone fracture. Orbits: Negative. No traumatic or inflammatory finding. Sinuses: No sinus wall fracture. Small fluid level in the right maxillary sinus. Mucosal thickening in the ethmoid and sphenoid sinuses. Soft tissues: Mild soft tissue swelling over the nasal area. CT CERVICAL SPINE FINDINGS Alignment: No subluxation.  Facet alignment within normal limits. Skull base and vertebrae: No acute fracture. No primary bone lesion or focal pathologic process. Soft tissues and spinal canal: No prevertebral fluid or swelling. No visible canal hematoma. Disc levels: Moderate-to-marked degenerative changes at C5-C6 and C6-C7. Upper chest: Lung apices clear. No thyroid mass. Carotid vascular calcification. Other: None IMPRESSION: 1. No CT evidence for acute intracranial abnormality. 2. No acute osseous abnormality of the cervical spine. 3. Soft tissue swelling over the nasal area with minimal nasal bone deformity, suspect for fracture. Electronically Signed   By: Donavan Foil M.D.   On: 01/14/2018 23:14    Disposition: 01-Home or Self Care    Follow-up Information    Marchia Bond, MD. Schedule an appointment as soon as possible for a visit in 2 week(s).   Specialty:  Orthopedic Surgery Contact information: [address] [place] [address] [phone]        [facility] - Dme Follow up.    Why:  A representative from [facility] will contact you to arrange start date and time for your therapy.  Contact information: [address] [place] [address] [phone]            Signed: Johnny Bridge 01/22/2018, 5:18 PM

## 2018-01-27 DIAGNOSIS — Z79891 Long term (current) use of opiate analgesic: Secondary | ICD-10-CM | POA: Diagnosis not present

## 2018-01-27 DIAGNOSIS — S42202D Unspecified fracture of upper end of left humerus, subsequent encounter for fracture with routine healing: Secondary | ICD-10-CM | POA: Diagnosis not present

## 2018-01-27 DIAGNOSIS — E119 Type 2 diabetes mellitus without complications: Secondary | ICD-10-CM | POA: Diagnosis not present

## 2018-01-27 DIAGNOSIS — Z794 Long term (current) use of insulin: Secondary | ICD-10-CM | POA: Diagnosis not present

## 2018-01-27 DIAGNOSIS — Z951 Presence of aortocoronary bypass graft: Secondary | ICD-10-CM | POA: Diagnosis not present

## 2018-01-27 DIAGNOSIS — S52502D Unspecified fracture of the lower end of left radius, subsequent encounter for closed fracture with routine healing: Secondary | ICD-10-CM | POA: Diagnosis not present

## 2018-02-01 DIAGNOSIS — S52502A Unspecified fracture of the lower end of left radius, initial encounter for closed fracture: Secondary | ICD-10-CM | POA: Diagnosis not present

## 2018-02-01 DIAGNOSIS — M8588 Other specified disorders of bone density and structure, other site: Secondary | ICD-10-CM | POA: Diagnosis not present

## 2018-02-01 DIAGNOSIS — Z13820 Encounter for screening for osteoporosis: Secondary | ICD-10-CM | POA: Diagnosis not present

## 2018-02-01 DIAGNOSIS — S42202D Unspecified fracture of upper end of left humerus, subsequent encounter for fracture with routine healing: Secondary | ICD-10-CM | POA: Diagnosis not present

## 2018-02-22 DIAGNOSIS — S42202D Unspecified fracture of upper end of left humerus, subsequent encounter for fracture with routine healing: Secondary | ICD-10-CM | POA: Diagnosis not present

## 2018-02-23 DIAGNOSIS — M810 Age-related osteoporosis without current pathological fracture: Secondary | ICD-10-CM | POA: Diagnosis not present

## 2018-02-23 DIAGNOSIS — S52502A Unspecified fracture of the lower end of left radius, initial encounter for closed fracture: Secondary | ICD-10-CM | POA: Diagnosis not present

## 2018-02-23 DIAGNOSIS — E559 Vitamin D deficiency, unspecified: Secondary | ICD-10-CM | POA: Diagnosis not present

## 2018-03-22 DIAGNOSIS — E1165 Type 2 diabetes mellitus with hyperglycemia: Secondary | ICD-10-CM | POA: Diagnosis not present

## 2018-03-22 DIAGNOSIS — M25519 Pain in unspecified shoulder: Secondary | ICD-10-CM | POA: Diagnosis not present

## 2018-03-22 DIAGNOSIS — I5022 Chronic systolic (congestive) heart failure: Secondary | ICD-10-CM | POA: Diagnosis not present

## 2018-03-22 DIAGNOSIS — S42202D Unspecified fracture of upper end of left humerus, subsequent encounter for fracture with routine healing: Secondary | ICD-10-CM | POA: Diagnosis not present

## 2018-03-22 DIAGNOSIS — I110 Hypertensive heart disease with heart failure: Secondary | ICD-10-CM | POA: Diagnosis not present

## 2018-03-31 DIAGNOSIS — E119 Type 2 diabetes mellitus without complications: Secondary | ICD-10-CM | POA: Diagnosis not present

## 2018-04-07 DIAGNOSIS — E1165 Type 2 diabetes mellitus with hyperglycemia: Secondary | ICD-10-CM | POA: Diagnosis not present

## 2018-04-15 ENCOUNTER — Emergency Department (HOSPITAL_COMMUNITY): Payer: Medicare Other

## 2018-04-15 ENCOUNTER — Encounter (HOSPITAL_COMMUNITY): Payer: Self-pay

## 2018-04-15 ENCOUNTER — Other Ambulatory Visit: Payer: Self-pay

## 2018-04-15 ENCOUNTER — Inpatient Hospital Stay (HOSPITAL_COMMUNITY): Payer: Medicare Other

## 2018-04-15 ENCOUNTER — Inpatient Hospital Stay (HOSPITAL_COMMUNITY)
Admission: EM | Admit: 2018-04-15 | Discharge: 2018-04-24 | DRG: 481 | Disposition: A | Discharge status: Rehabilitation Facility | Payer: Medicare Other | Attending: Internal Medicine | Admitting: Internal Medicine

## 2018-04-15 DIAGNOSIS — H9191 Unspecified hearing loss, right ear: Secondary | ICD-10-CM | POA: Diagnosis present

## 2018-04-15 DIAGNOSIS — T84115A Breakdown (mechanical) of internal fixation device of left femur, initial encounter: Secondary | ICD-10-CM | POA: Diagnosis present

## 2018-04-15 DIAGNOSIS — K5900 Constipation, unspecified: Secondary | ICD-10-CM | POA: Diagnosis not present

## 2018-04-15 DIAGNOSIS — K08109 Complete loss of teeth, unspecified cause, unspecified class: Secondary | ICD-10-CM | POA: Diagnosis present

## 2018-04-15 DIAGNOSIS — Z8521 Personal history of malignant neoplasm of larynx: Secondary | ICD-10-CM | POA: Diagnosis not present

## 2018-04-15 DIAGNOSIS — S7222XS Displaced subtrochanteric fracture of left femur, sequela: Secondary | ICD-10-CM | POA: Diagnosis not present

## 2018-04-15 DIAGNOSIS — D509 Iron deficiency anemia, unspecified: Secondary | ICD-10-CM | POA: Diagnosis present

## 2018-04-15 DIAGNOSIS — I951 Orthostatic hypotension: Secondary | ICD-10-CM | POA: Diagnosis not present

## 2018-04-15 DIAGNOSIS — Z8501 Personal history of malignant neoplasm of esophagus: Secondary | ICD-10-CM

## 2018-04-15 DIAGNOSIS — I110 Hypertensive heart disease with heart failure: Secondary | ICD-10-CM | POA: Diagnosis present

## 2018-04-15 DIAGNOSIS — I5032 Chronic diastolic (congestive) heart failure: Secondary | ICD-10-CM | POA: Diagnosis present

## 2018-04-15 DIAGNOSIS — I361 Nonrheumatic tricuspid (valve) insufficiency: Secondary | ICD-10-CM

## 2018-04-15 DIAGNOSIS — Z9181 History of falling: Secondary | ICD-10-CM

## 2018-04-15 DIAGNOSIS — Y792 Prosthetic and other implants, materials and accessory orthopedic devices associated with adverse incidents: Secondary | ICD-10-CM | POA: Diagnosis present

## 2018-04-15 DIAGNOSIS — Z8781 Personal history of (healed) traumatic fracture: Secondary | ICD-10-CM

## 2018-04-15 DIAGNOSIS — R000 Tachycardia, unspecified: Secondary | ICD-10-CM | POA: Diagnosis not present

## 2018-04-15 DIAGNOSIS — Z7984 Long term (current) use of oral hypoglycemic drugs: Secondary | ICD-10-CM

## 2018-04-15 DIAGNOSIS — Z79899 Other long term (current) drug therapy: Secondary | ICD-10-CM

## 2018-04-15 DIAGNOSIS — Y92003 Bedroom of unspecified non-institutional (private) residence as the place of occurrence of the external cause: Secondary | ICD-10-CM | POA: Diagnosis not present

## 2018-04-15 DIAGNOSIS — T84011A Broken internal left hip prosthesis, initial encounter: Secondary | ICD-10-CM | POA: Diagnosis not present

## 2018-04-15 DIAGNOSIS — Z8679 Personal history of other diseases of the circulatory system: Secondary | ICD-10-CM

## 2018-04-15 DIAGNOSIS — W06XXXA Fall from bed, initial encounter: Secondary | ICD-10-CM | POA: Diagnosis present

## 2018-04-15 DIAGNOSIS — Z981 Arthrodesis status: Secondary | ICD-10-CM

## 2018-04-15 DIAGNOSIS — D519 Vitamin B12 deficiency anemia, unspecified: Secondary | ICD-10-CM | POA: Diagnosis not present

## 2018-04-15 DIAGNOSIS — M25462 Effusion, left knee: Secondary | ICD-10-CM | POA: Diagnosis not present

## 2018-04-15 DIAGNOSIS — Z87891 Personal history of nicotine dependence: Secondary | ICD-10-CM | POA: Diagnosis not present

## 2018-04-15 DIAGNOSIS — S7222XA Displaced subtrochanteric fracture of left femur, initial encounter for closed fracture: Secondary | ICD-10-CM | POA: Diagnosis not present

## 2018-04-15 DIAGNOSIS — E119 Type 2 diabetes mellitus without complications: Secondary | ICD-10-CM | POA: Diagnosis not present

## 2018-04-15 DIAGNOSIS — Z953 Presence of xenogenic heart valve: Secondary | ICD-10-CM | POA: Diagnosis not present

## 2018-04-15 DIAGNOSIS — Z96652 Presence of left artificial knee joint: Secondary | ICD-10-CM | POA: Diagnosis present

## 2018-04-15 DIAGNOSIS — Z96641 Presence of right artificial hip joint: Secondary | ICD-10-CM | POA: Diagnosis present

## 2018-04-15 DIAGNOSIS — I252 Old myocardial infarction: Secondary | ICD-10-CM

## 2018-04-15 DIAGNOSIS — E785 Hyperlipidemia, unspecified: Secondary | ICD-10-CM | POA: Diagnosis present

## 2018-04-15 DIAGNOSIS — R471 Dysarthria and anarthria: Secondary | ICD-10-CM | POA: Diagnosis present

## 2018-04-15 DIAGNOSIS — I10 Essential (primary) hypertension: Secondary | ICD-10-CM | POA: Diagnosis not present

## 2018-04-15 DIAGNOSIS — Z23 Encounter for immunization: Secondary | ICD-10-CM

## 2018-04-15 DIAGNOSIS — S72002A Fracture of unspecified part of neck of left femur, initial encounter for closed fracture: Secondary | ICD-10-CM

## 2018-04-15 DIAGNOSIS — M19012 Primary osteoarthritis, left shoulder: Secondary | ICD-10-CM | POA: Diagnosis present

## 2018-04-15 DIAGNOSIS — D62 Acute posthemorrhagic anemia: Secondary | ICD-10-CM | POA: Diagnosis not present

## 2018-04-15 DIAGNOSIS — I350 Nonrheumatic aortic (valve) stenosis: Secondary | ICD-10-CM | POA: Diagnosis not present

## 2018-04-15 DIAGNOSIS — K5903 Drug induced constipation: Secondary | ICD-10-CM | POA: Diagnosis not present

## 2018-04-15 DIAGNOSIS — S72302A Unspecified fracture of shaft of left femur, initial encounter for closed fracture: Secondary | ICD-10-CM | POA: Diagnosis not present

## 2018-04-15 DIAGNOSIS — G8918 Other acute postprocedural pain: Secondary | ICD-10-CM | POA: Diagnosis not present

## 2018-04-15 DIAGNOSIS — S7225XS Nondisplaced subtrochanteric fracture of left femur, sequela: Secondary | ICD-10-CM | POA: Diagnosis not present

## 2018-04-15 DIAGNOSIS — Z952 Presence of prosthetic heart valve: Secondary | ICD-10-CM | POA: Diagnosis not present

## 2018-04-15 DIAGNOSIS — I4510 Unspecified right bundle-branch block: Secondary | ICD-10-CM | POA: Diagnosis present

## 2018-04-15 DIAGNOSIS — E46 Unspecified protein-calorie malnutrition: Secondary | ICD-10-CM | POA: Diagnosis not present

## 2018-04-15 DIAGNOSIS — M1990 Unspecified osteoarthritis, unspecified site: Secondary | ICD-10-CM | POA: Diagnosis not present

## 2018-04-15 DIAGNOSIS — D649 Anemia, unspecified: Secondary | ICD-10-CM | POA: Diagnosis not present

## 2018-04-15 DIAGNOSIS — S7222XD Displaced subtrochanteric fracture of left femur, subsequent encounter for closed fracture with routine healing: Secondary | ICD-10-CM | POA: Diagnosis not present

## 2018-04-15 DIAGNOSIS — S72352A Displaced comminuted fracture of shaft of left femur, initial encounter for closed fracture: Secondary | ICD-10-CM | POA: Diagnosis not present

## 2018-04-15 DIAGNOSIS — T148XXA Other injury of unspecified body region, initial encounter: Secondary | ICD-10-CM

## 2018-04-15 DIAGNOSIS — S72002D Fracture of unspecified part of neck of left femur, subsequent encounter for closed fracture with routine healing: Secondary | ICD-10-CM | POA: Diagnosis not present

## 2018-04-15 DIAGNOSIS — D638 Anemia in other chronic diseases classified elsewhere: Secondary | ICD-10-CM | POA: Diagnosis not present

## 2018-04-15 DIAGNOSIS — S7290XA Unspecified fracture of unspecified femur, initial encounter for closed fracture: Secondary | ICD-10-CM

## 2018-04-15 DIAGNOSIS — E8809 Other disorders of plasma-protein metabolism, not elsewhere classified: Secondary | ICD-10-CM | POA: Diagnosis not present

## 2018-04-15 DIAGNOSIS — S299XXA Unspecified injury of thorax, initial encounter: Secondary | ICD-10-CM | POA: Diagnosis not present

## 2018-04-15 DIAGNOSIS — Z8719 Personal history of other diseases of the digestive system: Secondary | ICD-10-CM | POA: Diagnosis not present

## 2018-04-15 DIAGNOSIS — S72009A Fracture of unspecified part of neck of unspecified femur, initial encounter for closed fracture: Secondary | ICD-10-CM | POA: Diagnosis present

## 2018-04-15 DIAGNOSIS — E8889 Other specified metabolic disorders: Secondary | ICD-10-CM | POA: Diagnosis present

## 2018-04-15 DIAGNOSIS — K449 Diaphragmatic hernia without obstruction or gangrene: Secondary | ICD-10-CM | POA: Diagnosis not present

## 2018-04-15 DIAGNOSIS — J449 Chronic obstructive pulmonary disease, unspecified: Secondary | ICD-10-CM | POA: Diagnosis present

## 2018-04-15 DIAGNOSIS — Z01810 Encounter for preprocedural cardiovascular examination: Secondary | ICD-10-CM | POA: Diagnosis not present

## 2018-04-15 DIAGNOSIS — M7989 Other specified soft tissue disorders: Secondary | ICD-10-CM | POA: Diagnosis not present

## 2018-04-15 DIAGNOSIS — K219 Gastro-esophageal reflux disease without esophagitis: Secondary | ICD-10-CM | POA: Diagnosis not present

## 2018-04-15 DIAGNOSIS — G894 Chronic pain syndrome: Secondary | ICD-10-CM | POA: Diagnosis not present

## 2018-04-15 LAB — TYPE AND SCREEN
ABO/RH(D): O POS
ANTIBODY SCREEN: NEGATIVE

## 2018-04-15 LAB — CBC WITH DIFFERENTIAL/PLATELET
Basophils Absolute: 0.1 10*3/uL (ref 0.0–0.1)
Basophils Relative: 1 %
Eosinophils Absolute: 0 10*3/uL (ref 0.0–0.7)
Eosinophils Relative: 0 %
HCT: 29.5 % — ABNORMAL LOW (ref 39.0–52.0)
HEMOGLOBIN: 8.8 g/dL — AB (ref 13.0–17.0)
LYMPHS ABS: 1.8 10*3/uL (ref 0.7–4.0)
LYMPHS PCT: 17 %
MCH: 22.9 pg — AB (ref 26.0–34.0)
MCHC: 29.8 g/dL — ABNORMAL LOW (ref 30.0–36.0)
MCV: 76.8 fL — AB (ref 78.0–100.0)
Monocytes Absolute: 0.9 10*3/uL (ref 0.1–1.0)
Monocytes Relative: 9 %
NEUTROS PCT: 73 %
Neutro Abs: 7.7 10*3/uL (ref 1.7–7.7)
Platelets: 290 10*3/uL (ref 150–400)
RBC: 3.84 MIL/uL — ABNORMAL LOW (ref 4.22–5.81)
RDW: 18 % — ABNORMAL HIGH (ref 11.5–15.5)
WBC: 10.4 10*3/uL (ref 4.0–10.5)

## 2018-04-15 LAB — BASIC METABOLIC PANEL
Anion gap: 12 (ref 5–15)
BUN: 16 mg/dL (ref 6–20)
CHLORIDE: 106 mmol/L (ref 101–111)
CO2: 23 mmol/L (ref 22–32)
Calcium: 9 mg/dL (ref 8.9–10.3)
Creatinine, Ser: 0.99 mg/dL (ref 0.61–1.24)
GFR calc non Af Amer: >60 mL/min (ref >60)
Glucose, Bld: 80 mg/dL (ref 65–99)
POTASSIUM: 4.2 mmol/L (ref 3.5–5.1)
SODIUM: 141 mmol/L (ref 135–145)

## 2018-04-15 LAB — PROTIME-INR
INR: 1.02
PROTHROMBIN TIME: 13.3 s (ref 11.4–15.2)

## 2018-04-15 LAB — GLUCOSE, CAPILLARY: Glucose-Capillary: 137 mg/dL — ABNORMAL HIGH (ref 65–99)

## 2018-04-15 MED ORDER — PANTOPRAZOLE SODIUM 40 MG PO TBEC
40.0000 mg | DELAYED_RELEASE_TABLET | Freq: Every day | ORAL | Status: DC
  Administered 2018-04-16 – 2018-04-24 (×8): 40 mg via ORAL
  Filled 2018-04-15 (×8): qty 1

## 2018-04-15 MED ORDER — VITAMIN B-12 1000 MCG PO TABS
4000.0000 ug | ORAL_TABLET | Freq: Every day | ORAL | Status: DC
  Administered 2018-04-16 – 2018-04-24 (×8): 4000 ug via ORAL
  Filled 2018-04-15 (×8): qty 4

## 2018-04-15 MED ORDER — SORBITOL 70 % SOLN
30.0000 mL | Freq: Every day | Status: DC | PRN
  Filled 2018-04-15: qty 30

## 2018-04-15 MED ORDER — FENTANYL CITRATE (PF) 100 MCG/2ML IJ SOLN
50.0000 ug | Freq: Once | INTRAMUSCULAR | Status: AC
  Administered 2018-04-15: 50 ug via INTRAVENOUS
  Filled 2018-04-15: qty 2

## 2018-04-15 MED ORDER — MORPHINE SULFATE (PF) 4 MG/ML IV SOLN
4.0000 mg | Freq: Once | INTRAVENOUS | Status: AC
  Administered 2018-04-15: 4 mg via INTRAVENOUS
  Filled 2018-04-15: qty 1

## 2018-04-15 MED ORDER — MORPHINE SULFATE (PF) 2 MG/ML IV SOLN: 0.5000 mg | INTRAVENOUS | Status: DC | PRN

## 2018-04-15 MED ORDER — VITAMIN D (ERGOCALCIFEROL) 1.25 MG (50000 UNIT) PO CAPS
50000.0000 [IU] | ORAL_CAPSULE | ORAL | Status: DC
  Administered 2018-04-16 – 2018-04-23 (×2): 50000 [IU] via ORAL
  Filled 2018-04-15 (×2): qty 1

## 2018-04-15 MED ORDER — HYDROCODONE-ACETAMINOPHEN 10-325 MG PO TABS
1.0000 | ORAL_TABLET | Freq: Four times a day (QID) | ORAL | Status: DC | PRN
  Administered 2018-04-15 – 2018-04-16 (×3): 2 via ORAL
  Filled 2018-04-15 (×3): qty 2

## 2018-04-15 MED ORDER — ENOXAPARIN SODIUM 30 MG/0.3ML ~~LOC~~ SOLN
30.0000 mg | SUBCUTANEOUS | Status: DC
  Administered 2018-04-15 – 2018-04-16 (×2): 30 mg via SUBCUTANEOUS
  Filled 2018-04-15 (×2): qty 0.3

## 2018-04-15 MED ORDER — PERFLUTREN LIPID MICROSPHERE
1.0000 mL | INTRAVENOUS | Status: AC | PRN
  Administered 2018-04-16: 4 mL via INTRAVENOUS
  Filled 2018-04-15: qty 10

## 2018-04-15 MED ORDER — ONDANSETRON HCL 4 MG PO TABS
4.0000 mg | ORAL_TABLET | Freq: Three times a day (TID) | ORAL | Status: DC | PRN
  Filled 2018-04-15: qty 1

## 2018-04-15 MED ORDER — FERROUS SULFATE 325 (65 FE) MG PO TABS
325.0000 mg | ORAL_TABLET | Freq: Two times a day (BID) | ORAL | Status: DC
  Administered 2018-04-16 – 2018-04-24 (×16): 325 mg via ORAL
  Filled 2018-04-15 (×16): qty 1

## 2018-04-15 MED ORDER — MORPHINE SULFATE (PF) 2 MG/ML IV SOLN
2.0000 mg | INTRAVENOUS | Status: DC | PRN
  Administered 2018-04-15 – 2018-04-18 (×11): 2 mg via INTRAVENOUS
  Filled 2018-04-15 (×11): qty 1

## 2018-04-15 NOTE — ED Notes (Signed)
Bed: KD32 Expected date: 04/15/18 Expected time:  Means of arrival:  Comments: [place] out of bed, ? Hip injury

## 2018-04-15 NOTE — ED Triage Notes (Signed)
EMS reports from home, fell out of bed on left side, prior hip/leg rod since 1950. Visible displacement of hip/femur.  BP 142/78 HR 96 Resp 18 Sp02 94 RA CBG 90

## 2018-04-15 NOTE — Consult Note (Signed)
Cardiology Consultation   Patient ID: Casey Gilmore; [id number]; Oct 21, 1941   Admit date: 04/15/2018 Date of Consult: 04/15/2018  Referring Provider:  Nita Sells  Primary Care Provider: Mayra Neer, MD Cardiologist: Fransico Him  Reason for Consultation: preop risk stratification  History of Present Illness: Casey Gilmore is a 77 y.o. 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 Verlon Au. Pt has h/o severe AS and s/p AVR (biopros) done in 2012. Most recent TTE was back in 2013, and at that time the valve was functioning normally. Pt says he has not been having any new or worsening CP or SOB/DOE, however his functional status is poor due to longstanding COPD. He has HTN which appears to be well-controlled. 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 He is diabetic and has dyslipidemia; both of these issues are stable. He is not currently on long-term statin therapy.  Past Medical History:  Diagnosis Date  . Anemia    2012  . Arthritis   . Blood transfusion   . Cancer ([place])    VOCAL CORD  . Closed fracture of left distal radius and ulna 01/19/2018  . Closed fracture of left proximal humerus 01/19/2018  . COPD (chronic obstructive pulmonary disease) ([place])   . Diabetes mellitus   . GERD (gastroesophageal reflux disease)   . Heart murmur   . Hiatal hernia   . Hyperlipidemia   . Hypertension   . NSTEMI (non-ST elevated myocardial infarction) ([place]) 07/2011   in setting of Pneumonia with normal coronary arteries on cath  . Pneumonia hx 8/12  . Severe aortic stenosis 2012   s/p bioprosthetic AVR  . Shortness of breath    with exertion  . Upper GI bleeding     Past Surgical History:  Procedure Laterality Date  . AORTIC VALVE REPLACEMENT  10/15/2011   Procedure: AORTIC VALVE REPLACEMENT (AVR);  Surgeon: Tharon Aquas Adelene Idler, MD;  Location: [place];  Service: Open Heart Surgery;  Laterality: N/A;  . CARDIAC CATHETERIZATION  results on chart   2012 w no significant disease,echo with Severe Aortic stenosis,LVH,Diastolic dysfunction.   . COLONOSCOPY  12/2004  . EYE SURGERY  bil cat 08  . FRACTURE SURGERY  pin in lft hip  . JOINT REPLACEMENT  left knee 08, rt hip 08  . LARYNGOSCOPY  01/05/2013   Procedure: LARYNGOSCOPY;  Surgeon: Melida Quitter, MD;  Location: [place];  Service: ENT;  Laterality: N/A;  micro direct laryngoscopy with biopsy, possible co2 laser, small laser-safe endotracheal tube  . LARYNGOSCOPY N/A 02/08/2013   Procedure: LARYNGOSCOPY;  Surgeon: Melida Quitter, MD;  Location: [place];  Service: ENT;  Laterality: N/A;  Micro Direct Laryngoscopy with Co2 laser with re-excision right vocal cord lesion.  . OPEN REDUCTION INTERNAL FIXATION (ORIF) DISTAL RADIAL FRACTURE Left 01/19/2018   Procedure: OPEN REDUCTION INTERNAL FIXATION (ORIF) DISTAL RADIAL FRACTURE;  Surgeon: Marchia Bond, MD;  Location: [place];  Service: Orthopedics;  Laterality: Left;  . ORIF HUMERUS FRACTURE Left 01/19/2018   Procedure: OPEN REDUCTION INTERNAL FIXATION (ORIF) PROXIMAL HUMERUS FRACTURE;  Surgeon: Marchia Bond, MD;  Location: [place];  Service: Orthopedics;  Laterality: Left;  . TONSILLECTOMY    . TOTAL HIP ARTHROPLASTY Right   . TOTAL KNEE ARTHROPLASTY Left   . UPPER GASTROINTESTINAL ENDOSCOPY  Hiatal Hernia,small AVM 12/2004 Gastroduod AVM obliteration      Current Medications: . enoxaparin (LOVENOX) injection  30 mg Subcutaneous Q24H  . ferrous sulfate  325 mg Oral BID WC  . pantoprazole  40 mg Oral Daily  . cyanocobalamin  4,000 mcg Oral Daily  . [START ON 04/16/2018] Vitamin D (Ergocalciferol)  50,000 Units Oral Q7 days     Current Facility-Administered Medications:  .  enoxaparin (LOVENOX) injection 30 mg, 30 mg, Subcutaneous, Q24H, Samtani, Jai-Gurmukh,  MD, 30 mg at 04/15/18 2000 .  ferrous sulfate tablet 325 mg, 325 mg, Oral, BID WC, Samtani, Jai-Gurmukh, MD .  HYDROcodone-acetaminophen (NORCO) 10-325 MG per tablet 1-2 tablet, 1-2 tablet, Oral, Q6H PRN, Nita Sells, MD, 2 tablet at 04/15/18 2205 .  morphine 2 MG/ML injection 2 mg, 2 mg, Intravenous, Q3H PRN, Nita Sells, MD, 2 mg at 04/15/18 2000 .  ondansetron (ZOFRAN) tablet 4 mg, 4 mg, Oral, Q8H PRN, Samtani, Jai-Gurmukh, MD .  pantoprazole (PROTONIX) EC tablet 40 mg, 40 mg, Oral, Daily, Samtani, Jai-Gurmukh, MD .  sorbitol 70 % solution 30 mL, 30 mL, Oral, Daily PRN, Verlon Au, Jai-Gurmukh, MD .  vitamin B-12 (CYANOCOBALAMIN) tablet 4,000 mcg, 4,000 mcg, Oral, Daily, Samtani, Jai-Gurmukh, MD .  Vitamin D (Ergocalciferol) (DRISDOL) capsule 50,000 Units, 50,000 Units, Oral, Q7 days, Nita Sells, MD  Infused Medications:   PRN Medications: HYDROcodone-acetaminophen, morphine injection, ondansetron, sorbitol   Allergies:   No Known Allergies  Social History:   The patient  reports that he quit smoking about 39 years ago. His smoking use included cigarettes. He has a 50.00 pack-year smoking history. He has never used smokeless tobacco. He reports that he does not drink alcohol or use drugs.    Family History:   The patient's family history is not on file.  FHx non-contributory   ROS:  Please see the history of present illness.  All other ROS reviewed and negative.     Vital Signs: Blood pressure (!) 143/90, pulse 81, temperature 98.6 F (37 C), temperature source Oral, resp. rate 18, height 6' (1.829 m), weight 79.4 kg (175 lb), SpO2 98 %.   PHYSICAL EXAM: General:  Well nourished, well developed, in no acute distress HEENT: normal Lymph: no adenopathy Neck: no JVD Endocrine:  No thryomegaly Vascular: No carotid bruits; DP pulses 2+ bilaterally  Cardiac:  normal S1, S2; RRR; no murmur  Lungs:  clear to auscultation bilaterally, no wheezing, rhonchi or  rales  Abd: soft, nontender, no hepatomegaly  Ext: no edema Musculoskeletal:  L hip is injured; no other obvious deformities. Pt is not ambulatory  Skin: warm and dry  Neuro:  CNs 2-12 intact, no focal abnormalities noted Psych:  Normal affect   EKG:  04-15-18 NSR, RBBB  Labs: No results for input(s): CKTOTAL, CKMB, TROPONINI in the last 72 hours. No results for input(s): TROPIPOC in the last 72 hours.  Lab Results  Component Value Date   WBC 10.4 04/15/2018   HGB 8.8 (L) 04/15/2018   HCT 29.5 (L) 04/15/2018   MCV 76.8 (L) 04/15/2018   PLT 290 04/15/2018   Recent Labs  Lab 04/15/18 1050  NA 141  K 4.2  CL 106  CO2 23  BUN 16  CREATININE 0.99  CALCIUM 9.0  GLUCOSE 80   Lab Results  Component Value Date   CHOL 84 07/16/2011   HDL 25 (L) 07/16/2011   LDLCALC 36 07/16/2011   TRIG 113 07/16/2011   Lab Results  Component Value Date  DDIMER 1.62 (H) 08/03/2011    Radiology/Studies:  Dg Chest 1 View  Result Date: 04/15/2018 CLINICAL DATA:  Status post fall today EXAM: CHEST  1 VIEW COMPARISON:  December 29, 2012 FINDINGS: The heart size and mediastinal contours are within normal limits. Both lungs are clear. The visualized skeletal structures are unremarkable. IMPRESSION: No active cardiopulmonary disease. Electronically Signed   By: Abelardo Diesel M.D.   On: 04/15/2018 12:19   Dg Hip Unilat With Pelvis 2-3 Views Left  Result Date: 04/15/2018 CLINICAL DATA:  Status post fall with hip pain. EXAM: DG HIP (WITH OR WITHOUT PELVIS) 2-3V LEFT COMPARISON:  January 15, 2018 FINDINGS: There is displaced fracture of the left proximal femoral shaft with fracture extending through the metallic hardware of the proximal shaft. Total right hip replacement is identified. IMPRESSION: Displaced fracture of the left proximal femoral shaft with fracture extending through the metallic overlying hardware of the proximal shaft. Electronically Signed   By: Abelardo Diesel M.D.   On: 04/15/2018 12:19    TTE 12-23-11 nl LV sz, EF 60-65%, mildly dilated LA, biopros AV functioning normally  LHC 2012--no sig disease  ASSESSMENT AND PLAN:  1. Preop: repeat TTE is pending for evaluation of the prosthetic AV. Full recs to follow echo results. Pt has no prior h/o CAD but last ischemia eval was 2012 and he has poor functional status. Will let primary consulting cardiology team make recommendation as to whether to proceed w/ repeat ischemia eval once repeat TTE results are known  2. H/o AS, s/p AVR: as above.   3. HTN: cont home regimen w/ adjustments as needed  4. COPD/DM2: mgmt as per medicine   Thank you for the opportunity to participate in the care of this patient. Will follow. Please call w/ questions.   Rudean Curt, MD , [facility] 04/15/2018 11:52 PM

## 2018-04-15 NOTE — ED Notes (Signed)
ED TO INPATIENT HANDOFF REPORT  Name/Age/Gender Casey Gilmore 77 y.o. male  Code Status    Code Status Orders  (From admission, onward)        Start     Ordered   04/15/18 1445  Full code  Continuous     04/15/18 1446    Code Status History    Date Active Date Inactive Code Status Order ID Comments User Context   01/19/2018 2021 01/22/2018 2208 Full Code [id number]  Marchia Bond, MD Inpatient      Home/SNF/Other Home  Chief Complaint fall  Level of Care/Admitting Diagnosis ED Disposition    ED Disposition Condition [place]: [place] [100100]  Level of Care: Telemetry [5]  Diagnosis: Closed left hip fracture [facility]) [488891]  Admitting Physician: Nita Sells [phone]  Attending Physician: Nita Sells [phone]  Estimated length of stay: 3 - 4 days  Certification:: I certify this patient will need inpatient services for at least 2 midnights  PT Class (Do Not Modify): Inpatient [101]  PT Acc Code (Do Not Modify): Private [1]       Medical History Past Medical History:  Diagnosis Date  . Anemia    2012  . Arthritis   . Blood transfusion   . Cancer ([place])    VOCAL CORD  . Closed fracture of left distal radius and ulna 01/19/2018  . Closed fracture of left proximal humerus 01/19/2018  . COPD (chronic obstructive pulmonary disease) ([place])   . Diabetes mellitus   . GERD (gastroesophageal reflux disease)   . Heart murmur   . Hiatal hernia   . Hyperlipidemia   . Hypertension   . NSTEMI (non-ST elevated myocardial infarction) ([place]) 07/2011   in setting of Pneumonia with normal coronary arteries on cath  . Pneumonia hx 8/12  . Severe aortic stenosis 2012   s/p bioprosthetic AVR  . Shortness of breath    with exertion  . Upper GI bleeding     Allergies No Known Allergies  IV Location/Drains/Wounds Patient Lines/Drains/Airways Status   Active Line/Drains/Airways    Name:   Placement date:    Placement time:   Site:   Days:   Peripheral IV 04/15/18 Right;Lateral;Distal Forearm   04/15/18    1124    Forearm   less than 1          Labs/Imaging Results for orders placed or performed during the hospital encounter of 04/15/18 (from the past 48 hour(s))  Basic metabolic panel     Status: None   Collection Time: 04/15/18 10:50 AM  Result Value Ref Range   Sodium 141 135 - 145 mmol/L   Potassium 4.2 3.5 - 5.1 mmol/L   Chloride 106 101 - 111 mmol/L   CO2 23 22 - 32 mmol/L   Glucose, Bld 80 65 - 99 mg/dL   BUN 16 6 - 20 mg/dL   Creatinine, Ser 0.99 0.61 - 1.24 mg/dL   Calcium 9.0 8.9 - 10.3 mg/dL   GFR calc non Af Amer >60 >60 mL/min   GFR calc Af Amer >60 >60 mL/min    Comment: (NOTE) The eGFR has been calculated using the CKD EPI equation. This calculation has not been validated in all clinical situations. eGFR's persistently <60 mL/min signify possible Chronic Kidney Disease.    Anion gap 12 5 - 15    Comment: Performed at [facility], [place] [address], [place], [place] [address]  CBC WITH DIFFERENTIAL  Status: Abnormal   Collection Time: 04/15/18 10:50 AM  Result Value Ref Range   WBC 10.4 4.0 - 10.5 K/uL   RBC 3.84 (L) 4.22 - 5.81 MIL/uL   Hemoglobin 8.8 (L) 13.0 - 17.0 g/dL   HCT 29.5 (L) 39.0 - 52.0 %   MCV 76.8 (L) 78.0 - 100.0 fL   MCH 22.9 (L) 26.0 - 34.0 pg   MCHC 29.8 (L) 30.0 - 36.0 g/dL   RDW 18.0 (H) 11.5 - 15.5 %   Platelets 290 150 - 400 K/uL   Neutrophils Relative % 73 %   Neutro Abs 7.7 1.7 - 7.7 K/uL   Lymphocytes Relative 17 %   Lymphs Abs 1.8 0.7 - 4.0 K/uL   Monocytes Relative 9 %   Monocytes Absolute 0.9 0.1 - 1.0 K/uL   Eosinophils Relative 0 %   Eosinophils Absolute 0.0 0.0 - 0.7 K/uL   Basophils Relative 1 %   Basophils Absolute 0.1 0.0 - 0.1 K/uL    Comment: Performed at [facility], [place] [address] [place], [place] [address]  Protime-INR     Status: None   Collection Time: 04/15/18 10:50 AM   Result Value Ref Range   Prothrombin Time 13.3 11.4 - 15.2 seconds   INR 1.02     Comment: Performed at [facility], [place] [address], [place], [place] [address]  Type and screen [place]     Status: None   Collection Time: 04/15/18 10:50 AM  Result Value Ref Range   ABO/RH(D) O POS    Antibody Screen NEG    Sample Expiration      04/18/2018 Performed at [facility], [place] [address], [place], [place] [address]    Dg Chest 1 View  Result Date: 04/15/2018 CLINICAL DATA:  Status post fall today EXAM: CHEST  1 VIEW COMPARISON:  December 29, 2012 FINDINGS: The heart size and mediastinal contours are within normal limits. Both lungs are clear. The visualized skeletal structures are unremarkable. IMPRESSION: No active cardiopulmonary disease. Electronically Signed   By: Abelardo Diesel M.D.   On: 04/15/2018 12:19   Dg Hip Unilat With Pelvis 2-3 Views Left  Result Date: 04/15/2018 CLINICAL DATA:  Status post fall with hip pain. EXAM: DG HIP (WITH OR WITHOUT PELVIS) 2-3V LEFT COMPARISON:  January 15, 2018 FINDINGS: There is displaced fracture of the left proximal femoral shaft with fracture extending through the metallic hardware of the proximal shaft. Total right hip replacement is identified. IMPRESSION: Displaced fracture of the left proximal femoral shaft with fracture extending through the metallic overlying hardware of the proximal shaft. Electronically Signed   By: Abelardo Diesel M.D.   On: 04/15/2018 12:19    Pending Labs Unresulted Labs (From admission, onward)   Start     Ordered   04/22/18 0500  Creatinine, serum  (enoxaparin (LOVENOX)    CrCl < 30 ml/min)  Weekly,   R    Comments:  while on enoxaparin therapy.    04/15/18 1446   04/16/18 0500  CBC  Tomorrow morning,   R     04/15/18 1446   04/16/18 5809  Basic metabolic panel  Tomorrow morning,   R     04/15/18 1446   04/15/18 1447  Vitamin B12  (Anemia Panel (PNL))   Add-on,   R     04/15/18 1446   04/15/18 1447  Folate  (Anemia Panel (PNL))  Add-on,   R  04/15/18 1446   04/15/18 1447  Iron and TIBC  (Anemia Panel (PNL))  Add-on,   R     04/15/18 1446   04/15/18 1447  Ferritin  (Anemia Panel (PNL))  Add-on,   R     04/15/18 1446   04/15/18 1447  Reticulocytes  (Anemia Panel (PNL))  Add-on,   R     04/15/18 1446   04/15/18 1445  CBC  (enoxaparin (LOVENOX)    CrCl < 30 ml/min)  Once,   R    Comments:  Baseline for enoxaparin therapy IF NOT ALREADY DRAWN.  Notify MD if PLT < 100 K.    04/15/18 1446   04/15/18 1445  Creatinine, serum  (enoxaparin (LOVENOX)    CrCl < 30 ml/min)  Once,   R    Comments:  Baseline for enoxaparin therapy IF NOT ALREADY DRAWN.    04/15/18 1446   04/15/18 1210  ABO/Rh  Once,   R     04/15/18 1210      Vitals/Pain Today's Vitals   04/15/18 1230 04/15/18 1300 04/15/18 1330 04/15/18 1400  BP: (!) 149/79 133/70 (!) 147/85 (!) 155/82  Pulse: 85 84 81 87  Resp: 14 18 17 16   Temp:      TempSrc:      SpO2: 96% 95% 96% 95%  Weight:      Height:      PainSc:        Isolation Precautions No active isolations  Medications Medications  HYDROcodone-acetaminophen (NORCO) 10-325 MG per tablet 1-2 tablet (has no administration in time range)  vitamin B-12 (CYANOCOBALAMIN) tablet 4,000 mcg (has no administration in time range)  pantoprazole (PROTONIX) EC tablet 40 mg (has no administration in time range)  ondansetron (ZOFRAN) tablet 4 mg (has no administration in time range)  Vitamin D (Ergocalciferol) (DRISDOL) capsule 50,000 Units (has no administration in time range)  morphine 2 MG/ML injection 2 mg (has no administration in time range)  enoxaparin (LOVENOX) injection 30 mg (has no administration in time range)  morphine 2 MG/ML injection 0.5 mg (has no administration in time range)  sorbitol 70 % solution 30 mL (has no administration in time range)  ferrous sulfate tablet 325 mg (has no administration in time range)   fentaNYL (SUBLIMAZE) injection 50 mcg (50 mcg Intravenous Given 04/15/18 1125)  morphine 4 MG/ML injection 4 mg (4 mg Intravenous Given 04/15/18 1249)    Mobility non-ambulatory

## 2018-04-15 NOTE — ED Provider Notes (Signed)
[facility] DEPT Provider Note   CSN: [id number] Arrival date & time: 04/15/18  1019     History   Chief Complaint Chief Complaint  Patient presents with  . Fall  . Hip Pain    HPI Casey Gilmore is a 77 y.o. male.  HPI Patient presents after falling out of bed this morning.  States she was half awake and fell onto his left side.  Complaining of pain in his left hip.  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  Has seen the [facility] in the past.  No other extremity head neck or abdominal pain.  Not on anticoagulation.  No loss consciousness. Past Medical History:  Diagnosis Date  . Anemia    2012  . Arthritis   . Blood transfusion   . 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 Closed fracture of left distal radius and ulna 01/19/2018  . Closed fracture of left proximal humerus 01/19/2018  . COPD (chronic obstructive pulmonary disease) ([place])   . Diabetes mellitus   . GERD (gastroesophageal reflux disease)   . Heart murmur   . Hiatal hernia   . Hyperlipidemia   . Hypertension   . NSTEMI (non-ST elevated myocardial infarction) ([place]) 07/2011   in setting of Pneumonia with normal coronary arteries on cath  . Pneumonia hx 8/12  . Severe aortic stenosis 2012   s/p bioprosthetic AVR  . Shortness of breath    with exertion  . Upper GI bleeding     Patient Active Problem List   Diagnosis Date Noted  . Closed fracture of left proximal humerus 01/19/2018  . Closed fracture of left distal radius and ulna 01/19/2018  . 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 COPD (chronic obstructive pulmonary disease) with chronic bronchitis ([place]) 10/29/2014  . Chronic diastolic heart failure ([place]) 06/24/2014  . Benign essential HTN 06/24/2014  . 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 History of non-ST elevation myocardial infarction (NSTEMI) 06/24/2014  . Aortic stenosis 10/17/2011  . Diabetes mellitus ([place]) 10/17/2011  .  Osteoarthritis 10/17/2011    Past Surgical History:  Procedure Laterality Date  . AORTIC VALVE REPLACEMENT  10/15/2011   Procedure: AORTIC VALVE REPLACEMENT (AVR);  Surgeon: Tharon Aquas Adelene Idler, MD;  Location: [facility];  Service: Open Heart Surgery;  Laterality: N/A;  . CARDIAC CATHETERIZATION  results on chart   2012 w no significant disease,echo with Severe Aortic stenosis,LVH,Diastolic dysfunction.   . COLONOSCOPY  12/2004  . EYE SURGERY  bil cat 08  . FRACTURE SURGERY  pin in lft hip  . JOINT REPLACEMENT  left knee 08, rt hip 08  . LARYNGOSCOPY  01/05/2013   Procedure: LARYNGOSCOPY;  Surgeon: Melida Quitter, MD;  Location: [facility];  Service: ENT;  Laterality: N/A;  micro direct laryngoscopy with biopsy, possible co2 laser, small laser-safe endotracheal tube  . LARYNGOSCOPY N/A 02/08/2013   Procedure: LARYNGOSCOPY;  Surgeon: Melida Quitter, MD;  Location: [facility];  Service: ENT;  Laterality: N/A;  Micro Direct Laryngoscopy with Co2 laser with re-excision right vocal cord lesion.  . OPEN REDUCTION INTERNAL FIXATION (ORIF) DISTAL RADIAL FRACTURE Left 01/19/2018   Procedure: OPEN REDUCTION INTERNAL FIXATION (ORIF) DISTAL RADIAL FRACTURE;  Surgeon: Marchia Bond, MD;  Location: [facility];  Service: Orthopedics;  Laterality: Left;  . ORIF HUMERUS FRACTURE Left 01/19/2018   Procedure: OPEN REDUCTION INTERNAL FIXATION (ORIF) PROXIMAL HUMERUS FRACTURE;  Surgeon: Marchia Bond, MD;  Location: [facility];  Service:  Orthopedics;  Laterality: Left;  . TONSILLECTOMY    . TOTAL HIP ARTHROPLASTY Right   . TOTAL KNEE ARTHROPLASTY Left   . 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 Authorizing Provider  cyanocobalamin 2000 MCG tablet Take 4,000 mcg by mouth daily.     [provider]  furosemide (LASIX) 40 MG tablet Take 40 mg by mouth daily.     [provider]  glimepiride (AMARYL) 4 MG tablet Take 4 mg by mouth daily before breakfast.     [provider]  HYDROcodone-acetaminophen (NORCO) 10-325 MG tablet Take 1 tablet by mouth every 6 (six) hours as needed. 01/19/18   Marchia Bond, MD  metFORMIN (GLUCOPHAGE) 1000 MG tablet Take 1,000 mg by mouth 2 (two) times daily with a meal.    [provider]  omeprazole (PRILOSEC) 40 MG capsule Take 40 mg by mouth daily.    [provider]  ondansetron (ZOFRAN) 4 MG tablet Take 1 tablet (4 mg total) by mouth every 8 (eight) hours as needed for nausea or vomiting. 01/19/18   Marchia Bond, MD  potassium chloride SA (K-DUR,KLOR-CON) 20 MEQ tablet Take 20 mEq by mouth daily.      [provider]  pravastatin (PRAVACHOL) 20 MG tablet Take 20 mg by mouth daily. 11/08/17   [provider]  PRESCRIPTION MEDICATION Inject 40 Units into the skin daily. Insulin Doctor is giving him samples of    [provider]    Family History History reviewed. No pertinent family history.  Social History Social History   Tobacco Use  . Smoking status: Former Smoker    Packs/day: 2.00    Years: 25.00    Pack years: 50.00    Types: Cigarettes    Last attempt to quit: 12/06/1978    Years since quitting: 39.3  . Smokeless tobacco: Never Used  Substance Use Topics  . Alcohol use: No  . Drug use: No     Allergies   Patient has no known allergies.   Review of Systems Review of Systems  Constitutional: Negative for appetite change.  HENT: Negative for congestion.   Respiratory: Negative for shortness of breath.   Cardiovascular: Negative for chest pain.  Gastrointestinal: Negative for abdominal pain.  Genitourinary: Negative for flank pain.  Musculoskeletal: Negative for neck pain.       Left hip pain.  Skin: Negative for wound.  Neurological: Negative for seizures.  Psychiatric/Behavioral: Negative for confusion.     Physical Exam Updated Vital  Signs BP (!) 149/79   Pulse 85   Temp 98.1 F (36.7 C) (Oral)   Resp 14   Ht 6' (1.829 m)   Wt 79.4 kg (175 lb)   SpO2 96%   BMI 23.73 kg/m   Physical Exam  Constitutional: He appears well-developed.  HENT:  Head: Atraumatic.  Eyes: EOM are normal.  Neck: Neck supple.  Cardiovascular: Normal rate.  Abdominal: There is no tenderness.  Musculoskeletal: He exhibits tenderness.  Tenderness proximal left long scar from previous surgery.  Left lower extremity shortened.  Neurological: He is alert.  Skin: Skin is warm. Capillary refill takes less than 2 seconds.     ED Treatments / Results  Labs (all labs ordered are listed, but only abnormal results are displayed) Labs Reviewed  CBC WITH DIFFERENTIAL/PLATELET -  Abnormal; Notable for the following components:      Result Value   RBC 3.84 (*)    Hemoglobin 8.8 (*)    HCT 29.5 (*)    MCV 76.8 (*)    MCH 22.9 (*)    MCHC 29.8 (*)    RDW 18.0 (*)    All other components within normal limits  BASIC METABOLIC PANEL  PROTIME-INR  TYPE AND SCREEN    EKG None  Radiology Dg Chest 1 View  Result Date: 04/15/2018 CLINICAL DATA:  Status post fall today EXAM: CHEST  1 VIEW COMPARISON:  December 29, 2012 FINDINGS: The heart size and mediastinal contours are within normal limits. Both lungs are clear. The visualized skeletal structures are unremarkable. IMPRESSION: No active cardiopulmonary disease. Electronically Signed   By: Abelardo Diesel M.D.   On: 04/15/2018 12:19   Dg Hip Unilat With Pelvis 2-3 Views Left  Result Date: 04/15/2018 CLINICAL DATA:  Status post fall with hip pain. EXAM: DG HIP (WITH OR WITHOUT PELVIS) 2-3V LEFT COMPARISON:  January 15, 2018 FINDINGS: There is displaced fracture of the left proximal femoral shaft with fracture extending through the metallic hardware of the proximal shaft. Total right hip replacement is identified. IMPRESSION: Displaced fracture of the left proximal femoral shaft with fracture  extending through the metallic overlying hardware of the proximal shaft. Electronically Signed   By: Abelardo Diesel M.D.   On: 04/15/2018 12:19    Procedures Procedures (including critical care time)  Medications Ordered in ED Medications  fentaNYL (SUBLIMAZE) injection 50 mcg (50 mcg Intravenous Given 04/15/18 1125)  morphine 4 MG/ML injection 4 mg (4 mg Intravenous Given 04/15/18 1249)     Initial Impression / Assessment and Plan / ED Course  I have reviewed the triage vital signs and the nursing notes.  Pertinent labs & imaging results that were available during my care of the patient were reviewed by me and considered in my medical decision making (see chart for details).     Patient with fall.  Has left hip fracture.  Previous fixed left hip is now chronically fused.  Broke both the hardware and the proximal femur.  Discussed with Dr. Ronnie Derby, who states the patient will likely have surgery by 1 of the traumatologist on Monday.  Will admit to internal medicine at [facility].  No other apparent injury.s  Final Clinical Impressions(s) / ED Diagnoses   Final diagnoses:  Closed fracture of left hip, initial encounter ([place])  Anemia, unspecified type    ED Discharge Orders    None       Davonna Belling, MD 04/15/18 1327

## 2018-04-15 NOTE — Progress Notes (Signed)
  Echocardiogram 2D Echocardiogram has been performed.  Vian Fluegel T Deshaun Schou 04/15/2018, 5:33 PM

## 2018-04-15 NOTE — H&P (Addendum)
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 Vickey Huger has been seen and evaluated and had his wrist repaired at that time and was placed in a splint and returned home subsequent to that.  He states that he fell out of bed 630 this morning and when he fell that he had a severe injury as he could not move his leg He presented to the emergency room for work-up  ED Course:   Review of Systems:  , No chills, no fever, no chest pain, no swelling, no cough, no cold Negative for fever, visual changes, sore throat, rash, new muscle aches, chest pain, SOB, dysuria, bleeding, n/v/abdominal pain.  Past Medical History:  Diagnosis Date  . Anemia    2012  . Arthritis   . Blood transfusion   . 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 Closed fracture of left distal radius and ulna 01/19/2018  . Closed fracture of left proximal humerus 01/19/2018  . COPD (chronic obstructive pulmonary disease) ([place])   . Diabetes mellitus   . GERD (gastroesophageal reflux disease)   . Heart murmur   . Hiatal hernia   . Hyperlipidemia   . Hypertension   . NSTEMI (non-ST elevated myocardial infarction) ([place]) 07/2011   in setting of Pneumonia with normal coronary arteries on cath  . Pneumonia hx 8/12  . Severe aortic stenosis 2012   s/p bioprosthetic AVR  . Shortness of breath    with exertion  . Upper GI bleeding     Past Surgical History:  Procedure  Laterality Date  . AORTIC VALVE REPLACEMENT  10/15/2011   Procedure: AORTIC VALVE REPLACEMENT (AVR);  Surgeon: Tharon Aquas Adelene Idler, MD;  Location: [facility];  Service: Open Heart Surgery;  Laterality: N/A;  . CARDIAC CATHETERIZATION  results on chart   2012 w no significant disease,echo with Severe Aortic stenosis,LVH,Diastolic dysfunction.   . COLONOSCOPY  12/2004  . EYE SURGERY  bil cat 08  . FRACTURE SURGERY  pin in lft hip  . JOINT REPLACEMENT  left knee 08, rt hip 08  . LARYNGOSCOPY  01/05/2013   Procedure: LARYNGOSCOPY;  Surgeon: Melida Quitter, MD;  Location: [facility];  Service: ENT;  Laterality: N/A;  micro direct laryngoscopy with biopsy, possible co2 laser, small laser-safe endotracheal tube  . LARYNGOSCOPY N/A 02/08/2013   Procedure: LARYNGOSCOPY;  Surgeon: Melida Quitter, MD;  Location: [facility];  Service: ENT;  Laterality: N/A;  Micro Direct Laryngoscopy with Co2 laser with re-excision right vocal cord lesion.  . OPEN REDUCTION INTERNAL FIXATION (ORIF) DISTAL RADIAL FRACTURE Left 01/19/2018   Procedure: OPEN REDUCTION INTERNAL FIXATION (ORIF) DISTAL RADIAL FRACTURE;  Surgeon: Marchia Bond, MD;  Location: [facility];  Service: Orthopedics;  Laterality: Left;  . ORIF HUMERUS FRACTURE Left 01/19/2018   Procedure: OPEN REDUCTION INTERNAL FIXATION (ORIF) PROXIMAL HUMERUS FRACTURE;  Surgeon: Marchia Bond, MD;  Location: [facility];  Service: Orthopedics;  Laterality: Left;  . TONSILLECTOMY    .  TOTAL HIP ARTHROPLASTY Right   . TOTAL KNEE ARTHROPLASTY Left   . 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 His smoking use included cigarettes. He has a 50.00 pack-year smoking history. He has never used smokeless tobacco. He reports that he does not drink alcohol or use drugs. Mobility: Only walks about 50 feet and then feels a little winded-no recent swelling or other issues Lives alone Has 2 sons doing and  done and his brother came to visit at the end of our conversation as well he does not drive and needs to be to do certain things as an outpatient Mother passed away at 19 father passed through the age 36   No Known Allergies  History reviewed. No pertinent family history.   Prior to Admission medications   Medication Sig Start Date End Date Taking? Authorizing Provider  cyanocobalamin 2000 MCG tablet Take 4,000 mcg by mouth daily.     [provider]  furosemide (LASIX) 40 MG tablet Take 40 mg by mouth daily.     [provider]  glimepiride (AMARYL) 4 MG tablet Take 4 mg by mouth daily before breakfast.     [provider]  HYDROcodone-acetaminophen (NORCO) 10-325 MG tablet Take 1 tablet by mouth every 6 (six) hours as needed. 01/19/18   Marchia Bond, MD  metFORMIN (GLUCOPHAGE) 1000 MG tablet Take 1,000 mg by mouth 2 (two) times daily with a meal.    [provider]  omeprazole (PRILOSEC) 40 MG capsule Take 40 mg by mouth daily.    [provider]  ondansetron (ZOFRAN) 4 MG tablet Take 1 tablet (4 mg total) by mouth every 8 (eight) hours as needed for nausea or vomiting. 01/19/18   Marchia Bond, MD  potassium chloride SA (K-DUR,KLOR-CON) 20 MEQ tablet Take 20 mEq by mouth daily.      [provider]  pravastatin (PRAVACHOL) 20 MG tablet Take 20 mg by mouth daily. 11/08/17   [provider]  PRESCRIPTION MEDICATION Inject 40 Units into the skin daily. Insulin Doctor is giving him samples of    [provider]    Physical Exam:  Vitals:   04/15/18 1330 04/15/18 1400  BP: (!) 147/85 (!) 155/82  Pulse: 81 87  Resp: 17 16  Temp:    SpO2: 96% 95%     EOMI NCAT arcus senilis good hearing throat supple soft no JVD Mallampati 1 holosystolic murmur across precordium 3/6 best heard left upper sternal edge  Chest is clear no added sound  Abdomen soft nontender nondistended  Range of motion is intact to other  extremities however left lower extremity is externally rotated and shortened he has dorsalis pedis  No rash  Neurologically intact and oriented  Musculoskeletal  I have personally reviewed following labs and imaging studies  Labs:   Sodium 141 BUN/creatinine 16/0.99 pertinent positives are hemoglobin 8.8 down from 10.7 at baseline  Imaging studies:   Hip x-ray showed displaced proximal hip fracture through prior hardware, chest x-ray is clear  Medical tests:   EKG independently reviewed: Incomplete RBBB, sinus rhythm  Test discussed with performing physician:  Discussed with Dr. Alvino Chapel  Decision to obtain old records:   Reviewed  Review and summation of old records:   Viewed and summated extensively  Active Problems:   Hip fracture (HCC)   Assessment/Plan Hip fracture pain control increase Norco from 1  to 1-2 every 6 as needed can also give morphine 1 mg if needed-it is unclear if at this time he will be able to go back home after need for surgery [facility] Peri-op Cardiac mortality=1.47%, at baseline he can walk on a flat service for about 30 to 40 feet but then feels winded Cardiology has been consulted Dr. Domenic Polite and recommends getting an echo and he will see the patient on transport to [facility] prior to surgery to help further risk stratify him  Severe aortic stenosis-patient has not had an echo since 2013 and was last seen by Dr. Radford Pax in 2016-echocardiogram has been ordered as above-on exam I do not appreciate significant murmur  Diabetes mellitus type 2-holding Amaryl 4 mg every morning metformin 1000 twice daily Place on sliding scale  Diastolic heart failure-holding Lasix 40 for now given probable n.p.o. 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 Inpatient status is judged to be reasonable and necessary in order to provide the required intensity of service to ensure the patient's safety. The patient's presenting symptoms, physical exam findings, and initial radiographic and laboratory data in the context of their chronic comorbidities is felt to place them at high risk for further clinical deterioration. Furthermore, it is not anticipated that the patient will be medically stable for discharge from the hospital within 2 midnights of admission. The following factors support the patient status of inpatient.   " The patient's presenting symptoms include hip fracture. " The worrisome physical exam findings include hip fracture, murmur,. " The initial radiographic and laboratory data are worrisome because of hip fracture. " The chronic co-morbidities include multiple inclusive of hip fracture old age poor mobility BMI of 65 he is at high risk for decompensation.   * I certify that at the point of admission it is my clinical judgment that the patient will require inpatient hospital care spanning beyond 2 midnights from the point of admission due to high intensity of service, high risk for further deterioration and high frequency of surveillance required.*     DVT prophylaxis: Lovenox Code Status: Discussed with brother at the bedside he is full code Consults called: Orthopedics was consulted by the emergency room and he will be transferred to [facility] for trauma orthopedics consult who will see him for special type of procedure I spoke to Dr.  Domenic Polite in person who will see the patient at [facility] from a cardiology perspective and echo is pending  Time spent: 75 minutes including care coordination time  Verlon Au, MD  [facility] Direct contact: [phone] --[place]  --[email or web address]; password [email or web address]  7PM-7AM contact night coverage as above  04/15/2018, 2:33 PM

## 2018-04-16 ENCOUNTER — Inpatient Hospital Stay (HOSPITAL_COMMUNITY): Payer: Medicare Other

## 2018-04-16 DIAGNOSIS — Z01810 Encounter for preprocedural cardiovascular examination: Secondary | ICD-10-CM

## 2018-04-16 DIAGNOSIS — I10 Essential (primary) hypertension: Secondary | ICD-10-CM

## 2018-04-16 LAB — GLUCOSE, CAPILLARY
Glucose-Capillary: 175 mg/dL — ABNORMAL HIGH (ref 65–99)
Glucose-Capillary: 191 mg/dL — ABNORMAL HIGH (ref 65–99)
Glucose-Capillary: 203 mg/dL — ABNORMAL HIGH (ref 65–99)
Glucose-Capillary: 151 mg/dL — ABNORMAL HIGH (ref 65–99)

## 2018-04-16 LAB — CBC
HCT: 27.5 % — ABNORMAL LOW (ref 39.0–52.0)
HEMOGLOBIN: 8.2 g/dL — AB (ref 13.0–17.0)
MCH: 23 pg — ABNORMAL LOW (ref 26.0–34.0)
MCHC: 29.8 g/dL — ABNORMAL LOW (ref 30.0–36.0)
MCV: 77.2 fL — AB (ref 78.0–100.0)
PLATELETS: 256 10*3/uL (ref 150–400)
RBC: 3.56 MIL/uL — AB (ref 4.22–5.81)
RDW: 18 % — ABNORMAL HIGH (ref 11.5–15.5)
WBC: 7.8 10*3/uL (ref 4.0–10.5)

## 2018-04-16 LAB — ECHOCARDIOGRAM COMPLETE
Height: 72 in
WEIGHTICAEL: 2800 [oz_av]

## 2018-04-16 LAB — ABO/RH: ABO/RH(D): O POS

## 2018-04-16 LAB — BASIC METABOLIC PANEL
ANION GAP: 9 (ref 5–15)
BUN: 18 mg/dL (ref 6–20)
CHLORIDE: 103 mmol/L (ref 101–111)
CO2: 24 mmol/L (ref 22–32)
Calcium: 9.1 mg/dL (ref 8.9–10.3)
Creatinine, Ser: 1.13 mg/dL (ref 0.61–1.24)
GFR calc Af Amer: >60 mL/min (ref >60)
GLUCOSE: 233 mg/dL — AB (ref 65–99)
POTASSIUM: 4.7 mmol/L (ref 3.5–5.1)
Sodium: 136 mmol/L (ref 135–145)

## 2018-04-16 MED ORDER — DEXTROSE-NACL 5-0.9 % IV SOLN
INTRAVENOUS | Status: DC
Start: 2018-04-16 — End: 2018-04-17
  Administered 2018-04-16: 23:00:00 via INTRAVENOUS

## 2018-04-16 MED ORDER — OXYCODONE HCL 5 MG PO TABS
5.0000 mg | ORAL_TABLET | ORAL | Status: DC | PRN
  Administered 2018-04-16: 5 mg via ORAL
  Filled 2018-04-16: qty 1

## 2018-04-16 MED ORDER — ACETAMINOPHEN 325 MG PO TABS
650.0000 mg | ORAL_TABLET | Freq: Four times a day (QID) | ORAL | Status: DC
  Administered 2018-04-16 – 2018-04-24 (×28): 650 mg via ORAL
  Filled 2018-04-16 (×30): qty 2

## 2018-04-16 MED ORDER — INSULIN ASPART 100 UNIT/ML ~~LOC~~ SOLN: 0.0000 [IU] | Freq: Every day | SUBCUTANEOUS | Status: DC

## 2018-04-16 MED ORDER — INSULIN ASPART 100 UNIT/ML ~~LOC~~ SOLN
0.0000 [IU] | Freq: Three times a day (TID) | SUBCUTANEOUS | Status: DC
  Administered 2018-04-16: 3 [IU] via SUBCUTANEOUS
  Administered 2018-04-17 (×3): 2 [IU] via SUBCUTANEOUS
  Administered 2018-04-18: 3 [IU] via SUBCUTANEOUS
  Administered 2018-04-18: 5 [IU] via SUBCUTANEOUS
  Administered 2018-04-19 (×3): 2 [IU] via SUBCUTANEOUS
  Administered 2018-04-20: 1 [IU] via SUBCUTANEOUS
  Administered 2018-04-20 – 2018-04-21 (×3): 2 [IU] via SUBCUTANEOUS
  Administered 2018-04-21: 5 [IU] via SUBCUTANEOUS
  Administered 2018-04-21 – 2018-04-22 (×2): 2 [IU] via SUBCUTANEOUS
  Administered 2018-04-22: 3 [IU] via SUBCUTANEOUS
  Administered 2018-04-22 – 2018-04-24 (×6): 2 [IU] via SUBCUTANEOUS

## 2018-04-16 MED ORDER — PNEUMOCOCCAL VAC POLYVALENT 25 MCG/0.5ML IJ INJ
0.5000 mL | INJECTION | INTRAMUSCULAR | Status: AC
  Administered 2018-04-17: 0.5 mL via INTRAMUSCULAR
  Filled 2018-04-16: qty 0.5

## 2018-04-16 NOTE — Progress Notes (Signed)
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 Vickey Huger has been seen and evaluated and had his wrist repaired at that time and was placed in a splint and returned home subsequent to that.  He states that he fell out of bed 630 on morning of admission and when he fell that he had a severe injury as he could not move his leg  He presented to the emergency room for work-up  Assessment & Plan:   Active Problems:   Hip fracture (HCC)   Closed left hip fracture (HCC)   Hip fracture: imaging with displaced fx of L proximal femoral shafter with fx extending through overlying hardware of proximal shaft - orthopedics c/s, apparently c/s in ED yesterday, but doesn't look like they've been officially seen yet by ortho.  Will call to discuss plan. - CT L hip ordered (I think this was by ortho) - per EDP note, looks like the plan was for surgery likely Monday - Echo with normal EF, bioprosthetic valve - Seen by cards who note ok for surgery  - Continue pain control (schedule APAP, oxy prn, morphine for breakthrough).  Bowel regimen.   Severe aortic stenosis - s/p bioprosthetic valve placement  Diabetes mellitus type 2- holding Amaryl 4 mg every morning metformin 1000 twice daily. Place on sliding scale  HFpEF- appears euvolemic.  Echo with normal EF.  Hold lasix.  Follow.  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    DVT prophylaxis: lovenox Code Status: full  Family Communication: none at bedside Disposition Plan: pending surgery   Consultants:   Ortho  Cardiology  Procedures:  Echo 5/12 Study Conclusions  - Left ventricle: The cavity size was normal. Wall thickness was   increased in a pattern of mild LVH. Systolic function was normal.   The estimated ejection fraction was in the range of 60% to 65%. - Aortic valve: Bioprosthetic valve with acceptable gradients DVI   .5 suggesting normal function and no significant peri valvular   regurgiation. Valve area (VTI): 0.88 cm^2. Valve area (Vmax):   0.77 cm^2. Valve area (Vmean): 0.73 cm^2. - Mitral valve: Valve area by continuity equation (using LVOT   flow): 0.83 cm^2. - Atrial septum: No defect or patent foramen ovale was identified.  Antimicrobials:  Anti-infectives (From admission, onward)   None         Subjective: Pain is ok after medications.  Objective: Vitals:   04/15/18 1850 04/15/18 2002 04/16/18 0429 04/16/18 1314  BP: (!) 148/86 (!) 143/90 (!) 157/80 (!) 144/77  Pulse: 92 81 74 82  Resp: 18 18  18   Temp: 99 F (37.2 C) 98.6 F (37 C) 97.7 F (36.5 C) 98.1 F (36.7 C)  TempSrc: Oral Oral Oral Oral  SpO2: 97%  98% 98% 95%  Weight:      Height:        Intake/Output Summary (Last 24 hours) at 04/16/2018 1317 Last data filed at 04/16/2018 0900 Gross per 24 hour  Intake -  Output 300 ml  Net -300 ml   Filed Weights   04/15/18 1032  Weight: 79.4 kg (175 lb)    Examination:  General exam: Appears calm and comfortable  Respiratory system: Clear to auscultation. Respiratory effort normal. Cardiovascular system: S1 & S2 heard, RRR. No JVD, murmurs, rubs, gallops or clicks. No pedal  edema. Gastrointestinal system: Abdomen is nondistended, soft and nontender. No organomegaly or masses felt. Normal bowel sounds heard. Central nervous system: Alert and oriented. No focal neurological deficits. Extremities: L leg shortened and externally rotated. Skin: No rashes, lesions or ulcers Psychiatry: Judgement and insight appear normal. Mood & affect appropriate.     Data Reviewed: I have personally reviewed following labs and imaging studies  CBC: Recent Labs  Lab 04/15/18 1050  WBC 10.4  NEUTROABS 7.7  HGB 8.8*  HCT 29.5*  MCV 76.8*  PLT 326   Basic Metabolic Panel: Recent Labs  Lab 04/15/18 1050  NA 141  K 4.2  CL 106  CO2 23  GLUCOSE 80  BUN 16  CREATININE 0.99  CALCIUM 9.0   GFR: Estimated Creatinine Clearance: 68.6 mL/min (by C-G formula based on SCr of 0.99 mg/dL). Liver Function Tests: No results for input(s): AST, ALT, ALKPHOS, BILITOT, PROT, ALBUMIN in the last 168 hours. No results for input(s): LIPASE, AMYLASE in the last 168 hours. No results for input(s): AMMONIA in the last 168 hours. Coagulation Profile: Recent Labs  Lab 04/15/18 1050  INR 1.02   Cardiac Enzymes: No results for input(s): CKTOTAL, CKMB, CKMBINDEX, TROPONINI in the last 168 hours. BNP (last 3 results) No results for input(s): PROBNP in the last 8760 hours. HbA1C: No results for input(s): HGBA1C in the last 72 hours. CBG: Recent Labs  Lab 04/15/18 1849 04/16/18 0649 04/16/18 1143  GLUCAP 137* 175* 191*   Lipid Profile: No results for input(s): CHOL, HDL, LDLCALC, TRIG, CHOLHDL, LDLDIRECT in the last 72 hours. Thyroid Function Tests: No results for input(s): TSH, T4TOTAL, FREET4, T3FREE, THYROIDAB in the last 72 hours. Anemia Panel: No results for input(s): VITAMINB12, FOLATE, FERRITIN, TIBC, IRON, RETICCTPCT in the last 72 hours. Sepsis Labs: No results for input(s): PROCALCITON, LATICACIDVEN in the last 168 hours.  No results found for this or any previous  visit (from the past 240 hour(s)).       Radiology Studies: Dg Chest 1 View  Result Date: 04/15/2018 CLINICAL DATA:  Status post fall today EXAM: CHEST  1 VIEW COMPARISON:  December 29, 2012 FINDINGS: The heart size and mediastinal contours are within normal limits. Both lungs are clear. The visualized skeletal structures are unremarkable. IMPRESSION: No active cardiopulmonary disease. Electronically Signed   By: Abelardo Diesel M.D.   On: 04/15/2018 12:19   Dg Hip Unilat With Pelvis 2-3 Views Left  Result Date: 04/15/2018 CLINICAL DATA:  Status post fall with hip pain. EXAM: DG HIP (WITH OR WITHOUT PELVIS) 2-3V LEFT COMPARISON:  January 15, 2018 FINDINGS: There is displaced fracture of the left proximal femoral shaft with fracture extending through the metallic hardware of the proximal shaft. Total right hip replacement is identified. IMPRESSION: Displaced fracture of the left proximal femoral shaft with fracture extending through the metallic overlying hardware of the proximal shaft. Electronically Signed   By: Mallie Darting.D.  On: 04/15/2018 12:19        Scheduled Meds: . enoxaparin (LOVENOX) injection  30 mg Subcutaneous Q24H  . ferrous sulfate  325 mg Oral BID WC  . insulin aspart  0-5 Units Subcutaneous QHS  . insulin aspart  0-9 Units Subcutaneous TID WC  . pantoprazole  40 mg Oral Daily  . [START ON 04/17/2018] pneumococcal 23 valent vaccine  0.5 mL Intramuscular Tomorrow-1000  . cyanocobalamin  4,000 mcg Oral Daily  . Vitamin D (Ergocalciferol)  50,000 Units Oral Q7 days   Continuous Infusions:   LOS: 1 day    Time spent: over 30 min    Fayrene Helper, MD [facility] Pager [phone]  If 7PM-7AM, please contact night-coverage [email or web address] Password [email or web address] 04/16/2018, 1:17 PM

## 2018-04-16 NOTE — Progress Notes (Signed)
Subjective:  Denies SSCP, palpitations or Dyspnea Wondering what's going on with care for hip   Objective:  Vitals:   04/15/18 1630 04/15/18 1850 04/15/18 2002 04/16/18 0429  BP: 132/61 (!) 148/86 (!) 143/90 (!) 157/80  Pulse: 87 92 81 74  Resp: _0 Temp:  99 F (37.2 C) 98.6 F (37 C) 97.7 F (36.5 C)  TempSrc:  Oral Oral Oral  SpO2: 94% 97% 98% 98%  Weight:      Height:        Intake/Output from previous day:  Intake/Output Summary (Last 24 hours) at 04/16/2018 1015 Last data filed at 04/16/2018 0900 Gross per 24 hour  Intake -  Output 300 ml  Net -300 ml    Physical Exam: Affect appropriate Elderly white male  HEENT: bronchitic voice  Neck supple with no adenopathy JVP normal no bruits no thyromegaly Lungs clear with no wheezing and good diaphragmatic motion Heart:  S1/S2 SEM no AR  murmur, no rub, gallop or click PMI normal Abdomen: benighn, BS positve, no tenderness, no AAA no bruit.  No HSM or HJR Distal pulses intact with no bruits No edema Neuro non-focal Skin warm and dry Left hip fracture    Lab Results: Basic Metabolic Panel: Recent Labs    04/15/18 1050  NA 141  K 4.2  CL 106  CO2 23  GLUCOSE 80  BUN 16  CREATININE 0.99  CALCIUM 9.0   Liver Function Tests: No results for input(s): AST, ALT, ALKPHOS, BILITOT, PROT, ALBUMIN in the last 72 hours. No results for input(s): LIPASE, AMYLASE in the last 72 hours. CBC: Recent Labs    04/15/18 1050  WBC 10.4  NEUTROABS 7.7  HGB 8.8*  HCT 29.5*  MCV 76.8*  PLT 290    Imaging: Dg Chest 1 View  Result Date: 04/15/2018 CLINICAL DATA:  Status post fall today EXAM: CHEST  1 VIEW COMPARISON:  December 29, 2012 FINDINGS: The heart size and mediastinal contours are within normal limits. Both lungs are clear. The visualized skeletal structures are unremarkable. IMPRESSION: No active cardiopulmonary disease. Electronically Signed   By: Abelardo Diesel M.D.   On: 04/15/2018 12:19   Dg  Hip Unilat With Pelvis 2-3 Views Left  Result Date: 04/15/2018 CLINICAL DATA:  Status post fall with hip pain. EXAM: DG HIP (WITH OR WITHOUT PELVIS) 2-3V LEFT COMPARISON:  January 15, 2018 FINDINGS: There is displaced fracture of the left proximal femoral shaft with fracture extending through the metallic hardware of the proximal shaft. Total right hip replacement is identified. IMPRESSION: Displaced fracture of the left proximal femoral shaft with fracture extending through the metallic overlying hardware of the proximal shaft. Electronically Signed   By: Abelardo Diesel M.D.   On: 04/15/2018 12:19    Cardiac Studies:  ECG: SR chronic RBBB no acute changes    Telemetry:  NSR no arrhythmia   Echo: EF normal normal AVR mean gradient 12 mmHg  MAC with mild functional MS   Medications:   . enoxaparin (LOVENOX) injection  30 mg Subcutaneous Q24H  . ferrous sulfate  325 mg Oral BID WC  . pantoprazole  40 mg Oral Daily  . cyanocobalamin  4,000 mcg Oral Daily  . Vitamin D (Ergocalciferol)  50,000 Units Oral Q7 days      Assessment/Plan:  Preop:  Clear to have general anesthesia and orthopedic surgery normal EF normal functioning AVR No history of CAD and no chest pain May need transfusion given  significant anemia Pain control seems fine   Jenkins Rouge 04/16/2018, 10:15 AM

## 2018-04-16 NOTE — Progress Notes (Signed)
Orthopedic Tech Progress Note Patient Details:  Casey Gilmore Jul 06, 1941 [id number]  Patient ID: Casey Gilmore, male   DOB: 08/25/1941, 77 y.o.   MRN: [id number] Pt cant have ohf due to age restrictions.  [name] 04/16/2018, 10:31 PM

## 2018-04-17 ENCOUNTER — Inpatient Hospital Stay (HOSPITAL_COMMUNITY): Payer: Medicare Other

## 2018-04-17 DIAGNOSIS — D649 Anemia, unspecified: Secondary | ICD-10-CM

## 2018-04-17 DIAGNOSIS — E119 Type 2 diabetes mellitus without complications: Secondary | ICD-10-CM

## 2018-04-17 DIAGNOSIS — J449 Chronic obstructive pulmonary disease, unspecified: Secondary | ICD-10-CM

## 2018-04-17 DIAGNOSIS — Z952 Presence of prosthetic heart valve: Secondary | ICD-10-CM

## 2018-04-17 LAB — PREPARE RBC (CROSSMATCH)

## 2018-04-17 LAB — CBC
HCT: 26.6 % — ABNORMAL LOW (ref 39.0–52.0)
HEMATOCRIT: 27.9 % — AB (ref 39.0–52.0)
HEMOGLOBIN: 8.1 g/dL — AB (ref 13.0–17.0)
HEMOGLOBIN: 8.5 g/dL — AB (ref 13.0–17.0)
MCH: 23.4 pg — AB (ref 26.0–34.0)
MCH: 23.3 pg — ABNORMAL LOW (ref 26.0–34.0)
MCHC: 30.5 g/dL (ref 30.0–36.0)
MCHC: 30.5 g/dL (ref 30.0–36.0)
MCV: 76.9 fL — ABNORMAL LOW (ref 78.0–100.0)
MCV: 76.4 fL — ABNORMAL LOW (ref 78.0–100.0)
Platelets: 244 10*3/uL (ref 150–400)
Platelets: 277 10*3/uL (ref 150–400)
RBC: 3.46 MIL/uL — AB (ref 4.22–5.81)
RBC: 3.65 MIL/uL — ABNORMAL LOW (ref 4.22–5.81)
RDW: 18.2 % — ABNORMAL HIGH (ref 11.5–15.5)
RDW: 18.1 % — AB (ref 11.5–15.5)
WBC: 6.3 10*3/uL (ref 4.0–10.5)
WBC: 8.7 10*3/uL (ref 4.0–10.5)

## 2018-04-17 LAB — SURGICAL PCR SCREEN
MRSA, PCR: NEGATIVE
Staphylococcus aureus: NEGATIVE

## 2018-04-17 LAB — COMPREHENSIVE METABOLIC PANEL
ALK PHOS: 81 U/L (ref 38–126)
ALT: 9 U/L — AB (ref 17–63)
AST: 13 U/L — ABNORMAL LOW (ref 15–41)
Albumin: 3 g/dL — ABNORMAL LOW (ref 3.5–5.0)
Anion gap: 7 (ref 5–15)
BUN: 17 mg/dL (ref 6–20)
CALCIUM: 8.8 mg/dL — AB (ref 8.9–10.3)
CO2: 26 mmol/L (ref 22–32)
Chloride: 105 mmol/L (ref 101–111)
Creatinine, Ser: 1.09 mg/dL (ref 0.61–1.24)
GFR calc Af Amer: >60 mL/min (ref >60)
GFR calc non Af Amer: >60 mL/min (ref >60)
GLUCOSE: 189 mg/dL — AB (ref 65–99)
Potassium: 4.3 mmol/L (ref 3.5–5.1)
SODIUM: 138 mmol/L (ref 135–145)
Total Bilirubin: 0.4 mg/dL (ref 0.3–1.2)
Total Protein: 6.1 g/dL — ABNORMAL LOW (ref 6.5–8.1)

## 2018-04-17 LAB — GLUCOSE, CAPILLARY
GLUCOSE-CAPILLARY: 272 mg/dL — AB (ref 65–99)
Glucose-Capillary: 179 mg/dL — ABNORMAL HIGH (ref 65–99)
Glucose-Capillary: 182 mg/dL — ABNORMAL HIGH (ref 65–99)
Glucose-Capillary: 184 mg/dL — ABNORMAL HIGH (ref 65–99)

## 2018-04-17 LAB — MAGNESIUM: Magnesium: 1.5 mg/dL — ABNORMAL LOW (ref 1.7–2.4)

## 2018-04-17 MED ORDER — CEFAZOLIN SODIUM-DEXTROSE 2-4 GM/100ML-% IV SOLN
2.0000 g | INTRAVENOUS | Status: AC
  Administered 2018-04-18: 2 g via INTRAVENOUS
  Filled 2018-04-17 (×2): qty 100

## 2018-04-17 MED ORDER — PRAVASTATIN SODIUM 20 MG PO TABS
20.0000 mg | ORAL_TABLET | Freq: Every day | ORAL | Status: DC
  Administered 2018-04-17 – 2018-04-24 (×7): 20 mg via ORAL
  Filled 2018-04-17 (×7): qty 1

## 2018-04-17 MED ORDER — DIPHENHYDRAMINE HCL 25 MG PO CAPS
25.0000 mg | ORAL_CAPSULE | Freq: Once | ORAL | Status: AC
  Administered 2018-04-17: 25 mg via ORAL
  Filled 2018-04-17: qty 1

## 2018-04-17 MED ORDER — ACETAMINOPHEN 325 MG PO TABS: 650.0000 mg | ORAL_TABLET | Freq: Once | ORAL | Status: DC

## 2018-04-17 MED ORDER — SODIUM CHLORIDE 0.9 % IV SOLN: Freq: Once | INTRAVENOUS | Status: DC

## 2018-04-17 MED ORDER — FUROSEMIDE 10 MG/ML IJ SOLN
20.0000 mg | Freq: Once | INTRAMUSCULAR | Status: AC
  Administered 2018-04-17: 20 mg via INTRAVENOUS
  Filled 2018-04-17: qty 2

## 2018-04-17 MED ORDER — FUROSEMIDE 40 MG PO TABS
40.0000 mg | ORAL_TABLET | Freq: Every day | ORAL | Status: DC
  Administered 2018-04-17 – 2018-04-22 (×5): 40 mg via ORAL
  Filled 2018-04-17 (×5): qty 1

## 2018-04-17 MED ORDER — MAGNESIUM SULFATE 2 GM/50ML IV SOLN
2.0000 g | Freq: Once | INTRAVENOUS | Status: AC
  Administered 2018-04-17: 2 g via INTRAVENOUS
  Filled 2018-04-17: qty 50

## 2018-04-17 MED ORDER — POTASSIUM CHLORIDE IN NACL 20-0.9 MEQ/L-% IV SOLN
INTRAVENOUS | Status: DC
  Administered 2018-04-17 – 2018-04-20 (×6): via INTRAVENOUS
  Filled 2018-04-17 (×5): qty 1000

## 2018-04-17 NOTE — Progress Notes (Signed)
PROGRESS NOTE  Casey Gilmore:[id number] DOB: 1941-06-29 DOA: 04/15/2018 PCP: Mayra Neer, MD  Brief Narrative: 77 year old male complex orthopedic history presented after falling out of bed and developing severe leg pain.  Admitted for hip fracture.  Seen by cardiology, noted to have normal LVEF, no history of CAD, normal functioning aortic valve replacement.  Cleared for surgery.  Assessment/Plan Left hip fracture status post fall out of bed at home.  History of left hip fusion.  Cleared by cardiology for surgery. --Continue pain control, bowel regimen. --Surgery planned 5/14 by orthopedics  Chronic diastolic congestive heart failure --Appears euvolemic. Resume Lasix  COPD --Stable.  Diabetes mellitus --Blood sugars stable.  Continue sliding scale insulin.  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 --Stable.  Follow clinically.  PMH severe aortic stenosis, status post bioprosthetic AVR 2012.  Echocardiogram showed normal valve function.  Patient DVT prophylaxis: enoxaparin >> SCDs for surgery Code Status: full Family Communication: none Disposition Plan: pending    Murray Hodgkins, MD  [facility] Direct contact: [phone] --Via amion app OR  --[email or web address]; password [email or web address]  7PM-7AM contact night coverage as above 04/17/2018, 11:11 AM  LOS: 2 days   Consultants:  Orthopedics  Cardiology  Procedures:  Echo  Study Conclusions  - Left ventricle: The cavity size was normal. Wall thickness was   increased in a pattern of mild LVH. Systolic function was normal.   The estimated ejection fraction was in the range of 60% to 65%. - Aortic valve: Bioprosthetic valve with acceptable gradients DVI   .5 suggesting normal function and no significant peri valvular   regurgiation. Valve area (VTI): 0.88 cm^2. Valve area (Vmax):   0.77 cm^2. Valve area (Vmean): 0.73 cm^2. - Mitral valve: Valve area by continuity equation (using LVOT   flow): 0.83  cm^2. - Atrial septum: No defect or patent foramen ovale was identified.  Antimicrobials:    Interval history/Subjective: Feels fine today.  No chest pain.  Objective: Vitals:  Vitals:   04/17/18 0000 04/17/18 0422  BP:  (!) 151/77  Pulse:  83  Resp: 16   Temp:  98.5 F (36.9 C)  SpO2: 93% 96%    Exam:  Constitutional:  . Appears calm and comfortable Respiratory:  . CTA bilaterally, no w/r/r.  . Respiratory effort normal Cardiovascular:  . RRR, no m/r/g . No LE extremity edema   . Telemetry sinus rhythm Psychiatric:  . Mental status o Mood, affect appropriate  I have personally reviewed the following:   Labs:   Blood sugars stable  Basic metabolic panel unremarkable  Magnesium 1.5  LFTs unremarkable  Hemoglobin stable, 8.1.   Scheduled Meds: . acetaminophen  650 mg Oral Q6H  . ferrous sulfate  325 mg Oral BID WC  . furosemide  40 mg Oral Daily  . insulin aspart  0-5 Units Subcutaneous QHS  . insulin aspart  0-9 Units Subcutaneous TID WC  . pantoprazole  40 mg Oral Daily  . pravastatin  20 mg Oral Daily  . cyanocobalamin  4,000 mcg Oral Daily  . Vitamin D (Ergocalciferol)  50,000 Units Oral Q7 days   Continuous Infusions: . magnesium sulfate 1 - 4 g bolus IVPB      Active Problems:   Diabetes mellitus (HCC)   Chronic diastolic heart failure (HCC)   Aortic valve replaced   COPD (chronic obstructive pulmonary disease) with chronic bronchitis (HCC)   Closed left hip fracture (HCC)   Anemia   LOS: 2 days

## 2018-04-17 NOTE — Consult Note (Addendum)
Orthopaedic Trauma Service (OTS) Consult   Patient ID: OWYN Casey Gilmore MRN: [id number] DOB/AGE: [birth year] 77 y.o.   Reason for Consult: L subtrochanteric femur fracture, periprosthetic fracture of hip fusion hardware Referring Physician: Carter Kitten, MD (ortho)   HPI: Casey Gilmore is an 77 y.o. pleasant white male with complex medical history.  Patient has a history of a left hip fusion back in the 1950s by Dr. Elvina Gilmore.  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  States at baseline since fusion pts hip is flexed about 30 degrees, left leg is shorter than R by about 1.5 inches and he ambulates by walking on toes. He does not wear lifts is shoes or have modified footwear. Prior to his L humerus and L wrist fractures he was not using any assistive devices to ambulate but uses a cane now.  Patient is 3 months postop of his left humerus and left wrist.    Patient sustained a fall out of his bed on 04/15/2018.  Landed directly on his left hip.  Patient had immediate onset of pain and inability to bear weight.  Patient was brought to [facility] where he was found to have complex left subtrochanteric femur fracture with retained hip fusion hardware.  Hardware is also broken.  Due to the complexity of his injury the orthopedic trauma service was consulted for definitive management.  Patient lives alone in a house.  Very few stairs to get into his house.  Son lives next door.  Patient does not drive Currently patient ambulates with a cane.  No shoe lift or accommodative foot wear   History of left total knee arthroplasty done a number of years ago by Dr. Kathryne Gilmore.  Left total hip arthroplasty also done by Dr. Percell Gilmore about 5 years ago  patient does have a fairly extensive cardiac history.  Post aortic valve replacement   patient has been seen and evaluated by cardiology, he has also had an echo done this admission.  He is cleared for surgery. No chronic anticoagulation       Past Medical History:  Diagnosis Date  . Anemia    2012  . Arthritis   . Blood transfusion   . 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 Closed fracture of left distal radius and ulna 01/19/2018  . Closed fracture of left proximal humerus 01/19/2018  . COPD (chronic obstructive pulmonary disease) ([place])   . Diabetes mellitus   . GERD (gastroesophageal reflux disease)   . Heart murmur   . Hiatal hernia   . Hyperlipidemia   . Hypertension   . NSTEMI (non-ST elevated myocardial infarction) (Casey Gilmore) 07/2011   in setting of Pneumonia with normal coronary arteries on cath  . Pneumonia hx 8/12  . Severe aortic stenosis 2012   s/p bioprosthetic AVR  . Shortness of breath    with exertion  . Upper GI bleeding     Past Surgical History:  Procedure Laterality Date  . AORTIC VALVE REPLACEMENT  10/15/2011   Procedure: AORTIC VALVE REPLACEMENT (AVR);  Surgeon: Casey Aquas Adelene Idler, MD;  Location: [facility];  Service: Open Heart Surgery;  Laterality: N/A;  . CARDIAC CATHETERIZATION  results on chart   2012 w no significant disease,echo with Severe Aortic stenosis,LVH,Diastolic dysfunction.   . COLONOSCOPY  12/2004  . EYE SURGERY  bil cat 08  . FRACTURE SURGERY  pin in lft hip  . JOINT REPLACEMENT  left knee 08, rt hip 08  .  LARYNGOSCOPY  01/05/2013   Procedure: LARYNGOSCOPY;  Surgeon: Casey Quitter, MD;  Location: [facility];  Service: ENT;  Laterality: N/A;  micro direct laryngoscopy with biopsy, possible co2 laser, small laser-safe endotracheal tube  . LARYNGOSCOPY N/A 02/08/2013   Procedure: LARYNGOSCOPY;  Surgeon: Casey Quitter, MD;  Location: [facility];  Service: ENT;  Laterality: N/A;  Micro Direct Laryngoscopy with Co2 laser with re-excision right vocal cord lesion.  . OPEN REDUCTION INTERNAL FIXATION (ORIF) DISTAL RADIAL FRACTURE Left 01/19/2018   Procedure: OPEN REDUCTION INTERNAL FIXATION (ORIF) DISTAL RADIAL FRACTURE;  Surgeon: Casey Bond, MD;  Location: [facility];  Service: Orthopedics;  Laterality: Left;  .  ORIF HUMERUS FRACTURE Left 01/19/2018   Procedure: OPEN REDUCTION INTERNAL FIXATION (ORIF) PROXIMAL HUMERUS FRACTURE;  Surgeon: Casey Bond, MD;  Location: [facility];  Service: Orthopedics;  Laterality: Left;  . TONSILLECTOMY    . TOTAL HIP ARTHROPLASTY Right   . TOTAL KNEE ARTHROPLASTY Left   . 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 No pertinent family history.  Social History:  reports that he quit smoking about 39 years ago. His smoking use included cigarettes. He has a 50.00 pack-year smoking history. He has never used smokeless tobacco. He reports that he does not drink alcohol or use drugs.  Allergies: No Known Allergies  Medications:  I have reviewed the patient's current medications. Prior to Admission:  Medications Prior to Admission  Medication Sig Dispense Refill Last Dose  . cyanocobalamin 2000 MCG tablet Take 4,000 mcg by mouth daily.    04/14/2018 at Unknown time  . furosemide (LASIX) 40 MG tablet Take 40 mg by mouth daily.    04/14/2018 at Unknown time  . glimepiride (AMARYL) 4 MG tablet Take 4 mg by mouth daily before breakfast.    04/14/2018 at Unknown time  . HYDROcodone-acetaminophen (NORCO/VICODIN) 5-325 MG tablet TK 1 T PO TID PRF PAIN  0 04/14/2018 at Unknown time  . metFORMIN (GLUCOPHAGE) 1000 MG tablet Take 1,000 mg by mouth 2 (two) times daily with a meal.   04/14/2018 at Unknown time  . Multiple Vitamins-Minerals (MULTIVITAMIN ADULTS) TABS Take 1 tablet by mouth daily.   04/14/2018 at Unknown time  . omeprazole (PRILOSEC) 40 MG capsule Take 40 mg by mouth daily.   04/14/2018 at Unknown time  . ondansetron (ZOFRAN) 4 MG tablet Take 1 tablet (4 mg total) by mouth every 8 (eight) hours as needed for nausea or vomiting. 10 tablet 0   . pravastatin (PRAVACHOL) 20 MG tablet Take 20 mg by mouth daily.  5   . PRESCRIPTION MEDICATION Inject 40 Units into the skin daily. Insulin Doctor is giving him  samples of   04/14/2018 at Unknown time  . Vitamin D, Ergocalciferol, (DRISDOL) 50000 units CAPS capsule TK 1 C PO 2 TIMES A WK FOR 6 MONTHS  6 Past Week at Unknown time  . HYDROcodone-acetaminophen (NORCO) 10-325 MG tablet Take 1 tablet by mouth every 6 (six) hours as needed. (Patient not taking: Reported on 04/15/2018) 30 tablet 0 Completed Course at Unknown time    Results for orders placed or performed during the hospital encounter of 04/15/18 (from the past 48 hour(s))  ABO/Rh     Status: None   Collection Time: 04/15/18 12:10 PM  Result Value Ref Range   ABO/RH(D)      O POS Performed at [facility], [place] [address], [place], [place] [address]   Glucose,  capillary     Status: Abnormal   Collection Time: 04/15/18  6:49 PM  Result Value Ref Range   Glucose-Capillary 137 (H) 65 - 99 mg/dL  Type and screen [place]     Status: None   Collection Time: 04/15/18 10:47 PM  Result Value Ref Range   ABO/RH(D) O POS    Antibody Screen NEG    Sample Expiration      04/18/2018 Performed at [facility], [place] [address] [place], [place] [address]   Glucose, capillary     Status: Abnormal   Collection Time: 04/16/18  6:49 AM  Result Value Ref Range   Glucose-Capillary 175 (H) 65 - 99 mg/dL  Glucose, capillary     Status: Abnormal   Collection Time: 04/16/18 11:43 AM  Result Value Ref Range   Glucose-Capillary 191 (H) 65 - 99 mg/dL  CBC     Status: Abnormal   Collection Time: 04/16/18  1:40 PM  Result Value Ref Range   WBC 7.8 4.0 - 10.5 K/uL   RBC 3.56 (L) 4.22 - 5.81 MIL/uL   Hemoglobin 8.2 (L) 13.0 - 17.0 g/dL   HCT 27.5 (L) 39.0 - 52.0 %   MCV 77.2 (L) 78.0 - 100.0 fL   MCH 23.0 (L) 26.0 - 34.0 pg   MCHC 29.8 (L) 30.0 - 36.0 g/dL   RDW 18.0 (H) 11.5 - 15.5 %   Platelets 256 150 - 400 K/uL    Comment: Performed at [facility], [place] [address] [place], [place] [address]  Basic metabolic panel     Status: Abnormal   Collection  Time: 04/16/18  1:40 PM  Result Value Ref Range   Sodium 136 135 - 145 mmol/L   Potassium 4.7 3.5 - 5.1 mmol/L   Chloride 103 101 - 111 mmol/L   CO2 24 22 - 32 mmol/L   Glucose, Bld 233 (H) 65 - 99 mg/dL   BUN 18 6 - 20 mg/dL   Creatinine, Ser 1.13 0.61 - 1.24 mg/dL   Calcium 9.1 8.9 - 10.3 mg/dL   GFR calc non Af Amer >60 >60 mL/min   GFR calc Af Amer >60 >60 mL/min    Comment: (NOTE) The eGFR has been calculated using the CKD EPI equation. This calculation has not been validated in all clinical situations. eGFR's persistently <60 mL/min signify possible Chronic Kidney Disease.    Anion gap 9 5 - 15    Comment: Performed at [facility] [address] [place], [place] [address]  Glucose, capillary     Status: Abnormal   Collection Time: 04/16/18  4:38 PM  Result Value Ref Range   Glucose-Capillary 203 (H) 65 - 99 mg/dL  Glucose, capillary     Status: Abnormal   Collection Time: 04/16/18  9:20 PM  Result Value Ref Range   Glucose-Capillary 151 (H) 65 - 99 mg/dL  Surgical pcr screen     Status: None   Collection Time: 04/16/18 11:06 PM  Result Value Ref Range   MRSA, PCR NEGATIVE NEGATIVE   Staphylococcus aureus NEGATIVE NEGATIVE    Comment: (NOTE) The Xpert SA Assay (FDA approved for NASAL specimens in patients 46 years of age and older), is one component of a comprehensive surveillance program. It is not intended to diagnose infection nor to guide or monitor treatment. Performed at [facility], [place] [address] [place], [place] [address]   CBC     Status: Abnormal  Collection Time: 04/17/18  3:47 AM  Result Value Ref Range   WBC 6.3 4.0 - 10.5 K/uL   RBC 3.46 (L) 4.22 - 5.81 MIL/uL   Hemoglobin 8.1 (L) 13.0 - 17.0 g/dL   HCT 26.6 (L) 39.0 - 52.0 %   MCV 76.9 (L) 78.0 - 100.0 fL   MCH 23.4 (L) 26.0 - 34.0 pg   MCHC 30.5 30.0 - 36.0 g/dL   RDW 18.2 (H) 11.5 - 15.5 %   Platelets 244 150 - 400 K/uL    Comment: Performed at [facility], [place] [address] [place], [place] [address]  Comprehensive metabolic panel     Status: Abnormal   Collection Time: 04/17/18  3:47 AM  Result Value Ref Range   Sodium 138 135 - 145 mmol/L   Potassium 4.3 3.5 - 5.1 mmol/L   Chloride 105 101 - 111 mmol/L   CO2 26 22 - 32 mmol/L   Glucose, Bld 189 (H) 65 - 99 mg/dL   BUN 17 6 - 20 mg/dL   Creatinine, Ser 1.09 0.61 - 1.24 mg/dL   Calcium 8.8 (L) 8.9 - 10.3 mg/dL   Total Protein 6.1 (L) 6.5 - 8.1 g/dL   Albumin 3.0 (L) 3.5 - 5.0 g/dL   AST 13 (L) 15 - 41 U/L   ALT 9 (L) 17 - 63 U/L   Alkaline Phosphatase 81 38 - 126 U/L   Total Bilirubin 0.4 0.3 - 1.2 mg/dL   GFR calc non Af Amer >60 >60 mL/min   GFR calc Af Amer >60 >60 mL/min    Comment: (NOTE) The eGFR has been calculated using the CKD EPI equation. This calculation has not been validated in all clinical situations. eGFR's persistently <60 mL/min signify possible Chronic Kidney Disease.    Anion gap 7 5 - 15    Comment: Performed at [facility] [address] [place], [place] [address]  Magnesium     Status: Abnormal   Collection Time: 04/17/18  3:47 AM  Result Value Ref Range   Magnesium 1.5 (L) 1.7 - 2.4 mg/dL    Comment: Performed at [facility] [address], [place], [place] [address]  Glucose, capillary     Status: Abnormal   Collection Time: 04/17/18  6:34 AM  Result Value Ref Range   Glucose-Capillary 179 (H) 65 - 99 mg/dL    Dg Chest 1 View  Result Date: 04/15/2018 CLINICAL DATA:  Status post fall today EXAM: CHEST  1 VIEW COMPARISON:  December 29, 2012 FINDINGS: The heart size and mediastinal contours are within normal limits. Both lungs are clear. The visualized skeletal structures are unremarkable. IMPRESSION: No active cardiopulmonary disease. Electronically Signed   By: Abelardo Diesel M.D.   On: 04/15/2018 12:19   Ct Hip Left Wo Contrast  Result Date: 04/16/2018 CLINICAL DATA:  Patient status post fall out of bed 04/15/2018. History of prior fusion of the  left hip joint. Initial encounter. EXAM: CT OF THE LEFT HIP WITHOUT CONTRAST TECHNIQUE: Multidetector CT imaging of the left hip was performed according to the standard protocol. Multiplanar CT image reconstructions were also generated. COMPARISON:  Plain films left hip 04/15/2018. FINDINGS: Bones/Joint/Cartilage There is solid fusion of the left hip joint with an intramedullary fusion device is in place. The patient has a fracture of the left femoral neck extending into the subtrochanteric femur. The femoral neck and intertrochanteric components of the fracture are nondisplaced. At the subtrochanteric femur, the femoral  shaft is laterally displaced 1.2 cm and medially angulated approximately 30 degrees. The patient's fusion device is broken 2.2 cm from its tip at the site of the subtrochanteric component of the patient's fracture. No other fracture is identified. Bones appear osteopenic. 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 Ligaments Suboptimally assessed by CT. Muscles and Tendons There is fatty atrophy of musculature about the left hip, particularly the gluteal musculature. Imaged intrapelvic contents are unremarkable. Soft tissues Soft tissue contusion about the patient's fractures identified. IMPRESSION: Acute femoral neck fracture extending into the inter and subtrochanteric femur of the left hip as described above. The left hip is solidly fused with a fixation device in place. The device is fractured 2.2 cm from its tip at the level of the subtrochanteric component of patient's subtrochanteric fracture. No other acute abnormality Electronically Signed   By: Inge Rise M.D.   On: 04/16/2018 15:07   Dg Hip Unilat With Pelvis 2-3 Views Left  Result Date: 04/15/2018 CLINICAL DATA:  Status post fall with hip pain. EXAM: DG HIP (WITH OR WITHOUT PELVIS) 2-3V LEFT COMPARISON:  January 15, 2018 FINDINGS: There is displaced fracture of the left  proximal femoral shaft with fracture extending through the metallic hardware of the proximal shaft. Total right hip replacement is identified. IMPRESSION: Displaced fracture of the left proximal femoral shaft with fracture extending through the metallic overlying hardware of the proximal shaft. Electronically Signed   By: Abelardo Diesel M.D.   On: 04/15/2018 12:19    Review of Systems  Constitutional: Negative for chills and fever.  Respiratory: Negative for shortness of breath and wheezing.   Cardiovascular: Negative for chest pain and palpitations.  Gastrointestinal: Negative for abdominal pain, nausea and vomiting.  Neurological: Negative for tingling and sensory change.   Blood pressure (!) 151/77, pulse 83, temperature 98.5 F (36.9 C), temperature source Oral, resp. rate 16, height 6' (1.829 m), weight 79.4 kg (175 lb), SpO2 96 %. Physical Exam  Constitutional: He is cooperative. No distress.  Older appearing white male NAD Lying in bed with HOB elevated to about 40 degrees and Pt leaning to R side. States this is most comfortable position for him  Tearful and thankful for the care he is receiving   Cardiovascular: Normal rate and regular rhythm.  + murmur   Pulmonary/Chest: Effort normal. No accessory muscle usage. No respiratory distress.  Anterior fields are clear   Abdominal:  Soft, + BS, NTND  Genitourinary:  Genitourinary Comments: No catheter   Musculoskeletal:  Pelvis--no traumatic wounds or rash, no ecchymosis, stable to manual stress, nontender  Left Lower Extremity  Inspection:   Extensive anterolateral incision to L hip    Incision healed   L TKA incision healed   L leg is externally rotated and short  Bony eval:   TTP L proximal femur   + TTP L knee    Lower leg, ankle and foot are nontender Soft tissue:   No significant swelling to L leg   + muscle atrophy throughout     No open wounds or lesions    Surgical wounds healed as noted above   Sensation:    DPN, SPN, TN sensation intact  Motor:   EHL, FHL, AT, PT, peroneals, gastro motor intact  Vascular:   + DP pulse   Compartments are soft    No pain with passive stretching   Right Lower Extremity  No traumatic wounds, ecchymosis, or rash  Nontender  THA incision well healed              No crepitus or gross motion with manipulation of R leg   No knee or ankle effusion  Knee stable to varus/ valgus and anterior/posterior stress  Sens DPN, SPN, TN intact  Motor EHL, ext, flex, evers 5/5  DP 2+, PT 2+, No significant edema  B upper extremities       shoulder, elbow, wrist, digits- no skin wounds, nontender, no instability             Surgical wounds to L shoulder and L wrist well healed. No significant swelling to L UEx               Restricted L wrist extension noted              Good ROM L shoulder for 3 months post op                R UEx exam is unremarkable   Sens  Ax/R/M/U intact B   Mot   Ax/ R/ PIN/ M/ AIN/ U intact B   Rad 2+    Neurological: He is alert.  Psychiatric: He has a normal mood and affect. His speech is normal and behavior is normal. Cognition and memory are normal.  Nursing note and vitals reviewed.    Assessment/Plan:  77 y/o white male s/p fall with L subtrochanteric femur fracture around hip fusion hardware. Approximately 3 months post op for L proximal humerus fracture and L distal radius fracture   - fall out of bed on 04/15/2018  -L subtrochanteric femur fracture, retained hip fusion hardware (place in 1950's) and broken hardware   Pt has a complex orthopaedic issue  Will require surgical intervention to address his fracture and restore stability   Pt with severe pain in L leg with simple bed mobilization as well  He is active and ambulatory, to some degree, at baseline    pts hip fusion does make for a much more difficult surgery. No sure that we could get good enough positioning to perform IMN even if we did a trochanteric entry  but trochanteric entry for IMN a possibility   Alternatively we could use a fixed angle plate construct to address fracture as site will be opened to remove broken hardware.  Due to positioning limitations this may the most appropriate course of action    Pt will be NWB x 8 weeks post op    Check cbc this pm.  Suspect will give pt some PRBCs today in preparation for surgery tomorrow given anticipated invasive nature   - Pain management:  Titrate as needed    - ABL anemia/Hemodynamics  Cbc this pm   - Medical issues   Per primary   - DVT/PE prophylaxis:  Hold lovenox tonight   - ID:   periop abx  - Metabolic Bone Disease:  Check vitamin d   Pt not on bisphosphonates or other pharmacologic agents for bone health   - Activity:  NWB L leg   - FEN/GI prophylaxis/Casey Gilmore/Lines:  CHO mod diet  NPO after MN   IVF- start NS with 20 K+ at MN   - Dispo:  OR tomorrow to address complex fracture to L proximal femur      Jari Pigg, PA-C [facility] [phone] (C) [phone] (O) 04/17/2018, 11:07 AM

## 2018-04-17 NOTE — Progress Notes (Signed)
Patient seen and examined, well known to me.  Patient well-known to me, had a left distal radius and left proximal humerus fracture ORIF performed January 19, 2018.  He has had a long-standing left hip fusion.  He recently fell out of bed, was admitted to the hospital service.  On exam his EHL is intact, positive pain over the left hip.  X-rays demonstrate a subtrochanteric femur fracture with a fusion above.  Impression: left subtrochanteric femur fracture with very abnormal anatomy, history of arthrodesis with retained hardware.  Plan: this is a complicated difficult situation.  Dr. Marcelino Scot is planning on doing his operation with definitive open reduction internal fixation, first available time is tomorrow.  This is a subspecialty type case requiring specialized instruments and expertise.  I appreciate Dr. Carlean Jews help.  I will allow him to eat today, n.p.o. after midnight, full formal consult to follow with Dr. Marcelino Scot and Ainsley Spinner.  Johnny Bridge, MD

## 2018-04-18 ENCOUNTER — Inpatient Hospital Stay (HOSPITAL_COMMUNITY): Payer: Medicare Other | Admitting: Anesthesiology

## 2018-04-18 ENCOUNTER — Encounter (HOSPITAL_COMMUNITY)
Admission: EM | Disposition: A | Discharge status: Rehabilitation Facility | Payer: Self-pay | Source: Home / Self Care | Attending: Internal Medicine

## 2018-04-18 ENCOUNTER — Encounter (HOSPITAL_COMMUNITY): Payer: Self-pay | Admitting: Anesthesiology

## 2018-04-18 ENCOUNTER — Inpatient Hospital Stay (HOSPITAL_COMMUNITY): Payer: Medicare Other

## 2018-04-18 DIAGNOSIS — I5032 Chronic diastolic (congestive) heart failure: Secondary | ICD-10-CM

## 2018-04-18 HISTORY — PX: ORIF FEMUR FRACTURE: SHX2119

## 2018-04-18 LAB — GLUCOSE, CAPILLARY
GLUCOSE-CAPILLARY: 269 mg/dL — AB (ref 65–99)
Glucose-Capillary: 207 mg/dL — ABNORMAL HIGH (ref 65–99)
Glucose-Capillary: 192 mg/dL — ABNORMAL HIGH (ref 65–99)

## 2018-04-18 LAB — CREATININE, SERUM
CREATININE: 1.12 mg/dL (ref 0.61–1.24)
GFR calc non Af Amer: >60 mL/min (ref >60)

## 2018-04-18 LAB — CBC
HCT: 32.1 % — ABNORMAL LOW (ref 39.0–52.0)
HEMOGLOBIN: 9.9 g/dL — AB (ref 13.0–17.0)
MCH: 23.9 pg — AB (ref 26.0–34.0)
MCHC: 30.8 g/dL (ref 30.0–36.0)
MCV: 77.3 fL — ABNORMAL LOW (ref 78.0–100.0)
Platelets: 247 10*3/uL (ref 150–400)
RBC: 4.15 MIL/uL — AB (ref 4.22–5.81)
RDW: 17.5 % — ABNORMAL HIGH (ref 11.5–15.5)
WBC: 7.9 10*3/uL (ref 4.0–10.5)

## 2018-04-18 LAB — HEMOGLOBIN A1C
HEMOGLOBIN A1C: 7.6 % — AB (ref 4.8–5.6)
MEAN PLASMA GLUCOSE: 171.42 mg/dL

## 2018-04-18 SURGERY — OPEN REDUCTION INTERNAL FIXATION FEMORAL SHAFT FRACTURE
Anesthesia: General | Laterality: Left

## 2018-04-18 MED ORDER — LACTATED RINGERS IV SOLN
INTRAVENOUS | Status: DC | PRN
  Administered 2018-04-18 (×2): via INTRAVENOUS

## 2018-04-18 MED ORDER — DOCUSATE SODIUM 100 MG PO CAPS
100.0000 mg | ORAL_CAPSULE | Freq: Two times a day (BID) | ORAL | Status: DC
  Administered 2018-04-18 – 2018-04-24 (×13): 100 mg via ORAL
  Filled 2018-04-18 (×13): qty 1

## 2018-04-18 MED ORDER — ONDANSETRON HCL 4 MG PO TABS
4.0000 mg | ORAL_TABLET | Freq: Four times a day (QID) | ORAL | Status: DC | PRN
Start: 2018-04-18 — End: 2018-04-24
  Administered 2018-04-20: 4 mg via ORAL

## 2018-04-18 MED ORDER — ROCURONIUM BROMIDE 50 MG/5ML IV SOLN
INTRAVENOUS | Status: AC
  Filled 2018-04-18: qty 1

## 2018-04-18 MED ORDER — ONDANSETRON HCL 4 MG/2ML IJ SOLN
INTRAMUSCULAR | Status: DC | PRN
  Administered 2018-04-18: 4 mg via INTRAVENOUS

## 2018-04-18 MED ORDER — PHENYLEPHRINE HCL 10 MG/ML IJ SOLN
INTRAMUSCULAR | Status: DC | PRN
  Administered 2018-04-18: 80 ug via INTRAVENOUS
  Administered 2018-04-18: 40 ug via INTRAVENOUS
  Administered 2018-04-18 (×2): 80 ug via INTRAVENOUS
  Administered 2018-04-18: 40 ug via INTRAVENOUS
  Administered 2018-04-18: 80 ug via INTRAVENOUS
  Administered 2018-04-18: 40 ug via INTRAVENOUS

## 2018-04-18 MED ORDER — EPHEDRINE SULFATE 50 MG/ML IJ SOLN
INTRAMUSCULAR | Status: AC
  Filled 2018-04-18: qty 1

## 2018-04-18 MED ORDER — 0.9 % SODIUM CHLORIDE (POUR BTL) OPTIME
TOPICAL | Status: DC | PRN
  Administered 2018-04-18: 1000 mL

## 2018-04-18 MED ORDER — MIDAZOLAM HCL 2 MG/2ML IJ SOLN
2.0000 mg | Freq: Once | INTRAMUSCULAR | Status: AC
  Administered 2018-04-18: 2 mg via INTRAVENOUS

## 2018-04-18 MED ORDER — SODIUM CHLORIDE 0.9 % IJ SOLN
INTRAMUSCULAR | Status: AC
  Filled 2018-04-18: qty 10

## 2018-04-18 MED ORDER — OXYCODONE HCL 5 MG PO TABS
5.0000 mg | ORAL_TABLET | ORAL | Status: DC | PRN
  Administered 2018-04-18 – 2018-04-20 (×9): 10 mg via ORAL
  Administered 2018-04-21: 5 mg via ORAL
  Administered 2018-04-21 – 2018-04-22 (×4): 10 mg via ORAL
  Administered 2018-04-22: 5 mg via ORAL
  Administered 2018-04-23 (×2): 10 mg via ORAL
  Filled 2018-04-18 (×7): qty 2
  Filled 2018-04-18 (×2): qty 1
  Filled 2018-04-18 (×8): qty 2

## 2018-04-18 MED ORDER — MIDAZOLAM HCL 2 MG/2ML IJ SOLN
INTRAMUSCULAR | Status: AC
  Filled 2018-04-18: qty 2

## 2018-04-18 MED ORDER — LIDOCAINE HCL (CARDIAC) PF 100 MG/5ML IV SOSY
PREFILLED_SYRINGE | INTRAVENOUS | Status: DC | PRN
  Administered 2018-04-18: 60 mg via INTRAVENOUS

## 2018-04-18 MED ORDER — PHENYLEPHRINE 40 MCG/ML (10ML) SYRINGE FOR IV PUSH (FOR BLOOD PRESSURE SUPPORT)
PREFILLED_SYRINGE | INTRAVENOUS | Status: AC
  Filled 2018-04-18: qty 10

## 2018-04-18 MED ORDER — ACETAMINOPHEN 160 MG/5ML PO SOLN: 325.0000 mg | ORAL | Status: DC | PRN

## 2018-04-18 MED ORDER — PROPOFOL 10 MG/ML IV BOLUS
INTRAVENOUS | Status: DC | PRN
  Administered 2018-04-18: 90 mg via INTRAVENOUS

## 2018-04-18 MED ORDER — FENTANYL CITRATE (PF) 250 MCG/5ML IJ SOLN
INTRAMUSCULAR | Status: AC
  Filled 2018-04-18: qty 5

## 2018-04-18 MED ORDER — SUGAMMADEX SODIUM 200 MG/2ML IV SOLN
INTRAVENOUS | Status: DC | PRN
  Administered 2018-04-18: 200 mg via INTRAVENOUS

## 2018-04-18 MED ORDER — METOCLOPRAMIDE HCL 5 MG/ML IJ SOLN: 5.0000 mg | Freq: Three times a day (TID) | INTRAMUSCULAR | Status: DC | PRN

## 2018-04-18 MED ORDER — ENOXAPARIN SODIUM 30 MG/0.3ML ~~LOC~~ SOLN
30.0000 mg | SUBCUTANEOUS | Status: DC
  Administered 2018-04-19 – 2018-04-20 (×2): 30 mg via SUBCUTANEOUS
  Filled 2018-04-18 (×2): qty 0.3

## 2018-04-18 MED ORDER — OXYCODONE HCL 5 MG/5ML PO SOLN: 5.0000 mg | Freq: Once | ORAL | Status: DC | PRN

## 2018-04-18 MED ORDER — FENTANYL CITRATE (PF) 100 MCG/2ML IJ SOLN
INTRAMUSCULAR | Status: DC | PRN
  Administered 2018-04-18 (×3): 50 ug via INTRAVENOUS
  Administered 2018-04-18: 100 ug via INTRAVENOUS
  Administered 2018-04-18: 50 ug via INTRAVENOUS

## 2018-04-18 MED ORDER — OXYCODONE HCL 5 MG PO TABS: 5.0000 mg | ORAL_TABLET | Freq: Once | ORAL | Status: DC | PRN

## 2018-04-18 MED ORDER — SUGAMMADEX SODIUM 200 MG/2ML IV SOLN
INTRAVENOUS | Status: AC
  Filled 2018-04-18: qty 2

## 2018-04-18 MED ORDER — LIDOCAINE 2% (20 MG/ML) 5 ML SYRINGE
INTRAMUSCULAR | Status: AC
  Filled 2018-04-18: qty 5

## 2018-04-18 MED ORDER — FENTANYL CITRATE (PF) 100 MCG/2ML IJ SOLN
INTRAMUSCULAR | Status: AC
  Filled 2018-04-18: qty 2

## 2018-04-18 MED ORDER — ACETAMINOPHEN 325 MG PO TABS: 325.0000 mg | ORAL_TABLET | ORAL | Status: DC | PRN

## 2018-04-18 MED ORDER — ROCURONIUM BROMIDE 100 MG/10ML IV SOLN
INTRAVENOUS | Status: DC | PRN
  Administered 2018-04-18: 20 mg via INTRAVENOUS
  Administered 2018-04-18: 50 mg via INTRAVENOUS
  Administered 2018-04-18 (×2): 10 mg via INTRAVENOUS
  Administered 2018-04-18: 20 mg via INTRAVENOUS

## 2018-04-18 MED ORDER — ONDANSETRON HCL 4 MG/2ML IJ SOLN
4.0000 mg | Freq: Four times a day (QID) | INTRAMUSCULAR | Status: DC | PRN
  Administered 2018-04-18: 4 mg via INTRAVENOUS
  Filled 2018-04-18: qty 2

## 2018-04-18 MED ORDER — CEFAZOLIN SODIUM-DEXTROSE 1-4 GM/50ML-% IV SOLN
1.0000 g | Freq: Four times a day (QID) | INTRAVENOUS | Status: AC
  Administered 2018-04-18 – 2018-04-19 (×3): 1 g via INTRAVENOUS
  Filled 2018-04-18 (×3): qty 50

## 2018-04-18 MED ORDER — FENTANYL CITRATE (PF) 100 MCG/2ML IJ SOLN: 25.0000 ug | INTRAMUSCULAR | Status: DC | PRN

## 2018-04-18 MED ORDER — LACTATED RINGERS IV SOLN
INTRAVENOUS | Status: DC | PRN
  Administered 2018-04-18: 08:00:00 via INTRAVENOUS

## 2018-04-18 MED ORDER — METOCLOPRAMIDE HCL 5 MG PO TABS: 5.0000 mg | ORAL_TABLET | Freq: Three times a day (TID) | ORAL | Status: DC | PRN

## 2018-04-18 MED ORDER — FENTANYL CITRATE (PF) 100 MCG/2ML IJ SOLN
25.0000 ug | INTRAMUSCULAR | Status: DC | PRN
  Administered 2018-04-18: 50 ug via INTRAVENOUS
  Administered 2018-04-18 (×2): 25 ug via INTRAVENOUS

## 2018-04-18 MED ORDER — PROPOFOL 10 MG/ML IV BOLUS
INTRAVENOUS | Status: AC
  Filled 2018-04-18: qty 20

## 2018-04-18 MED ORDER — DEXAMETHASONE SODIUM PHOSPHATE 4 MG/ML IJ SOLN
INTRAMUSCULAR | Status: DC | PRN
  Administered 2018-04-18: 4 mg via INTRAVENOUS

## 2018-04-18 MED ORDER — DEXAMETHASONE SODIUM PHOSPHATE 10 MG/ML IJ SOLN
INTRAMUSCULAR | Status: AC
  Filled 2018-04-18: qty 1

## 2018-04-18 MED ORDER — ONDANSETRON HCL 4 MG/2ML IJ SOLN: 4.0000 mg | Freq: Once | INTRAMUSCULAR | Status: DC | PRN

## 2018-04-18 MED ORDER — ONDANSETRON HCL 4 MG/2ML IJ SOLN
INTRAMUSCULAR | Status: AC
  Filled 2018-04-18: qty 2

## 2018-04-18 SURGICAL SUPPLY — 58 items
BIT DRILL Q/COUPLING 1 (BIT) ×2 IMPLANT
BNDG COHESIVE 6X5 TAN STRL LF (GAUZE/BANDAGES/DRESSINGS) ×2 IMPLANT
BONE CANC CHIPS 20CC PCAN1/4 (Bone Implant) ×2 IMPLANT
BONE CANC CHIPS 40CC CAN1/2 (Bone Implant) ×2 IMPLANT
BRUSH SCRUB SURG 4.25 DISP (MISCELLANEOUS) ×4 IMPLANT
CHIPS CANC BONE 20CC PCAN1/4 (Bone Implant) ×1 IMPLANT
CHIPS CANC BONE 40CC CAN1/2 (Bone Implant) ×1 IMPLANT
COVER SURGICAL LIGHT HANDLE (MISCELLANEOUS) ×4 IMPLANT
DRAPE C-ARM 42X72 X-RAY (DRAPES) ×2 IMPLANT
DRAPE C-ARMOR (DRAPES) ×2 IMPLANT
DRAPE IMP U-DRAPE 54X76 (DRAPES) ×2 IMPLANT
DRAPE ORTHO SPLIT 77X108 STRL (DRAPES) ×2
DRAPE SURG ORHT 6 SPLT 77X108 (DRAPES) ×2 IMPLANT
DRAPE U-SHAPE 47X51 STRL (DRAPES) ×2 IMPLANT
DRSG ADAPTIC 3X8 NADH LF (GAUZE/BANDAGES/DRESSINGS) ×2 IMPLANT
DRSG MEPILEX BORDER 4X12 (GAUZE/BANDAGES/DRESSINGS) ×2 IMPLANT
DRSG PAD ABDOMINAL 8X10 ST (GAUZE/BANDAGES/DRESSINGS) ×8 IMPLANT
ELECT REM PT RETURN 9FT ADLT (ELECTROSURGICAL) ×2
ELECTRODE REM PT RTRN 9FT ADLT (ELECTROSURGICAL) ×1 IMPLANT
EVACUATOR 1/8 PVC DRAIN (DRAIN) IMPLANT
GAUZE SPONGE 4X4 12PLY STRL (GAUZE/BANDAGES/DRESSINGS) ×2 IMPLANT
GLOVE BIO SURGEON STRL SZ7.5 (GLOVE) ×2 IMPLANT
GLOVE BIO SURGEON STRL SZ8 (GLOVE) ×2 IMPLANT
GLOVE BIOGEL PI IND STRL 7.5 (GLOVE) ×1 IMPLANT
GLOVE BIOGEL PI IND STRL 8 (GLOVE) ×1 IMPLANT
GLOVE BIOGEL PI INDICATOR 7.5 (GLOVE) ×1
GLOVE BIOGEL PI INDICATOR 8 (GLOVE) ×1
GOWN STRL REUS W/ TWL LRG LVL3 (GOWN DISPOSABLE) ×2 IMPLANT
GOWN STRL REUS W/ TWL XL LVL3 (GOWN DISPOSABLE) ×1 IMPLANT
GOWN STRL REUS W/TWL LRG LVL3 (GOWN DISPOSABLE) ×2
GOWN STRL REUS W/TWL XL LVL3 (GOWN DISPOSABLE) ×1
GUIDEWIRE THREADED 2.8 (WIRE) ×4 IMPLANT
KIT BASIN OR (CUSTOM PROCEDURE TRAY) ×2 IMPLANT
KIT TURNOVER KIT B (KITS) ×2 IMPLANT
MANIFOLD NEPTUNE II (INSTRUMENTS) ×2 IMPLANT
NS IRRIG 1000ML POUR BTL (IV SOLUTION) ×2 IMPLANT
PACK TOTAL JOINT (CUSTOM PROCEDURE TRAY) ×2 IMPLANT
PACK UNIVERSAL I (CUSTOM PROCEDURE TRAY) ×2 IMPLANT
PAD ABD 8X10 STRL (GAUZE/BANDAGES/DRESSINGS) ×2 IMPLANT
PAD ARMBOARD 7.5X6 YLW CONV (MISCELLANEOUS) ×4 IMPLANT
PLATE ANGLED BLADE 90X130D 12H (Plate) ×2 IMPLANT
SCREW CORTEX ST 4.5X38 (Screw) ×4 IMPLANT
SCREW CORTEX ST 4.5X40 (Screw) ×2 IMPLANT
SCREW CORTEX ST 4.5X42 (Screw) ×2 IMPLANT
SPONGE LAP 18X18 X RAY DECT (DISPOSABLE) ×2 IMPLANT
STAPLER VISISTAT 35W (STAPLE) ×2 IMPLANT
SUCTION FRAZIER TIP 10 FR DISP (SUCTIONS) ×2 IMPLANT
SUT ETHILON 2 0 PSLX (SUTURE) ×4 IMPLANT
SUT VIC AB 0 CT1 27 (SUTURE) ×1
SUT VIC AB 0 CT1 27XBRD ANBCTR (SUTURE) ×1 IMPLANT
SUT VIC AB 1 CT1 27 (SUTURE) ×4
SUT VIC AB 1 CT1 27XBRD ANTBC (SUTURE) ×4 IMPLANT
SUT VIC AB 2-0 CT1 27 (SUTURE) ×2
SUT VIC AB 2-0 CT1 TAPERPNT 27 (SUTURE) ×2 IMPLANT
TOWEL OR 17X24 6PK STRL BLUE (TOWEL DISPOSABLE) ×2 IMPLANT
TOWEL OR 17X26 10 PK STRL BLUE (TOWEL DISPOSABLE) ×4 IMPLANT
TRAY FOLEY MTR SLVR 16FR STAT (SET/KITS/TRAYS/PACK) IMPLANT
WATER STERILE IRR 1000ML POUR (IV SOLUTION) ×6 IMPLANT

## 2018-04-18 NOTE — Progress Notes (Signed)
Lunch relief by MA Jackelyne Sayer RN 

## 2018-04-18 NOTE — Transfer of Care (Signed)
Immediate Anesthesia Transfer of Care Note  Patient: BLADIMIR AUMAN  Procedure(s) Performed: OPEN REDUCTION INTERNAL FIXATION PROXIMAL FEMORAL SHAFT FRACTURE...REMOVAL OF HARDWARE LEFT FEMUR (HIP FUSION PLATE) (Left )  Patient Location: PACU  Anesthesia Type:General  Level of Consciousness: awake, oriented and patient cooperative  Airway & Oxygen Therapy: Patient Spontanous Breathing and Patient connected to face mask oxygen  Post-op Assessment: Report given to RN and Post -op Vital signs reviewed and stable  Post vital signs: Reviewed  Last Vitals:  Vitals Value Taken Time  BP 170/106 04/18/2018 12:17 PM  Temp    Pulse 99 04/18/2018 12:17 PM  Resp 17 04/18/2018 12:17 PM  SpO2 100 % 04/18/2018 12:17 PM  Vitals shown include unvalidated device data.  Last Pain:  Vitals:   04/18/18 0530  TempSrc:   PainSc: 2          Complications: No apparent anesthesia complications

## 2018-04-18 NOTE — Evaluation (Signed)
Physical Therapy Evaluation Patient Details Name: Casey Gilmore MRN: [id number] DOB: 09/04/1941 Today's Date: 04/18/2018   History of Present Illness  pt is a 77 y/o male with pmh significant for L hip fusion as a youth, COPD, DM, NSTEMI, vocal cord CA, HTN, recent fall with L humeral and radial/ulnar fx's, admitted after falling OOB sustaining proximal peritrochanteric femur fx's, s/p ORIF and removal of fusion plating from childhood.  Clinical Impression  Pt admitted with/for fall with L femur fx, s/p ORIF.  Pt is significantly limited due to pain, needing 2 person total assist.  Pt currently limited functionally due to the problems listed. ( See problems list.)   Pt will benefit from PT to maximize function and safety in order to get ready for next venue listed below.     Follow Up Recommendations SNF;Supervision/Assistance - 24 hour    Equipment Recommendations  Other (comment)(TBA)    Recommendations for Other Services       Precautions / Restrictions Restrictions Weight Bearing Restrictions: Yes LLE Weight Bearing: Touchdown weight bearing      Mobility  Bed Mobility               General bed mobility comments: attempted bridging to EOB with limited success and pain limiting continuing.  Transfers                 General transfer comment: unable  Ambulation/Gait             General Gait Details: unable today  Stairs            Wheelchair Mobility    Modified Rankin (Stroke Patients Only)       Balance                                             Pertinent Vitals/Pain Pain Assessment: Faces Pain Score: 9  Faces Pain Scale: Hurts whole lot Pain Location: R hip Pain Descriptors / Indicators: Sharp;Sore Pain Intervention(s): Monitored during session;Repositioned;Limited activity within patient's tolerance    Home Living Family/patient expects to be discharged to:: Private residence Living Arrangements:  Alone Available Help at Discharge: Family;Available PRN/intermittently Type of Home: House Home Access: Stairs to enter Entrance Stairs-Rails: Chemical engineer of Steps: 4 Home Layout: One level Home Equipment: Cane - single point      Prior Function Level of Independence: Independent with assistive device(s)               Hand Dominance   Dominant Hand: Right    Extremity/Trunk Assessment   Upper Extremity Assessment Upper Extremity Assessment: (left shoulder limited)    Lower Extremity Assessment Lower Extremity Assessment: RLE deficits/detail;LLE deficits/detail RLE Deficits / Details: stiff and limited by pain LLE Deficits / Details: limited ROM at hip and knee,  pain limiting LLE Coordination: decreased gross motor;decreased fine motor       Communication   Communication: No difficulties  Cognition Arousal/Alertness: Awake/alert Behavior During Therapy: WFL for tasks assessed/performed Overall Cognitive Status: Within Functional Limits for tasks assessed                                        General Comments General comments (skin integrity, edema, etc.): With movement of LE's pt's HR jumped into the 140's and sats  dropped into the 80's    Exercises     Assessment/Plan    PT Assessment Patient needs continued PT services  PT Problem List Decreased strength;Decreased range of motion;Decreased mobility;Decreased coordination;Pain       PT Treatment Interventions Functional mobility training;DME instruction;Therapeutic activities;Therapeutic exercise;Patient/family education;Gait training    PT Goals (Current goals can be found in the Care Plan section)  Acute Rehab PT Goals Patient Stated Goal: wait until tomorrow to move. PT Goal Formulation: With patient Time For Goal Achievement: 05/02/18 Potential to Achieve Goals: Fair    Frequency Min 3X/week   Barriers to discharge Decreased caregiver support       Co-evaluation               AM-PAC PT "6 Clicks" Daily Activity  Outcome Measure Difficulty turning over in bed (including adjusting bedclothes, sheets and blankets)?: Unable Difficulty moving from lying on back to sitting on the side of the bed? : Unable Difficulty sitting down on and standing up from a chair with arms (e.g., wheelchair, bedside commode, etc,.)?: Unable Help needed moving to and from a bed to chair (including a wheelchair)?: Total Help needed walking in hospital room?: Total Help needed climbing 3-5 steps with a railing? : Total 6 Click Score: 6    End of Session   Activity Tolerance: Patient limited by pain Patient left: in bed;with call bell/phone within reach Nurse Communication: Mobility status PT Visit Diagnosis: Other abnormalities of gait and mobility (R26.89);Pain Pain - Right/Left: Left Pain - part of body: (femur)    Time: 1478-2956 PT Time Calculation (min) (ACUTE ONLY): 22 min   Charges:   PT Evaluation $PT Eval Moderate Complexity: 1 Mod     PT G Codes:        May 12, 2018  Donnella Sham, PT [phone]  (pager)  Tessie Fass Fatema Rabe 05/12/18, 5:00 PM

## 2018-04-18 NOTE — Progress Notes (Signed)
Orthopedic Tech Progress Note Patient Details:  Casey Gilmore [birth year] [id number]  Patient ID: [name], male   DOB: 1941-12-06, 77 y.o.   MRN: [id number] Pt cant have ohf due to age restrictions.  [name] 04/18/2018, 11:03 PM

## 2018-04-18 NOTE — Progress Notes (Signed)
Pt very agitated, says he's hurting, then says he's not. He knows his name & that he is in the hospital. Dr Jillyn Hidden updated, new order for IV Versed and he will be over to see pt.

## 2018-04-18 NOTE — Progress Notes (Addendum)
PROGRESS NOTE  Casey Gilmore KDX:[id number] DOB: Mar 04, 1941 DOA: 04/15/2018 PCP: Mayra Neer, MD  Brief Narrative: 77 year old male complex orthopedic history presented after falling out of bed and developing severe leg pain.  Admitted for hip fracture.  Seen by cardiology, noted to have normal LVEF, no history of CAD, normal functioning aortic valve replacement.  Cleared for surgery.  Underwent operative fixation 5/14.  Assessment/Plan Left hip fracture status post fall out of bed at home.  History of left hip fusion.  Cleared by cardiology for surgery.  Status post operative fixation 5/14. --Continue pain control, bowel regimen, management per orthopedics  Chronic diastolic congestive heart failure --Appears stable, continue Lasix  COPD --Appears stable.  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 --Check CBC in a.m.  PMH severe aortic stenosis, status post bioprosthetic AVR 2012.  Echocardiogram showed normal valve function.  Patient DVT prophylaxis: enoxaparin >> SCDs for surgery per orthopedics postoperatively >> Code Status: full Family Communication: none Disposition Plan: pending    Murray Hodgkins, MD  [facility] Direct contact: [phone] --Via [place]  --[email or web address]; password [email or web address]  7PM-7AM contact night coverage as above 04/18/2018, 3:12 PM  LOS: 3 days   Consultants:  Orthopedics  Cardiology  Procedures:  Echo  Study Conclusions  - Left ventricle: The cavity size was normal. Wall thickness was   increased in a pattern of mild LVH. Systolic function was normal.   The estimated ejection fraction was in the range of 60% to 65%. - Aortic valve: Bioprosthetic valve with acceptable gradients DVI   .5 suggesting normal function and no significant peri valvular   regurgiation. Valve area (VTI): 0.88 cm^2. Valve area (Vmax):   0.77 cm^2. Valve area  (Vmean): 0.73 cm^2. - Mitral valve: Valve area by continuity equation (using LVOT   flow): 0.83 cm^2. - Atrial septum: No defect or patent foramen ovale was identified.    1.  Open reduction internal fixation of left proximal femur using a Synthes 130-degree blade plate. 2.  Removal of hardware, left femur, hip fusion plate partial.   Antimicrobials:    Interval history/Subjective: .  Feeling okay postoperatively.     Objective: Vitals:  Vitals:   04/18/18 1353 04/18/18 1400  BP:  (!) 165/92  Pulse:  98  Resp:  16  Temp:  (!) 97.5 F (36.4 C)  SpO2: 98% 90%    Exam:  Constitutional:   . Appears calm and comfortable Respiratory:  . CTA bilaterally, no w/r/r.  . Respiratory effort normal.  Cardiovascular:  . RRR, no m/r/g Psychiatric:  . Mental status o Mood, affect appropriate  I have personally reviewed the following:   Labs:   Blood sugars stable  Hemoglobin A1c 7.6   Scheduled Meds: . acetaminophen  650 mg Oral Q6H  . acetaminophen  650 mg Oral Once  . docusate sodium  100 mg Oral BID  . [START ON 04/19/2018] enoxaparin (LOVENOX) injection  30 mg Subcutaneous Q24H  . fentaNYL      . ferrous sulfate  325 mg Oral BID WC  . furosemide  40 mg Oral Daily  . insulin aspart  0-5 Units Subcutaneous QHS  . insulin aspart  0-9 Units Subcutaneous TID WC  . midazolam      . pantoprazole  40 mg Oral Daily  . pravastatin  20 mg Oral Daily  . cyanocobalamin  4,000 mcg Oral Daily  . Vitamin D (Ergocalciferol)  50,000 Units  Oral Q7 days   Continuous Infusions: . sodium chloride    . 0.9 % NaCl with KCl 20 mEq / L 100 mL/hr at 04/18/18 1424  .  ceFAZolin (ANCEF) IV      Principal Problem:   Closed left hip fracture (HCC) Active Problems:   Diabetes mellitus (HCC)   Chronic diastolic heart failure (HCC)   Aortic valve replaced   COPD (chronic obstructive pulmonary disease) with chronic bronchitis (HCC)   Anemia   LOS: 3 days

## 2018-04-18 NOTE — Anesthesia Preprocedure Evaluation (Addendum)
Anesthesia Evaluation  Patient identified by MRN, date of birth, ID band Patient awake    Reviewed: Allergy & Precautions, NPO status , Patient's Chart, lab work & pertinent test results  History of Anesthesia Complications Negative for: history of anesthetic complications  Airway Mallampati: II  TM Distance: >3 FB Neck ROM: Full    Dental  (+) Edentulous Upper, Edentulous Lower, Dental Advisory Given   Pulmonary shortness of breath, COPD, former smoker,    breath sounds clear to auscultation       Cardiovascular hypertension, Pt. on medications + Past MI   Rhythm:Regular  NSTEMI 2012, AVR 2012, EF 60   Neuro/Psych  Neuromuscular disease    GI/Hepatic Neg liver ROS, hiatal hernia, GERD  Controlled and Medicated,  Endo/Other  diabetes, Type 2, Oral Hypoglycemic Agents  Renal/GU negative Renal ROS     Musculoskeletal  (+) Arthritis , Left femur fx   Abdominal   Peds  Hematology  (+) anemia ,   Anesthesia Other Findings   Reproductive/Obstetrics                           Anesthesia Physical Anesthesia Plan  ASA: III  Anesthesia Plan: General   Post-op Pain Management:    Induction: Intravenous  PONV Risk Score and Plan: 2 and Dexamethasone and Ondansetron  Airway Management Planned: Oral ETT  Additional Equipment: None  Intra-op Plan:   Post-operative Plan: Extubation in OR  Informed Consent: I have reviewed the patients History and Physical, chart, labs and discussed the procedure including the risks, benefits and alternatives for the proposed anesthesia with the patient or authorized representative who has indicated his/her understanding and acceptance.   Dental advisory given  Plan Discussed with: CRNA and Surgeon  Anesthesia Plan Comments:         Anesthesia Quick Evaluation

## 2018-04-18 NOTE — Op Note (Signed)
NAME: Casey Gilmore, Casey Gilmore MEDICAL RECORD [id number] ACCOUNT [id number] DATE OF BIRTH:03-14-1941 FACILITY: [facility] LOCATION: [facility] PHYSICIAN:Ekam Bonebrake H. Clerence Gubser, MD  OPERATIVE REPORT  DATE OF PROCEDURE:  04/18/2018  PREOPERATIVE DIAGNOSES: 1.  Left subtrochanteric femur fracture. 2.  Retained broken hardware, left hip, status post fusion.  POSTOPERATIVE DIAGNOSES:   1.  Left subtrochanteric femur fracture. 2.  Retained broken hardware, left hip, status post fusion.  PROCEDURES: 1.  Open reduction internal fixation of left proximal femur using a Synthes 130-degree blade plate. 2.  Removal of hardware, left femur, hip fusion plate partial.  SURGEON:  Altamese Waldo, MD  ASSISTANT:  Ainsley Spinner, PA-C  SECOND ASSIST: April Green, RNFA  ANESTHESIA:  General.  ESTIMATED BLOOD LOSS:  300 mL.  DISPOSITION:  To PACU.  CONDITION:  Stable.  BRIEF SUMMARY OF INDICATIONS FOR PROCEDURE:  The patient is a very pleasant 76 year old male who remains active with a remotely performed hip arthrodesis and new fracture.  The patient has undergone left total knee arthroplasty, right hip arthroplasty,  and other procedures to maintain a good everyday function.  I discussed with the patient and his sons preoperatively the risks and benefits of surgery including the possibility of infection, nerve injury, vessel injury, DVT, PE, heart attack, stroke,  failure of the fusion, and need for further surgery, among others.  In particular, we discussed the technical difficulties involved in repair of this fracture and the variety of techniques that could be used to perform his stabilization.  BRIEF SUMMARY OF PROCEDURE:  The patient was taken to the operating room where general anesthesia was induced.  His left lower extremity was prepped and draped in the usual sterile fashion After a timeout, I performed a closed reduction, which was near  anatomic.  I selected the longest available blade plate, which was  90 mm at 130 degrees and 12 holes, which was also the longest.  There was concern that I was not going to be able to place this seating chisel appropriately because of the preexisting  hardware and, in particular, an additional fragment in the peritrochanteric region.  I made a lateral incision and carried dissection down through the tensor and vastus in line with the incision, reflecting the musculature anteriorly.  I exposed the  fracture site and placed a pin while the fracture was reduced.  I was careful to watch his flexion, abduction and rotation at all times as I wished to maintain 30 degrees of flexion, slight external rotation and neutral abduction.  The seating chisel was  then placed after carefully retracting soft tissues.  With the initial insertion, the lateral piece of the distal fragment further fractured.  I was unable to seat the chisel appropriately because of the hardware, and this required careful extraction  after use of the osteotomes around it.  The osteotomes literally had to be taken a millimeter by millimeter around the fragment until they could be withdrawn.  It was a soft, somewhat malleable plate, which was bent in a variety of directions and  extremely difficult to extract.  After it was removed, the patient was noted to have further bone loss, particularly around the lateral or tension side.  I was able, however, then to seat the chisel optimally just below the old fractured blade plate and  in the center-center position on the lateral.  This was followed by placement of the actual plate, again watching the shaft to make sure that it was in the desired position.  This substantially medialized the  shaft.  Consequently, I withdrew it and used  the [place] claw to bring the shaft to the plate, and then doubting the reduction was much more, I placed a standard fixation in the proximal aspect of the shaft and then achieved compression up into the head with the use of a tamp on the  plate laterally.   This did medialize the femoral shaft relative to the proximal femur, reducing the biomechanical forces, and as we had obtained the desired reduction, we knew that our alignment was appropriate in spite of the translation.  I then placed 2 additional  screws distally and 60 mL of cancellous graft, filling the visible defect laterally and also posteriorly and copious amounts of graft medially.  Final images showed medial translation of the femur but with appropriate length, rotation, and implant  position.  The patient was awakened from anesthesia and transported to the PACU in stable condition.  Ainsley Spinner, PA-C, assisted me throughout, and assistant was necessary for retraction during extraction of the broken metal, which was extremely  difficult as well as to control the chisel during placement of the blade plate which is also technically demanding, also facilitated definitive fixation and then closure of the wound.  PROGNOSIS:  The patient will be touchdown weightbearing on the left lower extremity with unrestricted range of motion of the knee and ankle.  He will be on formal pharmacologic DVT prophylaxis.  LN/NUANCE  D:04/18/2018 T:04/18/2018 JOB:[id number]

## 2018-04-18 NOTE — Brief Op Note (Signed)
04/15/2018 - 04/18/2018  11:48 AM  PATIENT:  Casey Gilmore  77 y.o. male  PRE-OPERATIVE DIAGNOSIS:  left subtrochenteric femur fracture, retained hardware  POST-OPERATIVE DIAGNOSIS:  left subtrochenteric femur fracture, retained hardware  PROCEDURE:  Procedure(s): 1. OPEN REDUCTION INTERNAL FIXATION LEFT PROXIMAL FEMUR FRACTURE 2. REMOVAL OF HARDWARE LEFT FEMUR (HIP FUSION PLATE) (Left)  SURGEON:  Surgeon(s) and Role:    Altamese Bolivar, MD - Primary  PHYSICIAN ASSISTANT: Ainsley Spinner, PA-C,   ASSISTANTS: April Greene, RNFA   ANESTHESIA:   general  EBL:  300 mL   BLOOD ADMINISTERED:none  DRAINS: none   LOCAL MEDICATIONS USED:  NONE  SPECIMEN:  No Specimen  DISPOSITION OF SPECIMEN:  N/A  COUNTS:  YES  TOURNIQUET:  * No tourniquets in log *  DICTATION: .Other Dictation: Dictation Number [id number]  PLAN OF CARE: Admit to inpatient   PATIENT DISPOSITION:  PACU - hemodynamically stable.   Delay start of Pharmacological VTE agent (>24hrs) due to surgical blood loss or risk of bleeding: no

## 2018-04-18 NOTE — Progress Notes (Signed)
Dr Jillyn Hidden at bedside and gave 2mg  IV Versed. Pt much calmer, dozing at intervals.

## 2018-04-18 NOTE — Anesthesia Procedure Notes (Signed)
Procedure Name: Intubation Date/Time: 04/18/2018 8:40 AM Performed by: Jenne Campus, CRNA Pre-anesthesia Checklist: Patient identified, Emergency Drugs available, Suction available and Patient being monitored Patient Re-evaluated:Patient Re-evaluated prior to induction Oxygen Delivery Method: Circle System Utilized Preoxygenation: Pre-oxygenation with 100% oxygen Induction Type: IV induction Ventilation: Mask ventilation without difficulty Laryngoscope Size: Miller Grade View: Grade I Tube type: Oral Tube size: 7.5 mm Number of attempts: 1 Airway Equipment and Method: Stylet and Oral airway Placement Confirmation: ETT inserted through vocal cords under direct vision,  positive ETCO2 and breath sounds checked- equal and bilateral Secured at: 22 cm Tube secured with: Tape Dental Injury: Teeth and Oropharynx as per pre-operative assessment

## 2018-04-19 ENCOUNTER — Encounter (HOSPITAL_COMMUNITY): Payer: Self-pay | Admitting: Orthopedic Surgery

## 2018-04-19 DIAGNOSIS — S72002D Fracture of unspecified part of neck of left femur, subsequent encounter for closed fracture with routine healing: Secondary | ICD-10-CM

## 2018-04-19 LAB — TYPE AND SCREEN
ABO/RH(D): O POS
ANTIBODY SCREEN: NEGATIVE
UNIT DIVISION: 0
Unit division: 0
Unit division: 0

## 2018-04-19 LAB — CBC
HCT: 27.9 % — ABNORMAL LOW (ref 39.0–52.0)
HCT: 28.4 % — ABNORMAL LOW (ref 39.0–52.0)
HEMOGLOBIN: 8.7 g/dL — AB (ref 13.0–17.0)
Hemoglobin: 8.3 g/dL — ABNORMAL LOW (ref 13.0–17.0)
MCH: 23.6 pg — ABNORMAL LOW (ref 26.0–34.0)
MCH: 24.7 pg — AB (ref 26.0–34.0)
MCHC: 29.7 g/dL — ABNORMAL LOW (ref 30.0–36.0)
MCHC: 30.6 g/dL (ref 30.0–36.0)
MCV: 79.5 fL (ref 78.0–100.0)
MCV: 80.7 fL (ref 78.0–100.0)
PLATELETS: 217 10*3/uL (ref 150–400)
Platelets: 241 10*3/uL (ref 150–400)
RBC: 3.51 MIL/uL — ABNORMAL LOW (ref 4.22–5.81)
RBC: 3.52 MIL/uL — ABNORMAL LOW (ref 4.22–5.81)
RDW: 18.1 % — ABNORMAL HIGH (ref 11.5–15.5)
RDW: 18.5 % — AB (ref 11.5–15.5)
WBC: 7.2 10*3/uL (ref 4.0–10.5)
WBC: 7 10*3/uL (ref 4.0–10.5)

## 2018-04-19 LAB — BASIC METABOLIC PANEL
Anion gap: 6 (ref 5–15)
BUN: 15 mg/dL (ref 6–20)
CALCIUM: 8.3 mg/dL — AB (ref 8.9–10.3)
CHLORIDE: 103 mmol/L (ref 101–111)
CO2: 27 mmol/L (ref 22–32)
CREATININE: 1.14 mg/dL (ref 0.61–1.24)
GFR calc Af Amer: >60 mL/min (ref >60)
GFR calc non Af Amer: >60 mL/min (ref >60)
Glucose, Bld: 164 mg/dL — ABNORMAL HIGH (ref 65–99)
Potassium: 4.6 mmol/L (ref 3.5–5.1)
Sodium: 136 mmol/L (ref 135–145)

## 2018-04-19 LAB — BPAM RBC
BLOOD PRODUCT EXPIRATION DATE: 201906012359
Blood Product Expiration Date: 201906012359
Blood Product Expiration Date: 201906052359
ISSUE DATE / TIME: 201905080823
ISSUE DATE / TIME: 201905111421
ISSUE DATE / TIME: 201905131737
UNIT TYPE AND RH: 5100
UNIT TYPE AND RH: 5100
Unit Type and Rh: 5100

## 2018-04-19 LAB — GLUCOSE, CAPILLARY
GLUCOSE-CAPILLARY: 154 mg/dL — AB (ref 65–99)
GLUCOSE-CAPILLARY: 200 mg/dL — AB (ref 65–99)
GLUCOSE-CAPILLARY: 185 mg/dL — AB (ref 65–99)
Glucose-Capillary: 167 mg/dL — ABNORMAL HIGH (ref 65–99)

## 2018-04-19 LAB — VITAMIN D 25 HYDROXY (VIT D DEFICIENCY, FRACTURES): Vit D, 25-Hydroxy: 37.7 ng/mL (ref 30.0–100.0)

## 2018-04-19 NOTE — Progress Notes (Signed)
PROGRESS NOTE  Casey Gilmore JAS:[id number] DOB: 10/17/1941 DOA: 04/15/2018 PCP: Mayra Neer, MD   LOS: 4 days   Brief Narrative / Interim history: 77 year old male with complex orthopedic history presents to the hospital after falling out of bed and developing severe leg pain.  He was admitted for hip fracture.  He was cleared by cardiology for surgery and underwent operative repair on 5/14.  Assessment & Plan: Principal Problem:   Closed left hip fracture (HCC) Active Problems:   Diabetes mellitus (HCC)   Chronic diastolic heart failure (HCC)   Aortic valve replaced   COPD (chronic obstructive pulmonary disease) with chronic bronchitis (HCC)   Anemia   Left hip fracture -Orthopedic surgery consulted and followed patient, he is status post operative repair on 5/14 by Dr. Marcelino Scot -He is doing relatively well postop, seems to have significant pain when worked with physical therapy last night. -PT recommends SNF, however patient is not fully decided whether this is what he wants to do versus going home.  He lives by himself and his son is nearby  Chronic diastolic CHF -Stable, he appears euvolemic, continue Lasix  COPD -No wheezing, appears stable  Diabetes mellitus, uncontrolled -Continue sliding scale insulin, most recent A1c was 7.6  Iron and B12 deficiency anemia /component of acute blood loss anemia postoperatively -Hemoglobin 8.3 this morning, 9.9 yesterday, continue to monitor, no need for transfusion  History of AS, post bioprosthesis AVR 2012 -Echo reassuring   DVT prophylaxis: Lovenox Code Status: Full code Family Communication: no family at bedside Disposition Plan: SNF vs home 1-2 days   Consultants:   Orthopedic surgery   Procedures:   2D echo:  Study Conclusions - Left ventricle: The cavity size was normal. Wall thickness was increased in a pattern of mild LVH. Systolic function was normal. The estimated ejection fraction was in the range of 60%  to 65%. - Aortic valve: Bioprosthetic valve with acceptable gradients DVI .5 suggesting normal function and no significant peri valvular regurgiation. Valve area (VTI): 0.88 cm^2. Valve area (Vmax): 0.77 cm^2. Valve area (Vmean): 0.73 cm^2. - Mitral valve: Valve area by continuity equation (using LVOT flow): 0.83 cm^2. - Atrial septum: No defect or patent foramen ovale was identified.   ORIF L femur 5/14  Antimicrobials:  None    Subjective: -He is not having any pain if he is laying down, however states that his hip was very painful when he tried to mobilize with PT.  No chest pain, no shortness of breath.  No abdominal pain, nausea or vomiting  Objective: Vitals:   04/18/18 1353 04/18/18 1400 04/18/18 1938 04/19/18 0424  BP:  (!) 165/92 134/70 128/65  Pulse:  98 91 84  Resp:  16    Temp:  (!) 97.5 F (36.4 C) 98.7 F (37.1 C) 98 F (36.7 C)  TempSrc:  Oral Oral   SpO2: 98% 90% 96% 97%  Weight:      Height:        Intake/Output Summary (Last 24 hours) at 04/19/2018 1151 Last data filed at 04/19/2018 0900 Gross per 24 hour  Intake 4125 ml  Output 1600 ml  Net 2525 ml   Filed Weights   04/15/18 1032  Weight: 79.4 kg (175 lb)    Examination:  Constitutional: NAD Eyes: lids and conjunctivae normal ENMT: Mucous membranes are moist.  Neck: normal, supple, no masses, no thyromegaly Respiratory: clear to auscultation bilaterally, no wheezing, no crackles. Normal respiratory effort. No accessory muscle use.  Cardiovascular: Regular rate  and rhythm, no murmurs / rubs / gallops. No LE edema. 2+ pedal pulses.  Abdomen: no tenderness. Bowel sounds positive.  Skin: no rashes Neurologic: equal strength, non focal    Data Reviewed: I have independently reviewed following labs and imaging studies   CBC: Recent Labs  Lab 04/15/18 1050 04/16/18 1340 04/17/18 0347 04/17/18 1239 04/18/18 1803 04/19/18 0309  WBC 10.4 7.8 6.3 8.7 7.9 7.2  NEUTROABS 7.7  --   --   --   --    --   HGB 8.8* 8.2* 8.1* 8.5* 9.9* 8.3*  HCT 29.5* 27.5* 26.6* 27.9* 32.1* 27.9*  MCV 76.8* 77.2* 76.9* 76.4* 77.3* 79.5  PLT 290 256 244 277 247 937   Basic Metabolic Panel: Recent Labs  Lab 04/15/18 1050 04/16/18 1340 04/17/18 0347 04/18/18 1803 04/19/18 0309  NA 141 136 138  --  136  K 4.2 4.7 4.3  --  4.6  CL 106 103 105  --  103  CO2 23 24 26   --  27  GLUCOSE 80 233* 189*  --  164*  BUN 16 18 17   --  15  CREATININE 0.99 1.13 1.09 1.12 1.14  CALCIUM 9.0 9.1 8.8*  --  8.3*  MG  --   --  1.5*  --   --    GFR: Estimated Creatinine Clearance: 59.6 mL/min (by C-G formula based on SCr of 1.14 mg/dL). Liver Function Tests: Recent Labs  Lab 04/17/18 0347  AST 13*  ALT 9*  ALKPHOS 81  BILITOT 0.4  PROT 6.1*  ALBUMIN 3.0*   No results for input(s): LIPASE, AMYLASE in the last 168 hours. No results for input(s): AMMONIA in the last 168 hours. Coagulation Profile: Recent Labs  Lab 04/15/18 1050  INR 1.02   Cardiac Enzymes: No results for input(s): CKTOTAL, CKMB, CKMBINDEX, TROPONINI in the last 168 hours. BNP (last 3 results) No results for input(s): PROBNP in the last 8760 hours. HbA1C: Recent Labs    04/18/18 0256  HGBA1C 7.6*   CBG: Recent Labs  Lab 04/17/18 2156 04/18/18 1222 04/18/18 1601 04/18/18 2109 04/19/18 0627  GLUCAP 184* 207* 269* 192* 154*   Lipid Profile: No results for input(s): CHOL, HDL, LDLCALC, TRIG, CHOLHDL, LDLDIRECT in the last 72 hours. Thyroid Function Tests: No results for input(s): TSH, T4TOTAL, FREET4, T3FREE, THYROIDAB in the last 72 hours. Anemia Panel: No results for input(s): VITAMINB12, FOLATE, FERRITIN, TIBC, IRON, RETICCTPCT in the last 72 hours. Urine analysis:    Component Value Date/Time   COLORURINE YELLOW 10/12/2011 [place] 10/12/2011 1353   LABSPEC 1.026 10/12/2011 1353   PHURINE 5.5 10/12/2011 1353   GLUCOSEU NEGATIVE 10/12/2011 1353   HGBUR NEGATIVE 10/12/2011 1353   BILIRUBINUR  NEGATIVE 10/12/2011 1353   KETONESUR NEGATIVE 10/12/2011 1353   PROTEINUR NEGATIVE 10/12/2011 1353   UROBILINOGEN 1.0 10/12/2011 1353   NITRITE NEGATIVE 10/12/2011 1353   LEUKOCYTESUR NEGATIVE 10/12/2011 1353   Sepsis Labs: Invalid input(s): PROCALCITONIN, LACTICIDVEN  Recent Results (from the past 240 hour(s))  Surgical pcr screen     Status: None   Collection Time: 04/16/18 11:06 PM  Result Value Ref Range Status   MRSA, PCR NEGATIVE NEGATIVE Final   Staphylococcus aureus NEGATIVE NEGATIVE Final    Comment: (NOTE) The Xpert SA Assay (FDA approved for NASAL specimens in patients 75 years of age and older), is one component of a comprehensive surveillance program. It is not intended to diagnose infection nor to guide or monitor treatment.  Performed at [facility], [place] [address], [place], [place] [address]       Radiology Studies: Dg Knee Left Port  Result Date: 04/17/2018 CLINICAL DATA:  Status post fall 2 days ago.  Left hip injury. EXAM: PORTABLE LEFT KNEE - 1-2 VIEW COMPARISON:  None. FINDINGS: The left knee demonstrates a total knee arthroplasty without evidence of hardware failure complication. There is no fracture or dislocation. The alignment is anatomic. There is generalized osteopenia. There is a small joint effusion. IMPRESSION: No acute osseous injury of the left knee. Electronically Signed   By: Kathreen Devoid   On: 04/17/2018 12:38   Dg C-arm 1-60 Min  Result Date: 04/18/2018 CLINICAL DATA:  Open reduction and internal fixation of left femur. EXAM: DG C-ARM 61-120 MIN; LEFT FEMUR 2 VIEWS FLUOROSCOPY TIME:  1 minutes 40 seconds. COMPARISON:  Radiographs of Apr 15, 2018. FINDINGS: Nine intraoperative fluoroscopic images were obtained of the left hip. These images demonstrate surgical internal fixation of proximal left femoral fracture. Improved alignment of fracture components is noted. IMPRESSION: Status post surgical internal fixation of proximal left femoral  shaft fracture. Electronically Signed   By: Marijo Conception, M.D.   On: 04/18/2018 13:16   Dg C-arm 1-60 Min  Result Date: 04/18/2018 CLINICAL DATA:  Open reduction and internal fixation of left femur. EXAM: DG C-ARM 61-120 MIN; LEFT FEMUR 2 VIEWS FLUOROSCOPY TIME:  1 minutes 40 seconds. COMPARISON:  Radiographs of Apr 15, 2018. FINDINGS: Nine intraoperative fluoroscopic images were obtained of the left hip. These images demonstrate surgical internal fixation of proximal left femoral fracture. Improved alignment of fracture components is noted. IMPRESSION: Status post surgical internal fixation of proximal left femoral shaft fracture. Electronically Signed   By: Marijo Conception, M.D.   On: 04/18/2018 13:16   Dg C-arm 1-60 Min  Result Date: 04/18/2018 CLINICAL DATA:  Open reduction and internal fixation of left femur. EXAM: DG C-ARM 61-120 MIN; LEFT FEMUR 2 VIEWS FLUOROSCOPY TIME:  1 minutes 40 seconds. COMPARISON:  Radiographs of Apr 15, 2018. FINDINGS: Nine intraoperative fluoroscopic images were obtained of the left hip. These images demonstrate surgical internal fixation of proximal left femoral fracture. Improved alignment of fracture components is noted. IMPRESSION: Status post surgical internal fixation of proximal left femoral shaft fracture. Electronically Signed   By: Marijo Conception, M.D.   On: 04/18/2018 13:16   Dg Hip Unilat With Pelvis 2-3 Views Left  Result Date: 04/18/2018 CLINICAL DATA:  Hip surgery. EXAM: DG HIP (WITH OR WITHOUT PELVIS) 2-3V LEFT COMPARISON:  04/15/2018. FINDINGS: ORIF left hip/proximal femur. Near anatomic alignment. Some degree of callus formation is noted. Fracture lucency remains. Prior total right hip replacement. IMPRESSION: 1. ORIF left hip/proximal femur. Near anatomic alignment. Some degree of callus formation is noted. 2.  Prior total right hip replacement. Electronically Signed   By: Marcello Moores  Register   On: 04/18/2018 15:52   Dg Femur Min 2 Views  Left  Result Date: 04/18/2018 CLINICAL DATA:  Open reduction and internal fixation of left femur. EXAM: DG C-ARM 61-120 MIN; LEFT FEMUR 2 VIEWS FLUOROSCOPY TIME:  1 minutes 40 seconds. COMPARISON:  Radiographs of Apr 15, 2018. FINDINGS: Nine intraoperative fluoroscopic images were obtained of the left hip. These images demonstrate surgical internal fixation of proximal left femoral fracture. Improved alignment of fracture components is noted. IMPRESSION: Status post surgical internal fixation of proximal left femoral shaft fracture. Electronically Signed   By: Marijo Conception, M.D.  On: 04/18/2018 13:16     Scheduled Meds: . acetaminophen  650 mg Oral Q6H  . acetaminophen  650 mg Oral Once  . docusate sodium  100 mg Oral BID  . enoxaparin (LOVENOX) injection  30 mg Subcutaneous Q24H  . ferrous sulfate  325 mg Oral BID WC  . furosemide  40 mg Oral Daily  . insulin aspart  0-5 Units Subcutaneous QHS  . insulin aspart  0-9 Units Subcutaneous TID WC  . pantoprazole  40 mg Oral Daily  . pravastatin  20 mg Oral Daily  . cyanocobalamin  4,000 mcg Oral Daily  . Vitamin D (Ergocalciferol)  50,000 Units Oral Q7 days   Continuous Infusions: . sodium chloride    . 0.9 % NaCl with KCl 20 mEq / L 100 mL/hr at 04/19/18 0153     Marzetta Board, MD, PhD [facility] Pager [phone]  If 7PM-7AM, please contact night-coverage [email or web address] Password [facility], [place] 04/19/2018, 11:51 AM

## 2018-04-19 NOTE — Progress Notes (Signed)
Physical Therapy Treatment Patient Details Name: Casey Gilmore MRN: [id number] DOB: 1941/03/01 Today's Date: 04/19/2018    History of Present Illness Pt is a 77 y/o male with pmh significant for L hip fusion as a youth, COPD, DM, NSTEMI, vocal cord CA, HTN, recent fall with L humeral and radial/ulnar fx's. He was admitted after falling OOB sustaining L proximal peritrochanteric femur fx's, s/p ORIF and removal of fusion plating from childhood.    PT Comments    Pt making minimal progress with functional mobility. He remains very limited secondary to pain. Pt continues to require heavy physical assistance of two for bed mobility. He is unable to tolerate upright sitting or transfers at this time. Pt would continue to benefit from skilled physical therapy services at this time while admitted and after d/c to address the below listed limitations in order to improve overall safety and independence with functional mobility.    Follow Up Recommendations  SNF;Supervision/Assistance - 24 hour     Equipment Recommendations  None recommended by PT    Recommendations for Other Services       Precautions / Restrictions Precautions Precautions: Fall Restrictions Weight Bearing Restrictions: Yes LLE Weight Bearing: Touchdown weight bearing    Mobility  Bed Mobility Overal bed mobility: Needs Assistance Bed Mobility: Supine to Sit;Sit to Supine     Supine to sit: Max assist;+2 for physical assistance Sit to supine: Total assist;+2 for physical assistance   General bed mobility comments: Pt able to initiate movement and attempt bridging to transition to EOB but requiring max assist +2 to complete sit<>supine. Difficulty achieving fully upright position secondary to pain and decreased L hip flexion.   Transfers                 General transfer comment: unable  Ambulation/Gait             General Gait Details: unable   Stairs             Wheelchair Mobility     Modified Rankin (Stroke Patients Only)       Balance Overall balance assessment: Needs assistance Sitting-balance support: Feet supported;Bilateral upper extremity supported Sitting balance-Leahy Scale: Poor Sitting balance - Comments: heavy R lateral lean Postural control: Right lateral lean;Posterior lean                                  Cognition Arousal/Alertness: Awake/alert Behavior During Therapy: WFL for tasks assessed/performed Overall Cognitive Status: No family/caregiver present to determine baseline cognitive functioning Area of Impairment: Memory;Following commands;Awareness;Problem solving                     Memory: Decreased short-term memory Following Commands: Follows multi-step commands inconsistently   Awareness: Emergent Problem Solving: Slow processing;Difficulty sequencing General Comments: Pt with difficulty reporting full understanding of his current situation.       Exercises      General Comments General comments (skin integrity, edema, etc.): Brother arriving at end of session.       Pertinent Vitals/Pain Pain Assessment: Faces Faces Pain Scale: Hurts whole lot Pain Location: L hip Pain Descriptors / Indicators: Sharp;Sore Pain Intervention(s): Monitored during session;Repositioned    Home Living Family/patient expects to be discharged to:: Private residence Living Arrangements: Alone Available Help at Discharge: Family;Available PRN/intermittently Type of Home: House Home Access: Stairs to enter Entrance Stairs-Rails: Left;Right Home Layout: One level Home Equipment: Kasandra Knudsen -  single point      Prior Function Level of Independence: Independent with assistive device(s)          PT Goals (current goals can now be found in the care plan section) Acute Rehab PT Goals Patient Stated Goal: feel better PT Goal Formulation: With patient Time For Goal Achievement: 05/02/18 Potential to Achieve Goals:  Fair Progress towards PT goals: Progressing toward goals    Frequency    Min 2X/week      PT Plan Frequency needs to be updated    Co-evaluation PT/OT/SLP Co-Evaluation/Treatment: Yes Reason for Co-Treatment: For patient/therapist safety;To address functional/ADL transfers PT goals addressed during session: Mobility/safety with mobility;Balance;Strengthening/ROM        AM-PAC PT "6 Clicks" Daily Activity  Outcome Measure  Difficulty turning over in bed (including adjusting bedclothes, sheets and blankets)?: Unable Difficulty moving from lying on back to sitting on the side of the bed? : Unable Difficulty sitting down on and standing up from a chair with arms (e.g., wheelchair, bedside commode, etc,.)?: Unable Help needed moving to and from a bed to chair (including a wheelchair)?: Total Help needed walking in hospital room?: Total Help needed climbing 3-5 steps with a railing? : Total 6 Click Score: 6    End of Session   Activity Tolerance: Patient limited by pain Patient left: in bed;with call bell/phone within reach;with family/visitor present Nurse Communication: Mobility status PT Visit Diagnosis: Other abnormalities of gait and mobility (R26.89);Pain Pain - Right/Left: Left Pain - part of body: Hip     Time: 4492-0100 PT Time Calculation (min) (ACUTE ONLY): 29 min  Charges:  $Therapeutic Activity: 8-22 mins                    G Codes:       [place], [place], [place] 04/19/2018, 5:27 PM

## 2018-04-19 NOTE — Plan of Care (Signed)
  Problem: Pain Managment: Goal: General experience of comfort will improve Outcome: Progressing   Problem: Safety: Goal: Ability to remain free from injury will improve Outcome: Progressing   

## 2018-04-19 NOTE — Progress Notes (Signed)
Patient doing well, pod 1.  Some pain.   EHL intact.  Scant drainage.  Appreciate Dr. Carlean Jews help.    WB per Dr. Marcelino Scot.    Johnny Bridge, MD

## 2018-04-19 NOTE — Plan of Care (Signed)

## 2018-04-19 NOTE — Care Management Important Message (Signed)
Important Message  Patient Details  Name: Casey Gilmore MRN: [id number] Date of Birth: 12/09/1940   Medicare Important Message Given:  Yes    Malissie Musgrave 04/19/2018, 1:03 PM

## 2018-04-19 NOTE — NC FL2 (Signed)
MEDICAID FL2 LEVEL OF CARE SCREENING TOOL     IDENTIFICATION  Patient Name: Casey Gilmore Birthdate: 1941-05-12 Sex: male Admission Date (Current Location): 04/15/2018  [facility] [place] Number:  Herbalist and Address:  [facility], [place] [address] [place], [place] [address]      Provider Number: [id number]  Attending Physician Name and Address:  Caren Griffins, MD  Relative Name and Phone Number:       Current Level of Care: Hospital Recommended Level of Care: [place] Prior Approval Number:    Date Approved/Denied:   PASRR Number: [id number]  Discharge Plan: SNF    Current Diagnoses: Patient Active Problem List   Diagnosis Date Noted  . Anemia 04/17/2018  . Closed left hip fracture ([place]) 04/15/2018  . Closed fracture of left proximal humerus 01/19/2018  . Closed fracture of left distal radius and ulna 01/19/2018  . 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 COPD (chronic obstructive pulmonary disease) with chronic bronchitis ([place]) 10/29/2014  . Chronic diastolic heart failure ([place]) 06/24/2014  . Benign essential HTN 06/24/2014  . 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 History of non-ST elevation myocardial infarction (NSTEMI) 06/24/2014  . Diabetes mellitus ([place]) 10/17/2011  . Osteoarthritis 10/17/2011    Orientation RESPIRATION BLADDER Height & Weight     Self, Time, Situation, Place  Normal Continent, External catheter(catheter placed 04/18/18) Weight: 175 lb (79.4 kg) Height:  6' (182.9 cm)  BEHAVIORAL SYMPTOMS/MOOD NEUROLOGICAL BOWEL NUTRITION STATUS      Continent Diet(carb modified)  AMBULATORY STATUS COMMUNICATION OF NEEDS Skin   Extensive Assist Verbally Surgical wounds(closed left hip incision, silver hydrofiber dressing)                       Personal Care Assistance Level of Assistance  Bathing, Feeding, Dressing Bathing Assistance: Maximum  assistance Feeding assistance: Limited assistance Dressing Assistance: Maximum assistance     Functional Limitations Info  Sight, Hearing, Speech Sight Info: Adequate Hearing Info: Impaired(hard of hearing) Speech Info: Adequate    SPECIAL CARE FACTORS FREQUENCY  PT (By licensed PT), OT (By licensed OT)     PT Frequency: 5x/wk OT Frequency: 5x/wk            Contractures Contractures Info: Not present    Additional Factors Info  Code Status, Allergies, Insulin Sliding Scale Code Status Info: Full Allergies Info: NKA   Insulin Sliding Scale Info: 0-9 units 3x/day with meals; 0-5 units daily at bedtime       Current Medications (04/19/2018):  This is the current hospital active medication list Current Facility-Administered Medications  Medication Dose Route Frequency Provider Last Rate Last Dose  . 0.9 %  sodium chloride infusion   Intravenous Once Ainsley Spinner, PA-C      . 0.9 % NaCl with KCl 20 mEq/ L  infusion   Intravenous Continuous Ainsley Spinner, PA-C 100 mL/hr at 04/19/18 0153    . acetaminophen (TYLENOL) tablet 650 mg  650 mg Oral Q6H Ainsley Spinner, PA-C   650 mg at 04/19/18 0754  . acetaminophen (TYLENOL) tablet 650 mg  650 mg Oral Once Ainsley Spinner, PA-C      . docusate sodium (COLACE) capsule 100 mg  100 mg Oral BID Ainsley Spinner, PA-C   100 mg at 04/19/18 0801  . enoxaparin (LOVENOX) injection 30 mg  30 mg Subcutaneous Q24H Ainsley Spinner, PA-C   30 mg at 04/19/18 0758  . ferrous  sulfate tablet 325 mg  325 mg Oral BID WC Ainsley Spinner, PA-C   325 mg at 04/19/18 0757  . furosemide (LASIX) tablet 40 mg  40 mg Oral Daily Ainsley Spinner, PA-C   40 mg at 04/19/18 0801  . insulin aspart (novoLOG) injection 0-5 Units  0-5 Units Subcutaneous QHS Ainsley Spinner, PA-C      . insulin aspart (novoLOG) injection 0-9 Units  0-9 Units Subcutaneous TID WC Ainsley Spinner, PA-C   2 Units at 04/19/18 0757  . morphine 2 MG/ML injection 2 mg  2 mg Intravenous Q3H PRN Ainsley Spinner, PA-C   2 mg at 04/18/18  1725  . ondansetron (ZOFRAN) tablet 4 mg  4 mg Oral Q6H PRN Ainsley Spinner, PA-C       Or  . ondansetron [facility]) injection 4 mg  4 mg Intravenous Q6H PRN Ainsley Spinner, PA-C   4 mg at 04/18/18 1424  . ondansetron (ZOFRAN) tablet 4 mg  4 mg Oral Q8H PRN Ainsley Spinner, PA-C      . oxyCODONE (Oxy IR/ROXICODONE) immediate release tablet 5-10 mg  5-10 mg Oral Q4H PRN Ainsley Spinner, PA-C   10 mg at 04/19/18 0754  . pantoprazole (PROTONIX) EC tablet 40 mg  40 mg Oral Daily Ainsley Spinner, PA-C   40 mg at 04/19/18 0801  . pravastatin (PRAVACHOL) tablet 20 mg  20 mg Oral Daily Ainsley Spinner, PA-C   20 mg at 04/19/18 0801  . sorbitol 70 % solution 30 mL  30 mL Oral Daily PRN Ainsley Spinner, PA-C      . vitamin B-12 (CYANOCOBALAMIN) tablet 4,000 mcg  4,000 mcg Oral Daily Ainsley Spinner, PA-C   4,000 mcg at 04/19/18 0800  . Vitamin D (Ergocalciferol) (DRISDOL) capsule 50,000 Units  50,000 Units Oral Q7 days Ainsley Spinner, PA-C   50,000 Units at 04/16/18 1042     Discharge Medications: Please see discharge summary for a list of discharge medications.  Relevant Imaging Results:  Relevant Lab Results:   Additional Information SS#: [id number]  Geralynn Ochs, LCSW

## 2018-04-19 NOTE — Progress Notes (Addendum)
Orthopedic Trauma Service Progress Note   Patient ID: Casey Gilmore MRN: [id number] DOB/AGE: [birth year] 77 y.o.  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   Weight as of this encounter: 79.4 kg (175 lb).   Intake/Output      05/14 0701 - 05/15 0700 05/15 0701 - 05/16 0700   P.O. 480    I.V. (mL/kg) 4865 (61.3)    Blood     IV Piggyback 50    Total Intake(mL/kg) 5395 (67.9)    Urine (mL/kg/hr) 1100 (0.6)    Blood 300    Total Output 1400    Net +3995           LABS  Results for orders placed or performed during the hospital encounter of 04/15/18 (from the past 24 hour(s))  Glucose, capillary     Status: Abnormal   Collection Time: 04/18/18 12:22 PM  Result Value Ref Range   Glucose-Capillary 207 (H) 65 - 99 mg/dL  Glucose, capillary     Status: Abnormal   Collection Time: 04/18/18  4:01 PM  Result Value Ref Range   Glucose-Capillary 269 (H) 65 - 99 mg/dL  CBC     Status: Abnormal   Collection Time: 04/18/18  6:03 PM  Result Value Ref Range   WBC 7.9 4.0 - 10.5 K/uL   RBC 4.15 (L) 4.22 - 5.81 MIL/uL   Hemoglobin 9.9 (L) 13.0 - 17.0 g/dL   HCT 32.1 (L) 39.0 - 52.0 %   MCV 77.3 (L) 78.0 - 100.0 fL   MCH 23.9 (L) 26.0 - 34.0 pg   MCHC 30.8 30.0 - 36.0 g/dL   RDW 17.5 (H) 11.5 - 15.5 %   Platelets 247 150 - 400 K/uL  Creatinine, serum     Status: None    Collection Time: 04/18/18  6:03 PM  Result Value Ref Range   Creatinine, Ser 1.12 0.61 - 1.24 mg/dL   GFR calc non Af Amer >60 >60 mL/min   GFR calc Af Amer >60 >60 mL/min  Glucose, capillary     Status: Abnormal   Collection Time: 04/18/18  9:09 PM  Result Value Ref Range   Glucose-Capillary 192 (H) 65 - 99 mg/dL  Basic metabolic panel     Status: Abnormal   Collection Time: 04/19/18  3:09 AM  Result Value Ref Range   Sodium 136 135 - 145 mmol/L   Potassium 4.6 3.5 - 5.1 mmol/L   Chloride 103 101 - 111 mmol/L   CO2 27 22 - 32 mmol/L   Glucose, Bld 164 (H) 65 - 99 mg/dL   BUN 15 6 - 20 mg/dL   Creatinine, Ser 1.14 0.61 -  1.24 mg/dL   Calcium 8.3 (L) 8.9 - 10.3 mg/dL   GFR calc non Af Amer >60 >60 mL/min   GFR calc Af Amer >60 >60 mL/min   Anion gap 6 5 - 15  CBC     Status: Abnormal   Collection Time: 04/19/18  3:09 AM  Result Value Ref Range   WBC 7.2 4.0 - 10.5 K/uL   RBC 3.51 (L) 4.22 - 5.81 MIL/uL   Hemoglobin 8.3 (L) 13.0 - 17.0 g/dL   HCT 27.9 (L) 39.0 - 52.0 %   MCV 79.5 78.0 - 100.0 fL   MCH 23.6 (L) 26.0 - 34.0 pg   MCHC 29.7 (L) 30.0 - 36.0 g/dL   RDW 18.1 (H) 11.5 - 15.5 %   Platelets 241 150 - 400 K/uL  Glucose, capillary     Status: Abnormal   Collection Time: 04/19/18  6:27 AM  Result Value Ref Range   Glucose-Capillary 154 (H) 65 - 99 mg/dL     PHYSICAL EXAM:  Gen: resting comfortably in bed, NAD, appears well Lungs: breathing unlabored, anterior fields are clear  Cardiac: regular  Abd: + BS, NTND, foley in place  Ext:       Left Lower Extremity   Dressing c/d/i  Ext warm   + DP pulse  Moderate swelling distally   EHL, FHL, AT, PT, peroneals, gastroc motor intact   DPN, SPN, TN sensation intact  Resting position of leg appears appropriate. Rotation and length look good (L leg shorter at baseline due to hip fusion)  Assessment/Plan: 1 Day Post-Op   Principal Problem:   Closed left hip fracture (HCC) Active Problems:   Diabetes mellitus  (HCC)   Chronic diastolic heart failure (HCC)   Aortic valve replaced   COPD (chronic obstructive pulmonary disease) with chronic bronchitis (HCC)   Anemia   Anti-infectives (From admission, onward)   Start     Dose/Rate Route Frequency Ordered Stop   04/18/18 1500  ceFAZolin (ANCEF) IVPB 1 g/50 mL premix     1 g 100 mL/hr over 30 Minutes Intravenous Every 6 hours 04/18/18 1352 04/19/18 0330   04/17/18 1200  ceFAZolin (ANCEF) IVPB 2g/100 mL premix     2 g 200 mL/hr over 30 Minutes Intravenous To Surgery 04/17/18 1152 04/18/18 0905    .  POD/HD#: 1    77 y/o white male s/p fall with L subtrochanteric femur fracture around hip fusion hardware. Approximately 3 months post op for L proximal humerus fracture and L distal radius fracture    - fall out of bed on 04/15/2018   -L subtrochanteric femur fracture, retained hip fusion hardware (place in 1950's) and broken hardware s/p ORIF L femur               TDWB L leg  ROM L knee and ankle as tolerated  PT/OT  TED hose   Ice PRN   Dressing change tomorrow    SW eval for SNF    - Pain management:             Titrate as needed   Current regimen appears effective               - ABL anemia/Hemodynamics             Cbc this pm   Monitor  Suspect will need more blood    - Medical issues              Per primary    -  DVT/PE prophylaxis:            Lovenox x 28 days    - ID:              periop abx to be completed today    - Metabolic Bone Disease:             vitamin d looks good   Suspect L femur bone quality is suboptimal as he doesn't bear full weight through it     - Activity:             TDWB L leg    - FEN/GI prophylaxis/Foley/Lines:             CHO mod diet  Dc foley after pt works with therapy                - Dispo:             PT/OT evals  Suspect he will need SNF   Jari Pigg, PA-C [facility] [phone] ([phone] Levi Aland (C) 04/19/2018, 9:38 AM

## 2018-04-19 NOTE — Evaluation (Signed)
Occupational Therapy Evaluation Patient Details Name: Casey Gilmore MRN: [id number] DOB: 1941-08-22 Today's Date: 04/19/2018    History of Present Illness Pt is a 77 y/o male with pmh significant for L hip fusion as a youth, COPD, DM, NSTEMI, vocal cord CA, HTN, recent fall with L humeral and radial/ulnar fx's. He was admitted after falling OOB sustaining L proximal peritrochanteric femur fx's, s/p ORIF and removal of fusion plating from childhood.   Clinical Impression   PTA, pt was independent with cane for ADL and functional mobility. Pt with limited L UE use due to recent ORIF of L proximal humeral fracture and distal radius fracture in February and remains NWB on L UE per RN. Pt currently requiring max assist for bed mobility to move toward EOB and up to mod assist to maintain balance while seated and unable to achieve fully upright position due to decreased L knee and hip ROM as well as pain. Pt currently requiring total assist for LB ADL and mod assist for UB ADL at bed level. At current functional level, pt will need significant assistance and rehabilitation to return to PLOF and recommend short-term SNF level rehabilitation. OT will continue to follow while admitted.     Follow Up Recommendations  SNF;Supervision/Assistance - 24 hour    Equipment Recommendations  Other (comment)(defer to next venue of care)    Recommendations for Other Services       Precautions / Restrictions Precautions Precautions: Fall Restrictions Weight Bearing Restrictions: Yes LLE Weight Bearing: Touchdown weight bearing      Mobility Bed Mobility Overal bed mobility: Needs Assistance Bed Mobility: Supine to Sit;Sit to Supine     Supine to sit: Max assist;+2 for physical assistance Sit to supine: Total assist;+2 for physical assistance   General bed mobility comments: Pt able to initiate movement and attempt bridging to transition to EOB but requiring max assist +2 to complete sit<>supine.  Difficulty achieving fully upright position secondary to pain and decreased L knee extension.   Transfers                 General transfer comment: unable    Balance                                           ADL either performed or assessed with clinical judgement   ADL Overall ADL's : Needs assistance/impaired Eating/Feeding: Minimal assistance;Bed level   Grooming: Minimal assistance;Bed level   Upper Body Bathing: Maximal assistance;Bed level   Lower Body Bathing: Total assistance;Bed level   Upper Body Dressing : Maximal assistance;Bed level   Lower Body Dressing: Total assistance;Bed level                 General ADL Comments: Pt able to sit at EOB but unable to achieve midline positioning due to pain and limited L knee ROM.      Vision Patient Visual Report: No change from baseline Vision Assessment?: No apparent visual deficits     Perception     Praxis      Pertinent Vitals/Pain Pain Assessment: Faces Faces Pain Scale: Hurts whole lot Pain Location: L hip Pain Descriptors / Indicators: Sharp;Sore Pain Intervention(s): Limited activity within patient's tolerance;Monitored during session;Repositioned     Hand Dominance Right   Extremity/Trunk Assessment Upper Extremity Assessment Upper Extremity Assessment: LUE deficits/detail LUE Deficits / Details: 3 months post wrist and  shoulder surgery. Pt not to push/pull with L UE. Limited AROM.    Lower Extremity Assessment Lower Extremity Assessment: Defer to PT evaluation       Communication Communication Communication: No difficulties   Cognition Arousal/Alertness: Awake/alert Behavior During Therapy: WFL for tasks assessed/performed Overall Cognitive Status: No family/caregiver present to determine baseline cognitive functioning Area of Impairment: Memory;Following commands;Awareness;Problem solving                     Memory: Decreased short-term  memory Following Commands: Follows multi-step commands inconsistently   Awareness: Emergent Problem Solving: Slow processing;Difficulty sequencing General Comments: Pt with difficulty reporting full understanding of his current situation.    General Comments  Brother arriving at end of session.     Exercises     Shoulder Instructions      Home Living Family/patient expects to be discharged to:: Private residence Living Arrangements: Alone Available Help at Discharge: Family;Available PRN/intermittently Type of Home: House Home Access: Stairs to enter [employer] of Steps: 4 Entrance Stairs-Rails: Left;Right Home Layout: One level     Bathroom Shower/Tub: Occupational psychologist: Handicapped height     Home Equipment: Cane - single point          Prior Functioning/Environment Level of Independence: Independent with assistive device(s)                 OT Problem List: Decreased strength;Decreased range of motion;Decreased activity tolerance;Impaired balance (sitting and/or standing);Decreased safety awareness;Decreased knowledge of use of DME or AE;Decreased knowledge of precautions;Pain      OT Treatment/Interventions: Self-care/ADL training;Therapeutic exercise;Energy conservation;DME and/or AE instruction;Therapeutic activities;Patient/family education;Balance training    OT Goals(Current goals can be found in the care plan section) Acute Rehab OT Goals Patient Stated Goal: feel better OT Goal Formulation: With patient Time For Goal Achievement: 05/03/18 Potential to Achieve Goals: Good ADL Goals Pt Will Perform Grooming: sitting;with min guard assist Pt Will Perform Upper Body Bathing: with min assist;sitting Pt Will Perform Lower Body Bathing: with mod assist;sit to/from stand Pt Will Transfer to Toilet: with mod assist;stand pivot transfer;bedside commode Additional ADL Goal #1: Pt will complete bed mobility with overall min assist  in preparation for ADL seated at EOB.  OT Frequency: Min 2X/week   Barriers to D/C:            Co-evaluation              AM-PAC PT "6 Clicks" Daily Activity     Outcome Measure Help from another person eating meals?: A Little Help from another person taking care of personal grooming?: A Little Help from another person toileting, which includes using toliet, bedpan, or urinal?: Total Help from another person bathing (including washing, rinsing, drying)?: A Lot Help from another person to put on and taking off regular upper body clothing?: A Lot Help from another person to put on and taking off regular lower body clothing?: Total 6 Click Score: 12   End of Session Nurse Communication: Mobility status  Activity Tolerance: Patient tolerated treatment well Patient left: in chair;with call bell/phone within reach  OT Visit Diagnosis: Other abnormalities of gait and mobility (R26.89);Pain;Other symptoms and signs involving cognitive function Pain - Right/Left: Left Pain - part of body: Hip                Time: 4098-1191 OT Time Calculation (min): 28 min Charges:  OT General Charges $OT Visit: 1 Visit OT Evaluation $OT Eval Moderate Complexity: 1  Mod G-Codes:     Norman Herrlich, MS OTR/L  Pager: [place] Duce 04/19/2018, 4:36 PM

## 2018-04-20 LAB — CBC
HCT: 27.4 % — ABNORMAL LOW (ref 39.0–52.0)
Hemoglobin: 8.2 g/dL — ABNORMAL LOW (ref 13.0–17.0)
MCH: 23.9 pg — ABNORMAL LOW (ref 26.0–34.0)
MCHC: 29.9 g/dL — ABNORMAL LOW (ref 30.0–36.0)
MCV: 79.9 fL (ref 78.0–100.0)
PLATELETS: 232 10*3/uL (ref 150–400)
RBC: 3.43 MIL/uL — ABNORMAL LOW (ref 4.22–5.81)
RDW: 19 % — ABNORMAL HIGH (ref 11.5–15.5)
WBC: 7.4 10*3/uL (ref 4.0–10.5)

## 2018-04-20 LAB — BASIC METABOLIC PANEL
Anion gap: 8 (ref 5–15)
BUN: 15 mg/dL (ref 6–20)
CO2: 28 mmol/L (ref 22–32)
CREATININE: 1.15 mg/dL (ref 0.61–1.24)
Calcium: 8.3 mg/dL — ABNORMAL LOW (ref 8.9–10.3)
Chloride: 99 mmol/L — ABNORMAL LOW (ref 101–111)
GFR calc Af Amer: >60 mL/min (ref >60)
GFR, EST NON AFRICAN AMERICAN: 60 mL/min — AB (ref >60)
Glucose, Bld: 165 mg/dL — ABNORMAL HIGH (ref 65–99)
POTASSIUM: 4.2 mmol/L (ref 3.5–5.1)
SODIUM: 135 mmol/L (ref 135–145)

## 2018-04-20 LAB — GLUCOSE, CAPILLARY
Glucose-Capillary: 162 mg/dL — ABNORMAL HIGH (ref 65–99)
Glucose-Capillary: 149 mg/dL — ABNORMAL HIGH (ref 65–99)
Glucose-Capillary: 193 mg/dL — ABNORMAL HIGH (ref 65–99)
Glucose-Capillary: 160 mg/dL — ABNORMAL HIGH (ref 65–99)

## 2018-04-20 MED ORDER — ENOXAPARIN SODIUM 40 MG/0.4ML ~~LOC~~ SOLN
40.0000 mg | SUBCUTANEOUS | Status: DC
  Administered 2018-04-21 – 2018-04-24 (×4): 40 mg via SUBCUTANEOUS
  Filled 2018-04-20 (×4): qty 0.4

## 2018-04-20 NOTE — Progress Notes (Addendum)
Orthopedic Trauma Service Progress Note   Patient ID: OZIAH VITANZA MRN: [id number] DOB/AGE: 07/22/1941 77 y.o.  Subjective:  Doing ok No new issues  Even though son lives next to pt he does work so 24 hours supervision unavailable Pt realizes that he needs SNF/rehab  Pt ambulates on L toes at baseline due to hip fusion. He his TDWB post op. He states that preinjury he was ambulating without assistive devices or a cane at the most. Think CIR might be good for him   Appetite ok Voiding w/o difficulty   Review of Systems  Constitutional: Negative for chills and fever.  Respiratory: Negative for shortness of breath.   Cardiovascular: Negative for chest pain and palpitations.  Gastrointestinal: Negative for nausea and vomiting.  Neurological: Negative for tingling and sensory change.    Objective:   VITALS:   Vitals:   04/19/18 1400 04/19/18 2009 04/20/18 0323 04/20/18 0804  BP: (!) 109/55 108/77 (!) 104/57 (!) 108/56  Pulse: 88 93 86 98  Resp: 16     Temp: 97.9 F (36.6 C) 98.7 F (37.1 C) 98.6 F (37 C)   TempSrc: Oral Oral Oral   SpO2: 100% 90% 100%   Weight:      Height:        Estimated body mass index is 23.73 kg/m as calculated from the following:   Height as of this encounter: 6' (1.829 m).   Weight as of this encounter: 79.4 kg (175 lb).   Intake/Output      05/15 0701 - 05/16 0700 05/16 0701 - 05/17 0700   P.O. 960    I.V. (mL/kg)     IV Piggyback     Total Intake(mL/kg) 960 (12.1)    Urine (mL/kg/hr) 4050 (2.1)    Blood     Total Output 4050    Net -3090           LABS  Results for orders placed or performed during the hospital encounter of 04/15/18 (from the past 24 hour(s))  Glucose, capillary     Status: Abnormal   Collection Time: 04/19/18 11:58 AM  Result Value Ref Range   Glucose-Capillary 200 (H) 65 - 99 mg/dL  CBC     Status: Abnormal   Collection Time: 04/19/18  2:15 PM  Result Value Ref Range   WBC  7.0 4.0 - 10.5 K/uL   RBC 3.52 (L) 4.22 - 5.81 MIL/uL   Hemoglobin 8.7 (L) 13.0 - 17.0 g/dL   HCT 28.4 (L) 39.0 - 52.0 %   MCV 80.7 78.0 - 100.0 fL   MCH 24.7 (L) 26.0 - 34.0 pg   MCHC 30.6 30.0 - 36.0 g/dL   RDW 18.5 (H) 11.5 - 15.5 %   Platelets 217 150 - 400 K/uL  Glucose, capillary     Status: Abnormal   Collection Time: 04/19/18  4:09 PM  Result Value Ref Range   Glucose-Capillary 167 (H) 65 - 99 mg/dL   Comment 1 Notify RN    Comment 2 Document in Chart   Glucose, capillary     Status: Abnormal   Collection Time: 04/19/18  9:55 PM  Result Value Ref Range   Glucose-Capillary 185 (H) 65 - 99 mg/dL  Basic metabolic panel     Status: Abnormal   Collection Time: 04/20/18  3:39 AM  Result Value Ref Range   Sodium 135 135 - 145 mmol/L   Potassium 4.2 3.5 - 5.1 mmol/L   Chloride 99 (L) 101 - 111  mmol/L   CO2 28 22 - 32 mmol/L   Glucose, Bld 165 (H) 65 - 99 mg/dL   BUN 15 6 - 20 mg/dL   Creatinine, Ser 1.15 0.61 - 1.24 mg/dL   Calcium 8.3 (L) 8.9 - 10.3 mg/dL   GFR calc non Af Amer 60 (L) >60 mL/min   GFR calc Af Amer >60 >60 mL/min   Anion gap 8 5 - 15  CBC     Status: Abnormal   Collection Time: 04/20/18  3:39 AM  Result Value Ref Range   WBC 7.4 4.0 - 10.5 K/uL   RBC 3.43 (L) 4.22 - 5.81 MIL/uL   Hemoglobin 8.2 (L) 13.0 - 17.0 g/dL   HCT 27.4 (L) 39.0 - 52.0 %   MCV 79.9 78.0 - 100.0 fL   MCH 23.9 (L) 26.0 - 34.0 pg   MCHC 29.9 (L) 30.0 - 36.0 g/dL   RDW 19.0 (H) 11.5 - 15.5 %   Platelets 232 150 - 400 K/uL  Glucose, capillary     Status: Abnormal   Collection Time: 04/20/18  6:08 AM  Result Value Ref Range   Glucose-Capillary 162 (H) 65 - 99 mg/dL     PHYSICAL EXAM:   Gen: resting comfortably in bed, NAD, appears well Lungs: breathing unlabored, anterior fields are clear  Cardiac: regular  Abd: + BS, NTND Ext:       Left Lower Extremity              Dressing removed   Incision looks fantastic   Wound edges well approximated   Scant bloody drainage   No  erythema or other signs of infection              Ext warm              + DP pulse             Moderate swelling distally              EHL, FHL, AT, PT, peroneals, gastroc motor intact              DPN, SPN, TN sensation intact             Resting position of leg appears appropriate. Rotation and length look good (L leg shorter at baseline due to hip fusion)  Assessment/Plan: 2 Days Post-Op   Principal Problem:   Closed left hip fracture (HCC) Active Problems:   Diabetes mellitus (HCC)   Chronic diastolic heart failure (HCC)   Aortic valve replaced   COPD (chronic obstructive pulmonary disease) with chronic bronchitis (HCC)   Anemia   Anti-infectives (From admission, onward)   Start     Dose/Rate Route Frequency Ordered Stop   04/18/18 1500  ceFAZolin (ANCEF) IVPB 1 g/50 mL premix     1 g 100 mL/hr over 30 Minutes Intravenous Every 6 hours 04/18/18 1352 04/19/18 0330   04/17/18 1200  ceFAZolin (ANCEF) IVPB 2g/100 mL premix     2 g 200 mL/hr over 30 Minutes Intravenous To Surgery 04/17/18 1152 04/18/18 0905    .  POD/HD#: 2  77 y/o white male s/p fall with L subtrochanteric femur fracture around hip fusion hardware. Approximately 3 months post op for L proximal humerus fracture and L distal radius fracture    - fall out of bed on 04/15/2018   -L subtrochanteric femur fracture, retained hip fusion hardware (place in 1950's) and broken hardware s/p ORIF L femur  TDWB L leg             ROM L knee and ankle as tolerated             PT/OT             TED hose              Ice PRN              Dressing changes as needed   Ok to shower   Can remove dressing and clean wound with soap and water only                           SW eval for SNF    - Pain management:             Titrate as needed              Current regimen appears effective    - ABL anemia/Hemodynamics             cbc in am  Asymptomatic  Not surprised by downward trend as it was an OPEN  repair of a FEMUR fracture       - Medical issues              Per primary    - DVT/PE prophylaxis:            Lovenox x 21 days    - ID:              periop abx to be completed   - Metabolic Bone Disease:             vitamin d looks good              Suspect L femur bone quality is suboptimal as he doesn't bear full weight through it    Results for CHANCEY, RINGEL (MRN [id number]) as of 04/20/2018 09:56  Ref. Range 04/18/2018 02:56  Vitamin D, 25-Hydroxy Latest Ref Range: 30.0 - 100.0 ng/mL 37.7     - Activity:             TDWB L leg    - FEN/GI prophylaxis/Foley/Lines:             CHO mod diet     - Dispo:             PT/OT evals            SNF vs CIR   Jari Pigg, PA-C [facility] [phone] Levi Aland (C) 04/20/2018, 9:51 AM

## 2018-04-20 NOTE — Clinical Social Work Note (Signed)
Clinical Social Work Assessment  Patient Details  Name: Casey Gilmore MRN: [id number] Date of Birth: 10-Nov-1941  Date of referral:  04/20/18               Reason for consult:  Facility Placement                Permission sought to share information with:  Facility Sport and exercise psychologist, Family Supports Permission granted to share information::  Yes, Verbal Permission Granted  Name::     Casey Gilmore::  SNF  Relationship::  Son  Contact Information:     Housing/Transportation Living arrangements for the past 2 months:  [place] of Information:  Patient, Adult Children Patient Interpreter Needed:  None Criminal Activity/Legal Involvement Pertinent to Current Situation/Hospitalization:  No - Comment as needed Significant Relationships:  Adult Children Lives with:  Self Do you feel safe going back to the place where you live?  Yes Need for family participation in patient care:  No (Coment)  Care giving concerns:  Patient lives home alone and will need short term rehab at discharge.   Social Worker assessment / plan:  CSW spoke with patient to discuss discharge plan. CSW provided bed offers to patient. CSW contacted later by patient's son to discuss bed offers, with preference for [facility]. CSW confirmed bed availability at [facility] for patient. CSW discussed with patient and son that the hospital would keep him if appropriate, but he would need SNF if not.  Employment status:  Retired Nurse, adult PT Recommendations:  [place] / Referral to community resources:  [place]  Patient/Family's Response to care:  Patient and son are hopeful that the patient can admit to CIR, but are also agreeable to SNF.  Patient/Family's Understanding of and Emotional Response to Diagnosis, Current Treatment, and Prognosis:  Patient discussed how he thought that the doctors were talking about keeping him at  the hospital for rehab, and not sending him out. Patient understood that SNF was the plan if he couldn't admit to CIR. Patient agreeable and said he'd talk the options over with his son. Patient's son discussed how [facility] would be the closest and most convenient for the family to visit, with [facility] in [place] being second choice.  Emotional Assessment Appearance:  Appears stated age Attitude/Demeanor/Rapport:  Engaged Affect (typically observed):  Pleasant Orientation:  Oriented to Self, Oriented to Place, Oriented to  Time, Oriented to Situation Alcohol / Substance use:  Not Applicable Psych involvement (Current and /or in the community):  No (Comment)  Discharge Needs  Concerns to be addressed:  Care Coordination Readmission within the last 30 days:  No Current discharge risk:  Dependent with Mobility, Lives alone Barriers to Discharge:  Continued Medical Work up, Cobb, Normanna 04/20/2018, 4:29 PM

## 2018-04-20 NOTE — Consult Note (Signed)
Physical Medicine and Rehabilitation Consult   Reason for Consult: Functional decline due to subtrochanteric fracture Referring Physician: Dr. Marcelino Scot   HPI: Casey Gilmore is a 77 y.o. 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 He was found to have complex left subtrochanteric fracture. Patient with chronic SOB and cardiology consulted for surgical clearance. 2 D echo done revealing EF 60-65% with mild LVH and normal function of bioprosthetic AV.  He underwent removal of left femur/hip fusion hardware with ORIF left proximal femur fx by Dr. Marcelino Scot on 04/18/18. Post op to be TDWB on LLL with unrestricted movement of knee and ankle. Patient was ambulating on left toes at baseline without AD and CIR consulted for intensive rehab program.    Review of Systems  Constitutional: Negative for chills and fever.  HENT: Positive for hearing loss (right ear).   Eyes: Negative for blurred vision and double vision.  Respiratory: Positive for shortness of breath (with activity). Negative for cough.   Cardiovascular: Negative for chest pain and palpitations.  Gastrointestinal: Positive for heartburn. Negative for abdominal pain.  Genitourinary: Negative for dysuria and urgency.  Musculoskeletal: Positive for joint pain. Negative for myalgias and neck pain.  Neurological: Positive for weakness. Negative for dizziness and headaches.  Psychiatric/Behavioral: Positive for depression. The patient is not nervous/anxious.      Past Medical History:  Diagnosis Date  . Anemia    2012  . Arthritis   . Blood transfusion   . 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 Closed fracture of left distal radius and ulna 01/19/2018  . Closed fracture of left proximal humerus 01/19/2018  . COPD (chronic obstructive pulmonary disease) ([place])   . Diabetes mellitus    . GERD (gastroesophageal reflux disease)   . Heart murmur   . Hiatal hernia   . Hyperlipidemia   . Hypertension   . NSTEMI (non-ST elevated myocardial infarction) ([place]) 07/2011   in setting of Pneumonia with normal coronary arteries on cath  . Pneumonia hx 8/12  . Severe aortic stenosis 2012   s/p bioprosthetic AVR  . Shortness of breath    with exertion  . Upper GI bleeding     Past Surgical History:  Procedure Laterality Date  . AORTIC VALVE REPLACEMENT  10/15/2011   Procedure: AORTIC VALVE REPLACEMENT (AVR);  Surgeon: Tharon Aquas Adelene Idler, MD;  Location: [place];  Service: Open Heart Surgery;  Laterality: N/A;  . CARDIAC CATHETERIZATION  results on chart   2012 w no significant disease,echo with Severe Aortic stenosis,LVH,Diastolic dysfunction.   . COLONOSCOPY  12/2004  . EYE SURGERY  bil cat 08  . FRACTURE SURGERY  pin in lft hip  . JOINT REPLACEMENT  left knee 08, rt hip 08  . LARYNGOSCOPY  01/05/2013   Procedure: LARYNGOSCOPY;  Surgeon: Melida Quitter, MD;  Location: [place];  Service: ENT;  Laterality: N/A;  micro direct laryngoscopy with biopsy, possible co2 laser, small laser-safe endotracheal tube  . LARYNGOSCOPY N/A 02/08/2013   Procedure: LARYNGOSCOPY;  Surgeon: Melida Quitter, MD;  Location: [place];  Service: ENT;  Laterality: N/A;  Micro Direct Laryngoscopy with Co2 laser with re-excision right vocal cord lesion.  . OPEN REDUCTION INTERNAL FIXATION (ORIF) DISTAL RADIAL FRACTURE Left 01/19/2018   Procedure: OPEN REDUCTION INTERNAL FIXATION (ORIF) DISTAL RADIAL FRACTURE;  Surgeon: Marchia Bond, MD;  Location: [place];  Service: Orthopedics;  Laterality: Left;  . ORIF FEMUR FRACTURE Left 04/18/2018   Procedure: OPEN REDUCTION INTERNAL FIXATION PROXIMAL FEMORAL SHAFT FRACTURE...REMOVAL OF HARDWARE LEFT FEMUR (HIP FUSION PLATE);  Surgeon: Altamese John Day, MD;  Location: [place];  Service: Orthopedics;  Laterality: Left;  . ORIF HUMERUS FRACTURE Left 01/19/2018   Procedure: OPEN REDUCTION  INTERNAL FIXATION (ORIF) PROXIMAL HUMERUS FRACTURE;  Surgeon: Marchia Bond, MD;  Location: [place];  Service: Orthopedics;  Laterality: Left;  . TONSILLECTOMY    . TOTAL HIP ARTHROPLASTY Right   . TOTAL KNEE ARTHROPLASTY Left   . 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 No pertinent family history.    Social History: Lives alone. Son and daughter- in- law live next door and can check in after discharge. Retired 4 years ago and was independent without AD. He reports that he quit smoking about 39 years ago. His smoking use included cigarettes. He has a 50.00 pack-year smoking history. He has never used smokeless tobacco. He reports that he does not drink alcohol or use drugs.    Allergies: No Known Allergies    Medications Prior to Admission  Medication Sig Dispense Refill  . cyanocobalamin 2000 MCG tablet Take 4,000 mcg by mouth daily.     . furosemide (LASIX) 40 MG tablet Take 40 mg by mouth daily.     Marland Kitchen glimepiride (AMARYL) 4 MG tablet Take 4 mg by mouth daily before breakfast.     . HYDROcodone-acetaminophen (NORCO/VICODIN) 5-325 MG tablet TK 1 T PO TID PRF PAIN  0  . metFORMIN (GLUCOPHAGE) 1000 MG tablet Take 1,000 mg by mouth 2 (two) times daily with a meal.    . Multiple Vitamins-Minerals (MULTIVITAMIN ADULTS) TABS Take 1 tablet by mouth daily.    Marland Kitchen omeprazole (PRILOSEC) 40 MG capsule Take 40 mg by mouth daily.    . ondansetron (ZOFRAN) 4 MG tablet Take 1 tablet (4 mg total) by mouth every 8 (eight) hours as needed for nausea or vomiting. 10 tablet 0  . pravastatin (PRAVACHOL) 20 MG tablet Take 20 mg by mouth daily.  5  . PRESCRIPTION MEDICATION Inject 40 Units into the skin daily. Insulin Doctor is giving him samples of    . Vitamin D, Ergocalciferol, (DRISDOL) 50000 units CAPS capsule TK 1 C PO 2 TIMES A WK FOR 6 MONTHS  6  . HYDROcodone-acetaminophen (NORCO) 10-325 MG tablet Take 1 tablet by mouth every  6 (six) hours as needed. (Patient not taking: Reported on 04/15/2018) 30 tablet 0    Home: Home Living Family/patient expects to be discharged to:: Private residence Living Arrangements: Alone Available Help at Discharge: Family, Available PRN/intermittently Type of Home: House Home Access: Stairs to enter Technical brewer of Steps: 4 Entrance Stairs-Rails: Left, Right Home Layout: One level Bathroom Shower/Tub: Multimedia programmer: Handicapped height Home Equipment: [place] - single point  Functional History: Prior Function Level of Independence: Independent with assistive device(s) Functional Status:  Mobility: Bed Mobility Overal bed mobility: Needs Assistance Bed Mobility: Supine to Sit, Sit to Supine Supine to sit: Max assist, +2 for physical assistance Sit to supine: Total assist, +2 for physical assistance General bed mobility comments: Pt able to initiate movement and attempt bridging to transition to EOB but requiring max assist +2 to complete sit<>supine. Difficulty achieving fully upright position secondary to pain and decreased L hip flexion.  Transfers General transfer  comment: unable Ambulation/Gait General Gait Details: unable    ADL: ADL Overall ADL's : Needs assistance/impaired Eating/Feeding: Minimal assistance, Bed level Grooming: Minimal assistance, Bed level Upper Body Bathing: Maximal assistance, Bed level Lower Body Bathing: Total assistance, Bed level Upper Body Dressing : Maximal assistance, Bed level Lower Body Dressing: Total assistance, Bed level General ADL Comments: Pt able to sit at EOB but unable to achieve midline positioning due to pain and limited L knee ROM.   Cognition: Cognition Overall Cognitive Status: No family/caregiver present to determine baseline cognitive functioning Orientation Level: Oriented X4 Cognition Arousal/Alertness: Awake/alert Behavior During Therapy: WFL for tasks assessed/performed Overall  Cognitive Status: No family/caregiver present to determine baseline cognitive functioning Area of Impairment: Memory, Following commands, Awareness, Problem solving Memory: Decreased short-term memory Following Commands: Follows multi-step commands inconsistently Awareness: Emergent Problem Solving: Slow processing, Difficulty sequencing General Comments: Pt with difficulty reporting full understanding of his current situation.   Blood pressure (!) 108/56, pulse 98, temperature 98.6 F (37 C), temperature source Oral, resp. rate 16, height 6' (1.829 m), weight 79.4 kg (175 lb), SpO2 100 %. Physical Exam  Nursing note and vitals reviewed. Constitutional:  Pleasant and appropriate  Musculoskeletal:  LLE>RLE limitation due to pain inhibition.   Neurological:  Mild dysarthria. Able to follow basic commands and answer orientation questions without difficulty.     Results for orders placed or performed during the hospital encounter of 04/15/18 (from the past 24 hour(s))  Glucose, capillary     Status: Abnormal   Collection Time: 04/19/18 11:58 AM  Result Value Ref Range   Glucose-Capillary 200 (H) 65 - 99 mg/dL  CBC     Status: Abnormal   Collection Time: 04/19/18  2:15 PM  Result Value Ref Range   WBC 7.0 4.0 - 10.5 K/uL   RBC 3.52 (L) 4.22 - 5.81 MIL/uL   Hemoglobin 8.7 (L) 13.0 - 17.0 g/dL   HCT 28.4 (L) 39.0 - 52.0 %   MCV 80.7 78.0 - 100.0 fL   MCH 24.7 (L) 26.0 - 34.0 pg   MCHC 30.6 30.0 - 36.0 g/dL   RDW 18.5 (H) 11.5 - 15.5 %   Platelets 217 150 - 400 K/uL  Glucose, capillary     Status: Abnormal   Collection Time: 04/19/18  4:09 PM  Result Value Ref Range   Glucose-Capillary 167 (H) 65 - 99 mg/dL   Comment 1 Notify RN    Comment 2 Document in Chart   Glucose, capillary     Status: Abnormal   Collection Time: 04/19/18  9:55 PM  Result Value Ref Range   Glucose-Capillary 185 (H) 65 - 99 mg/dL  Basic metabolic panel     Status: Abnormal   Collection Time: 04/20/18   3:39 AM  Result Value Ref Range   Sodium 135 135 - 145 mmol/L   Potassium 4.2 3.5 - 5.1 mmol/L   Chloride 99 (L) 101 - 111 mmol/L   CO2 28 22 - 32 mmol/L   Glucose, Bld 165 (H) 65 - 99 mg/dL   BUN 15 6 - 20 mg/dL   Creatinine, Ser 1.15 0.61 - 1.24 mg/dL   Calcium 8.3 (L) 8.9 - 10.3 mg/dL   GFR calc non Af Amer 60 (L) >60 mL/min   GFR calc Af Amer >60 >60 mL/min   Anion gap 8 5 - 15  CBC     Status: Abnormal   Collection Time: 04/20/18  3:39 AM  Result Value Ref Range   WBC  7.4 4.0 - 10.5 K/uL   RBC 3.43 (L) 4.22 - 5.81 MIL/uL   Hemoglobin 8.2 (L) 13.0 - 17.0 g/dL   HCT 27.4 (L) 39.0 - 52.0 %   MCV 79.9 78.0 - 100.0 fL   MCH 23.9 (L) 26.0 - 34.0 pg   MCHC 29.9 (L) 30.0 - 36.0 g/dL   RDW 19.0 (H) 11.5 - 15.5 %   Platelets 232 150 - 400 K/uL  Glucose, capillary     Status: Abnormal   Collection Time: 04/20/18  6:08 AM  Result Value Ref Range   Glucose-Capillary 162 (H) 65 - 99 mg/dL   Dg C-arm 1-60 Min  Result Date: 04/18/2018 CLINICAL DATA:  Open reduction and internal fixation of left femur. EXAM: DG C-ARM 61-120 MIN; LEFT FEMUR 2 VIEWS FLUOROSCOPY TIME:  1 minutes 40 seconds. COMPARISON:  Radiographs of Apr 15, 2018. FINDINGS: Nine intraoperative fluoroscopic images were obtained of the left hip. These images demonstrate surgical internal fixation of proximal left femoral fracture. Improved alignment of fracture components is noted. IMPRESSION: Status post surgical internal fixation of proximal left femoral shaft fracture. Electronically Signed   By: Marijo Conception, M.D.   On: 04/18/2018 13:16   Dg C-arm 1-60 Min  Result Date: 04/18/2018 CLINICAL DATA:  Open reduction and internal fixation of left femur. EXAM: DG C-ARM 61-120 MIN; LEFT FEMUR 2 VIEWS FLUOROSCOPY TIME:  1 minutes 40 seconds. COMPARISON:  Radiographs of Apr 15, 2018. FINDINGS: Nine intraoperative fluoroscopic images were obtained of the left hip. These images demonstrate surgical internal fixation of proximal left  femoral fracture. Improved alignment of fracture components is noted. IMPRESSION: Status post surgical internal fixation of proximal left femoral shaft fracture. Electronically Signed   By: Marijo Conception, M.D.   On: 04/18/2018 13:16   Dg C-arm 1-60 Min  Result Date: 04/18/2018 CLINICAL DATA:  Open reduction and internal fixation of left femur. EXAM: DG C-ARM 61-120 MIN; LEFT FEMUR 2 VIEWS FLUOROSCOPY TIME:  1 minutes 40 seconds. COMPARISON:  Radiographs of Apr 15, 2018. FINDINGS: Nine intraoperative fluoroscopic images were obtained of the left hip. These images demonstrate surgical internal fixation of proximal left femoral fracture. Improved alignment of fracture components is noted. IMPRESSION: Status post surgical internal fixation of proximal left femoral shaft fracture. Electronically Signed   By: Marijo Conception, M.D.   On: 04/18/2018 13:16   Dg Hip Unilat With Pelvis 2-3 Views Left  Result Date: 04/18/2018 CLINICAL DATA:  Hip surgery. EXAM: DG HIP (WITH OR WITHOUT PELVIS) 2-3V LEFT COMPARISON:  04/15/2018. FINDINGS: ORIF left hip/proximal femur. Near anatomic alignment. Some degree of callus formation is noted. Fracture lucency remains. Prior total right hip replacement. IMPRESSION: 1. ORIF left hip/proximal femur. Near anatomic alignment. Some degree of callus formation is noted. 2.  Prior total right hip replacement. Electronically Signed   By: Marcello Moores  Register   On: 04/18/2018 15:52   Dg Femur Min 2 Views Left  Result Date: 04/18/2018 CLINICAL DATA:  Open reduction and internal fixation of left femur. EXAM: DG C-ARM 61-120 MIN; LEFT FEMUR 2 VIEWS FLUOROSCOPY TIME:  1 minutes 40 seconds. COMPARISON:  Radiographs of Apr 15, 2018. FINDINGS: Nine intraoperative fluoroscopic images were obtained of the left hip. These images demonstrate surgical internal fixation of proximal left femoral fracture. Improved alignment of fracture components is noted. IMPRESSION: Status post surgical internal  fixation of proximal left femoral shaft fracture. Electronically Signed   By: Marijo Conception, M.D.   On: 04/18/2018 13:16  Assessment/Plan: Diagnosis: left subtrochanteric hip fx s/p hardware removal/ORIF 1. Does the need for close, 24 hr/day medical supervision in concert with the patient's rehab needs make it unreasonable for this patient to be served in a less intensive setting? Yes 2. Co-Morbidities requiring supervision/potential complications: COPD, cdCHF, DM, acute blood loss anemia, pain mgt 3. Due to bladder management, bowel management, safety, skin/wound care, disease management, medication administration, pain management and patient education, does the patient require 24 hr/day rehab nursing? Yes 4. Does the patient require coordinated care of a physician, rehab nurse, PT (1-2 hrs/day, 5 days/week) and OT (1-2 hrs/day, 5 days/week) to address physical and functional deficits in the context of the above medical diagnosis(es)? Yes Addressing deficits in the following areas: balance, endurance, locomotion, strength, transferring, bowel/bladder control, bathing, dressing, feeding, grooming, toileting and psychosocial support 5. Can the patient actively participate in an intensive therapy program of at least 3 hrs of therapy per day at least 5 days per week? Yes 6. The potential for patient to make measurable gains while on inpatient rehab is good 7. Anticipated functional outcomes upon discharge from inpatient rehab are supervision  with PT, supervision and min assist with OT, n/a with SLP. 8. Estimated rehab length of stay to reach the above functional goals is: 12-16 days 9. Anticipated D/C setting: Home 10. Anticipated post D/C treatments: [place] therapy 11. Overall Rehab/Functional Prognosis: good  RECOMMENDATIONS: This patient's condition is appropriate for continued rehabilitative care in the following setting: CIR Patient has agreed to participate in recommended program. Yes Note  that insurance prior authorization may be required for reimbursement for recommended care.  Comment: Rehab Admissions Coordinator to follow up.  Thanks,  Meredith Staggers, MD, Mellody Drown      Bary Leriche, PA-C 04/20/2018

## 2018-04-20 NOTE — Progress Notes (Signed)
PROGRESS NOTE  Casey Gilmore HER:[id number] DOB: 12/17/1940 DOA: 04/15/2018 PCP: Mayra Neer, MD   LOS: 5 days   Brief Narrative / Interim history: 77 year old male with complex orthopedic history presents to the hospital after falling out of bed and developing severe leg pain.  He was admitted for hip fracture.  He was cleared by cardiology for surgery and underwent operative repair on 5/14.  Assessment & Plan: Principal Problem:   Closed left hip fracture (HCC) Active Problems:   Diabetes mellitus (HCC)   Chronic diastolic heart failure (HCC)   Aortic valve replaced   COPD (chronic obstructive pulmonary disease) with chronic bronchitis (HCC)   Anemia   Left hip fracture -Orthopedic surgery consulted and followed patient, he is status post operative repair on 5/14 by Dr. Marcelino Scot -He is doing relatively well postop, seems to have significant pain when worked with physical therapy last night. -PT recommends SNF, patient is coming more to terms today that he will indeed need SNF, discussed with social worker -Dressing change by orthopedic today  Acute blood loss anemia   Chronic diastolic CHF -Stable, he appears euvolemic, continue Lasix, stop fluids post op  COPD -This appears stable, continue to monitor  Diabetes mellitus, uncontrolled -Continue sliding scale insulin, most recent A1c was 7.6, CBGs in the 160s this morning  Iron and B12 deficiency anemia /component of acute blood loss anemia postoperatively -Hemoglobin slightly downtrending 9.92 days ago, 8.2 this morning, will continue to monitor, no evidence of bleeding  History of AS, post bioprosthesis AVR 2012 -Echo reassuring   DVT prophylaxis: Lovenox Code Status: Full code Family Communication: no family at bedside Disposition Plan: SNF tomorrow  Consultants:   Orthopedic surgery   Procedures:   2D echo:  Study Conclusions - Left ventricle: The cavity size was normal. Wall thickness was increased  in a pattern of mild LVH. Systolic function was normal. The estimated ejection fraction was in the range of 60% to 65%. - Aortic valve: Bioprosthetic valve with acceptable gradients DVI .5 suggesting normal function and no significant peri valvular regurgiation. Valve area (VTI): 0.88 cm^2. Valve area (Vmax): 0.77 cm^2. Valve area (Vmean): 0.73 cm^2. - Mitral valve: Valve area by continuity equation (using LVOT flow): 0.83 cm^2. - Atrial septum: No defect or patent foramen ovale was identified.   ORIF L femur 5/14  Antimicrobials:  None    Subjective: -Tells me he feels weak, still has pain when he tries to stop, and now is agreeable to SNF.  No chest pain, no shortness of breath  Objective: Vitals:   04/19/18 1400 04/19/18 2009 04/20/18 0323 04/20/18 0804  BP: (!) 109/55 108/77 (!) 104/57 (!) 108/56  Pulse: 88 93 86 98  Resp: 16     Temp: 97.9 F (36.6 C) 98.7 F (37.1 C) 98.6 F (37 C)   TempSrc: Oral Oral Oral   SpO2: 100% 90% 100%   Weight:      Height:        Intake/Output Summary (Last 24 hours) at 04/20/2018 8563 Last data filed at 04/20/2018 0507 Gross per 24 hour  Intake 480 ml  Output 3050 ml  Net -2570 ml   Filed Weights   04/15/18 1032  Weight: 79.4 kg (175 lb)    Examination:  Constitutional: No distress, pleasant, eating breakfast Eyes: No scleral icterus Respiratory: Clear to auscultation bilaterally without wheezing or crackles, normal respiratory effort Cardiovascular: Regular rate and rhythm without murmurs.  No peripheral edema Abdomen: Soft, nontender, nondistended, bowel sounds positive  Skin: No rashes seen Neurologic: Nonfocal, equal strength   Data Reviewed: I have independently reviewed following labs and imaging studies   CBC: Recent Labs  Lab 04/15/18 1050  04/17/18 1239 04/18/18 1803 04/19/18 0309 04/19/18 1415 04/20/18 0339  WBC 10.4   < > 8.7 7.9 7.2 7.0 7.4  NEUTROABS 7.7  --   --   --   --   --   --   HGB 8.8*   < > 8.5*  9.9* 8.3* 8.7* 8.2*  HCT 29.5*   < > 27.9* 32.1* 27.9* 28.4* 27.4*  MCV 76.8*   < > 76.4* 77.3* 79.5 80.7 79.9  PLT 290   < > 277 247 241 217 232   < > = values in this interval not displayed.   Basic Metabolic Panel: Recent Labs  Lab 04/15/18 1050 04/16/18 1340 04/17/18 0347 04/18/18 1803 04/19/18 0309 04/20/18 0339  NA 141 136 138  --  136 135  K 4.2 4.7 4.3  --  4.6 4.2  CL 106 103 105  --  103 99*  CO2 23 24 26   --  27 28  GLUCOSE 80 233* 189*  --  164* 165*  BUN 16 18 17   --  15 15  CREATININE 0.99 1.13 1.09 1.12 1.14 1.15  CALCIUM 9.0 9.1 8.8*  --  8.3* 8.3*  MG  --   --  1.5*  --   --   --    GFR: Estimated Creatinine Clearance: 59 mL/min (by C-G formula based on SCr of 1.15 mg/dL). Liver Function Tests: Recent Labs  Lab 04/17/18 0347  AST 13*  ALT 9*  ALKPHOS 81  BILITOT 0.4  PROT 6.1*  ALBUMIN 3.0*   No results for input(s): LIPASE, AMYLASE in the last 168 hours. No results for input(s): AMMONIA in the last 168 hours. Coagulation Profile: Recent Labs  Lab 04/15/18 1050  INR 1.02   Cardiac Enzymes: No results for input(s): CKTOTAL, CKMB, CKMBINDEX, TROPONINI in the last 168 hours. BNP (last 3 results) No results for input(s): PROBNP in the last 8760 hours. HbA1C: Recent Labs    04/18/18 0256  HGBA1C 7.6*   CBG: Recent Labs  Lab 04/19/18 0627 04/19/18 1158 04/19/18 1609 04/19/18 2155 04/20/18 0608  GLUCAP 154* 200* 167* 185* 162*   Lipid Profile: No results for input(s): CHOL, HDL, LDLCALC, TRIG, CHOLHDL, LDLDIRECT in the last 72 hours. Thyroid Function Tests: No results for input(s): TSH, T4TOTAL, FREET4, T3FREE, THYROIDAB in the last 72 hours. Anemia Panel: No results for input(s): VITAMINB12, FOLATE, FERRITIN, TIBC, IRON, RETICCTPCT in the last 72 hours. Urine analysis:    Component Value Date/Time   COLORURINE YELLOW 10/12/2011 [place] 10/12/2011 1353   LABSPEC 1.026 10/12/2011 1353   PHURINE 5.5 10/12/2011  1353   GLUCOSEU NEGATIVE 10/12/2011 1353   HGBUR NEGATIVE 10/12/2011 1353   BILIRUBINUR NEGATIVE 10/12/2011 1353   KETONESUR NEGATIVE 10/12/2011 1353   PROTEINUR NEGATIVE 10/12/2011 1353   UROBILINOGEN 1.0 10/12/2011 1353   NITRITE NEGATIVE 10/12/2011 1353   LEUKOCYTESUR NEGATIVE 10/12/2011 1353   Sepsis Labs: Invalid input(s): PROCALCITONIN, LACTICIDVEN  Recent Results (from the past 240 hour(s))  Surgical pcr screen     Status: None   Collection Time: 04/16/18 11:06 PM  Result Value Ref Range Status   MRSA, PCR NEGATIVE NEGATIVE Final   Staphylococcus aureus NEGATIVE NEGATIVE Final    Comment: (NOTE) The Xpert SA Assay (FDA approved for NASAL specimens in patients 22 years  of age and older), is one component of a comprehensive surveillance program. It is not intended to diagnose infection nor to guide or monitor treatment. Performed at [facility], [place] [address] [place], [place] [address]       Radiology Studies: Dg C-arm 1-60 Min  Result Date: 04/18/2018 CLINICAL DATA:  Open reduction and internal fixation of left femur. EXAM: DG C-ARM 61-120 MIN; LEFT FEMUR 2 VIEWS FLUOROSCOPY TIME:  1 minutes 40 seconds. COMPARISON:  Radiographs of Apr 15, 2018. FINDINGS: Nine intraoperative fluoroscopic images were obtained of the left hip. These images demonstrate surgical internal fixation of proximal left femoral fracture. Improved alignment of fracture components is noted. IMPRESSION: Status post surgical internal fixation of proximal left femoral shaft fracture. Electronically Signed   By: Marijo Conception, M.D.   On: 04/18/2018 13:16   Dg C-arm 1-60 Min  Result Date: 04/18/2018 CLINICAL DATA:  Open reduction and internal fixation of left femur. EXAM: DG C-ARM 61-120 MIN; LEFT FEMUR 2 VIEWS FLUOROSCOPY TIME:  1 minutes 40 seconds. COMPARISON:  Radiographs of Apr 15, 2018. FINDINGS: Nine intraoperative fluoroscopic images were obtained of the left hip. These images demonstrate  surgical internal fixation of proximal left femoral fracture. Improved alignment of fracture components is noted. IMPRESSION: Status post surgical internal fixation of proximal left femoral shaft fracture. Electronically Signed   By: Marijo Conception, M.D.   On: 04/18/2018 13:16   Dg C-arm 1-60 Min  Result Date: 04/18/2018 CLINICAL DATA:  Open reduction and internal fixation of left femur. EXAM: DG C-ARM 61-120 MIN; LEFT FEMUR 2 VIEWS FLUOROSCOPY TIME:  1 minutes 40 seconds. COMPARISON:  Radiographs of Apr 15, 2018. FINDINGS: Nine intraoperative fluoroscopic images were obtained of the left hip. These images demonstrate surgical internal fixation of proximal left femoral fracture. Improved alignment of fracture components is noted. IMPRESSION: Status post surgical internal fixation of proximal left femoral shaft fracture. Electronically Signed   By: Marijo Conception, M.D.   On: 04/18/2018 13:16   Dg Hip Unilat With Pelvis 2-3 Views Left  Result Date: 04/18/2018 CLINICAL DATA:  Hip surgery. EXAM: DG HIP (WITH OR WITHOUT PELVIS) 2-3V LEFT COMPARISON:  04/15/2018. FINDINGS: ORIF left hip/proximal femur. Near anatomic alignment. Some degree of callus formation is noted. Fracture lucency remains. Prior total right hip replacement. IMPRESSION: 1. ORIF left hip/proximal femur. Near anatomic alignment. Some degree of callus formation is noted. 2.  Prior total right hip replacement. Electronically Signed   By: Marcello Moores  Register   On: 04/18/2018 15:52   Dg Femur Min 2 Views Left  Result Date: 04/18/2018 CLINICAL DATA:  Open reduction and internal fixation of left femur. EXAM: DG C-ARM 61-120 MIN; LEFT FEMUR 2 VIEWS FLUOROSCOPY TIME:  1 minutes 40 seconds. COMPARISON:  Radiographs of Apr 15, 2018. FINDINGS: Nine intraoperative fluoroscopic images were obtained of the left hip. These images demonstrate surgical internal fixation of proximal left femoral fracture. Improved alignment of fracture components is noted.  IMPRESSION: Status post surgical internal fixation of proximal left femoral shaft fracture. Electronically Signed   By: Marijo Conception, M.D.   On: 04/18/2018 13:16     Scheduled Meds: . acetaminophen  650 mg Oral Q6H  . acetaminophen  650 mg Oral Once  . docusate sodium  100 mg Oral BID  . enoxaparin (LOVENOX) injection  30 mg Subcutaneous Q24H  . ferrous sulfate  325 mg Oral BID WC  . furosemide  40 mg Oral Daily  . insulin aspart  0-5 Units Subcutaneous QHS  . insulin aspart  0-9 Units Subcutaneous TID WC  . pantoprazole  40 mg Oral Daily  . pravastatin  20 mg Oral Daily  . cyanocobalamin  4,000 mcg Oral Daily  . Vitamin D (Ergocalciferol)  50,000 Units Oral Q7 days   Continuous Infusions: . sodium chloride    . 0.9 % NaCl with KCl 20 mEq / L 100 mL/hr at 04/20/18 7564     Marzetta Board, MD, PhD [facility] Pager [phone]  If 7PM-7AM, please contact night-coverage [email or web address] Password [facility] 04/20/2018, 9:52 AM

## 2018-04-20 NOTE — Anesthesia Postprocedure Evaluation (Signed)
Anesthesia Post Note  Patient: Casey Gilmore  Procedure(s) Performed: OPEN REDUCTION INTERNAL FIXATION PROXIMAL FEMORAL SHAFT FRACTURE...REMOVAL OF HARDWARE LEFT FEMUR (HIP FUSION PLATE) (Left )     Patient location during evaluation: PACU Anesthesia Type: General Level of consciousness: awake and alert Pain management: pain level controlled Vital Signs Assessment: post-procedure vital signs reviewed and stable Respiratory status: spontaneous breathing, nonlabored ventilation, respiratory function stable and patient connected to nasal cannula oxygen Cardiovascular status: blood pressure returned to baseline and stable Postop Assessment: no apparent nausea or vomiting Anesthetic complications: no    Last Vitals:  Vitals:   04/19/18 2009 04/20/18 0323  BP: 108/77 (!) 104/57  Pulse: 93 86  Resp:    Temp: 37.1 C 37 C  SpO2: 90% 100%    Last Pain:  Vitals:   04/20/18 0323  TempSrc: Oral  PainSc:                  Mouhamed Glassco

## 2018-04-21 LAB — BASIC METABOLIC PANEL
ANION GAP: 9 (ref 5–15)
BUN: 15 mg/dL (ref 6–20)
CALCIUM: 8.9 mg/dL (ref 8.9–10.3)
CO2: 29 mmol/L (ref 22–32)
CREATININE: 1.18 mg/dL (ref 0.61–1.24)
Chloride: 99 mmol/L — ABNORMAL LOW (ref 101–111)
GFR, EST NON AFRICAN AMERICAN: 58 mL/min — AB (ref >60)
Glucose, Bld: 181 mg/dL — ABNORMAL HIGH (ref 65–99)
Potassium: 4.2 mmol/L (ref 3.5–5.1)
SODIUM: 137 mmol/L (ref 135–145)

## 2018-04-21 LAB — GLUCOSE, CAPILLARY
GLUCOSE-CAPILLARY: 181 mg/dL — AB (ref 65–99)
GLUCOSE-CAPILLARY: 188 mg/dL — AB (ref 65–99)
Glucose-Capillary: 263 mg/dL — ABNORMAL HIGH (ref 65–99)
Glucose-Capillary: 183 mg/dL — ABNORMAL HIGH (ref 65–99)

## 2018-04-21 LAB — CBC
HCT: 29.5 % — ABNORMAL LOW (ref 39.0–52.0)
HEMOGLOBIN: 8.9 g/dL — AB (ref 13.0–17.0)
MCH: 24.3 pg — ABNORMAL LOW (ref 26.0–34.0)
MCHC: 30.2 g/dL (ref 30.0–36.0)
MCV: 80.4 fL (ref 78.0–100.0)
PLATELETS: 241 10*3/uL (ref 150–400)
RBC: 3.67 MIL/uL — AB (ref 4.22–5.81)
RDW: 19 % — ABNORMAL HIGH (ref 11.5–15.5)
WBC: 7.4 10*3/uL (ref 4.0–10.5)

## 2018-04-21 NOTE — Discharge Instructions (Signed)
Orthopaedic Trauma Service Discharge Instructions   General Discharge Instructions  WEIGHT BEARING STATUS: Touchdown weightbearing Left leg   RANGE OF MOTION/ACTIVITY: left knee and ankle ROM as tolerated. Hip range of motion restricted due to pre-existing hip fusion   Wound Care: daily wound care as needed effective now.  See below Discharge Wound Care Instructions  Do NOT apply any ointments, solutions or lotions to pin sites or surgical wounds.  These prevent needed drainage and even though solutions like hydrogen peroxide kill bacteria, they also damage cells lining the pin sites that help fight infection.  Applying lotions or ointments can keep the wounds moist and can cause them to breakdown and open up as well. This can increase the risk for infection. When in doubt call the office.  Surgical incisions should be dressed daily.  If any drainage is noted, use one layer of adaptic, then gauze, Kerlix, and an ace wrap.  Once the incision is completely dry and without drainage, it may be left open to air out.  Showering may begin 36-48 hours later.  Cleaning gently with soap and water.  Traumatic wounds should be dressed daily as well.    One layer of adaptic, gauze, Kerlix, then ace wrap.  The adaptic can be discontinued once the draining has ceased    If you have a wet to dry dressing: wet the gauze with saline the squeeze as much saline out so the gauze is moist (not soaking wet), place moistened gauze over wound, then place a dry gauze over the moist one, followed by Kerlix wrap, then ace wrap.  DVT/PE prophylaxis: Lovenox 40 mg subcutaneous injection daily x 21 days   Diet: as you were eating previously.  Can use over the counter stool softeners and bowel preparations, such as Miralax, to help with bowel movements.  Narcotics can be constipating.  Be sure to drink plenty of fluids  PAIN MEDICATION USE AND EXPECTATIONS  You have likely been given narcotic medications to help  control your pain.  After a traumatic event that results in an fracture (broken bone) with or without surgery, it is ok to use narcotic pain medications to help control one's pain.  We understand that everyone responds to pain differently and each individual patient will be evaluated on a regular basis for the continued need for narcotic medications. Ideally, narcotic medication use should last no more than 6-8 weeks (coinciding with fracture healing).   As a patient it is your responsibility as well to monitor narcotic medication use and report the amount and frequency you use these medications when you come to your office visit.   We would also advise that if you are using narcotic medications, you should take a dose prior to therapy to maximize you participation.  IF YOU ARE ON NARCOTIC MEDICATIONS IT IS NOT PERMISSIBLE TO OPERATE A MOTOR VEHICLE (MOTORCYCLE/CAR/TRUCK/MOPED) OR HEAVY MACHINERY DO NOT MIX NARCOTICS WITH OTHER CNS (CENTRAL NERVOUS SYSTEM) DEPRESSANTS SUCH AS ALCOHOL   STOP SMOKING OR USING NICOTINE PRODUCTS!!!!  As discussed nicotine severely impairs your body's ability to heal surgical and traumatic wounds but also impairs bone healing.  Wounds and bone heal by forming microscopic blood vessels (angiogenesis) and nicotine is a vasoconstrictor (essentially, shrinks blood vessels).  Therefore, if vasoconstriction occurs to these microscopic blood vessels they essentially disappear and are unable to deliver necessary nutrients to the healing tissue.  This is one modifiable factor that you can do to dramatically increase your chances of healing your injury.    (  This means no smoking, no nicotine gum, patches, etc)  DO NOT USE NONSTEROIDAL ANTI-INFLAMMATORY DRUGS (NSAID'S)  Using products such as Advil (ibuprofen), Aleve (naproxen), Motrin (ibuprofen) for additional pain control during fracture healing can delay and/or prevent the healing response.  If you would like to take over the  counter (OTC) medication, Tylenol (acetaminophen) is ok.  However, some narcotic medications that are given for pain control contain acetaminophen as well. Therefore, you should not exceed more than 4000 mg of tylenol in a day if you do not have liver disease.  Also note that there are may OTC medicines, such as cold medicines and allergy medicines that my contain tylenol as well.  If you have any questions about medications and/or interactions please ask your doctor/PA or your pharmacist.      ICE AND ELEVATE INJURED/OPERATIVE EXTREMITY  Using ice and elevating the injured extremity above your heart can help with swelling and pain control.  Icing in a pulsatile fashion, such as 20 minutes on and 20 minutes off, can be followed.    Do not place ice directly on skin. Make sure there is a barrier between to skin and the ice pack.    Using frozen items such as frozen peas works well as the conform nicely to the are that needs to be iced.  USE AN ACE WRAP OR TED HOSE FOR SWELLING CONTROL  In addition to icing and elevation, Ace wraps or TED hose are used to help limit and resolve swelling.  It is recommended to use Ace wraps or TED hose until you are informed to stop.    When using Ace Wraps start the wrapping distally (farthest away from the body) and wrap proximally (closer to the body)   Example: If you had surgery on your leg or thing and you do not have a splint on, start the ace wrap at the toes and work your way up to the thigh        If you had surgery on your upper extremity and do not have a splint on, start the ace wrap at your fingers and work your way up to the upper arm  IF YOU ARE IN A SPLINT OR CAST DO NOT [place]   If your splint gets wet for any reason please contact the office immediately. You may shower in your splint or cast as long as you keep it dry.  This can be done by wrapping in a cast cover or garbage back (or similar)  Do Not stick any thing down your splint  or cast such as pencils, money, or hangers to try and scratch yourself with.  If you feel itchy take benadryl as prescribed on the bottle for itching  IF YOU ARE IN A CAM BOOT (BLACK BOOT)  You may remove boot periodically. Perform daily dressing changes as noted below.  Wash the liner of the boot regularly and wear a sock when wearing the boot. It is recommended that you sleep in the boot until told otherwise  CALL THE OFFICE WITH ANY QUESTIONS OR CONCERNS: [phone]

## 2018-04-21 NOTE — Progress Notes (Signed)
PROGRESS NOTE  Casey Gilmore SWN:[id number] DOB: Aug 26, 1941 DOA: 04/15/2018 PCP: Mayra Neer, MD   LOS: 6 days   Brief Narrative / Interim history: 77 year old male with complex orthopedic history presents to the hospital after falling out of bed and developing severe leg pain.  He was admitted for hip fracture.  He was cleared by cardiology for surgery and underwent operative repair on 5/14.  Assessment & Plan: Principal Problem:   Closed left hip fracture (HCC) Active Problems:   Diabetes mellitus (HCC)   Chronic diastolic heart failure (HCC)   Aortic valve replaced   COPD (chronic obstructive pulmonary disease) with chronic bronchitis (HCC)   Anemia   Left hip fracture -Orthopedic surgery consulted and followed patient, he is status post operative repair on 5/14 by Dr. Marcelino Scot -He is doing relatively well postop, pain seems to be improving -PT recommends CIR, discussed with admission coordinator, awaiting insurance authorization -if declined for CIR will need to SNF  Chronic diastolic CHF -Stable, he appears euvolemic, continue Lasix  COPD -This appears stable, continue to monitor  Diabetes mellitus, uncontrolled -Continue sliding scale insulin, most recent A1c was 7.6, CBGs in the 180s  Iron and B12 deficiency anemia /component of acute blood loss anemia postoperatively -Hemoglobin stable, no need for transfusions  History of AS, post bioprosthesis AVR 2012 -Echo reassuring   DVT prophylaxis: Lovenox Code Status: Full code Family Communication: no family at bedside Disposition Plan: CIR versus SNF pending insurance authorization  Consultants:   Orthopedic surgery   Procedures:   2D echo:  Study Conclusions - Left ventricle: The cavity size was normal. Wall thickness was increased in a pattern of mild LVH. Systolic function was normal. The estimated ejection fraction was in the range of 60% to 65%. - Aortic valve: Bioprosthetic valve with acceptable  gradients DVI .5 suggesting normal function and no significant peri valvular regurgiation. Valve area (VTI): 0.88 cm^2. Valve area (Vmax): 0.77 cm^2. Valve area (Vmean): 0.73 cm^2. - Mitral valve: Valve area by continuity equation (using LVOT flow): 0.83 cm^2. - Atrial septum: No defect or patent foramen ovale was identified.   ORIF L femur 5/14  Antimicrobials:  None    Subjective: -No complaints this morning, eating breakfast, no chest pain, no shortness of breath.  No abdominal pain, nausea or vomiting  Objective: Vitals:   04/20/18 0804 04/20/18 1421 04/20/18 2029 04/21/18 0316  BP: (!) 108/56 114/63 114/71 120/72  Pulse: 98 88 82 83  Resp:  16  16  Temp:  98 F (36.7 C) 98.2 F (36.8 C) 98.9 F (37.2 C)  TempSrc:  Oral Oral Oral  SpO2:  95% 94% 94%  Weight:      Height:        Intake/Output Summary (Last 24 hours) at 04/21/2018 1310 Last data filed at 04/21/2018 0316 Gross per 24 hour  Intake 720 ml  Output 950 ml  Net -230 ml   Filed Weights   04/15/18 1032  Weight: 79.4 kg (175 lb)    Examination:  Constitutional: No distress Respiratory: CTA bilaterally Cardiovascular: regular rate and rhythm   Data Reviewed: I have independently reviewed following labs and imaging studies   CBC: Recent Labs  Lab 04/15/18 1050  04/18/18 1803 04/19/18 0309 04/19/18 1415 04/20/18 0339 04/21/18 0357  WBC 10.4   < > 7.9 7.2 7.0 7.4 7.4  NEUTROABS 7.7  --   --   --   --   --   --   HGB 8.8*   < >  9.9* 8.3* 8.7* 8.2* 8.9*  HCT 29.5*   < > 32.1* 27.9* 28.4* 27.4* 29.5*  MCV 76.8*   < > 77.3* 79.5 80.7 79.9 80.4  PLT 290   < > 247 241 217 232 241   < > = values in this interval not displayed.   Basic Metabolic Panel: Recent Labs  Lab 04/16/18 1340 04/17/18 0347 04/18/18 1803 04/19/18 0309 04/20/18 0339 04/21/18 0357  NA 136 138  --  136 135 137  K 4.7 4.3  --  4.6 4.2 4.2  CL 103 105  --  103 99* 99*  CO2 24 26  --  27 28 29   GLUCOSE 233* 189*  --  164*  165* 181*  BUN 18 17  --  15 15 15   CREATININE 1.13 1.09 1.12 1.14 1.15 1.18  CALCIUM 9.1 8.8*  --  8.3* 8.3* 8.9  MG  --  1.5*  --   --   --   --    GFR: Estimated Creatinine Clearance: 57.5 mL/min (by C-G formula based on SCr of 1.18 mg/dL). Liver Function Tests: Recent Labs  Lab 04/17/18 0347  AST 13*  ALT 9*  ALKPHOS 81  BILITOT 0.4  PROT 6.1*  ALBUMIN 3.0*   No results for input(s): LIPASE, AMYLASE in the last 168 hours. No results for input(s): AMMONIA in the last 168 hours. Coagulation Profile: Recent Labs  Lab 04/15/18 1050  INR 1.02   Cardiac Enzymes: No results for input(s): CKTOTAL, CKMB, CKMBINDEX, TROPONINI in the last 168 hours. BNP (last 3 results) No results for input(s): PROBNP in the last 8760 hours. HbA1C: No results for input(s): HGBA1C in the last 72 hours. CBG: Recent Labs  Lab 04/20/18 1229 04/20/18 1626 04/20/18 2148 04/21/18 0804 04/21/18 1137  GLUCAP 149* 193* 160* 181* 188*   Lipid Profile: No results for input(s): CHOL, HDL, LDLCALC, TRIG, CHOLHDL, LDLDIRECT in the last 72 hours. Thyroid Function Tests: No results for input(s): TSH, T4TOTAL, FREET4, T3FREE, THYROIDAB in the last 72 hours. Anemia Panel: No results for input(s): VITAMINB12, FOLATE, FERRITIN, TIBC, IRON, RETICCTPCT in the last 72 hours. Urine analysis:    Component Value Date/Time   COLORURINE YELLOW 10/12/2011 [place] 10/12/2011 1353   LABSPEC 1.026 10/12/2011 1353   PHURINE 5.5 10/12/2011 1353   GLUCOSEU NEGATIVE 10/12/2011 1353   HGBUR NEGATIVE 10/12/2011 1353   BILIRUBINUR NEGATIVE 10/12/2011 1353   KETONESUR NEGATIVE 10/12/2011 1353   PROTEINUR NEGATIVE 10/12/2011 1353   UROBILINOGEN 1.0 10/12/2011 1353   NITRITE NEGATIVE 10/12/2011 1353   LEUKOCYTESUR NEGATIVE 10/12/2011 1353   Sepsis Labs: Invalid input(s): PROCALCITONIN, LACTICIDVEN  Recent Results (from the past 240 hour(s))  Surgical pcr screen     Status: None   Collection  Time: 04/16/18 11:06 PM  Result Value Ref Range Status   MRSA, PCR NEGATIVE NEGATIVE Final   Staphylococcus aureus NEGATIVE NEGATIVE Final    Comment: (NOTE) The Xpert SA Assay (FDA approved for NASAL specimens in patients 63 years of age and older), is one component of a comprehensive surveillance program. It is not intended to diagnose infection nor to guide or monitor treatment. Performed at [facility], [place] [address], [place], [place] [address]       Radiology Studies: No results found.   Scheduled Meds: . acetaminophen  650 mg Oral Q6H  . acetaminophen  650 mg Oral Once  . docusate sodium  100 mg Oral BID  . enoxaparin (LOVENOX) injection  40 mg Subcutaneous Q24H  . ferrous sulfate  325 mg Oral BID WC  . furosemide  40 mg Oral Daily  . insulin aspart  0-5 Units Subcutaneous QHS  . insulin aspart  0-9 Units Subcutaneous TID WC  . pantoprazole  40 mg Oral Daily  . pravastatin  20 mg Oral Daily  . cyanocobalamin  4,000 mcg Oral Daily  . Vitamin D (Ergocalciferol)  50,000 Units Oral Q7 days   Continuous Infusions: . sodium chloride       Marzetta Board, MD, PhD [facility] Pager [phone]  If 7PM-7AM, please contact night-coverage [email or web address] Password [email or web address] 04/21/2018, 1:10 PM

## 2018-04-21 NOTE — Progress Notes (Signed)
Rehab admissions - I spoke with patient this am.  I have opened the case with [facility] insurance requesting acute inpatient rehab admission.  Will await response from insurance case manager.  Call me for questions.  #[phone]

## 2018-04-21 NOTE — Social Work (Signed)
CSW discussed case with Dr. Cruzita Lederer and patient has been referred to [facility] and they are working on getting insurance started. CSW will follow behind as if patient being declined for CIR, CSW will then obtain Insurance Auth for placement at that time.  Elissa Hefty, LCSW Clinical Social Worker [phone]

## 2018-04-21 NOTE — Plan of Care (Signed)
  Problem: Education: Goal: Knowledge of General Education information will improve Outcome: Progressing   Problem: Clinical Measurements: Goal: Will remain free from infection Outcome: Progressing   Problem: Activity: Goal: Risk for activity intolerance will decrease Outcome: Progressing   Problem: Nutrition: Goal: Adequate nutrition will be maintained Outcome: Progressing   Problem: Elimination: Goal: Will not experience complications related to bowel motility Outcome: Progressing   Problem: Pain Managment: Goal: General experience of comfort will improve Outcome: Progressing   

## 2018-04-22 LAB — GLUCOSE, CAPILLARY
GLUCOSE-CAPILLARY: 179 mg/dL — AB (ref 65–99)
GLUCOSE-CAPILLARY: 180 mg/dL — AB (ref 65–99)
GLUCOSE-CAPILLARY: 202 mg/dL — AB (ref 65–99)
Glucose-Capillary: 163 mg/dL — ABNORMAL HIGH (ref 65–99)

## 2018-04-22 NOTE — Plan of Care (Signed)
  Problem: Activity: Goal: Risk for activity intolerance will decrease Outcome: Progressing   Problem: Nutrition: Goal: Adequate nutrition will be maintained Outcome: Progressing   Problem: Elimination: Goal: Will not experience complications related to bowel motility Outcome: Progressing   Problem: Pain Managment: Goal: General experience of comfort will improve Outcome: Progressing   Problem: Skin Integrity: Goal: Risk for impaired skin integrity will decrease Outcome: Progressing   

## 2018-04-22 NOTE — Progress Notes (Signed)
PROGRESS NOTE  Casey Gilmore FYT:[id number] DOB: 1941/02/18 DOA: 04/15/2018 PCP: Mayra Neer, MD   LOS: 7 days   Brief Narrative / Interim history: 77 year old male with complex orthopedic history presents to the hospital after falling out of bed and developing severe leg pain.  He was admitted for hip fracture.  He was cleared by cardiology for surgery and underwent operative repair on 5/14.  Assessment & Plan: Principal Problem:   Closed left hip fracture (HCC) Active Problems:   Diabetes mellitus (HCC)   Chronic diastolic heart failure (HCC)   Aortic valve replaced   COPD (chronic obstructive pulmonary disease) with chronic bronchitis (HCC)   Anemia   Left hip fracture -Orthopedic surgery consulted and followed patient, he is status post operative repair on 5/14 by Dr. Marcelino Scot -He is doing well postop, pain seems to be improving -PT recommends CIR, discussed with admission coordinator, awaiting insurance authorization -if declined for CIR will need to SNF -stable today   Chronic diastolic CHF -appears euvolemic, continue Lasix  COPD -stable, continue to monitor  Diabetes mellitus, uncontrolled -Continue sliding scale insulin  Iron and B12 deficiency anemia /component of acute blood loss anemia postoperatively -Hemoglobin stable, repeat CBC in am   History of AS, post bioprosthesis AVR 2012 -Echo reassuring   DVT prophylaxis: Lovenox Code Status: Full code Family Communication: no family at bedside Disposition Plan: CIR versus SNF pending insurance authorization  Consultants:   Orthopedic surgery   Procedures:   2D echo:  Study Conclusions - Left ventricle: The cavity size was normal. Wall thickness was increased in a pattern of mild LVH. Systolic function was normal. The estimated ejection fraction was in the range of 60% to 65%. - Aortic valve: Bioprosthetic valve with acceptable gradients DVI .5 suggesting normal function and no significant peri  valvular regurgiation. Valve area (VTI): 0.88 cm^2. Valve area (Vmax): 0.77 cm^2. Valve area (Vmean): 0.73 cm^2. - Mitral valve: Valve area by continuity equation (using LVOT flow): 0.83 cm^2. - Atrial septum: No defect or patent foramen ovale was identified.   ORIF L femur 5/14  Antimicrobials:  None    Subjective: - no chest pain, shortness of breath, no abdominal pain, nausea or vomiting.   Objective: Vitals:   04/21/18 0316 04/21/18 1716 04/21/18 2050 04/22/18 0643  BP: 120/72 106/65 136/66 116/70  Pulse: 83 90 88   Resp: 16 17    Temp: 98.9 F (37.2 C) 97.7 F (36.5 C) 98 F (36.7 C) 98.7 F (37.1 C)  TempSrc: Oral Oral Oral Oral  SpO2: 94% 93% 93% 95%  Weight:      Height:        Intake/Output Summary (Last 24 hours) at 04/22/2018 1211 Last data filed at 04/21/2018 1700 Gross per 24 hour  Intake -  Output 580 ml  Net -580 ml   Filed Weights   04/15/18 1032  Weight: 79.4 kg (175 lb)    Examination:  Constitutional: NAD Respiratory: CTA Cardiovascular: RRR  Data Reviewed: I have independently reviewed following labs and imaging studies   CBC: Recent Labs  Lab 04/18/18 1803 04/19/18 0309 04/19/18 1415 04/20/18 0339 04/21/18 0357  WBC 7.9 7.2 7.0 7.4 7.4  HGB 9.9* 8.3* 8.7* 8.2* 8.9*  HCT 32.1* 27.9* 28.4* 27.4* 29.5*  MCV 77.3* 79.5 80.7 79.9 80.4  PLT 247 241 217 232 177   Basic Metabolic Panel: Recent Labs  Lab 04/16/18 1340 04/17/18 0347 04/18/18 1803 04/19/18 0309 04/20/18 0339 04/21/18 0357  NA 136 138  --  136 135 137  K 4.7 4.3  --  4.6 4.2 4.2  CL 103 105  --  103 99* 99*  CO2 24 26  --  27 28 29   GLUCOSE 233* 189*  --  164* 165* 181*  BUN 18 17  --  15 15 15   CREATININE 1.13 1.09 1.12 1.14 1.15 1.18  CALCIUM 9.1 8.8*  --  8.3* 8.3* 8.9  MG  --  1.5*  --   --   --   --    GFR: Estimated Creatinine Clearance: 57.5 mL/min (by C-G formula based on SCr of 1.18 mg/dL). Liver Function Tests: Recent Labs  Lab 04/17/18 0347    AST 13*  ALT 9*  ALKPHOS 81  BILITOT 0.4  PROT 6.1*  ALBUMIN 3.0*   No results for input(s): LIPASE, AMYLASE in the last 168 hours. No results for input(s): AMMONIA in the last 168 hours. Coagulation Profile: No results for input(s): INR, PROTIME in the last 168 hours. Cardiac Enzymes: No results for input(s): CKTOTAL, CKMB, CKMBINDEX, TROPONINI in the last 168 hours. BNP (last 3 results) No results for input(s): PROBNP in the last 8760 hours. HbA1C: No results for input(s): HGBA1C in the last 72 hours. CBG: Recent Labs  Lab 04/21/18 1137 04/21/18 1635 04/21/18 2048 04/22/18 0635 04/22/18 1148  GLUCAP 188* 263* 183* 179* 180*   Lipid Profile: No results for input(s): CHOL, HDL, LDLCALC, TRIG, CHOLHDL, LDLDIRECT in the last 72 hours. Thyroid Function Tests: No results for input(s): TSH, T4TOTAL, FREET4, T3FREE, THYROIDAB in the last 72 hours. Anemia Panel: No results for input(s): VITAMINB12, FOLATE, FERRITIN, TIBC, IRON, RETICCTPCT in the last 72 hours. Urine analysis:    Component Value Date/Time   COLORURINE YELLOW 10/12/2011 [place] 10/12/2011 1353   LABSPEC 1.026 10/12/2011 1353   PHURINE 5.5 10/12/2011 1353   GLUCOSEU NEGATIVE 10/12/2011 1353   HGBUR NEGATIVE 10/12/2011 1353   BILIRUBINUR NEGATIVE 10/12/2011 1353   KETONESUR NEGATIVE 10/12/2011 1353   PROTEINUR NEGATIVE 10/12/2011 1353   UROBILINOGEN 1.0 10/12/2011 1353   NITRITE NEGATIVE 10/12/2011 1353   LEUKOCYTESUR NEGATIVE 10/12/2011 1353   Sepsis Labs: Invalid input(s): PROCALCITONIN, LACTICIDVEN  Recent Results (from the past 240 hour(s))  Surgical pcr screen     Status: None   Collection Time: 04/16/18 11:06 PM  Result Value Ref Range Status   MRSA, PCR NEGATIVE NEGATIVE Final   Staphylococcus aureus NEGATIVE NEGATIVE Final    Comment: (NOTE) The Xpert SA Assay (FDA approved for NASAL specimens in patients 71 years of age and older), is one component of a  comprehensive surveillance program. It is not intended to diagnose infection nor to guide or monitor treatment. Performed at [facility], [place] [address] [place], [place] [address]       Radiology Studies: No results found.   Scheduled Meds: . acetaminophen  650 mg Oral Q6H  . acetaminophen  650 mg Oral Once  . docusate sodium  100 mg Oral BID  . enoxaparin (LOVENOX) injection  40 mg Subcutaneous Q24H  . ferrous sulfate  325 mg Oral BID WC  . furosemide  40 mg Oral Daily  . insulin aspart  0-5 Units Subcutaneous QHS  . insulin aspart  0-9 Units Subcutaneous TID WC  . pantoprazole  40 mg Oral Daily  . pravastatin  20 mg Oral Daily  . cyanocobalamin  4,000 mcg Oral Daily  . Vitamin D (Ergocalciferol)  50,000 Units Oral Q7 days   Continuous  Infusions: . sodium chloride       Marzetta Board, MD, PhD [facility] Pager [phone]  If 7PM-7AM, please contact night-coverage [email or web address] Password [facility] 04/22/2018, 12:11 PM

## 2018-04-23 LAB — BASIC METABOLIC PANEL
Anion gap: 10 (ref 5–15)
BUN: 21 mg/dL — ABNORMAL HIGH (ref 6–20)
CHLORIDE: 95 mmol/L — AB (ref 101–111)
CO2: 32 mmol/L (ref 22–32)
Calcium: 9 mg/dL (ref 8.9–10.3)
Creatinine, Ser: 1.25 mg/dL — ABNORMAL HIGH (ref 0.61–1.24)
GFR calc Af Amer: >60 mL/min (ref >60)
GFR calc non Af Amer: 54 mL/min — ABNORMAL LOW (ref >60)
GLUCOSE: 181 mg/dL — AB (ref 65–99)
POTASSIUM: 4.1 mmol/L (ref 3.5–5.1)
Sodium: 137 mmol/L (ref 135–145)

## 2018-04-23 LAB — CBC
HEMATOCRIT: 31.4 % — AB (ref 39.0–52.0)
HEMOGLOBIN: 9.4 g/dL — AB (ref 13.0–17.0)
MCH: 23.6 pg — AB (ref 26.0–34.0)
MCHC: 29.9 g/dL — ABNORMAL LOW (ref 30.0–36.0)
MCV: 78.9 fL (ref 78.0–100.0)
Platelets: 266 10*3/uL (ref 150–400)
RBC: 3.98 MIL/uL — AB (ref 4.22–5.81)
RDW: 18.9 % — ABNORMAL HIGH (ref 11.5–15.5)
WBC: 6.4 10*3/uL (ref 4.0–10.5)

## 2018-04-23 LAB — GLUCOSE, CAPILLARY
GLUCOSE-CAPILLARY: 169 mg/dL — AB (ref 65–99)
GLUCOSE-CAPILLARY: 192 mg/dL — AB (ref 65–99)
Glucose-Capillary: 193 mg/dL — ABNORMAL HIGH (ref 65–99)
Glucose-Capillary: 186 mg/dL — ABNORMAL HIGH (ref 65–99)

## 2018-04-23 NOTE — Progress Notes (Signed)
PROGRESS NOTE  Casey Gilmore ZOX:[id number] DOB: 1941/04/25 DOA: 04/15/2018 PCP: Mayra Neer, MD   LOS: 8 days   Brief Narrative / Interim history: 77 year old male with complex orthopedic history presents to the hospital after falling out of bed and developing severe leg pain.  He was admitted for hip fracture.  He was cleared by cardiology for surgery and underwent operative repair on 5/14.  Assessment & Plan: Principal Problem:   Closed left hip fracture (HCC) Active Problems:   Diabetes mellitus (HCC)   Chronic diastolic heart failure (HCC)   Aortic valve replaced   COPD (chronic obstructive pulmonary disease) with chronic bronchitis (HCC)   Anemia   Left hip fracture -Orthopedic surgery consulted and followed patient, he is status post operative repair on 5/14 by Dr. Marcelino Scot -He is doing well postop, pain seems to be improving -PT recommends CIR, discussed with admission coordinator, awaiting insurance authorization -if declined for CIR will need to SNF -stable, continue pain control and DVT prophylaxis with Lovenox  Chronic diastolic CHF -appears euvolemic, slight bump in creatinine, hold Lasix today  COPD -stable, continue to monitor, no wheezing  Diabetes mellitus, uncontrolled -CBG this morning 193, continue to cover with sliding scale  Iron and B12 deficiency anemia /component of acute blood loss anemia postoperatively -Hemoglobin 9.4 this morning, slightly up compared to yesterday, stable  History of AS, post bioprosthesis AVR 2012 -Echo reassuring   DVT prophylaxis: Lovenox Code Status: Full code Family Communication: Son present at bedside Disposition Plan: CIR versus SNF pending insurance authorization  Consultants:   Orthopedic surgery   Procedures:   2D echo:  Study Conclusions - Left ventricle: The cavity size was normal. Wall thickness was increased in a pattern of mild LVH. Systolic function was normal. The estimated ejection fraction  was in the range of 60% to 65%. - Aortic valve: Bioprosthetic valve with acceptable gradients DVI .5 suggesting normal function and no significant peri valvular regurgiation. Valve area (VTI): 0.88 cm^2. Valve area (Vmax): 0.77 cm^2. Valve area (Vmean): 0.73 cm^2. - Mitral valve: Valve area by continuity equation (using LVOT flow): 0.83 cm^2. - Atrial septum: No defect or patent foramen ovale was identified.   ORIF L femur 5/14  Antimicrobials:  None    Subjective: -Feels well this morning, no chest pain, no shortness of breath, no cough or chest congestion  Objective: Vitals:   04/22/18 1452 04/22/18 2018 04/22/18 2300 04/23/18 0454  BP: 125/66 123/63  (!) 149/74  Pulse: 80 87  77  Resp: 18   16  Temp: 98.7 F (37.1 C) 98.8 F (37.1 C)  98.2 F (36.8 C)  TempSrc: Oral Oral  Oral  SpO2: 92% 92%  94%  Weight:   81.3 kg (179 lb 3.7 oz)   Height:        Intake/Output Summary (Last 24 hours) at 04/23/2018 1036 Last data filed at 04/22/2018 2020 Gross per 24 hour  Intake -  Output 600 ml  Net -600 ml   Filed Weights   04/15/18 1032 04/22/18 2300  Weight: 79.4 kg (175 lb) 81.3 kg (179 lb 3.7 oz)    Examination:  Constitutional: NAD, calm, comfortable Eyes: PERRL, lids and conjunctivae normal ENMT: Mucous membranes are moist.  Neck: normal, supple Respiratory: clear to auscultation bilaterally, no wheezing, no crackles. Normal respiratory effort.  Cardiovascular: Regular rate and rhythm, no murmurs / rubs / gallops. No LE edema. 2+ pedal pulses.  Abdomen: no tenderness. Bowel sounds positive.  Skin: no rashes, lesions, ulcers.  No induration Neurologic: non focal   Data Reviewed: I have independently reviewed following labs and imaging studies   CBC: Recent Labs  Lab 04/19/18 0309 04/19/18 1415 04/20/18 0339 04/21/18 0357 04/23/18 0420  WBC 7.2 7.0 7.4 7.4 6.4  HGB 8.3* 8.7* 8.2* 8.9* 9.4*  HCT 27.9* 28.4* 27.4* 29.5* 31.4*  MCV 79.5 80.7 79.9 80.4 78.9    PLT 241 217 232 241 382   Basic Metabolic Panel: Recent Labs  Lab 04/17/18 0347 04/18/18 1803 04/19/18 0309 04/20/18 0339 04/21/18 0357 04/23/18 0420  NA 138  --  136 135 137 137  K 4.3  --  4.6 4.2 4.2 4.1  CL 105  --  103 99* 99* 95*  CO2 26  --  27 28 29  32  GLUCOSE 189*  --  164* 165* 181* 181*  BUN 17  --  15 15 15  21*  CREATININE 1.09 1.12 1.14 1.15 1.18 1.25*  CALCIUM 8.8*  --  8.3* 8.3* 8.9 9.0  MG 1.5*  --   --   --   --   --    GFR: Estimated Creatinine Clearance: 54.3 mL/min (A) (by C-G formula based on SCr of 1.25 mg/dL (H)). Liver Function Tests: Recent Labs  Lab 04/17/18 0347  AST 13*  ALT 9*  ALKPHOS 81  BILITOT 0.4  PROT 6.1*  ALBUMIN 3.0*   No results for input(s): LIPASE, AMYLASE in the last 168 hours. No results for input(s): AMMONIA in the last 168 hours. Coagulation Profile: No results for input(s): INR, PROTIME in the last 168 hours. Cardiac Enzymes: No results for input(s): CKTOTAL, CKMB, CKMBINDEX, TROPONINI in the last 168 hours. BNP (last 3 results) No results for input(s): PROBNP in the last 8760 hours. HbA1C: No results for input(s): HGBA1C in the last 72 hours. CBG: Recent Labs  Lab 04/22/18 0635 04/22/18 1148 04/22/18 1638 04/22/18 2201 04/23/18 0620  GLUCAP 179* 180* 202* 163* 193*   Lipid Profile: No results for input(s): CHOL, HDL, LDLCALC, TRIG, CHOLHDL, LDLDIRECT in the last 72 hours. Thyroid Function Tests: No results for input(s): TSH, T4TOTAL, FREET4, T3FREE, THYROIDAB in the last 72 hours. Anemia Panel: No results for input(s): VITAMINB12, FOLATE, FERRITIN, TIBC, IRON, RETICCTPCT in the last 72 hours. Urine analysis:    Component Value Date/Time   COLORURINE YELLOW 10/12/2011 [place] 10/12/2011 1353   LABSPEC 1.026 10/12/2011 1353   PHURINE 5.5 10/12/2011 1353   GLUCOSEU NEGATIVE 10/12/2011 1353   HGBUR NEGATIVE 10/12/2011 1353   BILIRUBINUR NEGATIVE 10/12/2011 1353   KETONESUR NEGATIVE  10/12/2011 1353   PROTEINUR NEGATIVE 10/12/2011 1353   UROBILINOGEN 1.0 10/12/2011 1353   NITRITE NEGATIVE 10/12/2011 1353   LEUKOCYTESUR NEGATIVE 10/12/2011 1353   Sepsis Labs: Invalid input(s): PROCALCITONIN, LACTICIDVEN  Recent Results (from the past 240 hour(s))  Surgical pcr screen     Status: None   Collection Time: 04/16/18 11:06 PM  Result Value Ref Range Status   MRSA, PCR NEGATIVE NEGATIVE Final   Staphylococcus aureus NEGATIVE NEGATIVE Final    Comment: (NOTE) The Xpert SA Assay (FDA approved for NASAL specimens in patients 24 years of age and older), is one component of a comprehensive surveillance program. It is not intended to diagnose infection nor to guide or monitor treatment. Performed at [facility], [place] [address] [place], [place] [address]       Radiology Studies: No results found.   Scheduled Meds: . acetaminophen  650 mg Oral Q6H  .  acetaminophen  650 mg Oral Once  . docusate sodium  100 mg Oral BID  . enoxaparin (LOVENOX) injection  40 mg Subcutaneous Q24H  . ferrous sulfate  325 mg Oral BID WC  . insulin aspart  0-5 Units Subcutaneous QHS  . insulin aspart  0-9 Units Subcutaneous TID WC  . pantoprazole  40 mg Oral Daily  . pravastatin  20 mg Oral Daily  . cyanocobalamin  4,000 mcg Oral Daily  . Vitamin D (Ergocalciferol)  50,000 Units Oral Q7 days   Continuous Infusions: . sodium chloride       Marzetta Board, MD, PhD [facility] Pager [phone]  If 7PM-7AM, please contact night-coverage [email or web address] Password [facility] 04/23/2018, 10:36 AM

## 2018-04-24 ENCOUNTER — Other Ambulatory Visit: Payer: Self-pay

## 2018-04-24 ENCOUNTER — Encounter (HOSPITAL_COMMUNITY): Payer: Self-pay | Admitting: *Deleted

## 2018-04-24 ENCOUNTER — Inpatient Hospital Stay (HOSPITAL_COMMUNITY)
Admission: RE | Admit: 2018-04-24 | Discharge: 2018-05-12 | DRG: 560 | Disposition: A | Discharge status: Skilled Nursing Facility | Payer: Medicare Other | Source: Intra-hospital | Attending: Physical Medicine & Rehabilitation | Admitting: Physical Medicine & Rehabilitation

## 2018-04-24 DIAGNOSIS — E119 Type 2 diabetes mellitus without complications: Secondary | ICD-10-CM | POA: Diagnosis not present

## 2018-04-24 DIAGNOSIS — K5900 Constipation, unspecified: Secondary | ICD-10-CM | POA: Diagnosis not present

## 2018-04-24 DIAGNOSIS — K219 Gastro-esophageal reflux disease without esophagitis: Secondary | ICD-10-CM | POA: Diagnosis present

## 2018-04-24 DIAGNOSIS — I252 Old myocardial infarction: Secondary | ICD-10-CM

## 2018-04-24 DIAGNOSIS — G894 Chronic pain syndrome: Secondary | ICD-10-CM | POA: Diagnosis not present

## 2018-04-24 DIAGNOSIS — S7222XD Displaced subtrochanteric fracture of left femur, subsequent encounter for closed fracture with routine healing: Principal | ICD-10-CM

## 2018-04-24 DIAGNOSIS — J449 Chronic obstructive pulmonary disease, unspecified: Secondary | ICD-10-CM | POA: Diagnosis present

## 2018-04-24 DIAGNOSIS — Z8501 Personal history of malignant neoplasm of esophagus: Secondary | ICD-10-CM | POA: Diagnosis not present

## 2018-04-24 DIAGNOSIS — W06XXXD Fall from bed, subsequent encounter: Secondary | ICD-10-CM | POA: Diagnosis present

## 2018-04-24 DIAGNOSIS — D62 Acute posthemorrhagic anemia: Secondary | ICD-10-CM | POA: Diagnosis not present

## 2018-04-24 DIAGNOSIS — D638 Anemia in other chronic diseases classified elsewhere: Secondary | ICD-10-CM | POA: Diagnosis not present

## 2018-04-24 DIAGNOSIS — S7222XA Displaced subtrochanteric fracture of left femur, initial encounter for closed fracture: Secondary | ICD-10-CM | POA: Diagnosis present

## 2018-04-24 DIAGNOSIS — G8918 Other acute postprocedural pain: Secondary | ICD-10-CM

## 2018-04-24 DIAGNOSIS — Z7984 Long term (current) use of oral hypoglycemic drugs: Secondary | ICD-10-CM | POA: Diagnosis not present

## 2018-04-24 DIAGNOSIS — I10 Essential (primary) hypertension: Secondary | ICD-10-CM | POA: Diagnosis present

## 2018-04-24 DIAGNOSIS — I350 Nonrheumatic aortic (valve) stenosis: Secondary | ICD-10-CM

## 2018-04-24 DIAGNOSIS — Z953 Presence of xenogenic heart valve: Secondary | ICD-10-CM

## 2018-04-24 DIAGNOSIS — Z96641 Presence of right artificial hip joint: Secondary | ICD-10-CM | POA: Diagnosis present

## 2018-04-24 DIAGNOSIS — Z7401 Bed confinement status: Secondary | ICD-10-CM | POA: Diagnosis not present

## 2018-04-24 DIAGNOSIS — Z8719 Personal history of other diseases of the digestive system: Secondary | ICD-10-CM

## 2018-04-24 DIAGNOSIS — S7222XS Displaced subtrochanteric fracture of left femur, sequela: Secondary | ICD-10-CM

## 2018-04-24 DIAGNOSIS — E8809 Other disorders of plasma-protein metabolism, not elsewhere classified: Secondary | ICD-10-CM | POA: Diagnosis present

## 2018-04-24 DIAGNOSIS — Z96652 Presence of left artificial knee joint: Secondary | ICD-10-CM | POA: Diagnosis not present

## 2018-04-24 DIAGNOSIS — E785 Hyperlipidemia, unspecified: Secondary | ICD-10-CM | POA: Diagnosis not present

## 2018-04-24 DIAGNOSIS — M7989 Other specified soft tissue disorders: Secondary | ICD-10-CM | POA: Diagnosis not present

## 2018-04-24 DIAGNOSIS — D500 Iron deficiency anemia secondary to blood loss (chronic): Secondary | ICD-10-CM | POA: Diagnosis not present

## 2018-04-24 DIAGNOSIS — S7292XD Unspecified fracture of left femur, subsequent encounter for closed fracture with routine healing: Secondary | ICD-10-CM | POA: Diagnosis not present

## 2018-04-24 DIAGNOSIS — R2689 Other abnormalities of gait and mobility: Secondary | ICD-10-CM | POA: Diagnosis not present

## 2018-04-24 DIAGNOSIS — D508 Other iron deficiency anemias: Secondary | ICD-10-CM | POA: Diagnosis not present

## 2018-04-24 DIAGNOSIS — I951 Orthostatic hypotension: Secondary | ICD-10-CM

## 2018-04-24 DIAGNOSIS — K5903 Drug induced constipation: Secondary | ICD-10-CM | POA: Diagnosis not present

## 2018-04-24 DIAGNOSIS — Z87891 Personal history of nicotine dependence: Secondary | ICD-10-CM | POA: Diagnosis not present

## 2018-04-24 DIAGNOSIS — S7225XS Nondisplaced subtrochanteric fracture of left femur, sequela: Secondary | ICD-10-CM | POA: Diagnosis not present

## 2018-04-24 DIAGNOSIS — M1991 Primary osteoarthritis, unspecified site: Secondary | ICD-10-CM | POA: Diagnosis not present

## 2018-04-24 DIAGNOSIS — M6281 Muscle weakness (generalized): Secondary | ICD-10-CM | POA: Diagnosis not present

## 2018-04-24 DIAGNOSIS — E46 Unspecified protein-calorie malnutrition: Secondary | ICD-10-CM

## 2018-04-24 DIAGNOSIS — M2550 Pain in unspecified joint: Secondary | ICD-10-CM | POA: Diagnosis not present

## 2018-04-24 LAB — CBC
HCT: 30.9 % — ABNORMAL LOW (ref 39.0–52.0)
HEMOGLOBIN: 9.1 g/dL — AB (ref 13.0–17.0)
MCH: 23.5 pg — AB (ref 26.0–34.0)
MCHC: 29.4 g/dL — AB (ref 30.0–36.0)
MCV: 79.6 fL (ref 78.0–100.0)
Platelets: 293 10*3/uL (ref 150–400)
RBC: 3.88 MIL/uL — ABNORMAL LOW (ref 4.22–5.81)
RDW: 18.8 % — ABNORMAL HIGH (ref 11.5–15.5)
WBC: 7.2 10*3/uL (ref 4.0–10.5)

## 2018-04-24 LAB — CREATININE, SERUM
CREATININE: 1.35 mg/dL — AB (ref 0.61–1.24)
GFR calc Af Amer: 57 mL/min — ABNORMAL LOW (ref >60)
GFR calc non Af Amer: 49 mL/min — ABNORMAL LOW (ref >60)

## 2018-04-24 LAB — GLUCOSE, CAPILLARY
GLUCOSE-CAPILLARY: 183 mg/dL — AB (ref 65–99)
GLUCOSE-CAPILLARY: 159 mg/dL — AB (ref 65–99)
Glucose-Capillary: 170 mg/dL — ABNORMAL HIGH (ref 65–99)
Glucose-Capillary: 218 mg/dL — ABNORMAL HIGH (ref 65–99)

## 2018-04-24 MED ORDER — OXYCODONE HCL 5 MG PO TABS
5.0000 mg | ORAL_TABLET | ORAL | Status: DC | PRN
  Administered 2018-04-24 – 2018-04-27 (×11): 10 mg via ORAL
  Filled 2018-04-24 (×11): qty 2

## 2018-04-24 MED ORDER — ENOXAPARIN SODIUM 40 MG/0.4ML ~~LOC~~ SOLN
40.0000 mg | SUBCUTANEOUS | Status: DC
  Administered 2018-04-25 – 2018-05-12 (×18): 40 mg via SUBCUTANEOUS
  Filled 2018-04-24 (×18): qty 0.4

## 2018-04-24 MED ORDER — VITAMIN D (ERGOCALCIFEROL) 1.25 MG (50000 UNIT) PO CAPS
50000.0000 [IU] | ORAL_CAPSULE | ORAL | Status: DC
  Administered 2018-04-30 – 2018-05-07 (×2): 50000 [IU] via ORAL
  Filled 2018-04-24 (×2): qty 1

## 2018-04-24 MED ORDER — SORBITOL 70 % SOLN
30.0000 mL | Freq: Every day | Status: DC | PRN
  Administered 2018-04-25 – 2018-04-28 (×3): 30 mL via ORAL
  Filled 2018-04-24 (×3): qty 30

## 2018-04-24 MED ORDER — VITAMIN B-12 1000 MCG PO TABS
1000.0000 ug | ORAL_TABLET | Freq: Every day | ORAL | Status: DC
Start: 2018-04-24 — End: 2018-04-24

## 2018-04-24 MED ORDER — ENOXAPARIN SODIUM 40 MG/0.4ML ~~LOC~~ SOLN: 40.0000 mg | SUBCUTANEOUS | Status: DC

## 2018-04-24 MED ORDER — OXYCODONE HCL 5 MG PO TABS: 5.0000 mg | ORAL_TABLET | ORAL | 0 refills | Status: DC | PRN

## 2018-04-24 MED ORDER — ONDANSETRON HCL 4 MG PO TABS: 4.0000 mg | ORAL_TABLET | Freq: Four times a day (QID) | ORAL | Status: DC | PRN

## 2018-04-24 MED ORDER — FERROUS SULFATE 325 (65 FE) MG PO TABS
325.0000 mg | ORAL_TABLET | Freq: Two times a day (BID) | ORAL | Status: DC
  Administered 2018-04-24 – 2018-05-12 (×36): 325 mg via ORAL
  Filled 2018-04-24 (×36): qty 1

## 2018-04-24 MED ORDER — VITAMIN B-12 1000 MCG PO TABS
4000.0000 ug | ORAL_TABLET | Freq: Every day | ORAL | Status: DC
  Administered 2018-04-25 – 2018-05-12 (×18): 4000 ug via ORAL
  Filled 2018-04-24 (×18): qty 4

## 2018-04-24 MED ORDER — ONDANSETRON HCL 4 MG/2ML IJ SOLN: 4.0000 mg | Freq: Four times a day (QID) | INTRAMUSCULAR | Status: DC | PRN

## 2018-04-24 MED ORDER — FERROUS SULFATE 325 (65 FE) MG PO TABS: 325.0000 mg | ORAL_TABLET | Freq: Two times a day (BID) | ORAL | 3 refills | Status: DC

## 2018-04-24 MED ORDER — DOCUSATE SODIUM 100 MG PO CAPS
100.0000 mg | ORAL_CAPSULE | Freq: Two times a day (BID) | ORAL | Status: DC
  Administered 2018-04-24 – 2018-04-27 (×6): 100 mg via ORAL
  Filled 2018-04-24 (×6): qty 1

## 2018-04-24 MED ORDER — PRAVASTATIN SODIUM 20 MG PO TABS
20.0000 mg | ORAL_TABLET | Freq: Every day | ORAL | Status: DC
  Administered 2018-04-25 – 2018-05-12 (×18): 20 mg via ORAL
  Filled 2018-04-24 (×18): qty 1

## 2018-04-24 MED ORDER — INSULIN ASPART 100 UNIT/ML ~~LOC~~ SOLN
0.0000 [IU] | Freq: Every day | SUBCUTANEOUS | Status: DC
  Administered 2018-04-24 – 2018-04-26 (×3): 2 [IU] via SUBCUTANEOUS
  Administered 2018-04-27: 3 [IU] via SUBCUTANEOUS

## 2018-04-24 MED ORDER — PANTOPRAZOLE SODIUM 40 MG PO TBEC
40.0000 mg | DELAYED_RELEASE_TABLET | Freq: Every day | ORAL | Status: DC
  Administered 2018-04-25 – 2018-05-12 (×18): 40 mg via ORAL
  Filled 2018-04-24 (×18): qty 1

## 2018-04-24 MED ORDER — ACETAMINOPHEN 325 MG PO TABS
325.0000 mg | ORAL_TABLET | ORAL | Status: DC | PRN
  Administered 2018-04-24 – 2018-05-11 (×30): 650 mg via ORAL
  Filled 2018-04-24 (×30): qty 2

## 2018-04-24 MED ORDER — ONDANSETRON HCL 4 MG PO TABS: 4.0000 mg | ORAL_TABLET | Freq: Three times a day (TID) | ORAL | Status: DC | PRN

## 2018-04-24 NOTE — Care Management Note (Signed)
Case Management Note  Patient Details  Name: Casey Gilmore MRN: [id number] Date of Birth: [birth year]  Subjective/Objective:   ORIF of left femur            Action/Plan: NCM spoke to pt and states he lives alone. He has several canes at home. States his son and brother will take to his appts. Contacted Scientist, research (physical sciences). Insurance is pending for CIR.  Expected Discharge Date:                 Expected Discharge Plan:  [place]  In-House Referral:  Clinical Social Work  Discharge planning Services  CM Consult  Post Acute Care Choice:  NA Choice offered to:  NA  DME Arranged:  N/A DME Agency:  NA  HH Arranged:  NA HH Agency:  NA  Status of Service:  Completed, signed off  If discussed at [employer] of Stay Meetings, dates discussed:    Additional Comments:  Erenest Rasher, RN 04/24/2018, 11:34 AM

## 2018-04-24 NOTE — PMR Pre-admission (Addendum)
PMR Admission Coordinator Pre-Admission Assessment  Patient: Casey Gilmore is an 77 y.o., male MRN: [id number] DOB: 04/26/1941 Height: 6' (182.9 cm) Weight: 81.3 kg (179 lb 3.7 oz)              Insurance Information HMO: Yes   PPO:       PCP:       IPA:       80/20:       OTHER:   PRIMARY:  BCBS Medicare      Policy#: [id number]      Subscriber:  patient CM Name: Limmie Patricia      Phone#: [phone]     Fax#: [phone] Pre-Cert#:                                    Employer: Retired Benefits:  Phone #:  [phone]     Name:  On line Eff. Date: 12/07/15     Deduct:  $0      Out of Pocket Max: $5500 (met $1461.76      Life Max:  N/A CIR: $310 days 1-6      SNF:  $0 days 1-20; $172 days 21-60; $0 days 61-100 Outpatient: medical necessity     Co-Pay: $40/visit Home Health: 1005      Co-Pay: none DME: 80%     Co-Pay: 20% Providers: in network  Emergency Contact Information Contact Information    Name Relation Home Work Lowell Son [phone]     Dezmin, Kitt[place] Loney Laurence [phone]       Current Medical History  Patient Admitting Diagnosis:  L subtrochanteric hip fracture  History of Present Illness: a 77 year old right-handed male with history of COPD, left hip fusion as a youth, diabetes mellitus, AVR, esophageal cancer and chronic anemia as well as recent fall with left humeral and radial ulnar fractures.. Per chart review patient lives alone.  Independent with assistive device.  One level home with 4 steps to entry.  Patient has a son and a brother who take him to the necessary appointments.  Family planning on assistance as needed.  Presented 04/15/2018 after a fall out of bed without loss of consciousness and complains of left hip pain.  X-rays and imaging revealed complex left subtrochanteric hip fracture.  Patient with chronic shortness of breath cardiology services consulted for surgical clearance.  Echocardiogram showed ejection fraction 65% with  mild LVH normal function of bioprosthetic AV.  Underwent removal of left femur/hip fusion hardware with ORIF left proximal femur fracture by Dr. Marcelino Scot 04/18/2018.  Touchdown weightbearing left lower extremity with unrestricted movement of ankle and knee.  Hospital course pain management.  Subcutaneous Lovenox for DVT prophylaxis.  Hemoglobin remained stable at 9.4.  Physical and occupational therapy evaluations completed. Patient to be admitted for a comprehensive inpatient rehab program.  Past Medical History  Past Medical History:  Diagnosis Date  . Anemia    2012  . Arthritis   . Blood transfusion   . 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 Closed fracture of left distal radius and ulna 01/19/2018  . Closed fracture of left proximal humerus 01/19/2018  . COPD (chronic obstructive pulmonary disease) ([place])   . Diabetes mellitus   . GERD (gastroesophageal reflux disease)   . Heart murmur   . Hiatal hernia   . Hyperlipidemia   . Hypertension   . NSTEMI (non-ST elevated myocardial infarction) (  [place]) 07/2011   in setting of Pneumonia with normal coronary arteries on cath  . Pneumonia hx 8/12  . Severe aortic stenosis 2012   s/p bioprosthetic AVR  . Shortness of breath    with exertion  . Upper GI bleeding     Family History  family history is not on file.  Prior Rehab/Hospitalizations: Patient fell 3-4 months ago and has been receiving HH therapies for L shoulder and left wrist.  Has the patient had major surgery during 100 days prior to admission? Yes.  Had surgery 3-4 months ago on his left shoulder and left wrist.  Current Medications   Current Facility-Administered Medications:  .  0.9 %  sodium chloride infusion, , Intravenous, Once, Ainsley Spinner, PA-C .  acetaminophen (TYLENOL) tablet 650 mg, 650 mg, Oral, Q6H, Ainsley Spinner, PA-C, 650 mg at 04/24/18 0846 .  acetaminophen (TYLENOL) tablet 650 mg, 650 mg, Oral, Once, Ainsley Spinner, PA-C .  docusate sodium (COLACE) capsule 100 mg, 100 mg,  Oral, BID, Ainsley Spinner, PA-C, 100 mg at 04/24/18 1110 .  enoxaparin (LOVENOX) injection 40 mg, 40 mg, Subcutaneous, Q24H, Ainsley Spinner, PA-C, 40 mg at 04/24/18 0847 .  ferrous sulfate tablet 325 mg, 325 mg, Oral, BID WC, Ainsley Spinner, PA-C, 325 mg at 04/24/18 0846 .  insulin aspart (novoLOG) injection 0-5 Units, 0-5 Units, Subcutaneous, QHS, Ainsley Spinner, PA-C .  insulin aspart (novoLOG) injection 0-9 Units, 0-9 Units, Subcutaneous, TID WC, Ainsley Spinner, PA-C, 2 Units at 04/24/18 1323 .  morphine 2 MG/ML injection 2 mg, 2 mg, Intravenous, Q3H PRN, Ainsley Spinner, PA-C, 2 mg at 04/18/18 1725 .  ondansetron (ZOFRAN) tablet 4 mg, 4 mg, Oral, Q6H PRN, 4 mg at 04/20/18 0759 **OR** ondansetron (ZOFRAN) injection 4 mg, 4 mg, Intravenous, Q6H PRN, Ainsley Spinner, PA-C, 4 mg at 04/18/18 1424 .  ondansetron (ZOFRAN) tablet 4 mg, 4 mg, Oral, Q8H PRN, Ainsley Spinner, PA-C .  oxyCODONE (Oxy IR/ROXICODONE) immediate release tablet 5-10 mg, 5-10 mg, Oral, Q4H PRN, Ainsley Spinner, PA-C, 10 mg at 04/23/18 1338 .  pantoprazole (PROTONIX) EC tablet 40 mg, 40 mg, Oral, Daily, Ainsley Spinner, PA-C, 40 mg at 04/24/18 1110 .  pravastatin (PRAVACHOL) tablet 20 mg, 20 mg, Oral, Daily, Ainsley Spinner, PA-C, 20 mg at 04/24/18 1110 .  sorbitol 70 % solution 30 mL, 30 mL, Oral, Daily PRN, Ainsley Spinner, PA-C .  vitamin B-12 (CYANOCOBALAMIN) tablet 4,000 mcg, 4,000 mcg, Oral, Daily, Ainsley Spinner, PA-C, 4,000 mcg at 04/24/18 1030 .  Vitamin D (Ergocalciferol) (DRISDOL) capsule 50,000 Units, 50,000 Units, Oral, Q7 days, Ainsley Spinner, PA-C, 50,000 Units at 04/23/18 0935  Patients Current Diet:  Diet Order           Diet Carb Modified Fluid consistency: Thin; Room service appropriate? Yes  Diet effective now          Precautions / Restrictions Precautions Precautions: Fall Restrictions Weight Bearing Restrictions: Yes LLE Weight Bearing: Touchdown weight bearing   Has the patient had 2 or more falls or a fall with injury in the past year?Yes.   Has suffered 2 falls with injury.    Prior Activity Level Household: Went out 1-3 times a month to pick up medications; drives on occasion.  Home Assistive Devices / Equipment Home Assistive Devices/Equipment: None Home Equipment: Cane - single point  Prior Device Use: Indicate devices/aids used by the patient prior to current illness, exacerbation or injury? Cane  Prior Functional Level Prior Function Level of Independence: Independent with assistive device(s)  Self Care: Did the patient need help bathing, dressing, using the toilet or eating?  Independent  Indoor Mobility: Did the patient need assistance with walking from room to room (with or without device)? Independent  Stairs: Did the patient need assistance with internal or external stairs (with or without device)? Independent  Functional Cognition: Did the patient need help planning regular tasks such as shopping or remembering to take medications? Independent  Current Functional Level Cognition  Overall Cognitive Status: No family/caregiver present to determine baseline cognitive functioning Orientation Level: Oriented to person Following Commands: Follows multi-step commands inconsistently General Comments: Pt with difficulty reporting full understanding of his current situation.     Extremity Assessment (includes Sensation/Coordination)  Upper Extremity Assessment: LUE deficits/detail LUE Deficits / Details: 3 months post wrist and shoulder surgery. Pt not to push/pull with L UE. Limited AROM.   Lower Extremity Assessment: Defer to PT evaluation RLE Deficits / Details: stiff and limited by pain LLE Deficits / Details: limited ROM at hip and knee,  pain limiting LLE Coordination: decreased gross motor, decreased fine motor    ADLs  Overall ADL's : Needs assistance/impaired Eating/Feeding: Minimal assistance, Bed level Grooming: Minimal assistance, Bed level Upper Body Bathing: Maximal assistance, Bed level Lower  Body Bathing: Total assistance, Bed level Upper Body Dressing : Maximal assistance, Bed level Lower Body Dressing: Total assistance, Bed level General ADL Comments: Pt able to sit at EOB but unable to achieve midline positioning due to pain and limited L knee ROM.     Mobility  Overal bed mobility: Needs Assistance Bed Mobility: Supine to Sit, Sit to Supine Supine to sit: Max assist, +2 for physical assistance Sit to supine: Total assist, +2 for physical assistance General bed mobility comments: Pt able to initiate movement and attempt bridging to transition to EOB but requiring max assist +2 to complete sit<>supine. Difficulty achieving fully upright position secondary to pain and decreased L hip flexion.     Transfers  General transfer comment: unable    Ambulation / Gait / Stairs / Emergency planning/management officer  Ambulation/Gait General Gait Details: unable    Posture / Balance Dynamic Sitting Balance Sitting balance - Comments: heavy R lateral lean Balance Overall balance assessment: Needs assistance Sitting-balance support: Feet supported, Bilateral upper extremity supported Sitting balance-Leahy Scale: Poor Sitting balance - Comments: heavy R lateral lean Postural control: Right lateral lean, Posterior lean    Special needs/care consideration BiPAP/CPAP No CPM No Continuous Drip IV KVO Dialysis No        Life Vest No Oxygen No Special Bed No Trach Size No Wound Vac (area) No      Skin Left hip incision post op                               Bowel mgmt: Last BM 04/22/18 Bladder mgmt: Voiding WDL Diabetic mgmt Yes, on insulin at home    Previous Home Environment Living Arrangements: Alone Available Help at Discharge: Family, Available PRN/intermittently Type of Home: House Home Layout: One level Home Access: Stairs to enter Entrance Stairs-Rails: Left, Right Entrance Stairs-Number of Steps: 4 Bathroom Shower/Tub: Multimedia programmer: Handicapped height Home Care  Services: No  Discharge Living Setting Plans for Discharge Living Setting: Alone, Other (Comment)(Lives alone in a modular home.) Type of Home at Discharge: Other (Comment)(Modular home) Discharge Home Layout: One level Discharge Home Access: Stairs to enter Entrance Stairs-Number of Steps: 3 steps to back deck  and then a small 1/2 step into home. Does the patient have any problems obtaining your medications?: No  Social/Family/Support Systems Patient Roles: Parent(Divorced; has 2 grown sons.) Contact Information: Tracey Stewart - son - [phone] Anticipated Caregiver: Sons and brother Ability/Limitations of Caregiver: Sons works; brother can see patient daily and assist. Caregiver Availability: Intermittent Discharge Plan Discussed with Primary Caregiver: Yes Is Caregiver In Agreement with Plan?: Yes Does Caregiver/Family have Issues with Lodging/Transportation while Pt is in Rehab?: No  Goals/Additional Needs Patient/Family Goal for Rehab: PT/OT supervision to min assist goals Expected length of stay: 12-16 days Cultural Considerations: None Dietary Needs: Carb mod, med cal, thin liquids Equipment Needs: TBD Pt/Family Agrees to Admission and willing to participate: Yes Program Orientation Provided & Reviewed with Pt/Caregiver Including Roles  & Responsibilities: Yes  Decrease burden of Care through IP rehab admission: N/A  Possible need for SNF placement upon discharge: Potentially  Patient Condition: This patient's medical and functional status has changed since the consult dated: 04/20/18 in which the Rehabilitation Physician determined and documented that the patient's condition is appropriate for intensive rehabilitative care in an inpatient rehabilitation facility. See "History of Present Illness" (above) for medical update. Functional changes are:  Currently requiring max/total assist for mobility and ADLs. Patient's medical and functional status update has been discussed  with the Rehabilitation physician and patient remains appropriate for inpatient rehabilitation. Will admit to inpatient rehab today.  Preadmission Screen Completed By:  Retta Diones, 04/24/2018 1:49 PM ______________________________________________________________________   Discussed status with Dr. Posey Pronto on 04/24/18 at 1349 and received telephone approval for admission today.  Admission Coordinator:  Retta Diones, time 1349/Date 04/24/18

## 2018-04-24 NOTE — Discharge Summary (Signed)
Physician Discharge Summary  Casey Gilmore OEU:[id number] DOB: 08/28/1941 DOA: 04/15/2018  PCP: Mayra Neer, MD  Admit date: 04/15/2018 Discharge date: 04/24/2018  Admitted From: home Disposition:  CIR  Recommendations for Outpatient Follow-up:  1. Follow up with PCP in 1-2 weeks 2. Monitor BMP/CBC  Home Health: none Equipment/Devices: none  Discharge Condition: stable CODE STATUS: Full code Diet recommendation: regular  HPI: Per Dr. 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 Vickey Huger has been seen and evaluated and had his wrist repaired at that time and was placed in a splint and returned home subsequent to that. He states that he fell out of bed 630 this morning and when he fell that he had a severe injury as he could not move his leg He presented to the emergency room for work-up    Hospital Course: Left hip fracture -Orthopedic surgery consulted and followed patient, he is status post operative repair on 5/14 by Dr. Marcelino Scot, he is doing well postop, pain seems to be improving. He will continue his rehabilitation in CIR setting. Continue pain control and DVT prophylaxis with Lovenox.  Chronic diastolic CHF -appears euvolemic, slight bump in creatinine so Lasix was held for 1 day. Resume Lasix and repeat BMP in 2-3 days COPD -stable, continue to monitor, no wheezing Diabetes mellitus, uncontrolled -continue home medications and sliding scale Iron and B12 deficiency anemia /component of acute blood loss anemia postoperatively -Hemoglobin  stable. Continue Iron and B12 History of AS, post bioprosthesis AVR 2012 -Echo reassuring    Discharge Diagnoses:  Principal Problem:   Closed left hip fracture ([place]) Active Problems:   Diabetes mellitus ([place])   Chronic diastolic heart failure ([facility])   Aortic valve replaced   COPD (chronic obstructive pulmonary disease) with chronic bronchitis ([place])   Anemia     Discharge Instructions   Allergies as of 04/24/2018   No Known Allergies     Medication List    STOP taking these medications   HYDROcodone-acetaminophen 10-325 MG tablet Commonly known as:  NORCO   HYDROcodone-acetaminophen 5-325 MG tablet Commonly known as:  NORCO/VICODIN   PRESCRIPTION MEDICATION     TAKE these medications   cyanocobalamin 2000 MCG tablet Take 4,000 mcg by mouth daily.   enoxaparin 40 MG/0.4ML injection Commonly known as:  LOVENOX Inject 0.4 mLs (40 mg total) into the skin daily. Start taking on:  04/25/2018   ferrous sulfate 325 (65 FE) MG tablet Take 1 tablet (325 mg total) by mouth 2 (two) times daily with a meal.   furosemide 40 MG tablet Commonly known as:  LASIX Take 40 mg by mouth daily.   glimepiride 4 MG tablet Commonly known as:  AMARYL Take 4 mg by mouth daily before breakfast.   metFORMIN 1000 MG tablet Commonly known as:  GLUCOPHAGE Take 1,000 mg by mouth 2 (two) times daily with a meal.   MULTIVITAMIN ADULTS Tabs Take 1 tablet by mouth daily.   omeprazole 40 MG capsule Commonly known as:  PRILOSEC Take 40 mg by mouth daily.   ondansetron 4 MG tablet Commonly  known as:  ZOFRAN Take 1 tablet (4 mg total) by mouth every 8 (eight) hours as needed for nausea or vomiting.   oxyCODONE 5 MG immediate release tablet Commonly known as:  Oxy IR/ROXICODONE Take 1-2 tablets (5-10 mg total) by mouth every 4 (four) hours as needed for moderate pain.   pravastatin 20 MG tablet Commonly known as:  PRAVACHOL Take 20 mg by mouth daily.   Vitamin D (Ergocalciferol)  50000 units Caps capsule Commonly known as:  DRISDOL TK 1 C PO 2 TIMES A WK FOR 6 MONTHS       Contact information for follow-up providers    Altamese La Sal, MD. Schedule an appointment as soon as possible for a visit in 2 week(s).   Specialty:  Orthopedic Surgery Contact information: [address] [place] [address] [phone]            Contact information for after-discharge care    Destination    [facility] .   Service:  Skilled Nursing Contact information: [address] [place] [phone]                  Consultations:  Orthopedic surgery   Procedures/Studies:  2D echo:  Study Conclusions - Left ventricle: The cavity size was normal. Wall thickness wasincreased in a pattern of mild LVH. Systolic function was normal.The estimated ejection fraction was in the range of 60% to 65%. - Aortic valve: Bioprosthetic valve with acceptable gradients DVI.5 suggesting normal function and no significant peri valvularregurgiation. Valve area (VTI): 0.88 cm^2. Valve area (Vmax):0.77 cm^2. Valve area (Vmean): 0.73 cm^2. - Mitral valve: Valve area by continuity equation (using LVOTflow): 0.83 cm^2. - Atrial septum: No defect or patent foramen ovale was identified.   ORIF L femur 5/14     Dg Chest 1 View  Result Date: 04/15/2018 CLINICAL DATA:  Status post fall today EXAM: CHEST  1 VIEW COMPARISON:  December 29, 2012 FINDINGS: The heart size and mediastinal contours are within normal limits. Both lungs are clear. The visualized skeletal structures are unremarkable. IMPRESSION: No active cardiopulmonary disease. Electronically Signed   By: Abelardo Diesel M.D.   On: 04/15/2018 12:19   Ct Hip Left Wo Contrast  Result Date: 04/16/2018 CLINICAL DATA:  Patient status post fall out of bed 04/15/2018. History of prior fusion of the left hip joint. Initial encounter. EXAM: CT OF THE LEFT HIP WITHOUT  CONTRAST TECHNIQUE: Multidetector CT imaging of the left hip was performed according to the standard protocol. Multiplanar CT image reconstructions were also generated. COMPARISON:  Plain films left hip 04/15/2018. FINDINGS: Bones/Joint/Cartilage There is solid fusion of the left hip joint with an intramedullary fusion device is in place. The patient has a fracture of the left femoral neck extending into the subtrochanteric femur. The femoral neck and intertrochanteric components of the fracture are nondisplaced. At the subtrochanteric femur, the femoral shaft is laterally displaced 1.2 cm and medially angulated approximately 30 degrees. The patient's fusion device is broken 2.2 cm from its tip at the site of the subtrochanteric component of the patient's fracture. No other fracture is identified. Bones appear osteopenic. 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 Ligaments Suboptimally assessed by CT. Muscles and Tendons There is fatty atrophy of musculature about the left hip, particularly the gluteal musculature. Imaged intrapelvic contents are unremarkable. Soft tissues Soft tissue contusion about the patient's fractures identified. IMPRESSION: Acute femoral neck fracture extending  into the inter and subtrochanteric femur of the left hip as described above. The left hip is solidly fused with a fixation device in place. The device is fractured 2.2 cm from its tip at the level of the subtrochanteric component of patient's subtrochanteric fracture. No other acute abnormality Electronically Signed   By: Inge Rise M.D.   On: 04/16/2018 15:07   Dg Knee Left Port  Result Date: 04/17/2018 CLINICAL DATA:  Status post fall 2 days ago.  Left hip injury. EXAM: PORTABLE LEFT KNEE - 1-2 VIEW COMPARISON:  None. FINDINGS: The left knee demonstrates a total knee arthroplasty without evidence of hardware failure complication. There is no fracture or dislocation. The  alignment is anatomic. There is generalized osteopenia. There is a small joint effusion. IMPRESSION: No acute osseous injury of the left knee. Electronically Signed   By: Kathreen Devoid   On: 04/17/2018 12:38   Dg C-arm 1-60 Min  Result Date: 04/18/2018 CLINICAL DATA:  Open reduction and internal fixation of left femur. EXAM: DG C-ARM 61-120 MIN; LEFT FEMUR 2 VIEWS FLUOROSCOPY TIME:  1 minutes 40 seconds. COMPARISON:  Radiographs of Apr 15, 2018. FINDINGS: Nine intraoperative fluoroscopic images were obtained of the left hip. These images demonstrate surgical internal fixation of proximal left femoral fracture. Improved alignment of fracture components is noted. IMPRESSION: Status post surgical internal fixation of proximal left femoral shaft fracture. Electronically Signed   By: Marijo Conception, M.D.   On: 04/18/2018 13:16   Dg C-arm 1-60 Min  Result Date: 04/18/2018 CLINICAL DATA:  Open reduction and internal fixation of left femur. EXAM: DG C-ARM 61-120 MIN; LEFT FEMUR 2 VIEWS FLUOROSCOPY TIME:  1 minutes 40 seconds. COMPARISON:  Radiographs of Apr 15, 2018. FINDINGS: Nine intraoperative fluoroscopic images were obtained of the left hip. These images demonstrate surgical internal fixation of proximal left femoral fracture. Improved alignment of fracture components is noted. IMPRESSION: Status post surgical internal fixation of proximal left femoral shaft fracture. Electronically Signed   By: Marijo Conception, M.D.   On: 04/18/2018 13:16   Dg C-arm 1-60 Min  Result Date: 04/18/2018 CLINICAL DATA:  Open reduction and internal fixation of left femur. EXAM: DG C-ARM 61-120 MIN; LEFT FEMUR 2 VIEWS FLUOROSCOPY TIME:  1 minutes 40 seconds. COMPARISON:  Radiographs of Apr 15, 2018. FINDINGS: Nine intraoperative fluoroscopic images were obtained of the left hip. These images demonstrate surgical internal fixation of proximal left femoral fracture. Improved alignment of fracture components is noted. IMPRESSION:  Status post surgical internal fixation of proximal left femoral shaft fracture. Electronically Signed   By: Marijo Conception, M.D.   On: 04/18/2018 13:16   Dg Hip Unilat With Pelvis 2-3 Views Left  Result Date: 04/18/2018 CLINICAL DATA:  Hip surgery. EXAM: DG HIP (WITH OR WITHOUT PELVIS) 2-3V LEFT COMPARISON:  04/15/2018. FINDINGS: ORIF left hip/proximal femur. Near anatomic alignment. Some degree of callus formation is noted. Fracture lucency remains. Prior total right hip replacement. IMPRESSION: 1. ORIF left hip/proximal femur. Near anatomic alignment. Some degree of callus formation is noted. 2.  Prior total right hip replacement. Electronically Signed   By: Marcello Moores  Register   On: 04/18/2018 15:52   Dg Hip Unilat With Pelvis 2-3 Views Left  Result Date: 04/15/2018 CLINICAL DATA:  Status post fall with hip pain. EXAM: DG HIP (WITH OR WITHOUT PELVIS) 2-3V LEFT COMPARISON:  January 15, 2018 FINDINGS: There is displaced fracture of the left proximal femoral shaft with fracture extending through the metallic hardware  of the proximal shaft. Total right hip replacement is identified. IMPRESSION: Displaced fracture of the left proximal femoral shaft with fracture extending through the metallic overlying hardware of the proximal shaft. Electronically Signed   By: Abelardo Diesel M.D.   On: 04/15/2018 12:19   Dg Femur Min 2 Views Left  Result Date: 04/18/2018 CLINICAL DATA:  Open reduction and internal fixation of left femur. EXAM: DG C-ARM 61-120 MIN; LEFT FEMUR 2 VIEWS FLUOROSCOPY TIME:  1 minutes 40 seconds. COMPARISON:  Radiographs of Apr 15, 2018. FINDINGS: Nine intraoperative fluoroscopic images were obtained of the left hip. These images demonstrate surgical internal fixation of proximal left femoral fracture. Improved alignment of fracture components is noted. IMPRESSION: Status post surgical internal fixation of proximal left femoral shaft fracture. Electronically Signed   By: Marijo Conception, M.D.    On: 04/18/2018 13:16     Subjective: - no chest pain, shortness of breath, no abdominal pain, nausea or vomiting.   Discharge Exam: Vitals:   04/23/18 1943 04/24/18 0408  BP: (!) 104/59 133/69  Pulse: 84 81  Resp: 16 16  Temp:  98 F (36.7 C)  SpO2: 93% 96%    General: Pt is alert, awake, not in acute distress Cardiovascular: RRR Respiratory: CTA bilaterally, no wheezing, no rhonchi Abdominal: Soft, NT, ND, bowel sounds +    The results of significant diagnostics from this hospitalization (including imaging, microbiology, ancillary and laboratory) are listed below for reference.     Microbiology: Recent Results (from the past 240 hour(s))  Surgical pcr screen     Status: None   Collection Time: 04/16/18 11:06 PM  Result Value Ref Range Status   MRSA, PCR NEGATIVE NEGATIVE Final   Staphylococcus aureus NEGATIVE NEGATIVE Final    Comment: (NOTE) The Xpert SA Assay (FDA approved for NASAL specimens in patients 52 years of age and older), is one component of a comprehensive surveillance program. It is not intended to diagnose infection nor to guide or monitor treatment. Performed at [facility], [place] [address], [place], [place] [address]      Labs: BNP (last 3 results) No results for input(s): BNP in the last 8760 hours. Basic Metabolic Panel: Recent Labs  Lab 04/18/18 1803 04/19/18 0309 04/20/18 0339 04/21/18 0357 04/23/18 0420  NA  --  136 135 137 137  K  --  4.6 4.2 4.2 4.1  CL  --  103 99* 99* 95*  CO2  --  27 28 29  32  GLUCOSE  --  164* 165* 181* 181*  BUN  --  15 15 15  21*  CREATININE 1.12 1.14 1.15 1.18 1.25*  CALCIUM  --  8.3* 8.3* 8.9 9.0   Liver Function Tests: No results for input(s): AST, ALT, ALKPHOS, BILITOT, PROT, ALBUMIN in the last 168 hours. No results for input(s): LIPASE, AMYLASE in the last 168 hours. No results for input(s): AMMONIA in the last 168 hours. CBC: Recent Labs  Lab 04/19/18 0309 04/19/18 1415 04/20/18 0339  04/21/18 0357 04/23/18 0420  WBC 7.2 7.0 7.4 7.4 6.4  HGB 8.3* 8.7* 8.2* 8.9* 9.4*  HCT 27.9* 28.4* 27.4* 29.5* 31.4*  MCV 79.5 80.7 79.9 80.4 78.9  PLT 241 217 232 241 266   Cardiac Enzymes: No results for input(s): CKTOTAL, CKMB, CKMBINDEX, TROPONINI in the last 168 hours. BNP: Invalid input(s): POCBNP CBG: Recent Labs  Lab 04/23/18 1144 04/23/18 1657 04/23/18 2118 04/24/18 0601 04/24/18 1245  GLUCAP 186* 169* 192* 183* 159*  D-Dimer No results for input(s): DDIMER in the last 72 hours. Hgb A1c No results for input(s): HGBA1C in the last 72 hours. Lipid Profile No results for input(s): CHOL, HDL, LDLCALC, TRIG, CHOLHDL, LDLDIRECT in the last 72 hours. Thyroid function studies No results for input(s): TSH, T4TOTAL, T3FREE, THYROIDAB in the last 72 hours.  Invalid input(s): FREET3 Anemia work up No results for input(s): VITAMINB12, FOLATE, FERRITIN, TIBC, IRON, RETICCTPCT in the last 72 hours. Urinalysis    Component Value Date/Time   COLORURINE YELLOW 10/12/2011 1353   APPEARANCEUR CLEAR 10/12/2011 1353   LABSPEC 1.026 10/12/2011 1353   PHURINE 5.5 10/12/2011 1353   GLUCOSEU NEGATIVE 10/12/2011 1353   HGBUR NEGATIVE 10/12/2011 1353   BILIRUBINUR NEGATIVE 10/12/2011 1353   KETONESUR NEGATIVE 10/12/2011 1353   PROTEINUR NEGATIVE 10/12/2011 1353   UROBILINOGEN 1.0 10/12/2011 1353   NITRITE NEGATIVE 10/12/2011 1353   LEUKOCYTESUR NEGATIVE 10/12/2011 1353   Sepsis Labs Invalid input(s): PROCALCITONIN,  WBC,  LACTICIDVEN   Time coordinating discharge: 40 minutes  SIGNED:  Marzetta Board, MD  [facility] 04/24/2018, 1:51 PM Pager [phone]  If 7PM-7AM, please contact night-coverage [email or web address] Password [email or web address]

## 2018-04-24 NOTE — Progress Notes (Signed)
Received pt. As a new admission.Pt. Was oriented to the unit. Safety plan was explained,fall prevention plan was explained and signed.

## 2018-04-24 NOTE — H&P (Signed)
Physical Medicine and Rehabilitation Admission H&P    Chief Complaint  Patient presents with  . Fall  . Hip Pain  : HPI: Casey Gilmore is a 77 year old right-handed male with history of COPD, left hip fusion as a youth, diabetes mellitus, AVR, esophageal cancer and chronic anemia as well as recent fall with left humeral and radial ulnar fractures.. Per chart review and patient, patient lives alone.  Independent with assistive device.  One level home with 4 steps to entry.  Patient has a son and a brother who take him to the necessary appointments.  Family planning on assistance as needed.  Presented 04/15/2018 after a fall out of bed without loss of consciousness and complains of left hip pain.  X-rays and imaging revealed complex left subtrochanteric hip fracture.  Patient with chronic shortness of breath cardiology services consulted for surgical clearance.  Echocardiogram showed ejection fraction 65% with mild LVH normal function of bioprosthetic AV.  Underwent removal of left femur/hip fusion hardware with ORIF left proximal femur fracture by Dr. Marcelino Scot 04/18/2018.  Touchdown weightbearing left lower extremity with unrestricted movement of ankle and knee.  Hospital course pain management.  Subcutaneous Lovenox for DVT prophylaxis.  Hemoglobin remained stable at 9.1.  Physical and occupational therapy evaluations completed patient was admitted for a comprehensive rehab program.  Review of Systems  Constitutional: Negative for chills and fever.  HENT: Positive for hearing loss.   Eyes: Negative for blurred vision and double vision.  Respiratory: Negative for cough.   Gastrointestinal: Positive for constipation and heartburn. Negative for nausea.       GERD  Genitourinary: Negative for hematuria.  Musculoskeletal: Positive for joint pain.  Skin: Negative for rash.  Neurological: Positive for weakness.  Psychiatric/Behavioral: Positive for depression.  All other systems reviewed and  are negative.  Past Medical History:  Diagnosis Date  . Anemia    2012  . Arthritis   . Blood transfusion   . 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 Closed fracture of left distal radius and ulna 01/19/2018  . Closed fracture of left proximal humerus 01/19/2018  . COPD (chronic obstructive pulmonary disease) ([place])   . Diabetes mellitus   . GERD (gastroesophageal reflux disease)   . Heart murmur   . Hiatal hernia   . Hyperlipidemia   . Hypertension   . NSTEMI (non-ST elevated myocardial infarction) ([place]) 07/2011   in setting of Pneumonia with normal coronary arteries on cath  . Pneumonia hx 8/12  . Severe aortic stenosis 2012   s/p bioprosthetic AVR  . Shortness of breath    with exertion  . Upper GI bleeding    Past Surgical History:  Procedure Laterality Date  . AORTIC VALVE REPLACEMENT  10/15/2011   Procedure: AORTIC VALVE REPLACEMENT (AVR);  Surgeon: Tharon Aquas Adelene Idler, MD;  Location: [place];  Service: Open Heart Surgery;  Laterality: N/A;  . CARDIAC CATHETERIZATION  results on chart   2012 w no significant disease,echo with Severe Aortic stenosis,LVH,Diastolic dysfunction.   . COLONOSCOPY  12/2004  . EYE SURGERY  bil cat 08  . FRACTURE SURGERY  pin in lft hip  . JOINT REPLACEMENT  left knee 08, rt hip 08  . LARYNGOSCOPY  01/05/2013   Procedure: LARYNGOSCOPY;  Surgeon: Melida Quitter, MD;  Location: [place];  Service: ENT;  Laterality: N/A;  micro direct laryngoscopy with biopsy, possible co2 laser, small laser-safe endotracheal tube  . LARYNGOSCOPY N/A 02/08/2013   Procedure: LARYNGOSCOPY;  Surgeon: Melida Quitter, MD;  Location: [place];  Service: ENT;  Laterality: N/A;  Micro Direct Laryngoscopy with Co2 laser with re-excision right vocal cord lesion.  . OPEN REDUCTION INTERNAL FIXATION (ORIF) DISTAL RADIAL FRACTURE Left 01/19/2018   Procedure: OPEN REDUCTION INTERNAL FIXATION (ORIF) DISTAL RADIAL FRACTURE;  Surgeon: Marchia Bond, MD;  Location: [place];  Service: Orthopedics;  Laterality:  Left;  . ORIF FEMUR FRACTURE Left 04/18/2018   Procedure: OPEN REDUCTION INTERNAL FIXATION PROXIMAL FEMORAL SHAFT FRACTURE...REMOVAL OF HARDWARE LEFT FEMUR (HIP FUSION PLATE);  Surgeon: Altamese Brandenburg, MD;  Location: [place];  Service: Orthopedics;  Laterality: Left;  . ORIF HUMERUS FRACTURE Left 01/19/2018   Procedure: OPEN REDUCTION INTERNAL FIXATION (ORIF) PROXIMAL HUMERUS FRACTURE;  Surgeon: Marchia Bond, MD;  Location: [place];  Service: Orthopedics;  Laterality: Left;  . TONSILLECTOMY    . TOTAL HIP ARTHROPLASTY Right   . TOTAL KNEE ARTHROPLASTY Left   . 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 No pertinent family history. Social History:  reports that he quit smoking about 39 years ago. His smoking use included cigarettes. He has a 50.00 pack-year smoking history. He has never used smokeless tobacco. He reports that he does not drink alcohol or use drugs. Allergies: No Known Allergies Medications Prior to Admission  Medication Sig Dispense Refill  . cyanocobalamin 2000 MCG tablet Take 4,000 mcg by mouth daily.     . furosemide (LASIX) 40 MG tablet Take 40 mg by mouth daily.     Marland Kitchen glimepiride (AMARYL) 4 MG tablet Take 4 mg by mouth daily before breakfast.     . HYDROcodone-acetaminophen (NORCO/VICODIN) 5-325 MG tablet TK 1 T PO TID PRF PAIN  0  . metFORMIN (GLUCOPHAGE) 1000 MG tablet Take 1,000 mg by mouth 2 (two) times daily with a meal.    . Multiple Vitamins-Minerals (MULTIVITAMIN ADULTS) TABS Take 1 tablet by mouth daily.    Marland Kitchen omeprazole (PRILOSEC) 40 MG capsule Take 40 mg by mouth daily.    . ondansetron (ZOFRAN) 4 MG tablet Take 1 tablet (4 mg total) by mouth every 8 (eight) hours as needed for nausea or vomiting. 10 tablet 0  . pravastatin (PRAVACHOL) 20 MG tablet Take 20 mg by mouth daily.  5  . PRESCRIPTION MEDICATION Inject 40 Units into the skin daily. Insulin Doctor is giving him samples of    . Vitamin  D, Ergocalciferol, (DRISDOL) 50000 units CAPS capsule TK 1 C PO 2 TIMES A WK FOR 6 MONTHS  6  . HYDROcodone-acetaminophen (NORCO) 10-325 MG tablet Take 1 tablet by mouth every 6 (six) hours as needed. (Patient not taking: Reported on 04/15/2018) 30 tablet 0    Drug Regimen Review Drug regimen was reviewed and remains appropriate with no significant issues identified  Home: Home Living Family/patient expects to be discharged to:: Private residence Living Arrangements: Alone Available Help at Discharge: Family, Available PRN/intermittently Type of Home: House Home Access: Stairs to enter Technical brewer of Steps: 4 Entrance Stairs-Rails: Left, Right Home Layout: One level Bathroom Shower/Tub: Multimedia programmer: Handicapped height Home Equipment: Elmo - single point   Functional History: Prior Function Level of Independence: Independent with assistive device(s)  Functional Status:  Mobility: Bed Mobility Overal bed mobility: Needs Assistance Bed Mobility: Supine to Sit, Sit to Supine Supine to sit: Max assist, +2 for physical assistance Sit to supine: Total assist, +2 for physical assistance General bed mobility comments: Pt able to  initiate movement and attempt bridging to transition to EOB but requiring max assist +2 to complete sit<>supine. Difficulty achieving fully upright position secondary to pain and decreased L hip flexion.  Transfers General transfer comment: unable Ambulation/Gait General Gait Details: unable    ADL: ADL Overall ADL's : Needs assistance/impaired Eating/Feeding: Minimal assistance, Bed level Grooming: Minimal assistance, Bed level Upper Body Bathing: Maximal assistance, Bed level Lower Body Bathing: Total assistance, Bed level Upper Body Dressing : Maximal assistance, Bed level Lower Body Dressing: Total assistance, Bed level General ADL Comments: Pt able to sit at EOB but unable to achieve midline positioning due to pain and  limited L knee ROM.   Cognition: Cognition Overall Cognitive Status: No family/caregiver present to determine baseline cognitive functioning Orientation Level: Oriented to person Cognition Arousal/Alertness: Awake/alert Behavior During Therapy: WFL for tasks assessed/performed Overall Cognitive Status: No family/caregiver present to determine baseline cognitive functioning Area of Impairment: Memory, Following commands, Awareness, Problem solving Memory: Decreased short-term memory Following Commands: Follows multi-step commands inconsistently Awareness: Emergent Problem Solving: Slow processing, Difficulty sequencing General Comments: Pt with difficulty reporting full understanding of his current situation.   Physical Exam: Blood pressure 133/69, pulse 81, temperature 98 F (36.7 C), temperature source Oral, resp. rate 16, height 6' (1.829 m), weight 81.3 kg (179 lb 3.7 oz), SpO2 96 %. Physical Exam  Vitals reviewed. Constitutional: He is oriented to person, place, and time. He appears well-developed and well-nourished.  HENT:  Head: Normocephalic and atraumatic.  Eyes: EOM are normal. Right eye exhibits no discharge. Left eye exhibits no discharge.  Neck: Normal range of motion. Neck supple. No thyromegaly present.  Cardiovascular: Normal rate and regular rhythm.  Respiratory: Effort normal and breath sounds normal. No respiratory distress.  GI: Soft. Bowel sounds are normal. He exhibits no distension.  Musculoskeletal:  Left hip edema and tenderness  Neurological: He is alert and oriented to person, place, and time.  Speech is mildly dysarthric but fully intelligible Motor: Bilateral upper extremities: 4+/5 proximal to distal Right lower extremity: Hip flexion, knee extension 2/5, ankle dorsiflexion 4+/5 Left lower extremity: Hip flexion, knee extension 1+/5, ankle dorsiflexion 4+/5  Skin: Skin is warm and dry.  Surgical site with dressing C/D/I  Psychiatric: He has a normal  mood and affect. His behavior is normal.   Results for orders placed or performed during the hospital encounter of 04/15/18 (from the past 48 hour(s))  Glucose, capillary     Status: Abnormal   Collection Time: 04/22/18  4:38 PM  Result Value Ref Range   Glucose-Capillary 202 (H) 65 - 99 mg/dL  Glucose, capillary     Status: Abnormal   Collection Time: 04/22/18 10:01 PM  Result Value Ref Range   Glucose-Capillary 163 (H) 65 - 99 mg/dL   Comment 1 Document in Chart   CBC     Status: Abnormal   Collection Time: 04/23/18  4:20 AM  Result Value Ref Range   WBC 6.4 4.0 - 10.5 K/uL   RBC 3.98 (L) 4.22 - 5.81 MIL/uL   Hemoglobin 9.4 (L) 13.0 - 17.0 g/dL   HCT 31.4 (L) 39.0 - 52.0 %   MCV 78.9 78.0 - 100.0 fL   MCH 23.6 (L) 26.0 - 34.0 pg   MCHC 29.9 (L) 30.0 - 36.0 g/dL   RDW 18.9 (H) 11.5 - 15.5 %   Platelets 266 150 - 400 K/uL    Comment: Performed at [facility], [address], [place], [place] [address]  Basic  metabolic panel     Status: Abnormal   Collection Time: 04/23/18  4:20 AM  Result Value Ref Range   Sodium 137 135 - 145 mmol/L   Potassium 4.1 3.5 - 5.1 mmol/L   Chloride 95 (L) 101 - 111 mmol/L   CO2 32 22 - 32 mmol/L   Glucose, Bld 181 (H) 65 - 99 mg/dL   BUN 21 (H) 6 - 20 mg/dL   Creatinine, Ser 1.25 (H) 0.61 - 1.24 mg/dL   Calcium 9.0 8.9 - 10.3 mg/dL   GFR calc non Af Amer 54 (L) >60 mL/min   GFR calc Af Amer >60 >60 mL/min    Comment: (NOTE) The eGFR has been calculated using the CKD EPI equation. This calculation has not been validated in all clinical situations. eGFR's persistently <60 mL/min signify possible Chronic Kidney Disease.    Anion gap 10 5 - 15    Comment: Performed at [facility] [address] [place], [place] [address]  Glucose, capillary     Status: Abnormal   Collection Time: 04/23/18  6:20 AM  Result Value Ref Range   Glucose-Capillary 193 (H) 65 - 99 mg/dL  Glucose, capillary     Status: Abnormal   Collection Time:  04/23/18 11:44 AM  Result Value Ref Range   Glucose-Capillary 186 (H) 65 - 99 mg/dL  Glucose, capillary     Status: Abnormal   Collection Time: 04/23/18  4:57 PM  Result Value Ref Range   Glucose-Capillary 169 (H) 65 - 99 mg/dL  Glucose, capillary     Status: Abnormal   Collection Time: 04/23/18  9:18 PM  Result Value Ref Range   Glucose-Capillary 192 (H) 65 - 99 mg/dL  Glucose, capillary     Status: Abnormal   Collection Time: 04/24/18  6:01 AM  Result Value Ref Range   Glucose-Capillary 183 (H) 65 - 99 mg/dL   No results found.     Medical Problem List and Plan: 1.  Decreased functional mobility secondary to left subtrochanteric femur fracture, retained hip fusion hardware placed in the 1950s and broken hardware status post ORIF 04/18/2018.  Touchdown weightbearing x8 weeks 2.  DVT Prophylaxis/Anticoagulation: Subcutaneous Lovenox.  Check vascular study 3. Pain Management: Oxycodone as needed 4. Mood: Provide emotional support 5. Neuropsych: This patient is capable of making decisions on his own behalf. 6. Skin/Wound Care: Routine skin checks 7. Fluids/Electrolytes/Nutrition: Routine in and outs with follow-up chemistries 8.  Acute on chronic anemia.  Follow-up CBC.  Continue iron supplement 9.  Recent fall with left humeral and radial/ulnar fractures. 10.  Type 2 diabetes mellitus.  Hemoglobin A1c 7.6.  SSI.  Check blood sugars before meals and at bedtime 11.  Hyperlipidemia.  Pravachol 12.  History of severe aortic stenosis.  Status post bioprosthetic AVR 2012. 13.  History of GI bleed.  Continue Protonix  Post Admission Physician Evaluation: 1. Preadmission assessment reviewed and changes made below. 2. Functional deficits secondary  to left hip fracture. 3. Patient is admitted to receive collaborative, interdisciplinary care between the physiatrist, rehab nursing staff, and therapy team. 4. Patient's level of medical complexity and substantial therapy needs in context of  that medical necessity cannot be provided at a lesser intensity of care such as a SNF. 5. Patient has experienced substantial functional loss from his/her baseline which was documented above under the "Functional History" and "Functional Status" headings.  Judging by the patient's diagnosis, physical exam, and functional history, the patient has potential for functional  progress which will result in measurable gains while on inpatient rehab.  These gains will be of substantial and practical use upon discharge  in facilitating mobility and self-care at the household level. 6. Physiatrist will provide 24 hour management of medical needs as well as oversight of the therapy plan/treatment and provide guidance as appropriate regarding the interaction of the two. 7. 24 hour rehab nursing will assist with safety, skin/wound care, disease management, pain management and patient education  and help integrate therapy concepts, techniques,education, etc. 8. PT will assess and treat for/with: Lower extremity strength, range of motion, stamina, balance, functional mobility, safety, adaptive techniques and equipment, wound care, coping skills, pain control, education. Goals are: Min A. 9. OT will assess and treat for/with: ADL's, functional mobility, safety, upper extremity strength, adaptive techniques and equipment, wound mgt, ego support, and community reintegration.   Goals are: Min A. Therapy may proceed with showering this patient. 10. Case Management and Social Worker will assess and treat for psychological issues and discharge planning. 11. Team conference will be held weekly to assess progress toward goals and to determine barriers to discharge. 12. Patient will receive at least 3 hours of therapy per day at least 5 days per week. 13. ELOS: 12-16 days.       14. Prognosis:  excellent and good  I have personally performed a face to face diagnostic evaluation, including, but not limited to relevant history and  physical exam findings, of this patient and developed relevant assessment and plan.  Additionally, I have reviewed and concur with the physician assistant's documentation above.  Delice Lesch, MD, ABPMR Lavon Paganini Angiulli, PA-C 04/24/2018

## 2018-04-24 NOTE — Progress Notes (Signed)
Report was given to Maudry Mayhew, Therapist, sports.  Patient is ready to be transported to 4 MW #4 via bed.

## 2018-04-24 NOTE — Care Management Important Message (Signed)
Important Message  Patient Details  Name: Casey Gilmore MRN: [id number] Date of Birth: 01-Dec-1941   Medicare Important Message Given:  Yes    Erenest Rasher, RN 04/24/2018, 11:36 AM

## 2018-04-24 NOTE — Progress Notes (Signed)
Rehab admissions - I spoke with insurance case Freight forwarder at [employer].  She is currently working on our request for inpatient rehab admission.  We should hear back from the medical director hopefully later this afternoon.  We do have beds available today if we get insurance approval.  Call me for questions.  #[phone]

## 2018-04-24 NOTE — IPOC Note (Signed)
Overall Plan of Care [facility]) Patient Details Name: Casey Gilmore MRN: [id number] DOB: 07/28/1941  Admitting Diagnosis: Left hip fracture  Hospital Problems: Active Problems:   Closed left subtrochanteric femur fracture (HCC)   Acute blood loss anemia   Anemia of chronic disease   Diabetes mellitus type 2 in nonobese [facility])   Hyperlipidemia   Aortic valve stenosis   History of GI bleed   Hypoalbuminemia due to protein-calorie malnutrition (HCC)     Functional Problem List: Nursing Bowel, Endurance, Motor, Pain, Safety, Skin Integrity  PT Balance, Behavior, Endurance, Pain, Safety  OT Balance, Endurance, Motor, Pain, Safety  SLP    TR         Basic ADL's: OT Grooming, Bathing, Dressing, Toileting     Advanced  ADL's: OT Simple Meal Preparation     Transfers: PT Bed Mobility, Bed to Chair, Car, Furniture, Floor  OT Toilet, Tub/Shower     Locomotion: PT Ambulation, Emergency planning/management officer, Stairs     Additional Impairments: OT None  SLP        TR      Anticipated Outcomes Item Anticipated Outcome  Self Feeding    Swallowing      Basic self-care  Min assist - supervision  Toileting  Min assist   Bathroom Transfers Min assist  Bowel/Bladder  Continent to B&B with min. assist.  Transfers  Min assist  Locomotion  Supervision w/c level  Communication     Cognition     Pain  Less than 3,on 1 to 10 scale.  Safety/Judgment  Free from falls during his stay in rehab.   Therapy Plan: PT Intensity: Minimum of 1-2 x/day ,45 to 90 minutes PT Frequency: 5 out of 7 days PT Duration Estimated Length of Stay: 14-18 days OT Intensity: Minimum of 1-2 x/day, 45 to 90 minutes OT Frequency: 5 out of 7 days OT Duration/Estimated Length of Stay: 14-18 days      Team Interventions: Nursing Interventions Patient/Family Education, Pain Management, Discharge Planning, Skin Care/Wound Management  PT interventions Visual/perceptual remediation/compensation, Stair  training, Pain management, Disease management/prevention, Ambulation/gait training, Training and development officer, DME/adaptive equipment instruction, Patient/family education, Therapeutic Activities, Wheelchair propulsion/positioning, Therapeutic Exercise, Psychosocial support, Cognitive remediation/compensation, Community reintegration, Skin care/wound management, UE/LE Strength taining/ROM, Functional mobility training, Discharge planning, Neuromuscular re-education, Splinting/orthotics, UE/LE Coordination activities  OT Interventions Balance/vestibular training, Discharge planning, Disease mangement/prevention, DME/adaptive equipment instruction, Functional mobility training, Pain management, Patient/family education, Psychosocial support, Self Care/advanced ADL retraining, Skin care/wound managment, Therapeutic Activities, Therapeutic Exercise, UE/LE Strength taining/ROM, UE/LE Coordination activities, Wheelchair propulsion/positioning  SLP Interventions    TR Interventions    SW/CM Interventions Discharge Planning, Psychosocial Support, Patient/Family Education   Barriers to Discharge MD  Medical stability  Nursing      PT Inaccessible home environment, Decreased caregiver support, Lack of/limited family support, Weight bearing restrictions Caregivers able to assist intermittently on a daily basis (son and brother), 4 steps to enter home  OT Decreased caregiver support, Weight bearing restrictions    SLP      SW       Team Discharge Planning: Destination: PT-Home(Home vs SNF if pt's family unable to assist) ,OT- Home , SLP-  Projected Follow-up: PT-None, OT-  Home health OT, 24 hour supervision/assistance, SLP-  Projected Equipment Needs: PT-To be determined, OT- 3 in 1 bedside comode, Tub/shower bench, SLP-  Equipment Details: PT- , OT-  Patient/family involved in discharge planning: PT- Patient,  OT-Patient, SLP-   MD ELOS: 14-18 days. Medical Rehab Prognosis:  Good  Assessment:  77 year old right-handed male with history of COPD, left hip fusion as a youth, diabetes mellitus, AVR, esophageal cancer and chronic anemia as well as recent fall with left humeral and radial ulnar fractures. Presented 04/15/2018 after a fall out of bed without loss of consciousness and complains of left hip pain.  X-rays and imaging revealed complex left subtrochanteric hip fracture.  Patient with chronic shortness of breath cardiology services consulted for surgical clearance.  Echocardiogram showed ejection fraction 65% with mild LVH normal function of bioprosthetic AV.  Underwent removal of left femur/hip fusion hardware with ORIF left proximal femur fracture by Dr. Marcelino Scot 04/18/2018.  Touchdown weightbearing left lower extremity with unrestricted movement of ankle and knee.  Hospital course pain management as well as acute blood loss anemia. Patient with resultant functional deficits with mobility, transfers, self-care. Will set goals for Min A with PT/OT.  See Team Conference Notes for weekly updates to the plan of care

## 2018-04-25 ENCOUNTER — Inpatient Hospital Stay (HOSPITAL_COMMUNITY): Payer: Medicare Other | Admitting: Physical Therapy

## 2018-04-25 ENCOUNTER — Inpatient Hospital Stay (HOSPITAL_COMMUNITY): Payer: Medicare Other | Admitting: Occupational Therapy

## 2018-04-25 ENCOUNTER — Inpatient Hospital Stay (HOSPITAL_COMMUNITY): Payer: Medicare Other

## 2018-04-25 DIAGNOSIS — E8809 Other disorders of plasma-protein metabolism, not elsewhere classified: Secondary | ICD-10-CM

## 2018-04-25 DIAGNOSIS — E46 Unspecified protein-calorie malnutrition: Secondary | ICD-10-CM

## 2018-04-25 DIAGNOSIS — M7989 Other specified soft tissue disorders: Secondary | ICD-10-CM

## 2018-04-25 LAB — COMPREHENSIVE METABOLIC PANEL
ALK PHOS: 116 U/L (ref 38–126)
ALT: 6 U/L — ABNORMAL LOW (ref 17–63)
AST: 22 U/L (ref 15–41)
Albumin: 2.6 g/dL — ABNORMAL LOW (ref 3.5–5.0)
Anion gap: 9 (ref 5–15)
BILIRUBIN TOTAL: 0.8 mg/dL (ref 0.3–1.2)
BUN: 20 mg/dL (ref 6–20)
CALCIUM: 8.9 mg/dL (ref 8.9–10.3)
CO2: 31 mmol/L (ref 22–32)
Chloride: 99 mmol/L — ABNORMAL LOW (ref 101–111)
Creatinine, Ser: 1.16 mg/dL (ref 0.61–1.24)
GFR calc Af Amer: >60 mL/min (ref >60)
GFR, EST NON AFRICAN AMERICAN: 59 mL/min — AB (ref >60)
Glucose, Bld: 194 mg/dL — ABNORMAL HIGH (ref 65–99)
POTASSIUM: 4.2 mmol/L (ref 3.5–5.1)
Sodium: 139 mmol/L (ref 135–145)
TOTAL PROTEIN: 5.9 g/dL — AB (ref 6.5–8.1)

## 2018-04-25 LAB — CBC WITH DIFFERENTIAL/PLATELET
Abs Immature Granulocytes: 0.1 10*3/uL (ref 0.0–0.1)
Basophils Absolute: 0.1 10*3/uL (ref 0.0–0.1)
Basophils Relative: 1 %
EOS PCT: 4 %
Eosinophils Absolute: 0.3 10*3/uL (ref 0.0–0.7)
HEMATOCRIT: 31 % — AB (ref 39.0–52.0)
HEMOGLOBIN: 9.1 g/dL — AB (ref 13.0–17.0)
IMMATURE GRANULOCYTES: 1 %
LYMPHS ABS: 2 10*3/uL (ref 0.7–4.0)
LYMPHS PCT: 30 %
MCH: 23.8 pg — ABNORMAL LOW (ref 26.0–34.0)
MCHC: 29.4 g/dL — ABNORMAL LOW (ref 30.0–36.0)
MCV: 81.2 fL (ref 78.0–100.0)
MONOS PCT: 12 %
Monocytes Absolute: 0.8 10*3/uL (ref 0.1–1.0)
NEUTROS PCT: 52 %
Neutro Abs: 3.6 10*3/uL (ref 1.7–7.7)
Platelets: 289 10*3/uL (ref 150–400)
RBC: 3.82 MIL/uL — ABNORMAL LOW (ref 4.22–5.81)
RDW: 18.9 % — AB (ref 11.5–15.5)
WBC: 6.8 10*3/uL (ref 4.0–10.5)

## 2018-04-25 LAB — GLUCOSE, CAPILLARY
GLUCOSE-CAPILLARY: 224 mg/dL — AB (ref 65–99)
GLUCOSE-CAPILLARY: 204 mg/dL — AB (ref 65–99)
Glucose-Capillary: 187 mg/dL — ABNORMAL HIGH (ref 65–99)
Glucose-Capillary: 200 mg/dL — ABNORMAL HIGH (ref 65–99)

## 2018-04-25 MED ORDER — PRO-STAT SUGAR FREE PO LIQD
30.0000 mL | Freq: Two times a day (BID) | ORAL | Status: DC
  Administered 2018-04-25 – 2018-05-12 (×35): 30 mL via ORAL
  Filled 2018-04-25 (×35): qty 30

## 2018-04-25 NOTE — Evaluation (Signed)
Physical Therapy Assessment and Plan  Patient Details  Name: Casey Gilmore MRN: [id number] Date of Birth: Oct 16, 1941  PT Diagnosis: Contracture of joint: L hip, Difficulty walking, Muscle weakness and Pain in joint Rehab Potential: Fair ELOS: 14-18 days   Today's Date: 04/25/2018 PT Individual Time: 1300-1410 AND 1500-1525 AND 1425-1437 PT Individual Time Calculation (min): 70 min AND 25 min AND 12 min Missed time: 23 min (off unit-xray)  Problem List:  Patient Active Problem List   Diagnosis Date Noted  . Hypoalbuminemia due to protein-calorie malnutrition ([place])   . Closed left subtrochanteric femur fracture ([place]) 04/24/2018  . Acute blood loss anemia   . Anemia of chronic disease   . Diabetes mellitus type 2 in nonobese ([facility])   . Hyperlipidemia   . Aortic valve stenosis   . History of GI bleed   . Anemia 04/17/2018  . Closed left hip fracture ([place]) 04/15/2018  . Closed fracture of left proximal humerus 01/19/2018  . Closed fracture of left distal radius and ulna 01/19/2018  . 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 COPD (chronic obstructive pulmonary disease) with chronic bronchitis ([place]) 10/29/2014  . Chronic diastolic heart failure ([place]) 06/24/2014  . Benign essential HTN 06/24/2014  . 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 History of non-ST elevation myocardial infarction (NSTEMI) 06/24/2014  . Diabetes mellitus ([place]) 10/17/2011  . Osteoarthritis 10/17/2011    Past Medical History:  Past Medical History:  Diagnosis Date  . Anemia    2012  . Arthritis   . Blood transfusion   . 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 Closed fracture of left distal radius and ulna 01/19/2018  . Closed fracture of left proximal humerus 01/19/2018  . COPD (chronic obstructive pulmonary disease) ([place])   . Diabetes mellitus   . GERD (gastroesophageal reflux disease)   . Heart murmur   . Hiatal hernia   . Hyperlipidemia   . Hypertension   . NSTEMI (non-ST elevated  myocardial infarction) ([place]) 07/2011   in setting of Pneumonia with normal coronary arteries on cath  . Pneumonia hx 8/12  . Severe aortic stenosis 2012   s/p bioprosthetic AVR  . Shortness of breath    with exertion  . Upper GI bleeding    Past Surgical History:  Past Surgical History:  Procedure Laterality Date  . AORTIC VALVE REPLACEMENT  10/15/2011   Procedure: AORTIC VALVE REPLACEMENT (AVR);  Surgeon: Tharon Aquas Adelene Idler, MD;  Location: [facility];  Service: Open Heart Surgery;  Laterality: N/A;  . CARDIAC CATHETERIZATION  results on chart   2012 w no significant disease,echo with Severe Aortic stenosis,LVH,Diastolic dysfunction.   . COLONOSCOPY  12/2004  . EYE SURGERY  bil cat 08  . FRACTURE SURGERY  pin in lft hip  . JOINT REPLACEMENT  left knee 08, rt hip 08  . LARYNGOSCOPY  01/05/2013   Procedure: LARYNGOSCOPY;  Surgeon: Melida Quitter, MD;  Location: [facility];  Service: ENT;  Laterality: N/A;  micro direct laryngoscopy with biopsy, possible co2 laser, small laser-safe endotracheal tube  . LARYNGOSCOPY N/A 02/08/2013   Procedure: LARYNGOSCOPY;  Surgeon: Melida Quitter, MD;  Location: [facility];  Service: ENT;  Laterality: N/A;  Micro Direct Laryngoscopy with Co2 laser with re-excision right vocal cord lesion.  . OPEN REDUCTION INTERNAL FIXATION (ORIF) DISTAL RADIAL FRACTURE Left 01/19/2018   Procedure: OPEN REDUCTION INTERNAL FIXATION (ORIF) DISTAL RADIAL FRACTURE;  Surgeon: Marchia Bond, MD;  Location: [facility];  Service: Orthopedics;  Laterality: Left;  .  ORIF FEMUR FRACTURE Left 04/18/2018   Procedure: OPEN REDUCTION INTERNAL FIXATION PROXIMAL FEMORAL SHAFT FRACTURE...REMOVAL OF HARDWARE LEFT FEMUR (HIP FUSION PLATE);  Surgeon: Altamese Plum [place], MD;  Location: [facility];  Service: Orthopedics;  Laterality: Left;  . ORIF HUMERUS FRACTURE Left 01/19/2018   Procedure: OPEN REDUCTION INTERNAL FIXATION (ORIF) PROXIMAL HUMERUS FRACTURE;  Surgeon: Marchia Bond, MD;  Location: [facility];  Service: Orthopedics;   Laterality: Left;  . TONSILLECTOMY    . TOTAL HIP ARTHROPLASTY Right   . TOTAL KNEE ARTHROPLASTY Left   . 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 Per chart review and patient, patient lives alone.  Independent with assistive device.  One level home with 4 steps to entry.  Patient has a son and a brother who take him to the necessary appointments.  Family planning on assistance as needed.  Presented 04/15/2018 after a fall out of bed without loss of consciousness and complains of left hip pain.  X-rays and imaging revealed complex left subtrochanteric hip fracture.  Patient with chronic shortness of breath cardiology services consulted for surgical clearance.  Echocardiogram showed ejection fraction 65% with mild LVH normal function of bioprosthetic AV.  Underwent removal of left femur/hip fusion hardware with ORIF left proximal femur fracture by Dr. Marcelino Scot 04/18/2018.  Touchdown weightbearing left lower extremity with unrestricted movement of ankle and knee.  Hospital course pain management.  Subcutaneous Lovenox for DVT prophylaxis.  Hemoglobin remained stable at 9.1. Patient transferred to CIR on 04/24/2018 .   Patient currently requires max with mobility secondary to muscle weakness and muscle joint tightness, decreased cardiorespiratoy endurance and decreased sitting balance, decreased balance strategies and difficulty maintaining precautions.  Prior to hospitalization, patient was modified independent  with mobility and lived with Alone in a Mobile home home.  Home access is 4Stairs to enter.  Patient will benefit from skilled PT intervention to maximize safe functional mobility,  minimize fall risk and decrease caregiver burden for planned discharge home with intermittent assist.  Anticipate patient will benefit from follow up [facility] at discharge.  PT - End of Session Activity Tolerance: Tolerates 10 - 20 min activity with multiple rests Endurance Deficit: Yes Endurance Deficit Description: endurance limited by pain PT Assessment Rehab Potential (ACUTE/IP ONLY): Fair PT Barriers to Discharge: Inaccessible home environment;Decreased caregiver support;Lack of/limited family support;Weight bearing restrictions PT Barriers to Discharge Comments: Caregivers able to assist intermittently on a daily basis (son and brother), 4 steps to enter home PT Patient demonstrates impairments in the following area(s): Balance;Behavior;Endurance;Pain;Safety PT Transfers Functional Problem(s): Bed Mobility;Bed to Chair;Car;Furniture;Floor PT Locomotion Functional Problem(s): Ambulation;Wheelchair Mobility;Stairs PT Plan PT Intensity: Minimum of 1-2 x/day ,45 to 90 minutes PT Frequency: 5 out of 7 days PT Duration Estimated Length of Stay: 14-18 days PT Treatment/Interventions: Visual/perceptual remediation/compensation;Stair training;Pain management;Disease management/prevention;Ambulation/gait training;Balance/vestibular training;DME/adaptive equipment instruction;Patient/family education;Therapeutic Activities;Wheelchair propulsion/positioning;Therapeutic Exercise;Psychosocial support;Cognitive remediation/compensation;Community reintegration;Skin care/wound management;UE/LE Strength taining/ROM;Functional mobility training;Discharge planning;Neuromuscular re-education;Splinting/orthotics;UE/LE Coordination activities PT Transfers Anticipated Outcome(s): Min assist PT Locomotion Anticipated Outcome(s): Supervision w/c level PT Recommendation Follow Up Recommendations: None Patient destination: Home(Home vs SNF if pt's family unable to assist) Equipment Recommended: To be  determined  Skilled Therapeutic Intervention  Session 1:  Pt in supine and agreeable to  therapy, pain as detailed below and premedicated. Discussed home set-up and PLOF w/ pt including d/c plan and available assist from son and/or brother. Spent majority of session preparing to and transferring to w/c via slide board. Pt limited in L hip flexion to ~45 deg, which he states is chronic from a surgery he had as a child and he has managed to sit at this angle for his whole life. Max assist to transition to EOB w/ severe R lateral lean and posterior lean bias. Eventually able to come to full sitting w/ increased time 2/2 pain, verbal and tactile cues for sequencing and technique. Most of LLE hanging off bed at this point. Set-up slide board for transfer to reclining w/c. Transferred to w/c via slide board to R side using modified technique as pt had himself in reclined position, max assist overall for transfer, pt able to push up through RLE as well w/ therapist blocking BLEs from sliding forward on board. Pt requesting to stay up in w/c in semi-reclined position until next session with this therapist. RN and NT agreeable. Pt able to boost himself w/o physical assist using UEs and RLE. Ended session in w/c, quick release belt donned, call bell within reach and all needs met.   Session 2:  RN requesting therapist assist w/ helping pt back to bed to leave unit for xray. Performed slide board transfer via same technique as detailed above. Total assist to set-up transfer and for board placement. Transferred towards R side back to EOB w/ max assist and in reclined position, 2nd person stand-by assist for safety. Verbal and manual cues for weight shifting. Pt required significantly less time for this transfer compared to earlier session, however still limited by pain, 8/10 in L hip. Total assist +2 to reposition in bed. Ended session in supine and in care of transport tech.   Session 3:  Pt in supine, just returned  to unit from xray. Pt instructed patient in PT Evaluation; see below for results. Pt educated patient in [place], rehab potential, rehab goals, and discharge recommendations. Provided skilled education on realistic expectations regarding long-term living arrangement as pt has had multiple falls over last few months. Educated on types of long-term care including moving in with his son, moving to assisted living facility, etc. Pt verbalized understanding and in agreement that something might need to change to keep him safe. Ended session in supine, call bell within reach and all needs met. Missed 23 min of skilled PT as pt was off unit for x-ray.  PT Evaluation Precautions/Restrictions Precautions Precautions: Fall Restrictions Weight Bearing Restrictions: Yes LLE Weight Bearing: Touchdown weight bearing Other Position/Activity Restrictions: No ROM restrictions noted in note w/ hip, however pt reports pt has been limited to ~45 deg of L hip flexion since a hip surgery he had in the 1950s General   Vital SignsTherapy Vitals Temp: 98.3 F (36.8 C) Temp Source: Oral Pulse Rate: 92 Resp: 19 BP: 132/78 Patient Position (if appropriate): Lying Oxygen Therapy SpO2: 97 % O2 Device: Room Air Pain Pain Assessment Pain Scale: 0-10 Pain Score: 7  Pain Type: Surgical pain Pain Location: Hip Pain Orientation: Left Pain Descriptors / Indicators: Aching Pain Frequency: Intermittent Pain Onset: On-going Patients Stated Pain Goal: 2 Pain Intervention(s): Medication (See eMAR) Multiple Pain Sites: No Home Living/Prior Functioning Home Living Available Help at Discharge: Family;Available PRN/intermittently Type of Home: Mobile home Home Access: Stairs to enter Entrance Stairs-Number of Steps: 4 Entrance Stairs-Rails: Left;Right Home Layout: One level Bathroom  Shower/Tub: Multimedia programmer: Handicapped height  Lives With: Alone Prior Function Level of Independence: Requires assistive  device for independence;Independent with basic ADLs(used cane)  Able to Take Stairs?: Yes Driving: No Vocation: Retired Tax adviser Overall Cognitive Status: No family/caregiver present to determine baseline cognitive functioning Arousal/Alertness: Awake/alert Orientation Level: Oriented X4 Memory: Impaired Memory Impairment: Retrieval deficit;Decreased recall of new information Awareness: Appears intact Problem Solving: Appears intact Safety/Judgment: Appears intact Sensation Sensation Light Touch: Appears Intact Proprioception: Appears Intact Coordination Gross Motor Movements are Fluid and Coordinated: No Coordination and Movement Description: Gross motor movements limited in LLE 2/2 recent hip surgery and chronic/lifelong ROM deficits Finger Nose Finger Test: impaired on LUE due to decreased shoulder ROM from prior fx Motor  Motor Motor: Within Functional Limits  Mobility Bed Mobility Bed Mobility: Rolling Right;Supine to Sit Rolling Right: 3: Mod assist Rolling Right Details: Manual facilitation for placement;Manual facilitation for weight shifting;Verbal cues for technique Supine to Sit: 2: Max assist Supine to Sit Details: Verbal cues for technique;Verbal cues for precautions/safety;Manual facilitation for weight shifting Transfers Transfers: Yes Lateral/Scoot Transfers: 2: Max assist;With slide board Lateral/Scoot Transfer Details: Verbal cues for technique;Verbal cues for precautions/safety;Verbal cues for safe use of DME/AE;Verbal cues for sequencing;Manual facilitation for weight shifting;Manual facilitation for placement Lateral/Scoot Transfer Details (indicate cue type and reason): Modified lateral scoot transfer to accommodate pt's LLE decreased ROM Locomotion  Ambulation Ambulation: No Gait Gait: No Stairs / Additional Locomotion Stairs: No Wheelchair Mobility Wheelchair Mobility: No  Trunk/Postural Assessment  Cervical  Assessment Cervical Assessment: Within Functional Limits Thoracic Assessment Thoracic Assessment: Within Functional Limits Lumbar Assessment Lumbar Assessment: Within Functional Limits Postural Control Postural Control: Deficits on evaluation(impaired 2/2 decreased L hip flexion, posterior and R lateral lean bias)  Balance Balance Balance Assessed: Yes Static Sitting Balance Static Sitting - Balance Support: Bilateral upper extremity supported;Feet supported Static Sitting - Level of Assistance: 4: Min assist Static Sitting - Comment/# of Minutes: Min assist once pt was able to compensate for R lateral lean Extremity Assessment  RUE Assessment RUE Assessment: Within Functional Limits LUE Assessment LUE Assessment: Exceptions to WFL(shoulder flexion limited to ~80* due to prior fx, elbow WFL, wrist fixed due to fx.) RLE Assessment RLE Assessment: Within Functional Limits LLE Assessment LLE Assessment: Exceptions to WFL(Hip flexion 0-45 deg, pt reports this is chronic from hip surgery he had when he was young, pt able to briefly move extremity against gravity, however difficult to assess strength and knee/ankle ROM 2/2 pain)   See Function Navigator for Current Functional Status.   Refer to Care Plan for Long Term Goals  Recommendations for other services: None   Discharge Criteria: Patient will be discharged from PT if patient refuses treatment 3 consecutive times without medical reason, if treatment goals not met, if there is a change in medical status, if patient makes no progress towards goals or if patient is discharged from hospital.  The above assessment, treatment plan, treatment alternatives and goals were discussed and mutually agreed upon: by patient  Jashua Knaak K Arnette 04/25/2018, 2:27 PM

## 2018-04-25 NOTE — Progress Notes (Signed)
[facility] PHYSICAL MEDICINE & REHABILITATION     PROGRESS NOTE  Subjective/Complaints:  Patient seen lying in bed this morning. He states he slept well overnight. He states he is ready to begin therapies today.  ROS: denies CP, SOB, nausea, vomiting, diarrhea.  Objective: Vital Signs: Blood pressure 119/72, pulse 73, temperature 98.4 F (36.9 C), temperature source Oral, resp. rate 15, weight 78.7 kg (173 lb 8 oz), SpO2 98 %. No results found. Recent Labs    04/24/18 1711 04/25/18 0622  WBC 7.2 6.8  HGB 9.1* 9.1*  HCT 30.9* 31.0*  PLT 293 289   Recent Labs    04/23/18 0420 04/24/18 1711 04/25/18 0622  NA 137  --  139  K 4.1  --  4.2  CL 95*  --  99*  GLUCOSE 181*  --  194*  BUN 21*  --  20  CREATININE 1.25* 1.35* 1.16  CALCIUM 9.0  --  8.9   CBG (last 3)  Recent Labs    04/24/18 1641 04/24/18 2143 04/25/18 0643  GLUCAP 170* 218* 187*    Wt Readings from Last 3 Encounters:  04/24/18 78.7 kg (173 lb 8 oz)  04/22/18 81.3 kg (179 lb 3.7 oz)  01/19/18 88.5 kg (195 lb)    Physical Exam:  BP 119/72 (BP Location: Left Arm)   Pulse 73   Temp 98.4 F (36.9 C) (Oral)   Resp 15   Wt 78.7 kg (173 lb 8 oz)   SpO2 98%   BMI 23.53 kg/m  Constitutional: He appears well-developed and well-nourished. NAD. HENT: Normocephalic and atraumatic.  Eyes: EOM are normal. No discharge.  Cardiovascular: Normal rate and regular rhythm. No JVD.  Respiratory: Effort normal and breath sounds normal.  GI: Bowel sounds are normal. He exhibits no distension.  Musculoskeletal: Left hip edema and tenderness  Neurological: He is alert and oriented.  Speech is mildly dysarthric  Motor: Bilateral upper extremities: 4+/5 proximal to distal Right lower extremity: Hip flexion, knee extension 2/5, ankle dorsiflexion 4+/5 Left lower extremity: Hip flexion, knee extension 1+/5, ankle dorsiflexion 4+/5  Skin: Surgical site with dressing C/D/I  Psychiatric: He has a normal mood and affect.  His behavior is normal.   Assessment/Plan: 1. Functional deficits secondary to left hip fracture which require 3+ hours per day of interdisciplinary therapy in a comprehensive inpatient rehab setting. Physiatrist is providing close team supervision and 24 hour management of active medical problems listed below. Physiatrist and rehab team continue to assess barriers to discharge/monitor patient progress toward functional and medical goals.  Function:  Bathing Bathing position      Bathing parts      Bathing assist        Upper Body Dressing/Undressing Upper body dressing                    Upper body assist        Lower Body Dressing/Undressing Lower body dressing                                  Lower body assist        Toileting Toileting          Toileting assist     Transfers Chair/bed Clinical biochemist  Cognition Comprehension    Expression    Social Interaction    Problem Solving    Memory      Medical Problem List and Plan: 1.  Decreased functional mobility secondary to left subtrochanteric femur fracture, retained hip fusion hardware placed in the 1950s and broken hardware status post ORIF 04/18/2018.  Touchdown weightbearing x8 weeks   Begin CIR 2.  DVT Prophylaxis/Anticoagulation: Subcutaneous Lovenox.     Vascular study pending 3. Pain Management: Oxycodone as needed 4. Mood: Provide emotional support 5. Neuropsych: This patient is capable of making decisions on his own behalf. 6. Skin/Wound Care: Routine skin checks 7. Fluids/Electrolytes/Nutrition: Routine in and outs    BMP within acceptable range on 5/21 8.  Acute on chronic anemia.  Continue iron supplement   Hemoglobin 9.1 on 5/21   Continue to monitor 9.  Recent fall with left humeral and radial/ulnar fractures. 10.  Type 2 diabetes mellitus.  Hemoglobin A1c 7.6.  SSI.  Check blood sugars before meals  and at bedtime (on metformin 1000 twice a day, Amaryl 4 mg daily PTA).   Monitor with increased mobility 11.  Hyperlipidemia.  Pravachol 12.  History of severe aortic stenosis.  Status post bioprosthetic AVR 2012. 13.  History of GI bleed.  Continue Protonix   See #8 14. Hypoalbuminemia   Supplement initiated on 5/21  LOS (Days) 1 A FACE TO FACE EVALUATION WAS PERFORMED  Ankit [name] 04/25/2018 8:23 AM

## 2018-04-25 NOTE — Progress Notes (Signed)
Jamse Arn, MD  Physician  Physical Medicine and Rehabilitation  PMR Pre-admission  Addendum  Date of Service:  04/24/2018 12:34 PM       Related encounter: ED to Hosp-Admission (Discharged) from 04/15/2018 in [facility]            Show:Clear all [x] Manual[x] Template[x] Copied  Added by: [x] Asar Evilsizer, Evalee Mutton, RN[x] Jamse Arn, MD   [] Hover for details   PMR Admission Coordinator Pre-Admission Assessment  Patient: Casey Gilmore is an 77 y.o., male MRN: [id number] DOB: 12/19/1940 Height: 6' (182.9 cm) Weight: 81.3 kg (179 lb 3.7 oz)                                                                                                                                                  Insurance Information HMO: Yes   PPO:       PCP:       IPA:       80/20:       OTHER:   PRIMARY:  BCBS Medicare      Policy#: [id number]      Subscriber:  patient CM Name: Limmie Patricia      Phone#: [phone]     Fax#: [phone] Pre-Cert#:                                    Employer: Retired Benefits:  Phone #:  [phone]     Name:  On line Eff. Date: 12/07/15     Deduct:  $0      Out of Pocket Max: $5500 (met $1461.76      Life Max:  N/A CIR: $310 days 1-6      SNF:  $0 days 1-20; $172 days 21-60; $0 days 61-100 Outpatient: medical necessity     Co-Pay: $40/visit Home Health: 1005      Co-Pay: none DME: 80%     Co-Pay: 20% Providers: in network  Emergency Contact Information         Contact Information    Name Relation Home Work [place] Son [phone]     [place], Laverdiere Loney Laurence [phone]       Current Medical History  Patient Admitting Diagnosis:  L subtrochanteric hip fracture  History of Present Illness: a 77 year old right-handed male with history of COPD, left hip fusion as a youth, diabetes mellitus, AVR, esophageal cancer and chronic anemia as well as recent fall with left humeral  and radial ulnar fractures..Per chart review patient lives alone. Independent with assistive device. One level home with 4 steps to entry. Patient has a son and a brother who take him to the necessary appointments. Family planning on assistance as needed. Presented 04/15/2018 after a fall out of bed without loss of  consciousness and complains of left hip pain. X-rays and imaging revealed complex left subtrochanteric hip fracture. Patient with chronic shortness of breath cardiology services consulted for surgical clearance. Echocardiogram showed ejection fraction 65% with mild LVH normal function of bioprosthetic AV. Underwent removal of left femur/hip fusion hardware with ORIF left proximal femur fracture by Dr. Marcelino Scot 04/18/2018. Touchdown weightbearing left lower extremity with unrestricted movement of ankle and knee. Hospital course pain management. Subcutaneous Lovenox for DVT prophylaxis. Hemoglobin remained stable at 9.4. Physical and occupational therapy evaluations completed. Patient to be admitted for a comprehensive inpatient rehab program.  Past Medical History      Past Medical History:  Diagnosis Date  . Anemia    2012  . Arthritis   . Blood transfusion   . 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 Closed fracture of left distal radius and ulna 01/19/2018  . Closed fracture of left proximal humerus 01/19/2018  . COPD (chronic obstructive pulmonary disease) ([place])   . Diabetes mellitus   . GERD (gastroesophageal reflux disease)   . Heart murmur   . Hiatal hernia   . Hyperlipidemia   . Hypertension   . NSTEMI (non-ST elevated myocardial infarction) ([place]) 07/2011   in setting of Pneumonia with normal coronary arteries on cath  . Pneumonia hx 8/12  . Severe aortic stenosis 2012   s/p bioprosthetic AVR  . Shortness of breath    with exertion  . Upper GI bleeding     Family History  family history is not on file.  Prior Rehab/Hospitalizations: Patient fell  3-4 months ago and has been receiving HH therapies for L shoulder and left wrist.  Has the patient had major surgery during 100 days prior to admission? Yes.  Had surgery 3-4 months ago on his left shoulder and left wrist.  Current Medications   Current Facility-Administered Medications:  .  0.9 %  sodium chloride infusion, , Intravenous, Once, Ainsley Spinner, PA-C .  acetaminophen (TYLENOL) tablet 650 mg, 650 mg, Oral, Q6H, Ainsley Spinner, PA-C, 650 mg at 04/24/18 0846 .  acetaminophen (TYLENOL) tablet 650 mg, 650 mg, Oral, Once, Ainsley Spinner, PA-C .  docusate sodium (COLACE) capsule 100 mg, 100 mg, Oral, BID, Ainsley Spinner, PA-C, 100 mg at 04/24/18 1110 .  enoxaparin (LOVENOX) injection 40 mg, 40 mg, Subcutaneous, Q24H, Ainsley Spinner, PA-C, 40 mg at 04/24/18 0847 .  ferrous sulfate tablet 325 mg, 325 mg, Oral, BID WC, Ainsley Spinner, PA-C, 325 mg at 04/24/18 0846 .  insulin aspart (novoLOG) injection 0-5 Units, 0-5 Units, Subcutaneous, QHS, Ainsley Spinner, PA-C .  insulin aspart (novoLOG) injection 0-9 Units, 0-9 Units, Subcutaneous, TID WC, Ainsley Spinner, PA-C, 2 Units at 04/24/18 1323 .  morphine 2 MG/ML injection 2 mg, 2 mg, Intravenous, Q3H PRN, Ainsley Spinner, PA-C, 2 mg at 04/18/18 1725 .  ondansetron (ZOFRAN) tablet 4 mg, 4 mg, Oral, Q6H PRN, 4 mg at 04/20/18 0759 **OR** ondansetron (ZOFRAN) injection 4 mg, 4 mg, Intravenous, Q6H PRN, Ainsley Spinner, PA-C, 4 mg at 04/18/18 1424 .  ondansetron (ZOFRAN) tablet 4 mg, 4 mg, Oral, Q8H PRN, Ainsley Spinner, PA-C .  oxyCODONE (Oxy IR/ROXICODONE) immediate release tablet 5-10 mg, 5-10 mg, Oral, Q4H PRN, Ainsley Spinner, PA-C, 10 mg at 04/23/18 1338 .  pantoprazole (PROTONIX) EC tablet 40 mg, 40 mg, Oral, Daily, Ainsley Spinner, PA-C, 40 mg at 04/24/18 1110 .  pravastatin (PRAVACHOL) tablet 20 mg, 20 mg, Oral, Daily, Ainsley Spinner, PA-C, 20 mg at 04/24/18 1110 .  sorbitol  70 % solution 30 mL, 30 mL, Oral, Daily PRN, Ainsley Spinner, PA-C .  vitamin B-12 (CYANOCOBALAMIN) tablet 4,000  mcg, 4,000 mcg, Oral, Daily, Ainsley Spinner, PA-C, 4,000 mcg at 04/24/18 1030 .  Vitamin D (Ergocalciferol) (DRISDOL) capsule 50,000 Units, 50,000 Units, Oral, Q7 days, Ainsley Spinner, PA-C, 50,000 Units at 04/23/18 0935  Patients Current Diet:       Diet Order           Diet Carb Modified Fluid consistency: Thin; Room service appropriate? Yes  Diet effective now          Precautions / Restrictions Precautions Precautions: Fall Restrictions Weight Bearing Restrictions: Yes LLE Weight Bearing: Touchdown weight bearing   Has the patient had 2 or more falls or a fall with injury in the past year?Yes.  Has suffered 2 falls with injury.    Prior Activity Level Household: Went out 1-3 times a month to pick up medications; drives on occasion.  Home Assistive Devices / Equipment Home Assistive Devices/Equipment: None Home Equipment: Cane - single point  Prior Device Use: Indicate devices/aids used by the patient prior to current illness, exacerbation or injury? Cane  Prior Functional Level Prior Function Level of Independence: Independent with assistive device(s)  Self Care: Did the patient need help bathing, dressing, using the toilet or eating?  Independent  Indoor Mobility: Did the patient need assistance with walking from room to room (with or without device)? Independent  Stairs: Did the patient need assistance with internal or external stairs (with or without device)? Independent  Functional Cognition: Did the patient need help planning regular tasks such as shopping or remembering to take medications? Independent  Current Functional Level Cognition  Overall Cognitive Status: No family/caregiver present to determine baseline cognitive functioning Orientation Level: Oriented to person Following Commands: Follows multi-step commands inconsistently General Comments: Pt with difficulty reporting full understanding of his current situation.     Extremity  Assessment (includes Sensation/Coordination)  Upper Extremity Assessment: LUE deficits/detail LUE Deficits / Details: 3 months post wrist and shoulder surgery. Pt not to push/pull with L UE. Limited AROM.   Lower Extremity Assessment: Defer to PT evaluation RLE Deficits / Details: stiff and limited by pain LLE Deficits / Details: limited ROM at hip and knee,  pain limiting LLE Coordination: decreased gross motor, decreased fine motor    ADLs  Overall ADL's : Needs assistance/impaired Eating/Feeding: Minimal assistance, Bed level Grooming: Minimal assistance, Bed level Upper Body Bathing: Maximal assistance, Bed level Lower Body Bathing: Total assistance, Bed level Upper Body Dressing : Maximal assistance, Bed level Lower Body Dressing: Total assistance, Bed level General ADL Comments: Pt able to sit at EOB but unable to achieve midline positioning due to pain and limited L knee ROM.     Mobility  Overal bed mobility: Needs Assistance Bed Mobility: Supine to Sit, Sit to Supine Supine to sit: Max assist, +2 for physical assistance Sit to supine: Total assist, +2 for physical assistance General bed mobility comments: Pt able to initiate movement and attempt bridging to transition to EOB but requiring max assist +2 to complete sit<>supine. Difficulty achieving fully upright position secondary to pain and decreased L hip flexion.     Transfers  General transfer comment: unable    Ambulation / Gait / Stairs / Wheelchair Mobility  Ambulation/Gait General Gait Details: unable    Posture / Balance Dynamic Sitting Balance Sitting balance - Comments: heavy R lateral lean Balance Overall balance assessment: Needs assistance Sitting-balance support: Feet supported, Bilateral upper  extremity supported Sitting balance-Leahy Scale: Poor Sitting balance - Comments: heavy R lateral lean Postural control: Right lateral lean, Posterior lean    Special needs/care consideration  BiPAP/CPAP No CPM No Continuous Drip IV KVO Dialysis No        Life Vest No Oxygen No Special Bed No Trach Size No Wound Vac (area) No      Skin Left hip incision post op                               Bowel mgmt: Last BM 04/22/18 Bladder mgmt: Voiding WDL Diabetic mgmt Yes, on insulin at home    Previous Home Environment Living Arrangements: Alone Available Help at Discharge: Family, Available PRN/intermittently Type of Home: House Home Layout: One level Home Access: Stairs to enter Entrance Stairs-Rails: Left, Right Entrance Stairs-Number of Steps: 4 Bathroom Shower/Tub: Multimedia programmer: Handicapped height Home Care Services: No  Discharge Living Setting Plans for Discharge Living Setting: Alone, Other (Comment)(Lives alone in a modular home.) Type of Home at Discharge: Other (Comment)(Modular home) Discharge Home Layout: One level Discharge Home Access: Stairs to enter Entrance Stairs-Number of Steps: 3 steps to back deck and then a small 1/2 step into home. Does the patient have any problems obtaining your medications?: No  Social/Family/Support Systems Patient Roles: Parent(Divorced; has 2 grown sons.) Contact Information: Winton Offord - son - [phone] Anticipated Caregiver: Sons and brother Ability/Limitations of Caregiver: Sons works; brother can see patient daily and assist. Caregiver Availability: Intermittent Discharge Plan Discussed with Primary Caregiver: Yes Is Caregiver In Agreement with Plan?: Yes Does Caregiver/Family have Issues with Lodging/Transportation while Pt is in Rehab?: No  Goals/Additional Needs Patient/Family Goal for Rehab: PT/OT supervision to min assist goals Expected length of stay: 12-16 days Cultural Considerations: None Dietary Needs: Carb mod, med cal, thin liquids Equipment Needs: TBD Pt/Family Agrees to Admission and willing to participate: Yes Program Orientation Provided & Reviewed with Pt/Caregiver  Including Roles  & Responsibilities: Yes  Decrease burden of Care through IP rehab admission: N/A  Possible need for SNF placement upon discharge: Potentially  Patient Condition: This patient's medical and functional status has changed since the consult dated: 04/20/18 in which the Rehabilitation Physician determined and documented that the patient's condition is appropriate for intensive rehabilitative care in an inpatient rehabilitation facility. See "History of Present Illness" (above) for medical update. Functional changes are:  Currently requiring max/total assist for mobility and ADLs. Patient's medical and functional status update has been discussed with the Rehabilitation physician and patient remains appropriate for inpatient rehabilitation. Will admit to inpatient rehab today.  Preadmission Screen Completed By:  Retta Diones, 04/24/2018 1:49 PM ______________________________________________________________________   Discussed status with Dr. Posey Pronto on 04/24/18 at 1349 and received telephone approval for admission today.  Admission Coordinator:  Retta Diones, time 1349/Date 04/24/18         Revision History

## 2018-04-25 NOTE — Evaluation (Signed)
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 Hypoalbuminemia due to protein-calorie malnutrition ([place])   . Closed left subtrochanteric femur fracture ([place]) 04/24/2018  . Acute blood loss anemia   . Anemia of chronic disease   . Diabetes mellitus type 2 in nonobese ([facility])   . Hyperlipidemia   . Aortic valve stenosis   . History of GI bleed   . Anemia 04/17/2018  . Closed left hip fracture ([place]) 04/15/2018  . Closed fracture of left proximal humerus 01/19/2018  . Closed fracture of left distal radius and ulna 01/19/2018  . 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 COPD (chronic obstructive pulmonary disease) with chronic bronchitis ([place]) 10/29/2014  . Chronic diastolic heart failure ([place]) 06/24/2014  . Benign essential HTN 06/24/2014  . 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 History of non-ST elevation myocardial infarction (NSTEMI) 06/24/2014  . Diabetes mellitus ([place]) 10/17/2011  . Osteoarthritis 10/17/2011    Past Medical History:  Past Medical History:  Diagnosis Date  . Anemia    2012  . Arthritis   . Blood transfusion   . 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 Closed fracture of left distal radius and ulna 01/19/2018  . Closed fracture of left proximal humerus 01/19/2018  . COPD (chronic obstructive pulmonary disease) ([place])   . Diabetes mellitus   . GERD (gastroesophageal reflux disease)   . Heart murmur   . Hiatal hernia   . Hyperlipidemia   . Hypertension   . NSTEMI (non-ST elevated myocardial infarction) ([place])  07/2011   in setting of Pneumonia with normal coronary arteries on cath  . Pneumonia hx 8/12  . Severe aortic stenosis 2012   s/p bioprosthetic AVR  . Shortness of breath    with exertion  . Upper GI bleeding    Past Surgical History:  Past Surgical History:  Procedure Laterality Date  . AORTIC VALVE REPLACEMENT  10/15/2011   Procedure: AORTIC VALVE REPLACEMENT (AVR);  Surgeon: Tharon Aquas Adelene Idler, MD;  Location: [facility];  Service: Open Heart Surgery;  Laterality: N/A;  . CARDIAC CATHETERIZATION  results on chart   2012 w no significant disease,echo with Severe Aortic stenosis,LVH,Diastolic dysfunction.   . COLONOSCOPY  12/2004  . EYE SURGERY  bil cat 08  . FRACTURE SURGERY  pin in lft hip  . JOINT REPLACEMENT  left knee 08, rt hip 08  . LARYNGOSCOPY  01/05/2013   Procedure: LARYNGOSCOPY;  Surgeon: Melida Quitter, MD;  Location: [facility];  Service: ENT;  Laterality: N/A;  micro direct laryngoscopy with biopsy, possible co2 laser, small laser-safe endotracheal tube  . LARYNGOSCOPY N/A 02/08/2013   Procedure: LARYNGOSCOPY;  Surgeon: Melida Quitter, MD;  Location: [facility];  Service: ENT;  Laterality: N/A;  Micro Direct Laryngoscopy with Co2 laser with re-excision right vocal cord lesion.  . OPEN REDUCTION INTERNAL FIXATION (ORIF) DISTAL RADIAL FRACTURE Left 01/19/2018   Procedure: OPEN REDUCTION INTERNAL FIXATION (ORIF) DISTAL RADIAL FRACTURE;  Surgeon: Marchia Bond, MD;  Location: [facility];  Service: Orthopedics;  Laterality: Left;  . ORIF FEMUR FRACTURE Left 04/18/2018  Procedure: OPEN REDUCTION INTERNAL FIXATION PROXIMAL FEMORAL SHAFT FRACTURE...REMOVAL OF HARDWARE LEFT FEMUR (HIP FUSION PLATE);  Surgeon: Altamese Noyack, MD;  Location: [facility];  Service: Orthopedics;  Laterality: Left;  . ORIF HUMERUS FRACTURE Left 01/19/2018   Procedure: OPEN REDUCTION INTERNAL FIXATION (ORIF) PROXIMAL HUMERUS FRACTURE;  Surgeon: Marchia Bond, MD;  Location: [facility];  Service: Orthopedics;  Laterality: Left;  .  TONSILLECTOMY    . TOTAL HIP ARTHROPLASTY Right   . TOTAL KNEE ARTHROPLASTY Left   . 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 right-handed male with history of COPD, left hip fusion as a youth, diabetes mellitus, AVR, esophageal cancer and chronic anemia as well as recent fall with left humeral and radial ulnar fractures.. Per chart review and patient, patient lives alone.  Independent with assistive device.  One level home with 4 steps to entry.  Patient has a son and a brother who take him to the necessary appointments.  Family planning on assistance as needed.  Presented 04/15/2018 after a fall out of bed without loss of consciousness and complains of left hip pain.  X-rays and imaging revealed complex left subtrochanteric hip fracture.  Patient with chronic shortness of breath cardiology services consulted for surgical clearance.  Echocardiogram showed ejection fraction 65% with mild LVH normal function of bioprosthetic AV.  Underwent removal of left femur/hip fusion hardware with ORIF left proximal femur fracture by Dr. Marcelino Scot 04/18/2018.  Touchdown weightbearing left lower extremity with unrestricted movement of ankle and knee.      Patient transferred to CIR on 04/24/2018 .    Patient currently requires total with basic self-care skills secondary to muscle weakness, decreased initiation and decreased memory and decreased sitting balance, decreased standing balance, decreased postural control and decreased balance strategies.  Prior to hospitalization, patient could complete ADLs with modified independent .  Patient will benefit from skilled intervention to decrease level of assist with basic self-care skills prior to discharge home with care partner.  Anticipate patient will require 24 hour supervision and minimal physical assistance and follow up home health.  OT - End of  Session Activity Tolerance: Tolerates 30+ min activity with multiple rests Endurance Deficit: Yes Endurance Deficit Description: endurance limited by pain OT Assessment Rehab Potential (ACUTE ONLY): Fair OT Barriers to Discharge: Decreased caregiver support;Weight bearing restrictions OT Patient demonstrates impairments in the following area(s): Balance;Endurance;Motor;Pain;Safety OT Basic ADL's Functional Problem(s): Grooming;Bathing;Dressing;Toileting OT Advanced ADL's Functional Problem(s): Simple Meal Preparation OT Transfers Functional Problem(s): Toilet;Tub/Shower OT Additional Impairment(s): None OT Plan OT Intensity: Minimum of 1-2 x/day, 45 to 90 minutes OT Frequency: 5 out of 7 days OT Duration/Estimated Length of Stay: 14-18 days OT Treatment/Interventions: Balance/vestibular training;Discharge planning;Disease Lawyer;Functional mobility training;Pain management;Patient/family education;Psychosocial support;Self Care/advanced ADL retraining;Skin care/wound managment;Therapeutic Activities;Therapeutic Exercise;UE/LE Strength taining/ROM;UE/LE Coordination activities;Wheelchair propulsion/positioning OT Basic Self-Care Anticipated Outcome(s): Min assist - supervision OT Toileting Anticipated Outcome(s): Min assist OT Bathroom Transfers Anticipated Outcome(s): Min assist OT Recommendation Patient destination: Home Follow Up Recommendations: Home health OT;24 hour supervision/assistance Equipment Recommended: 3 in 1 bedside comode;Tub/shower bench   Skilled Therapeutic Intervention OT eval completed with discussion of rehab process, OT purpose, POC, ELOS, and goals.  Limited ADL assessment due to pain and fearfulness with mobility.  Pt received supine in bed requiring max assist and increased time to engage in bed mobility to come to sitting at EOB.  Pt with severe Rt  lean in sitting due to tightness in Lt hip ?premorbid as well as  exacerbated due to surgery.  Pt completed minimal bathing due to fearfulness with mobility, unsteadiness in unsupported sitting, and decreased ROM in LUE from previous fx.  Engaged in sit > stand x2 with +2 assist due to fearfulness and preference to lean towards Rt.  2nd person provided UE support and assist to weight shift towards midline while therapist provided mod assist to lift into standing.  Once standing pt able to tolerate standing ~30 seconds and able to bring LLE into midline stance.  Pt requested to return to bed due to pain.  +2 for bed mobility due to tenderness and guarding at Lt hip.  OT Evaluation Precautions/Restrictions  Precautions Precautions: Fall Restrictions Weight Bearing Restrictions: Yes LLE Weight Bearing: Touchdown weight bearing Pain Pain Assessment Pain Scale: 0-10 Pain Score: 5  Pain Type: Surgical pain Pain Location: Hip Pain Orientation: Left Pain Descriptors / Indicators: Aching Pain Onset: On-going Pain Intervention(s): RN made aware;Repositioned(premedicated) Home Living/Prior Functioning Home Living Family/patient expects to be discharged to:: Private residence Living Arrangements: Alone Available Help at Discharge: Family, Available PRN/intermittently Type of Home: Mobile home Home Access: Stairs to enter Technical brewer of Steps: 4 Entrance Stairs-Rails: Left, Right Home Layout: One level Bathroom Shower/Tub: Multimedia programmer: Handicapped height  Lives With: Alone Prior Function Level of Independence: Requires assistive device for independence, Independent with basic ADLs ADL  See Function Navigator Vision Baseline Vision/History: Wears glasses Wears Glasses: Reading only Patient Visual Report: No change from baseline Vision Assessment?: No apparent visual deficits Cognition Overall Cognitive Status: No family/caregiver present to determine baseline cognitive functioning Arousal/Alertness:  Awake/alert Orientation Level: Person;Place;Situation Person: Oriented Place: Oriented Situation: Oriented Year: 2019 Month: June Day of Week: Correct Memory: Impaired Memory Impairment: Retrieval deficit;Decreased recall of new information Immediate Memory Recall: Sock;Blue;Bed Memory Recall: Blue Memory Recall Blue: Without Cue Problem Solving: Appears intact Safety/Judgment: Appears intact Sensation Sensation Light Touch: Appears Intact Proprioception: Appears Intact Coordination Finger Nose Finger Test: impaired on LUE due to decreased shoulder ROM from prior fx Extremity/Trunk Assessment RUE Assessment RUE Assessment: Within Functional Limits LUE Assessment LUE Assessment: Exceptions to WFL(shoulder flexion limited to ~80* due to prior fx, elbow WFL, wrist fixed due to fx.)   See Function Navigator for Current Functional Status.   Refer to Care Plan for Long Term Goals  Recommendations for other services: None    Discharge Criteria: Patient will be discharged from OT if patient refuses treatment 3 consecutive times without medical reason, if treatment goals not met, if there is a change in medical status, if patient makes no progress towards goals or if patient is discharged from hospital.  The above assessment, treatment plan, treatment alternatives and goals were discussed and mutually agreed upon: by patient  [name] 04/25/2018, 12:51 PM

## 2018-04-25 NOTE — Progress Notes (Signed)
Meredith Staggers, MD  Physician  Physical Medicine and Rehabilitation  Consult Note  Signed  Date of Service:  04/20/2018 10:00 AM       Related encounter: ED to Hosp-Admission (Discharged) from 04/15/2018 in [place] All Collapse All       Show:Clear all [x] Manual[x] Template[] Copied  Added by: [x] Love, Ivan Anchors, PA-C[x] Meredith Staggers, MD   [] Hover for details        Physical Medicine and Rehabilitation Consult   Reason for Consult: Functional decline due to subtrochanteric fracture Referring Physician: Dr. Marcelino Scot   HPI: Casey Gilmore is a 77 y.o. 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 He was found to have complex left subtrochanteric fracture. Patient with chronic SOB and cardiology consulted for surgical clearance. 2 D echo done revealing EF 60-65% with mild LVH and normal function of bioprosthetic AV.  He underwent removal of left femur/hip fusion hardware with ORIF left proximal femur fx by Dr. Marcelino Scot on 04/18/18. Post op to be TDWB on LLL with unrestricted movement of knee and ankle. Patient was ambulating on left toes at baseline without AD and CIR consulted for intensive rehab program.    Review of Systems  Constitutional: Negative for chills and fever.  HENT: Positive for hearing loss (right ear).   Eyes: Negative for blurred vision and double vision.  Respiratory: Positive for shortness of breath (with activity). Negative for cough.   Cardiovascular: Negative for chest pain and palpitations.  Gastrointestinal: Positive for heartburn. Negative for abdominal pain.  Genitourinary: Negative for dysuria and urgency.  Musculoskeletal: Positive for joint pain. Negative for myalgias and neck pain.  Neurological: Positive  for weakness. Negative for dizziness and headaches.  Psychiatric/Behavioral: Positive for depression. The patient is not nervous/anxious.          Past Medical History:  Diagnosis Date  . Anemia    2012  . Arthritis   . Blood transfusion   . 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 Closed fracture of left distal radius and ulna 01/19/2018  . Closed fracture of left proximal humerus 01/19/2018  . COPD (chronic obstructive pulmonary disease) ([place])   . Diabetes mellitus   . GERD (gastroesophageal reflux disease)   . Heart murmur   . Hiatal hernia   . Hyperlipidemia   . Hypertension   . NSTEMI (non-ST elevated myocardial infarction) ([place]) 07/2011   in setting of Pneumonia with normal coronary arteries on cath  . Pneumonia hx 8/12  . Severe aortic stenosis 2012   s/p bioprosthetic AVR  . Shortness of breath    with exertion  . Upper GI bleeding          Past Surgical History:  Procedure Laterality Date  . AORTIC VALVE REPLACEMENT  10/15/2011   Procedure: AORTIC VALVE REPLACEMENT (AVR);  Surgeon: Tharon Aquas Adelene Idler, MD;  Location: [facility];  Service: Open Heart Surgery;  Laterality: N/A;  . CARDIAC CATHETERIZATION  results on chart   2012 w no significant disease,echo with Severe Aortic stenosis,LVH,Diastolic dysfunction.   . COLONOSCOPY  12/2004  . EYE SURGERY  bil cat 08  . FRACTURE SURGERY  pin in lft hip  . JOINT REPLACEMENT  left knee 08, rt hip 08  .  LARYNGOSCOPY  01/05/2013   Procedure: LARYNGOSCOPY;  Surgeon: Melida Quitter, MD;  Location: [facility];  Service: ENT;  Laterality: N/A;  micro direct laryngoscopy with biopsy, possible co2 laser, small laser-safe endotracheal tube  . LARYNGOSCOPY N/A 02/08/2013   Procedure: LARYNGOSCOPY;  Surgeon: Melida Quitter, MD;  Location: [facility];  Service: ENT;  Laterality: N/A;  Micro Direct Laryngoscopy with Co2 laser with re-excision right vocal cord lesion.  . OPEN REDUCTION INTERNAL FIXATION (ORIF) DISTAL RADIAL FRACTURE Left  01/19/2018   Procedure: OPEN REDUCTION INTERNAL FIXATION (ORIF) DISTAL RADIAL FRACTURE;  Surgeon: Marchia Bond, MD;  Location: [facility];  Service: Orthopedics;  Laterality: Left;  . ORIF FEMUR FRACTURE Left 04/18/2018   Procedure: OPEN REDUCTION INTERNAL FIXATION PROXIMAL FEMORAL SHAFT FRACTURE...REMOVAL OF HARDWARE LEFT FEMUR (HIP FUSION PLATE);  Surgeon: Altamese Lady Lake, MD;  Location: [facility];  Service: Orthopedics;  Laterality: Left;  . ORIF HUMERUS FRACTURE Left 01/19/2018   Procedure: OPEN REDUCTION INTERNAL FIXATION (ORIF) PROXIMAL HUMERUS FRACTURE;  Surgeon: Marchia Bond, MD;  Location: [facility];  Service: Orthopedics;  Laterality: Left;  . TONSILLECTOMY    . TOTAL HIP ARTHROPLASTY Right   . TOTAL KNEE ARTHROPLASTY Left   . 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 No pertinent family history.    Social History: Lives alone. Son and daughter- in- law live next door and can check in after discharge. Retired 4 years ago and was independent without AD. He reports that he quit smoking about 39 years ago. His smoking use included cigarettes. He has a 50.00 pack-year smoking history. He has never used smokeless tobacco. He reports that he does not drink alcohol or use drugs.    Allergies: No Known Allergies          Medications Prior to Admission  Medication Sig Dispense Refill  . cyanocobalamin 2000 MCG tablet Take 4,000 mcg by mouth daily.     . furosemide (LASIX) 40 MG tablet Take 40 mg by mouth daily.     Marland Kitchen glimepiride (AMARYL) 4 MG tablet Take 4 mg by mouth daily before breakfast.     . HYDROcodone-acetaminophen (NORCO/VICODIN) 5-325 MG tablet TK 1 T PO TID PRF PAIN  0  . metFORMIN (GLUCOPHAGE) 1000 MG tablet Take 1,000 mg by mouth 2 (two) times daily with a meal.    . Multiple Vitamins-Minerals (MULTIVITAMIN ADULTS) TABS Take 1 tablet by mouth daily.    Marland Kitchen omeprazole (PRILOSEC) 40 MG  capsule Take 40 mg by mouth daily.    . ondansetron (ZOFRAN) 4 MG tablet Take 1 tablet (4 mg total) by mouth every 8 (eight) hours as needed for nausea or vomiting. 10 tablet 0  . pravastatin (PRAVACHOL) 20 MG tablet Take 20 mg by mouth daily.  5  . PRESCRIPTION MEDICATION Inject 40 Units into the skin daily. Insulin Doctor is giving him samples of    . Vitamin D, Ergocalciferol, (DRISDOL) 50000 units CAPS capsule TK 1 C PO 2 TIMES A WK FOR 6 MONTHS  6  . HYDROcodone-acetaminophen (NORCO) 10-325 MG tablet Take 1 tablet by mouth every 6 (six) hours as needed. (Patient not taking: Reported on 04/15/2018) 30 tablet 0    Home: Home Living Family/patient expects to be discharged to:: Private residence Living Arrangements: Alone Available Help at Discharge: Family, Available PRN/intermittently Type of Home: House Home Access: Stairs to enter [employer] of Steps: 4 Entrance Stairs-Rails: Left, Right Home Layout: One level Bathroom Shower/Tub:  Walk-in shower Bathroom Toilet: Handicapped height Home Equipment: Cane - single point  Functional History: Prior Function Level of Independence: Independent with assistive device(s) Functional Status:  Mobility: Bed Mobility Overal bed mobility: Needs Assistance Bed Mobility: Supine to Sit, Sit to Supine Supine to sit: Max assist, +2 for physical assistance Sit to supine: Total assist, +2 for physical assistance General bed mobility comments: Pt able to initiate movement and attempt bridging to transition to EOB but requiring max assist +2 to complete sit<>supine. Difficulty achieving fully upright position secondary to pain and decreased L hip flexion.  Transfers General transfer comment: unable Ambulation/Gait General Gait Details: unable  ADL: ADL Overall ADL's : Needs assistance/impaired Eating/Feeding: Minimal assistance, Bed level Grooming: Minimal assistance, Bed level Upper Body Bathing: Maximal assistance, Bed  level Lower Body Bathing: Total assistance, Bed level Upper Body Dressing : Maximal assistance, Bed level Lower Body Dressing: Total assistance, Bed level General ADL Comments: Pt able to sit at EOB but unable to achieve midline positioning due to pain and limited L knee ROM.   Cognition: Cognition Overall Cognitive Status: No family/caregiver present to determine baseline cognitive functioning Orientation Level: Oriented X4 Cognition Arousal/Alertness: Awake/alert Behavior During Therapy: WFL for tasks assessed/performed Overall Cognitive Status: No family/caregiver present to determine baseline cognitive functioning Area of Impairment: Memory, Following commands, Awareness, Problem solving Memory: Decreased short-term memory Following Commands: Follows multi-step commands inconsistently Awareness: Emergent Problem Solving: Slow processing, Difficulty sequencing General Comments: Pt with difficulty reporting full understanding of his current situation.   Blood pressure (!) 108/56, pulse 98, temperature 98.6 F (37 C), temperature source Oral, resp. rate 16, height 6' (1.829 m), weight 79.4 kg (175 lb), SpO2 100 %. Physical Exam  Nursing note and vitals reviewed. Constitutional:  Pleasant and appropriate  Musculoskeletal:  LLE>RLE limitation due to pain inhibition.   Neurological:  Mild dysarthria. Able to follow basic commands and answer orientation questions without difficulty.              Assessment/Plan: Diagnosis: left subtrochanteric hip fx s/p hardware removal/ORIF 1. Does the need for close, 24 hr/day medical supervision in concert with the patient's rehab needs make it unreasonable for this patient to be served in a less intensive setting? Yes 2. Co-Morbidities requiring supervision/potential complications: COPD, cdCHF, DM, acute blood loss anemia, pain mgt 3. Due to bladder management, bowel management, safety, skin/wound care, disease management, medication  administration, pain management and patient education, does the patient require 24 hr/day rehab nursing? Yes 4. Does the patient require coordinated care of a physician, rehab nurse, PT (1-2 hrs/day, 5 days/week) and OT (1-2 hrs/day, 5 days/week) to address physical and functional deficits in the context of the above medical diagnosis(es)? Yes Addressing deficits in the following areas: balance, endurance, locomotion, strength, transferring, bowel/bladder control, bathing, dressing, feeding, grooming, toileting and psychosocial support 5. Can the patient actively participate in an intensive therapy program of at least 3 hrs of therapy per day at least 5 days per week? Yes 6. The potential for patient to make measurable gains while on inpatient rehab is good 7. Anticipated functional outcomes upon discharge from inpatient rehab are supervision  with PT, supervision and min assist with OT, n/a with SLP. 8. Estimated rehab length of stay to reach the above functional goals is: 12-16 days 9. Anticipated D/C setting: Home 10. Anticipated post D/C treatments: [facility] therapy 11. Overall Rehab/Functional Prognosis: good  RECOMMENDATIONS: This patient's condition is appropriate for continued rehabilitative care in the following setting: CIR Patient has  agreed to participate in recommended program. Yes Note that insurance prior authorization may be required for reimbursement for recommended care.  Comment: Rehab Admissions Coordinator to follow up.  Thanks,  Meredith Staggers, MD, Mellody Drown      Bary Leriche, PA-C 04/20/2018          Revision History                        Routing History

## 2018-04-25 NOTE — Progress Notes (Signed)
Bilateral lower extremity venous duplex completed. Preliminary results - There is no evidence of DVT, superficial thrombosis, or Baker's cyst. Toma Copier, RVS 04/25/2018 4:20 PM

## 2018-04-26 ENCOUNTER — Inpatient Hospital Stay (HOSPITAL_COMMUNITY): Payer: Medicare Other | Admitting: Physical Therapy

## 2018-04-26 ENCOUNTER — Inpatient Hospital Stay (HOSPITAL_COMMUNITY): Payer: Medicare Other

## 2018-04-26 LAB — GLUCOSE, CAPILLARY
GLUCOSE-CAPILLARY: 212 mg/dL — AB (ref 65–99)
Glucose-Capillary: 216 mg/dL — ABNORMAL HIGH (ref 65–99)
Glucose-Capillary: 232 mg/dL — ABNORMAL HIGH (ref 65–99)
Glucose-Capillary: 217 mg/dL — ABNORMAL HIGH (ref 65–99)

## 2018-04-26 MED ORDER — METFORMIN HCL 500 MG PO TABS
500.0000 mg | ORAL_TABLET | Freq: Two times a day (BID) | ORAL | Status: DC
  Administered 2018-04-26 – 2018-04-28 (×5): 500 mg via ORAL
  Filled 2018-04-26 (×5): qty 1

## 2018-04-26 NOTE — Progress Notes (Signed)
Physical Therapy Note  Patient Details  Name: Casey Gilmore MRN: [id number] Date of Birth: Nov 15, 1941 Today's Date: 04/26/2018  1300-1330, 30 min individual tx Pain: 5/10 L hip, premedicated  Pt seated in recliner w/c, appearing at risk of sliding forward.  PT adjusted L ELR and slide board supporting LLE to secure it to w/c. Also, with w/c tipped back (anti-tippers up), pt able to scoot back into w/c better; +2 needed to stabilize slide board at lower legs to ensure that pt's LLE was supported at all times.  Seated therapeutic exercises performed with bil LEs to increase strength for functional mobility. R/L alternating ankle pumps, R short arc quad knee extension, bil glut sets, x 15 reps.    W/c propulsion to return partway to room, on level tile, using bil UEs. Pt able to back into room and position w/c next to bed with extra time and mod cues.    Pt left resting in w/c with gait belt for safety to prevent pt from sliding forward,  and all needs within reach.   See function navigator for current status.  Azazel Franze 04/26/2018, 1:25 PM

## 2018-04-26 NOTE — Progress Notes (Signed)
Occupational Therapy Session Note  Patient Details  Name: Casey Gilmore MRN: [id number] Date of Birth: 1940/12/30  Today's Date: 04/26/2018 OT Individual Time: 7614-7092 OT Individual Time Calculation (min): 60 min    Short Term Goals: Week 1:  OT Short Term Goal 1 (Week 1): Pt will complete sit > stand with mod assist of 1 caregiver in preparation to complete LB dressing at sit > stand level OT Short Term Goal 2 (Week 1): Pt will complete bathing with mod assist OT Short Term Goal 3 (Week 1): Pt will complete toilet transfer with mod assist of 1 caregiver OT Short Term Goal 4 (Week 1): Pt will complete LB dressing with max assist and use of AE as needed  Skilled Therapeutic Interventions/Progress Updates:    Pt received sitting up in w/c agreeable to therapy. Pt extremely limited by pain throughout session. Session focused on activity tolerance, pain management, and b/d tasks. Pt washed UB at sink with set up. Pt was able to thread R UE through button up shirt but required mod A with completing dressing. Pt reports that he uses a button hook at home to assist with [facility] task. Pt required max A to thread underwear/pants, pt able to pull up clothing from knee level to just below bottom. Edu/demo provided re lateral leans to don clothing from seated level. Pt required max A to pull LB clothing over hips and increased time, with 5+ lateral leans each way required. Pt required frequent rest breaks d/t pain. Emotional support and encouragement provided throughout for pain management, as well as vc for breathing techniques. Pt left in w/c with all needs met.   Therapy Documentation Precautions:  Precautions Precautions: Fall Restrictions Weight Bearing Restrictions: Yes LLE Weight Bearing: Touchdown weight bearing Other Position/Activity Restrictions: No ROM restrictions noted in note w/ hip, however pt reports pt has been limited to ~45 deg of L hip flexion since a hip surgery he had in the  1950s    Pain: Pain Assessment Pain Scale: 0-10 Pain Score: 8  Pain Type: Surgical pain Pain Location: Hip Pain Orientation: Left Pain Descriptors / Indicators: Aching;Tender Pain Onset: On-going Pain Intervention(s): Repositioned;Emotional support;Distraction  See Function Navigator for Current Functional Status.   Therapy/Group: Individual Therapy  Curtis Sites 04/26/2018, 12:54 PM

## 2018-04-26 NOTE — Progress Notes (Signed)
Patient information reviewed and entered into eRehab system by Sula Fetterly, RN, CRRN, PPS Coordinator.  Information including medical coding and functional independence measure will be reviewed and updated through discharge.     Per nursing patient was given "Data Collection Information Summary for Patients in Inpatient Rehabilitation Facilities with attached "Privacy Act Statement-Health Care Records" upon admission.  

## 2018-04-26 NOTE — Plan of Care (Signed)
  Problem: Consults Goal: RH GENERAL PATIENT EDUCATION Description See Patient Education module for education specifics. Outcome: Progressing   Problem: RH BOWEL ELIMINATION Goal: RH STG MANAGE BOWEL WITH ASSISTANCE Description STG Manage Bowel with Min.Assistance.  Outcome: Progressing Goal: RH STG MANAGE BOWEL W/MEDICATION W/ASSISTANCE Description STG Manage Bowel with Medication with Min.Assistance.  Outcome: Progressing   Problem: RH BLADDER ELIMINATION Goal: RH STG MANAGE BLADDER WITH ASSISTANCE Description STG Manage Bladder With Min. Assistance  Outcome: Progressing   Problem: RH SKIN INTEGRITY Goal: RH STG SKIN FREE OF INFECTION/BREAKDOWN Description With min. Assist.  Outcome: Progressing Goal: RH STG MAINTAIN SKIN INTEGRITY WITH ASSISTANCE Description STG Maintain Skin Integrity With Min. Assistance.  Outcome: Progressing Goal: RH STG ABLE TO PERFORM INCISION/WOUND CARE W/ASSISTANCE Description STG Able To Perform Incision/Wound Care With Min.Assistance.  Outcome: Progressing   Problem: RH SAFETY Goal: RH STG ADHERE TO SAFETY PRECAUTIONS W/ASSISTANCE/DEVICE Description STG Adhere to Safety Precautions With Mod.Assistance/Device.  Outcome: Progressing Goal: RH STG DECREASED RISK OF FALL WITH ASSISTANCE Description STG Decreased Risk of Fall With Mod. Assistance.  Outcome: Progressing   Problem: RH PAIN MANAGEMENT Goal: RH STG PAIN MANAGED AT OR BELOW PT'S PAIN GOAL Description Less than 3,on 1 to 10 scale  Outcome: Progressing

## 2018-04-26 NOTE — Progress Notes (Signed)
Physical Therapy Session Note  Patient Details  Name: Casey Gilmore MRN: [id number] Date of Birth: October 22, 1941  Today's Date: 04/26/2018 PT Individual Time: 0800-0900 AND 1500-1600 PT Individual Time Calculation (min): 60 min AND 60 min  Short Term Goals: Week 1:  PT Short Term Goal 1 (Week 1): Pt will initiate gait training w/ LRAD PT Short Term Goal 2 (Week 1): Pt will transfer bed<>chair w/ mod assist PT Short Term Goal 3 (Week 1): Pt will initiate w/c self-propulsion training PT Short Term Goal 4 (Week 1): Pt will perform bed mobility w/ mod assist  Skilled Therapeutic Interventions/Progress Updates:   Pt received supine in bed and agreeable to PT. Supine>sit transfer with max assist assist and max cues for technique.   SB transfer to [facility] with max assist from PT . Max multimodal cues for improved UE placement pt noted attempt to reach across body with the LUE to pull across SB.   WC mobility 64f+50ft with superivision assist from PT min cues from PT for improved use of the LUE to maintain straight path.  [facility]<>[facility] able to lift ~ 5-7inches. PT blocking RLE to improve knee stabiliy, bu pt reports arthritis pain in the R knee limiting ability to stand.   [facility] and all needs met.      Session 2.   Pt received sitting in WC and agreeable to PT. [facility] to [facility] table with max-total assist. Pt noted to have have increased anxiety with transfer and poor sequencing of movement with transfer to mat table. Once on mat table pt required max assist to sit EOB due to UE fatigue and SOB. Following prolonged rest break, PT attempted to instruct pt in sit<>stand, but UE tremors and weakness prevented attempt to stand and pt noted to impulsively attempt to lie on bed. Max assist from PT to transfer to supine. Sit>supine after prolonged rest break with max  assist. [facility] due to poor sequencing of movements and to prevent fall off SB. Pt returned to room and performed total assist +2 with RN assist to prevent fall off SB. Sit>supine with total assist to manage trunk and BLE. Pt left supine in bed with all needs me.t       Therapy Documentation Precautions:  Precautions Precautions: Fall Restrictions Weight Bearing Restrictions: Yes LLE Weight Bearing: Touchdown weight bearing Other Position/Activity Restrictions: No ROM restrictions noted in note w/ hip, however pt reports pt has been limited to ~45 deg of L hip flexion since a hip surgery he had in the 1950s    Pain: Pain Assessment Pain Scale: 0-10 Pain Score: 7  Pain Type: Surgical pain Pain Location: Hip Pain Orientation: Left Pain Descriptors / Indicators: Aching;Burning;Discomfort Pain Frequency: Intermittent Pain Onset: On-going Patients Stated Pain Goal: 3 Pain Intervention(s): Medication (See eMAR)  See Function Navigator for Current Functional Status.   Therapy/Group: Individual Therapy  ALorie [facility]5/22/2019, 9:19 AM

## 2018-04-26 NOTE — Progress Notes (Signed)
[facility] PHYSICAL MEDICINE & REHABILITATION     PROGRESS NOTE  Subjective/Complaints:  Patient seen lying in bed this morning. He states he slept well overnight. He states he had a rough day of therapies yesterday stating, "moving things have not moved in years"  ROS: denies CP, SOB, nausea, vomiting, diarrhea.  Objective: Vital Signs: Blood pressure 127/60, pulse 83, temperature 99 F (37.2 C), temperature source Oral, resp. rate 18, weight 78.7 kg (173 lb 8 oz), SpO2 94 %. No results found. Recent Labs    04/24/18 1711 04/25/18 0622  WBC 7.2 6.8  HGB 9.1* 9.1*  HCT 30.9* 31.0*  PLT 293 289   Recent Labs    04/24/18 1711 04/25/18 0622  NA  --  139  K  --  4.2  CL  --  99*  GLUCOSE  --  194*  BUN  --  20  CREATININE 1.35* 1.16  CALCIUM  --  8.9   CBG (last 3)  Recent Labs    04/25/18 1650 04/25/18 2111 04/26/18 0648  GLUCAP 200* 204* 216*    Wt Readings from Last 3 Encounters:  04/24/18 78.7 kg (173 lb 8 oz)  04/22/18 81.3 kg (179 lb 3.7 oz)  01/19/18 88.5 kg (195 lb)    Physical Exam:  BP 127/60 (BP Location: Left Arm)   Pulse 83   Temp 99 F (37.2 C) (Oral)   Resp 18   Wt 78.7 kg (173 lb 8 oz)   SpO2 94%   BMI 23.53 kg/m  Constitutional: He appears well-developed and well-nourished. NAD. HENT: Normocephalic and atraumatic.  Eyes: EOM are normal. No discharge.  Cardiovascular: RRR. No JVD.  Respiratory: Effort normal and breath sounds normal.  GI: Bowel sounds are normal. He exhibits no distension.  Musculoskeletal: Left hip edema and tenderness  Neurological: He is alert and oriented.  Speech is mildly dysarthric  Motor: Bilateral upper extremities: 4+/5 proximal to distal Right lower extremity: Hip flexion, knee extension 4/5, ankle dorsiflexion 4+/5 Left lower extremity: Hip flexion, knee extension 1+/5, ankle dorsiflexion 4+/5  Skin: Surgical site with dressing C/D/I  Psychiatric: He has a normal mood and affect. His behavior is normal.    Assessment/Plan: 1. Functional deficits secondary to left hip fracture which require 3+ hours per day of interdisciplinary therapy in a comprehensive inpatient rehab setting. Physiatrist is providing close team supervision and 24 hour management of active medical problems listed below. Physiatrist and rehab team continue to assess barriers to discharge/monitor patient progress toward functional and medical goals.  Function:  Bathing Bathing position   Position: Bed  Bathing parts Body parts bathed by patient: Chest, Abdomen Body parts bathed by helper: Right arm, Abdomen, Front perineal area, Buttocks, Right upper leg, Left upper leg, Right lower leg, Left lower leg, Back  Bathing assist Assist Level: (Max assist)      Upper Body Dressing/Undressing Upper body dressing   What is the patient wearing?: Hospital gown                Upper body assist Assist Level: (total assist)      Lower Body Dressing/Undressing Lower body dressing   What is the patient wearing?: Non-skid slipper socks, Hospital Gown           Non-skid slipper socks- Performed by helper: Don/doff right sock, Don/doff left sock                  Lower body assist  Toileting Toileting          Toileting assist     Transfers Chair/bed transfer   Chair/bed transfer method: Lateral scoot Chair/bed transfer assist level: Maximal assist (Pt 25 - 49%/lift and lower) Chair/bed transfer assistive device: Sliding board     Locomotion Ambulation Ambulation activity did not occur: Safety/medical concerns         Wheelchair Wheelchair activity did not occur: Safety/medical concerns        Cognition Comprehension Comprehension assist level: Follows basic conversation/direction with no assist  Expression Expression assist level: Expresses basic needs/ideas: With no assist  Social Interaction Social Interaction assist level: Interacts appropriately 75 - 89% of the time - Needs  redirection for appropriate language or to initiate interaction.  Problem Solving Problem solving assist level: Solves basic 75 - 89% of the time/requires cueing 10 - 24% of the time  Memory Memory assist level: Recognizes or recalls 75 - 89% of the time/requires cueing 10 - 24% of the time    Medical Problem List and Plan: 1.  Decreased functional mobility secondary to left subtrochanteric femur fracture, retained hip fusion hardware placed in the 1950s and broken hardware status post ORIF 04/18/2018.  Touchdown weightbearing x8 weeks   Continue CIR 2.  DVT Prophylaxis/Anticoagulation: Subcutaneous Lovenox.     Vascular study negative 3. Pain Management: Oxycodone as needed 4. Mood: Provide emotional support 5. Neuropsych: This patient is capable of making decisions on his own behalf. 6. Skin/Wound Care: Routine skin checks 7. Fluids/Electrolytes/Nutrition: Routine in and outs    BMP within acceptable range on 5/21 8.  Acute on chronic anemia.  Continue iron supplement   Hemoglobin 9.1 on 5/21   Continue to monitor 9.  Recent fall with left humeral and radial/ulnar fractures. 10.  Type 2 diabetes mellitus.  Hemoglobin A1c 7.6.  SSI.  Check blood sugars before meals and at bedtime (on metformin 1000 twice a day, Amaryl 4 mg daily PTA).   Metformin 500 twice a day started on 5/22 11.  Hyperlipidemia.  Pravachol 12.  History of severe aortic stenosis.  Status post bioprosthetic AVR 2012. 13.  History of GI bleed.  Continue Protonix   See #8 14. Hypoalbuminemia   Supplement initiated on 5/21  LOS (Days) 2 A FACE TO FACE EVALUATION WAS PERFORMED  Maddoxx Burkitt [name] 04/26/2018 8:30 AM

## 2018-04-27 ENCOUNTER — Inpatient Hospital Stay (HOSPITAL_COMMUNITY): Payer: Medicare Other | Admitting: Physical Therapy

## 2018-04-27 ENCOUNTER — Inpatient Hospital Stay (HOSPITAL_COMMUNITY): Payer: Medicare Other | Admitting: Occupational Therapy

## 2018-04-27 DIAGNOSIS — G8918 Other acute postprocedural pain: Secondary | ICD-10-CM

## 2018-04-27 DIAGNOSIS — K5903 Drug induced constipation: Secondary | ICD-10-CM

## 2018-04-27 LAB — GLUCOSE, CAPILLARY
GLUCOSE-CAPILLARY: 270 mg/dL — AB (ref 65–99)
Glucose-Capillary: 174 mg/dL — ABNORMAL HIGH (ref 65–99)
Glucose-Capillary: 184 mg/dL — ABNORMAL HIGH (ref 65–99)
Glucose-Capillary: 177 mg/dL — ABNORMAL HIGH (ref 65–99)

## 2018-04-27 MED ORDER — SENNOSIDES-DOCUSATE SODIUM 8.6-50 MG PO TABS
1.0000 | ORAL_TABLET | Freq: Every day | ORAL | Status: DC
Start: 2018-04-27 — End: 2018-05-12
  Administered 2018-04-27 – 2018-05-06 (×8): 1 via ORAL
  Filled 2018-04-27 (×11): qty 1

## 2018-04-27 MED ORDER — OXYCODONE HCL 5 MG PO TABS
5.0000 mg | ORAL_TABLET | Freq: Four times a day (QID) | ORAL | Status: DC | PRN
  Administered 2018-04-27 – 2018-05-02 (×19): 10 mg via ORAL
  Administered 2018-05-03: 5 mg via ORAL
  Administered 2018-05-03: 10 mg via ORAL
  Administered 2018-05-03: 5 mg via ORAL
  Administered 2018-05-03 – 2018-05-10 (×24): 10 mg via ORAL
  Filled 2018-04-27 (×47): qty 2

## 2018-04-27 MED ORDER — POLYETHYLENE GLYCOL 3350 17 G PO PACK
17.0000 g | PACK | Freq: Two times a day (BID) | ORAL | Status: DC
  Administered 2018-04-27 – 2018-05-07 (×14): 17 g via ORAL
  Filled 2018-04-27 (×21): qty 1

## 2018-04-27 MED ORDER — METHOCARBAMOL 500 MG PO TABS
500.0000 mg | ORAL_TABLET | Freq: Four times a day (QID) | ORAL | Status: DC | PRN
  Administered 2018-04-27 – 2018-05-11 (×32): 500 mg via ORAL
  Filled 2018-04-27 (×32): qty 1

## 2018-04-27 NOTE — Progress Notes (Signed)
Physical Therapy Session Note  Patient Details  Name: Casey Gilmore MRN: [id number] Date of Birth: 06/20/1941  Today's Date: 04/27/2018 PT Individual Time: 1400-1500 AND 1630-1700 PT Individual Time Calculation (min): 60 min AND 30 min   Short Term Goals: Week 1:  PT Short Term Goal 1 (Week 1): Pt will initiate gait training w/ LRAD PT Short Term Goal 2 (Week 1): Pt will transfer bed<>chair w/ mod assist PT Short Term Goal 3 (Week 1): Pt will initiate w/c self-propulsion training PT Short Term Goal 4 (Week 1): Pt will perform bed mobility w/ mod assist  Skilled Therapeutic Interventions/Progress Updates:  Session 1.  Pt received supine in bed and agreeable to PT. Supine>sit transfer with total assist assist and max cues for LLE management. Sitting EOB balance/tolerance x 15 minutes with mod to supervision assist due to pain, fear of falling, and poor LLE management with ROM limitations. Sit<>stand x 1 with total assist from PT to facilitate anterior weight shift and WB through the RLE only. Standing tolerance x 2 minutes with max assist from PT and BUE support on PT.  Lateral scoot EOB with max assist from PT and support of the LLE to prevent excessive WB with scoot, PT also provided Max multimodal cues for improved sequecning of movements of BUE and trunk control. Sit>supine with max assist to manage the LLE. PT instructed pt in L hip/pelvis AAROM for flexion/extension in supine. Scooting to [facility] with use of bed features and use of RLE. Pt left supine in bed with call bell in reach and all needs met.     Session 2.   Pt received supine in bed and agreeable to PT. Pt instructed pt in bed level therex. Supine>R sidelying with max assist due to pain and to prevent LLE excessive rotation. Gravity eliminated knee flexion/extension x 15. Sidelying to supine with mod assist from PT. Ankle pumps x 20, RLE SLR x 10, RLE heel slides, R hip abduction/adduction x 10, R hip IR/ER x 15 with clam shell,  bridge through R LE x 5 with assisted Isometric L hip flexion to to prevent WB through LLE. Scoot to [facility] with min assist x 2 RUE pull on bed features, and push through RLE.   Pt left supine in bed with call bell in reach and all needs met.     Therapy Documentation Precautions:  Precautions Precautions: Fall Restrictions Weight Bearing Restrictions: Yes LLE Weight Bearing: Touchdown weight bearing Other Position/Activity Restrictions: No ROM restrictions noted in note w/ hip, however pt reports pt has been limited to ~45 deg of L hip flexion since a hip surgery he had in the 1950s Vital Signs: Therapy Vitals Temp: 98.4 F (36.9 C) Temp Source: Oral Pulse Rate: 78 Resp: 18 BP: 98/73 Patient Position (if appropriate): Lying Oxygen Therapy SpO2: 98 % O2 Device: Room Air Pain: 4/10 L hip. surgical  See Function Navigator for Current Functional Status.   Therapy/Group: Individual Therapy  [name] 04/27/2018, 5:23 PM

## 2018-04-27 NOTE — Progress Notes (Signed)
Pt has bilat boggy heels that are blanchable. Foam drsgs placed to bilat heels. Called PA Dan for order for prevalon boots. Called [employer] and will send A11 for item. Pt in bed with heels elevated on pillow at this time. Will cont to monitor.

## 2018-04-27 NOTE — Care Management (Signed)
Inpatient Rehabilitation Center Individual Statement of Services  Patient Name:  Casey Gilmore  Date:  04/27/2018  Welcome to the [facility].  Our goal is to provide you with an individualized program based on your diagnosis and situation, designed to meet your specific needs.  With this comprehensive rehabilitation program, you will be expected to participate in at least 3 hours of rehabilitation therapies Monday-Friday, with modified therapy programming on the weekends.  Your rehabilitation program will include the following services:  Physical Therapy (PT), Occupational Therapy (OT), 24 hour per day rehabilitation nursing, Neuropsychology, Case Management (Social Worker), Rehabilitation Medicine, Nutrition Services and Pharmacy Services  Weekly team conferences will be held on Wednesdays to discuss your progress.  Your Social Worker will talk with you frequently to get your input and to update you on team discussions.  Team conferences with you and your family in attendance may also be held.  Expected length of stay: 14-18 days    Overall anticipated outcome: minimal assistance  Depending on your progress and recovery, your program may change. Your Social Worker will coordinate services and will keep you informed of any changes. Your Social Worker's name and contact numbers are listed  below.  The following services may also be recommended but are not provided by the [facility] will be made to provide these services after discharge if needed.  Arrangements include referral to agencies that provide these services.  Your insurance has been verified to be:  [employer] Your primary doctor is:  Mayra Neer  Pertinent information will be shared with your doctor and your insurance company.  Social Worker:  [place], [place] or ([phone]   Information discussed with and copy given to patient by: Lennart Pall, 04/27/2018, 2:39 PM

## 2018-04-27 NOTE — Progress Notes (Signed)
Physical Therapy Session Note  Patient Details  Name: Casey Gilmore MRN: [id number] Date of Birth: 1941/03/06  Today's Date: 04/27/2018 PT Individual Time: 0800-0900 PT Individual Time Calculation (min): 60 min   Short Term Goals: Week 1:  PT Short Term Goal 1 (Week 1): Pt will initiate gait training w/ LRAD PT Short Term Goal 2 (Week 1): Pt will transfer bed<>chair w/ mod assist PT Short Term Goal 3 (Week 1): Pt will initiate w/c self-propulsion training PT Short Term Goal 4 (Week 1): Pt will perform bed mobility w/ mod assist  Skilled Therapeutic Interventions/Progress Updates:    Pt supine in bed, agreeable to PT. Pt reports 6/10 pain in L hip, receives pain medication at beginning of therapy session. Rolling L/R for brief change and pericare with max A and use of bedrails, dependent for cleanup due to incontinence of urine. Supine to semi-sitting EOB with max A. Attempt sliding board transfer bed to w/c with assist x 2: one person to assist with sliding hips on board 2nd person to assist with LLE management. Mid-transfer pt reports he is having a bowel movement. Sliding board transfer back to bed assist x 2. Rolling L/R with max A and bedrails, pt just has gas no BM at this time. Supine BLE therex x 10 reps with AAROM on LLE. Pt left supine in bed with needs in reach, B heels elevated to prevent skin breakdown, needs in reach and bed alarm in place.  Therapy Documentation Precautions:  Precautions Precautions: Fall Restrictions Weight Bearing Restrictions: Yes LLE Weight Bearing: Touchdown weight bearing Other Position/Activity Restrictions: No ROM restrictions noted in note w/ hip, however pt reports pt has been limited to ~45 deg of L hip flexion since a hip surgery he had in the 1950s  See Function Navigator for Current Functional Status.   Therapy/Group: Individual Therapy   Excell Seltzer, PT, DPT  04/27/2018, 11:39 AM

## 2018-04-27 NOTE — Plan of Care (Signed)
  Problem: Consults Goal: RH GENERAL PATIENT EDUCATION Description See Patient Education module for education specifics. Outcome: Progressing   Problem: RH BOWEL ELIMINATION Goal: RH STG MANAGE BOWEL WITH ASSISTANCE Description STG Manage Bowel with Min.Assistance.  Outcome: Progressing Goal: RH STG MANAGE BOWEL W/MEDICATION W/ASSISTANCE Description STG Manage Bowel with Medication with Min.Assistance.  Outcome: Progressing   Problem: RH BLADDER ELIMINATION Goal: RH STG MANAGE BLADDER WITH ASSISTANCE Description STG Manage Bladder With Min. Assistance  Outcome: Progressing   Problem: RH SKIN INTEGRITY Goal: RH STG SKIN FREE OF INFECTION/BREAKDOWN Description With min. Assist.  Outcome: Progressing Goal: RH STG MAINTAIN SKIN INTEGRITY WITH ASSISTANCE Description STG Maintain Skin Integrity With Min. Assistance.  Outcome: Progressing Goal: RH STG ABLE TO PERFORM INCISION/WOUND CARE W/ASSISTANCE Description STG Able To Perform Incision/Wound Care With Min.Assistance.  Outcome: Progressing   Problem: RH SAFETY Goal: RH STG ADHERE TO SAFETY PRECAUTIONS W/ASSISTANCE/DEVICE Description STG Adhere to Safety Precautions With Mod.Assistance/Device.  Outcome: Progressing Goal: RH STG DECREASED RISK OF FALL WITH ASSISTANCE Description STG Decreased Risk of Fall With Mod. Assistance.  Outcome: Progressing   Problem: RH PAIN MANAGEMENT Goal: RH STG PAIN MANAGED AT OR BELOW PT'S PAIN GOAL Description Less than 3,on 1 to 10 scale  Outcome: Progressing

## 2018-04-27 NOTE — Progress Notes (Signed)
Social Work Patient ID: Casey Gilmore, male   DOB: [birth year], 77 y.o.   MRN: [id number]   Have reviewed team conference with pt who is aware and agreeable with targeted d/c date of 05/11/18.  Aware of min assistance goals and that I need to determine if his family can provide this.  Await call back from son.  Danta Baumgardner, LCSW

## 2018-04-27 NOTE — Progress Notes (Signed)
Social Work  Social Work Assessment and Plan  Patient Details  Name: Casey Gilmore MRN: [id number] Date of Birth: 03-01-1941  Today's Date: 04/27/2018  Problem List:  Patient Active Problem List   Diagnosis Date Noted  . Constipation due to pain medication   . Postoperative pain   . Hypoalbuminemia due to protein-calorie malnutrition ([place])   . Closed left subtrochanteric femur fracture ([place]) 04/24/2018  . Acute blood loss anemia   . Anemia of chronic disease   . Diabetes mellitus type 2 in nonobese ([facility])   . Hyperlipidemia   . Aortic valve stenosis   . History of GI bleed   . Anemia 04/17/2018  . Closed left hip fracture ([place]) 04/15/2018  . Closed fracture of left proximal humerus 01/19/2018  . Closed fracture of left distal radius and ulna 01/19/2018  . 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 Chronic diastolic heart failure ([place]) 06/24/2014  . Benign essential HTN 06/24/2014  . 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 History of non-ST elevation myocardial infarction (NSTEMI) 06/24/2014  . Diabetes mellitus ([place]) 10/17/2011  . Osteoarthritis 10/17/2011   Past Medical History:  Past Medical History:  Diagnosis Date  . Anemia    2012  . Arthritis   . Blood transfusion   . 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 Closed fracture of left distal radius and ulna 01/19/2018  . Closed fracture of left proximal humerus 01/19/2018  . COPD (chronic obstructive pulmonary disease) ([place])   . Diabetes mellitus   . GERD (gastroesophageal reflux disease)   . Heart murmur   . Hiatal hernia   . Hyperlipidemia   . Hypertension   . NSTEMI (non-ST elevated myocardial infarction) ([place]) 07/2011   in setting of Pneumonia with normal coronary arteries on cath  . Pneumonia hx 8/12  . Severe aortic stenosis 2012   s/p bioprosthetic AVR  . Shortness of breath    with exertion  . Upper GI  bleeding    Past Surgical History:  Past Surgical History:  Procedure Laterality Date  . AORTIC VALVE REPLACEMENT  10/15/2011   Procedure: AORTIC VALVE REPLACEMENT (AVR);  Surgeon: Tharon Aquas Adelene Idler, MD;  Location: [facility];  Service: Open Heart Surgery;  Laterality: N/A;  . CARDIAC CATHETERIZATION  results on chart   2012 w no significant disease,echo with Severe Aortic stenosis,LVH,Diastolic dysfunction.   . COLONOSCOPY  12/2004  . EYE SURGERY  bil cat 08  . FRACTURE SURGERY  pin in lft hip  . JOINT REPLACEMENT  left knee 08, rt hip 08  . LARYNGOSCOPY  01/05/2013   Procedure: LARYNGOSCOPY;  Surgeon: Melida Quitter, MD;  Location: [facility];  Service: ENT;  Laterality: N/A;  micro direct laryngoscopy with biopsy, possible co2 laser, small laser-safe endotracheal tube  . LARYNGOSCOPY N/A 02/08/2013   Procedure: LARYNGOSCOPY;  Surgeon: Melida Quitter, MD;  Location: [facility];  Service: ENT;  Laterality: N/A;  Micro Direct Laryngoscopy with Co2 laser with re-excision right vocal cord lesion.  . OPEN REDUCTION INTERNAL FIXATION (ORIF) DISTAL RADIAL FRACTURE Left 01/19/2018   Procedure: OPEN REDUCTION INTERNAL FIXATION (ORIF) DISTAL RADIAL FRACTURE;  Surgeon: Marchia Bond, MD;  Location: [facility];  Service: Orthopedics;  Laterality: Left;  . ORIF FEMUR FRACTURE Left 04/18/2018   Procedure: OPEN REDUCTION INTERNAL FIXATION PROXIMAL FEMORAL SHAFT FRACTURE...REMOVAL OF HARDWARE LEFT FEMUR (HIP FUSION PLATE);  Surgeon: Altamese Johnstown, MD;  Location: [facility];  Service: Orthopedics;  Laterality: Left;  . ORIF HUMERUS FRACTURE Left 01/19/2018   Procedure: OPEN REDUCTION INTERNAL FIXATION (ORIF) PROXIMAL HUMERUS FRACTURE;  Surgeon: Marchia Bond, MD;  Location: [facility];  Service: Orthopedics;  Laterality: Left;  . TONSILLECTOMY    . TOTAL HIP ARTHROPLASTY Right   . TOTAL KNEE ARTHROPLASTY Left   . 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 His smoking use included cigarettes. He has a 50.00 pack-year smoking history. He has never used smokeless tobacco. He reports that he does not drink alcohol or use drugs.  Family / Support Systems Marital Status: Divorced Patient Roles: Parent Children: Son, Hezakiah Champeau (next door to pt) @ (C) [phone];  son, Hassel Uphoff (local) @ (C) [phone] Other Supports: brother living close by Anticipated Caregiver: Sons and brother Ability/Limitations of Caregiver: Sons works; brother can see patient daily and assist. Caregiver Availability: Intermittent Family Dynamics: Pt describes family as very supportive and close relationship with sons.  Social History Preferred language: English Religion: Presbyterian Cultural Background: NA Read: Yes Write: Yes Employment Status: Retired Freight forwarder Issues: None Guardian/Conservator: None - per MD, pt is capable of making decisions on his own behalf   Abuse/Neglect Abuse/Neglect Assessment Can Be Completed: Yes Physical Abuse: Denies Verbal Abuse: Denies Sexual Abuse: Denies Exploitation of patient/patient's resources: Denies Self-Neglect: Denies  Emotional Status Pt's affect, behavior adn adjustment status: Pt very pleasant and motivated for therapies, however, he states, "they're moving my hip in ways that haven't happended for years."  He worries that he "will be a failure" on CIR but states he is motivated for the program.  Pt denies any significant emotional distress, however, will monitor and refer for neuropsychology if indicated. Recent Psychosocial Issues: very recent fall with shoulder injury in Feb but was able to care for himself overall at independent level Pyschiatric History: None Substance Abuse History: None  Patient / Family Perceptions, Expectations & Goals Pt/Family understanding of illness & functional limitations: Pt reports "I shattered my hip".  Does have a  good understanding of his TDWB restrictions.  Awaiting sons to contact me to discuss his care needs. Premorbid pt/family roles/activities: Pt was independent overall and living alone with family very close by.  He reports that he saw family on a daily basis. Anticipated changes in roles/activities/participation: Given min assist goals, family will need to assume f/t caregiver roles if they are able. Pt/family expectations/goals: "I just hope I get so much better that I'll be jumping off the bed and not falling out of it."  [employer]: None Premorbid Home Care/DME Agencies: Other (Comment)(had HH after shoulder injury but pt cannot recal agency) Transportation available at discharge: yes  Discharge Planning Living Arrangements: Alone Support Systems: Children, Other relatives Type of Residence: Private residence Insurance Resources: Medicare(BCBS Medicare) Financial Resources: Social Security Financial Screen Referred: No Living Expenses: Own Money Management: Patient Does the patient have any problems obtaining your medications?: No Home Management: pt managed his own home Patient/Family Preliminary Plans: Await further discussion with pt's sons to determine realistic d/c plan.  If family unable to provid 24/7 support then may need to consider SNF. Social Work Anticipated Follow Up Needs: HH/OP, SNF Expected length of stay: 14-18 days  Clinical Impression Pleasant, elderly gentleman here following a fall at home with hip fx (and significant hip limitations due to surgery years ago.)  Family  very supportive and already check on pt daily, however, goals are set for min assist and need to determine if family can increase support to 24/7.  Pt denies any s/s of emotional distress but does note the he "don't want to be a failure here."  SW to follow for support and d/c planning needs.  Berneice Zettlemoyer 04/27/2018, 2:00 PM

## 2018-04-27 NOTE — Progress Notes (Signed)
Occupational Therapy Session Note  Patient Details  Name: Casey Gilmore MRN: [id number] Date of Birth: 1941/09/26  Today's Date: 04/27/2018 OT Individual Time: 9794-8016 OT Individual Time Calculation (min): 42 min   Short Term Goals: Week 1:  OT Short Term Goal 1 (Week 1): Pt will complete sit > stand with mod assist of 1 caregiver in preparation to complete LB dressing at sit > stand level OT Short Term Goal 2 (Week 1): Pt will complete bathing with mod assist OT Short Term Goal 3 (Week 1): Pt will complete toilet transfer with mod assist of 1 caregiver OT Short Term Goal 4 (Week 1): Pt will complete LB dressing with max assist and use of AE as needed  Skilled Therapeutic Interventions/Progress Updates:    Pt greeted semi-reclined in bed and agreeable to OT treatment session. Pt agreeable to try to sit EOB with OT. Pt came to sitting EOB with Max A to advance LLE high pain and increased time. Pt fearful of weight shifting onto L hip 2/2 pain. Pt sits with L hip extended and leaning onto R elbow. Tried to engage pt in B UE task of changing shirt but pt unable to remove Unilateral UE and maintain sitting. Total A required to change shirt in sitting. Pt tolerated sitting EOB for ~ 25 mins. Pt returned to bed with Max A, increased time and high pain in L hip. Pt left with bed alarm on and heels floated on pillows.   Therapy Documentation Precautions:  Precautions Precautions: Fall Restrictions Weight Bearing Restrictions: Yes LLE Weight Bearing: Touchdown weight bearing Other Position/Activity Restrictions: No ROM restrictions noted in note w/ hip, however pt reports pt has been limited to ~45 deg of L hip flexion since a hip surgery he had in the 1950s Pain: Pain Assessment Pain Scale: 0-10 Pain Score: 9  Pain Type: Surgical pain Pain Location: Hip Pain Orientation: Left Pain Descriptors / Indicators: Aching Pain Onset: With Activity Patients Stated Pain Goal: 3 Pain  Intervention(s): Repositioned  See Function Navigator for Current Functional Status.   Therapy/Group: Individual Therapy  Valma Cava 04/27/2018, 12:22 PM

## 2018-04-27 NOTE — Progress Notes (Signed)
[place] PHYSICAL MEDICINE & REHABILITATION     PROGRESS NOTE  Subjective/Complaints:  Seen lying in bed this morning. He states he slept well overnight. Informed by nursing regarding constipation.  ROS: denies CP, SOB, nausea, vomiting, diarrhea.  Objective: Vital Signs: Blood pressure 128/64, pulse 84, temperature 98.7 F (37.1 C), temperature source Oral, resp. rate 18, weight 78.7 kg (173 lb 8 oz), SpO2 100 %. No results found. Recent Labs    04/24/18 1711 04/25/18 0622  WBC 7.2 6.8  HGB 9.1* 9.1*  HCT 30.9* 31.0*  PLT 293 289   Recent Labs    04/24/18 1711 04/25/18 0622  NA  --  139  K  --  4.2  CL  --  99*  GLUCOSE  --  194*  BUN  --  20  CREATININE 1.35* 1.16  CALCIUM  --  8.9   CBG (last 3)  Recent Labs    04/26/18 1616 04/26/18 2120 04/27/18 0640  GLUCAP 212* 217* 174*    Wt Readings from Last 3 Encounters:  04/24/18 78.7 kg (173 lb 8 oz)  04/22/18 81.3 kg (179 lb 3.7 oz)  01/19/18 88.5 kg (195 lb)    Physical Exam:  BP 128/64 (BP Location: Left Arm)   Pulse 84   Temp 98.7 F (37.1 C) (Oral)   Resp 18   Wt 78.7 kg (173 lb 8 oz)   SpO2 100%   BMI 23.53 kg/m  Constitutional: He appears well-developed and well-nourished. NAD. HENT: Normocephalic and atraumatic.  Eyes: EOM are normal. No discharge.  Cardiovascular: RRR. No JVD.  Respiratory: Effort normal and breath sounds normal.  GI: Bowel sounds are normal. He exhibits no distension.  Musculoskeletal: Left hip edema and tenderness  Neurological: He is alert and oriented.  Speech is mildly dysarthric  Motor: Bilateral upper extremities: 4+/5 proximal to distal Right lower extremity: Hip flexion, knee extension 4/5, ankle dorsiflexion 4+/5 Left lower extremity: Hip flexion, knee extension 1+/5, ankle dorsiflexion 4+/5 (unchanged) Skin: Surgical site with dressing C/D/I  Psychiatric: He has a normal mood and affect. His behavior is normal.   Assessment/Plan: 1. Functional deficits  secondary to left hip fracture which require 3+ hours per day of interdisciplinary therapy in a comprehensive inpatient rehab setting. Physiatrist is providing close team supervision and 24 hour management of active medical problems listed below. Physiatrist and rehab team continue to assess barriers to discharge/monitor patient progress toward functional and medical goals.  Function:  Bathing Bathing position   Position: Wheelchair/chair at sink  Bathing parts Body parts bathed by patient: Chest, Abdomen, Right arm, Left arm, Front perineal area, Right upper leg, Left upper leg Body parts bathed by helper: Left lower leg, Right lower leg, Back  Bathing assist Assist Level: Touching or steadying assistance(Pt > 75%)      Upper Body Dressing/Undressing Upper body dressing   What is the patient wearing?: Button up shirt         Button up shirt - Perfomed by patient: Thread/unthread right sleeve Button up shirt - Perfomed by helper: Thread/unthread left sleeve, Pull shirt around back, Button/unbutton shirt    Upper body assist Assist Level: Touching or steadying assistance(Pt > 75%)      Lower Body Dressing/Undressing Lower body dressing   What is the patient wearing?: Underwear, Pants   Underwear - Performed by helper: Thread/unthread right underwear leg, Thread/unthread left underwear leg, Pull underwear up/down   Pants- Performed by helper: Thread/unthread right pants leg, Thread/unthread left pants leg, Pull  pants up/down   Non-skid slipper socks- Performed by helper: Don/doff right sock, Don/doff left sock                  Lower body assist Assist for lower body dressing: Touching or steadying assistance (Pt > 75%)(max A)      Toileting Toileting          Toileting assist     Transfers Chair/bed transfer   Chair/bed transfer method: Lateral scoot Chair/bed transfer assist level: Maximal assist (Pt 25 - 49%/lift and lower) Chair/bed transfer assistive  device: Sliding board     Locomotion Ambulation Ambulation activity did not occur: Safety/medical concerns         Wheelchair Wheelchair activity did not occur: Safety/medical concerns Type: Manual Max wheelchair distance: 70 Assist Level: Supervision or verbal cues  Cognition Comprehension Comprehension assist level: Follows basic conversation/direction with no assist  Expression Expression assist level: Expresses complex ideas: With no assist  Social Interaction Social Interaction assist level: Interacts appropriately 75 - 89% of the time - Needs redirection for appropriate language or to initiate interaction.  Problem Solving Problem solving assist level: Solves basic 75 - 89% of the time/requires cueing 10 - 24% of the time  Memory Memory assist level: Recognizes or recalls 75 - 89% of the time/requires cueing 10 - 24% of the time    Medical Problem List and Plan: 1.  Decreased functional mobility secondary to left subtrochanteric femur fracture, retained hip fusion hardware placed in the 1950s and broken hardware status post ORIF 04/18/2018.  Touchdown weightbearing x8 weeks   Continue CIR 2.  DVT Prophylaxis/Anticoagulation: Subcutaneous Lovenox.     Vascular study negative 3. Pain Management: Oxycodone as needed, frequency decreased on 5/23  Robaxin when necessary started on 5/23 4. Mood: Provide emotional support 5. Neuropsych: This patient is capable of making decisions on his own behalf. 6. Skin/Wound Care: Routine skin checks 7. Fluids/Electrolytes/Nutrition: Routine in and outs    BMP within acceptable range on 5/21 8.  Acute on chronic anemia.  Continue iron supplement   Hemoglobin 9.1 on 5/21   Continue to monitor 9.  Recent fall with left humeral and radial/ulnar fractures. 10.  Type 2 diabetes mellitus.  Hemoglobin A1c 7.6.  SSI.  Check blood sugars before meals and at bedtime (on metformin 1000 twice a day, Amaryl 4 mg daily PTA).   Metformin 500 twice a day  started on 5/22   Remains elevated, we'll consider further increase tomorrow 11.  Hyperlipidemia.  Pravachol 12.  History of severe aortic stenosis.  Status post bioprosthetic AVR 2012. 13.  History of GI bleed.  Continue Protonix   See #8 14. Hypoalbuminemia   Supplement initiated on 5/21 15. Constipation  Bowel regimens increased on 5/23  LOS (Days) 3 A FACE TO FACE EVALUATION WAS PERFORMED  Casey Gilmore 04/27/2018 9:02 AM

## 2018-04-27 NOTE — Patient Care Conference (Signed)
Inpatient RehabilitationTeam Conference and Plan of Care Update Date: 04/26/2018   Time: 2:45 PM    Patient Name: Casey Gilmore      Medical Record Number: [id number]  Date of Birth: [birth year] Sex: Male         Room/Bed: 4M04C/4M04C-01 Payor Info: Payor: [employer] MEDICARE / Plan: BCBS MEDICARE / Product Type: *No Product type* /    Admitting Diagnosis: L hip FX  Admit Date/Time:  04/24/2018  3:45 PM Admission Comments: No comment available   Primary Diagnosis:  <principal problem not specified> Principal Problem: <principal problem not specified>  Patient Active Problem List   Diagnosis Date Noted  . Constipation due to pain medication   . Postoperative pain   . Hypoalbuminemia due to protein-calorie malnutrition ([place])   . Closed left subtrochanteric femur fracture ([place]) 04/24/2018  . Acute blood loss anemia   . Anemia of chronic disease   . Diabetes mellitus type 2 in nonobese ([facility])   . Hyperlipidemia   . Aortic valve stenosis   . History of GI bleed   . Anemia 04/17/2018  . Closed left hip fracture ([place]) 04/15/2018  . Closed fracture of left proximal humerus 01/19/2018  . Closed fracture of left distal radius and ulna 01/19/2018  . 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 Chronic diastolic heart failure ([place]) 06/24/2014  . Benign essential HTN 06/24/2014  . 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 History of non-ST elevation myocardial infarction (NSTEMI) 06/24/2014  . Diabetes mellitus ([place]) 10/17/2011  . Osteoarthritis 10/17/2011    Expected Discharge Date: Expected Discharge Date: 05/11/18(vs possible SNF)  Team Members Present: Social Worker Present: Lennart Pall, LCSW Nurse Present: Isla Pence, RN PT Present: Phylliss Bob, PTA;Barrie Folk, PT OT Present: [name], OT SLP Present: Windell Moulding, SLP PPS Coordinator present : Daiva Nakayama,  RN, CRRN     Current Status/Progress Goal Weekly Team Focus  Medical   Decreased functional mobility secondary to left subtrochanteric femur fracture, retained hip fusion hardware placed in the 1950s and broken hardware status post ORIF 04/18/2018.  Touchdown weightbearing x8 weeks  Improve mobility, transfers, DM  See above   Bowel/Bladder   continent of b/b, constipation giving Sorbitol. LBM 04/15/18   Pt will remain continent of b/b with normal bowel pattern with mod assist  assist with toileting q2h and prn, laxatives and stool softners prn    Swallow/Nutrition/ Hydration             ADL's   Pt can complete UB bathing with min A, LB bathing with max A. Pt working on lateral leans to complete LB dressing from seated level.   Continue working on b/d tasks, ADL transfers  B/d tasks, ADL transfers, pain management strategies, activity tolerance   Mobility   total-max assist sit<>supine. total assist SB trasnfer. min assist WC mobility in Reclining back WC due to restricted hip ROM  Min assist bed mobility, transfers, and supervision assist WC mobility   improved independence with transfers, bed mobility, and upright sitting/standing tolerance.    Communication             Safety/Cognition/ Behavioral Observations            Pain   patient pain relieved with current regimen. Pain 2/10 after medication. Pt oxycodone IR prn and tylenol prn  pt will have pain <=2/10  assess pain qshift and prn medicate as ordered notify  MD for unrelieved pain   Skin   Left hip surgical dressing aquacell, bruises on left flank and hand, right heel is soft and pink abdomen bruised.   surgical incision free of infection or complication no further skin breakdown and healing of current issues  assess skin qshift and prn     Rehab Goals Patient on target to meet rehab goals: Yes *See Care Plan and progress notes for long and short-term goals.     Barriers to Discharge  Current Status/Progress Possible  Resolutions Date Resolved   Physician    Medical stability;Decreased caregiver support;Inaccessible home environment;Home environment access/layout;Lack of/limited family support;Wound Care     See above  Therapies, follow labs, optimize DM meds      Nursing                  PT  Inaccessible home environment;Decreased caregiver support;Lack of/limited family support;Weight bearing restrictions  Caregivers able to assist intermittently on a daily basis (son and brother), 4 steps to enter home              OT Decreased caregiver support;Weight bearing restrictions                SLP                SW                Discharge Planning/Teaching Needs:  Home alone with family only able to provide intermittent assistance.      Team Discussion:  No significant medical concerns except bowel issues.  Was max assist bed mobility and tfs on eval and at times needed +2.  Very limited hip flexion due to h/o hip fusion years ago.  SW concerned about whether family can provide needed 24/ 7 assistance.  Awaiting contact from son.  Revisions to Treatment Plan:  None    Continued Need for Acute Rehabilitation Level of Care: The patient requires daily medical management by a physician with specialized training in physical medicine and rehabilitation for the following conditions: Daily direction of a multidisciplinary physical rehabilitation program to ensure safe treatment while eliciting the highest outcome that is of practical value to the patient.: Yes Daily medical management of patient stability for increased activity during participation in an intensive rehabilitation regime.: Yes Daily analysis of laboratory values and/or radiology reports with any subsequent need for medication adjustment of medical intervention for : Post surgical problems;Diabetes problems  Rain Friedt 04/27/2018, 3:43 PM

## 2018-04-28 ENCOUNTER — Inpatient Hospital Stay (HOSPITAL_COMMUNITY): Payer: Medicare Other | Admitting: Physical Therapy

## 2018-04-28 ENCOUNTER — Inpatient Hospital Stay (HOSPITAL_COMMUNITY): Payer: Medicare Other | Admitting: Occupational Therapy

## 2018-04-28 LAB — GLUCOSE, CAPILLARY
GLUCOSE-CAPILLARY: 170 mg/dL — AB (ref 65–99)
GLUCOSE-CAPILLARY: 194 mg/dL — AB (ref 65–99)
Glucose-Capillary: 178 mg/dL — ABNORMAL HIGH (ref 65–99)
Glucose-Capillary: 192 mg/dL — ABNORMAL HIGH (ref 65–99)

## 2018-04-28 MED ORDER — BISACODYL 10 MG RE SUPP: 10.0000 mg | Freq: Every day | RECTAL | Status: DC | PRN

## 2018-04-28 MED ORDER — METFORMIN HCL 500 MG PO TABS
1000.0000 mg | ORAL_TABLET | Freq: Two times a day (BID) | ORAL | Status: DC
  Administered 2018-04-28 – 2018-05-12 (×28): 1000 mg via ORAL
  Filled 2018-04-28 (×28): qty 2

## 2018-04-28 NOTE — Progress Notes (Signed)
Occupational Therapy Session Note  Patient Details  Name: Casey Gilmore MRN: [id number] Date of Birth: [birth year]  Today's Date: 04/28/2018 OT Individual Time: 2952-8413 OT Individual Time Calculation (min): 60 min    Short Term Goals: Week 1:  OT Short Term Goal 1 (Week 1): Pt will complete sit > stand with mod assist of 1 caregiver in preparation to complete LB dressing at sit > stand level OT Short Term Goal 2 (Week 1): Pt will complete bathing with mod assist OT Short Term Goal 3 (Week 1): Pt will complete toilet transfer with mod assist of 1 caregiver OT Short Term Goal 4 (Week 1): Pt will complete LB dressing with max assist and use of AE as needed  Skilled Therapeutic Interventions/Progress Updates:    Treatment session with focus on ADL retraining with bed mobility, dynamic sitting balance, and sit > stand.  Pt received supine in bed using urinal with setup from nursing staff.  Pt completed hygiene at bed level with assist to wash buttocks and mod assist for rolling with increased time to allow pt to initiate movement to decrease onset of pain.  Pt pulled pants over hip on Rt utilizing bridging technique but required assist to pull pants up on Lt side with rolling.  Min assist bed mobility to Lt with increased time and therapist managing LLE for controlled decent and pt utilizing bed rails to slowly sit up on EOB.  Pt posture is leaned to Rt on elbow due to fused Lt hip and shortening through Lt trunk.  Pt able to wash UB with increased time and cues for technique, however required max assist for UB dressing due to posture and decreased weight shift.  Engaged in sit > stand x1 from EOB with +2 assist, with 2nd person providing support on LUE to promote weight shift towards midline while therapist provided max assist for initial lift faded to min assist once upright. Pt able to tolerate standing 30-40 seconds before returning to sitting.  Pt reports increased pain in LLE/hip post sit <>  stand and returned to supine in bed with assist to lift BLE.  Therapy Documentation Precautions:  Precautions Precautions: Fall Restrictions Weight Bearing Restrictions: Yes LLE Weight Bearing: Touchdown weight bearing Other Position/Activity Restrictions: No ROM restrictions noted in note w/ hip, however pt reports pt has been limited to ~45 deg of L hip flexion since a hip surgery he had in the 1950s General:   Vital Signs: Therapy Vitals Temp: 98.7 F (37.1 C) Temp Source: Oral Pulse Rate: 72 BP: 114/64 Oxygen Therapy SpO2: 95 % Pain: Pain Assessment Pain Scale: Faces Faces Pain Scale: Hurts even more  See Function Navigator for Current Functional Status.   Therapy/Group: Individual Therapy  [name] 04/28/2018, 10:06 AM

## 2018-04-28 NOTE — Progress Notes (Signed)
Physical Therapy Session Note  Patient Details  Name: Casey Gilmore MRN: [id number] Date of Birth: [birth year]  Today's Date: 04/28/2018 PT Individual Time: 1400-1500 PT Individual Time Calculation (min): 60 min   Short Term Goals: Week 1:  PT Short Term Goal 1 (Week 1): Pt will initiate gait training w/ LRAD PT Short Term Goal 2 (Week 1): Pt will transfer bed<>chair w/ mod assist PT Short Term Goal 3 (Week 1): Pt will initiate w/c self-propulsion training PT Short Term Goal 4 (Week 1): Pt will perform bed mobility w/ mod assist  Skilled Therapeutic Interventions/Progress Updates:   Pt received sitting in WC and agreeable to PT  WC mobility x 26f and x 560fwith min assist from PT To maintain straight path and improved ability to make R hand turn.   UE therex.  3lb bar weight ball tap 3 x 1 min  3lb bar weight chest press x 20  Small blue ball diagonals and lateral trunk rotation x 10 in all 4 directions with min verbal cues for full ROM and proper speed.    RLE therex.  LAQ, hip flexion, hip abduction each completed x 12 with cues from PT for improved ROM   Pt refused attempt to stand in parallel bars from reported constipation pain.   SB transfer to bed with Max assist +2. Max cues for RUE positioning to allow improved force through UE and improve safety of transfer. Sit>supine with max assist +2 to control shoulders and LLE.   Pt left supine in bed with call bell in reach and all needs met.      Therapy Documentation Precautions:  Precautions Precautions: Fall Restrictions Weight Bearing Restrictions: Yes LLE Weight Bearing: Touchdown weight bearing Other Position/Activity Restrictions: No ROM restrictions noted in note w/ hip, however pt reports pt has been limited to ~45 deg of L hip flexion since a hip surgery he had in the 1950s Pain: 6/10 LLE. Surgical .   See Function Navigator for Current Functional Status.   Therapy/Group: Individual Therapy  AuLorie Phenix/24/2019, 5:27 PM

## 2018-04-28 NOTE — Progress Notes (Signed)
Physical Therapy Session Note  Patient Details  Name: Casey Gilmore MRN: [id number] Date of Birth: 02/22/1941  Today's Date: 04/28/2018 PT Individual Time: 1000-1110 PT Individual Time Calculation (min): 70 min   Short Term Goals: Week 1:  PT Short Term Goal 1 (Week 1): Pt will initiate gait training w/ LRAD PT Short Term Goal 2 (Week 1): Pt will transfer bed<>chair w/ mod assist PT Short Term Goal 3 (Week 1): Pt will initiate w/c self-propulsion training PT Short Term Goal 4 (Week 1): Pt will perform bed mobility w/ mod assist  Skilled Therapeutic Interventions/Progress Updates:    Pt supine in bed, agreeable to PT. Pt reports pain in L hip, not rated. Supine to sitting EOB with mod A for LLE management and trunk control. Pt needs increased time and encouragement to complete transfer 2/2 to pain. Sit to stand to RW with assist x 2, pt is exhibits posterior lean in standing and needs mod A to maintain standing balance and keep RW on floor. Pt tolerates standing about 1 min before onset of fatigue and anxiety require him to sit. Pt exhibits poor eccentric control when sitting. Sliding board transfer bed to w/c with assist x 2, one person to assist with sliding hips across board and one to support LLE. Pt requires increased time and encouragement to complete transfer safely 2/2 to pain. Pt agreeable to sit up in w/c, left seated in w/c in with needs in reach, quick release belt and chair alarm in place.  Therapy Documentation Precautions:  Precautions Precautions: Fall Restrictions Weight Bearing Restrictions: Yes LLE Weight Bearing: Touchdown weight bearing Other Position/Activity Restrictions: No ROM restrictions noted in note w/ hip, however pt reports pt has been limited to ~45 deg of L hip flexion since a hip surgery he had in the 1950s  See Function Navigator for Current Functional Status.   Therapy/Group: Individual Therapy  Excell Seltzer, PT, DPT  04/28/2018, 12:26 PM

## 2018-04-28 NOTE — Progress Notes (Signed)
Social Work Patient ID: Casey Gilmore, male   DOB: [birth year], 77 y.o.   MRN: [id number]  Spoke with pt's son, [name], this morning and explained that team has goals of min assist with target d/c date 6/6.  Son states that he will need to speak with his brother to see if 24/7 can be provided/ arranged.  Son states, "he's walked for years on that bum hip that has never moved.  I think he just needs to understand he's going to have to work harder."  Attempted to explain TDWB and how this affects his mobility as well.  Son plans to be here over the weekend and hopes to see some PT.  He is to follow up with me next week about coverage at d/c.  [name], LCSW

## 2018-04-28 NOTE — Progress Notes (Signed)
[place] PHYSICAL MEDICINE & REHABILITATION     PROGRESS NOTE  Subjective/Complaints:  Patient seen lying in bed this morning. He states he slept well overnight.  ROS: Denies CP, SOB, nausea, vomiting, diarrhea.  Objective: Vital Signs: Blood pressure 114/64, pulse 72, temperature 98.7 F (37.1 C), temperature source Oral, resp. rate 18, weight 78.7 kg (173 lb 8 oz), SpO2 95 %. No results found. No results for input(s): WBC, HGB, HCT, PLT in the last 72 hours. No results for input(s): NA, K, CL, GLUCOSE, BUN, CREATININE, CALCIUM in the last 72 hours.  Invalid input(s): CO CBG (last 3)  Recent Labs    04/27/18 1649 04/27/18 2126 04/28/18 0650  GLUCAP 177* 270* 170*    Wt Readings from Last 3 Encounters:  04/24/18 78.7 kg (173 lb 8 oz)  04/22/18 81.3 kg (179 lb 3.7 oz)  01/19/18 88.5 kg (195 lb)    Physical Exam:  BP 114/64   Pulse 72   Temp 98.7 F (37.1 C) (Oral)   Resp 18   Wt 78.7 kg (173 lb 8 oz)   SpO2 95%   BMI 23.53 kg/m  Constitutional: He appears well-developed and well-nourished. NAD. HENT: Normocephalic and atraumatic.  Eyes: EOM are normal. No discharge.  Cardiovascular: RRR. No JVD.  Respiratory: Effort normal and breath sounds normal.  GI: Bowel sounds are normal. He exhibits no distension.  Musculoskeletal: Left hip edema and tenderness  Neurological: He is alert and oriented.  Motor: Bilateral upper extremities: 4+/5 proximal to distal Right lower extremity: Hip flexion, knee extension 4/5, ankle dorsiflexion 4+/5 Left lower extremity: Hip flexion, knee extension 1+/5, ankle dorsiflexion 4+/5 (stable) Skin: Surgical site with C/D/I  Psychiatric: He has a normal mood and affect. His behavior is normal.   Assessment/Plan: 1. Functional deficits secondary to left hip fracture which require 3+ hours per day of interdisciplinary therapy in a comprehensive inpatient rehab setting. Physiatrist is providing close team supervision and 24 hour  management of active medical problems listed below. Physiatrist and rehab team continue to assess barriers to discharge/monitor patient progress toward functional and medical goals.  Function:  Bathing Bathing position   Position: Wheelchair/chair at sink  Bathing parts Body parts bathed by patient: Chest, Abdomen, Right arm, Left arm, Front perineal area, Right upper leg, Left upper leg Body parts bathed by helper: Left lower leg, Right lower leg, Back  Bathing assist Assist Level: Touching or steadying assistance(Pt > 75%)      Upper Body Dressing/Undressing Upper body dressing   What is the patient wearing?: Button up shirt         Button up shirt - Perfomed by patient: Thread/unthread right sleeve Button up shirt - Perfomed by helper: Thread/unthread left sleeve, Pull shirt around back, Button/unbutton shirt    Upper body assist Assist Level: Touching or steadying assistance(Pt > 75%)(max assist overall, 25% by patient)      Lower Body Dressing/Undressing Lower body dressing   What is the patient wearing?: Underwear, Pants   Underwear - Performed by helper: Thread/unthread right underwear leg, Thread/unthread left underwear leg, Pull underwear up/down   Pants- Performed by helper: Thread/unthread right pants leg, Thread/unthread left pants leg, Pull pants up/down   Non-skid slipper socks- Performed by helper: Don/doff right sock, Don/doff left sock                  Lower body assist Assist for lower body dressing: (total assist, helper completed all tasks)      Toileting Toileting  Toileting steps completed by patient: Adjust clothing prior to toileting, Performs perineal hygiene Toileting steps completed by helper: Adjust clothing after toileting(per Jobelle Mesias, NT)    Toileting assist Assist level: Touching or steadying assistance (Pt.75%)(per Leann Nickles-Wright, NT)   Transfers Chair/bed transfer   Chair/bed transfer method: Lateral  scoot Chair/bed transfer assist level: 2 helpers Chair/bed transfer assistive device: Sliding board     Locomotion Ambulation Ambulation activity did not occur: Safety/medical Editor, commissioning activity did not occur: Safety/medical concerns Type: Manual Max wheelchair distance: 70 Assist Level: Supervision or verbal cues  Cognition Comprehension Comprehension assist level: Follows basic conversation/direction with no assist  Expression Expression assist level: Expresses complex ideas: With no assist  Social Interaction Social Interaction assist level: Interacts appropriately 75 - 89% of the time - Needs redirection for appropriate language or to initiate interaction.  Problem Solving Problem solving assist level: Solves basic 90% of the time/requires cueing < 10% of the time  Memory Memory assist level: Recognizes or recalls 75 - 89% of the time/requires cueing 10 - 24% of the time    Medical Problem List and Plan: 1.  Decreased functional mobility secondary to left subtrochanteric femur fracture, retained hip fusion hardware placed in the 1950s and broken hardware status post ORIF 04/18/2018.  Touchdown weightbearing x8 weeks   Continue CIR   Plan to DC staples next week 2.  DVT Prophylaxis/Anticoagulation: Subcutaneous Lovenox.     Vascular study negative 3. Pain Management: Oxycodone as needed, frequency decreased on 5/23  Robaxin when necessary started on 5/23 4. Mood: Provide emotional support 5. Neuropsych: This patient is capable of making decisions on his own behalf. 6. Skin/Wound Care: Routine skin checks 7. Fluids/Electrolytes/Nutrition: Routine in and outs    BMP within acceptable range on 5/21 8.  Acute on chronic anemia.  Continue iron supplement   Hemoglobin 9.1 on 5/21   Continue to monitor 9.  Recent fall with left humeral and radial/ulnar fractures. 10.  Type 2 diabetes mellitus.  Hemoglobin A1c 7.6.  SSI.  Check blood sugars before meals  and at bedtime (on metformin 1000 twice a day, Amaryl 4 mg daily PTA).   Metformin 500 twice a day started on 5/22, increased to 1000mg  on 5/24   Labile on 5/24 11.  Hyperlipidemia.  Pravachol 12.  History of severe aortic stenosis.  Status post bioprosthetic AVR 2012. 13.  History of GI bleed.  Continue Protonix   See #8 14. Hypoalbuminemia   Supplement initiated on 5/21 15. Constipation  Bowel regiment increased on 5/23  LOS (Days) 4 A FACE TO FACE EVALUATION WAS PERFORMED  Lucerito Rosinski [name] 04/28/2018 9:11 AM

## 2018-04-29 ENCOUNTER — Inpatient Hospital Stay (HOSPITAL_COMMUNITY): Payer: Medicare Other | Admitting: Occupational Therapy

## 2018-04-29 DIAGNOSIS — S7222XS Displaced subtrochanteric fracture of left femur, sequela: Secondary | ICD-10-CM

## 2018-04-29 LAB — GLUCOSE, CAPILLARY
GLUCOSE-CAPILLARY: 175 mg/dL — AB (ref 65–99)
GLUCOSE-CAPILLARY: 156 mg/dL — AB (ref 65–99)
Glucose-Capillary: 180 mg/dL — ABNORMAL HIGH (ref 65–99)
Glucose-Capillary: 187 mg/dL — ABNORMAL HIGH (ref 65–99)

## 2018-04-29 IMAGING — RF DG WRIST 2V*L*
1 series · 3 of 3 positions shown · non-contrast
Comparison: 01/15/2018

CLINICAL DATA: ORIF of left wrist fracture

EXAM:
DG C-ARM 61-120 MIN; LEFT WRIST - 2 VIEW

[Series 1: run · 3 of 3 slices shown]
[im 1/3]
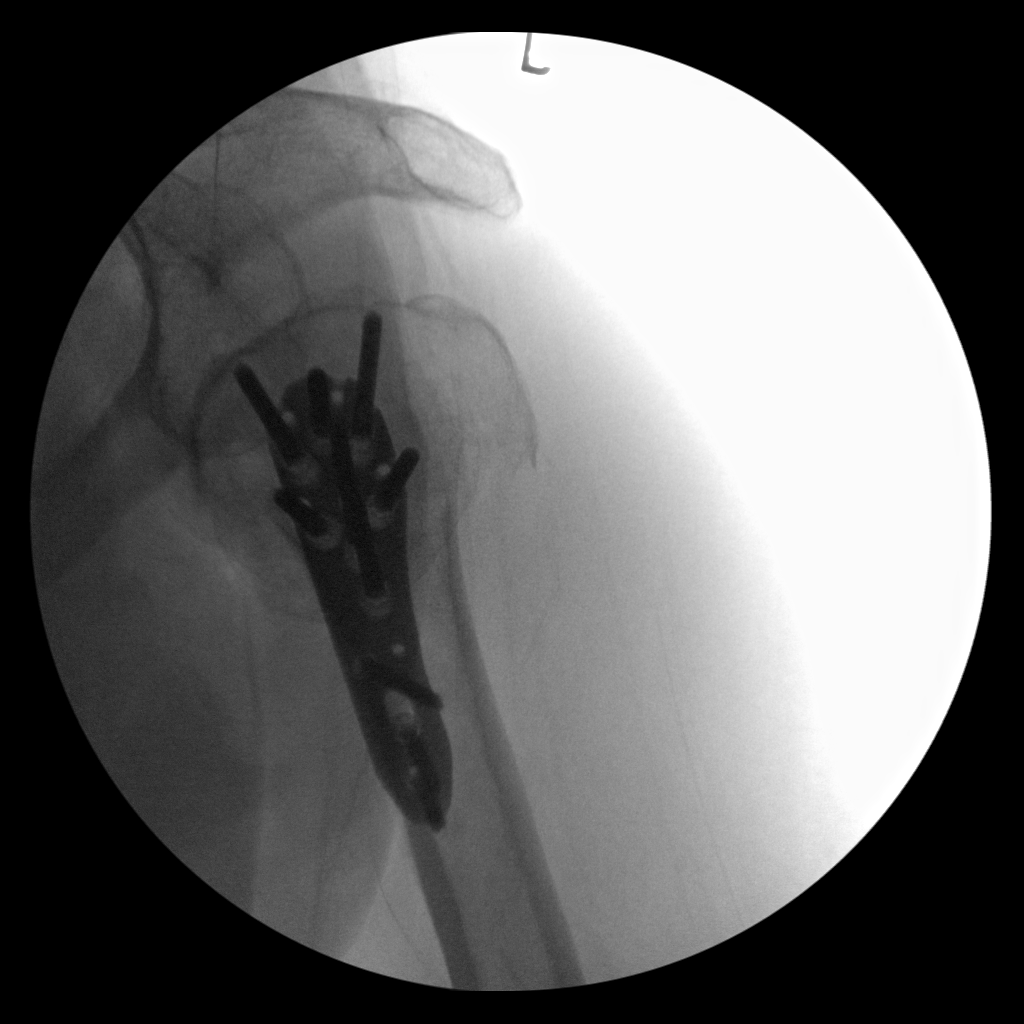
[im 2/3]
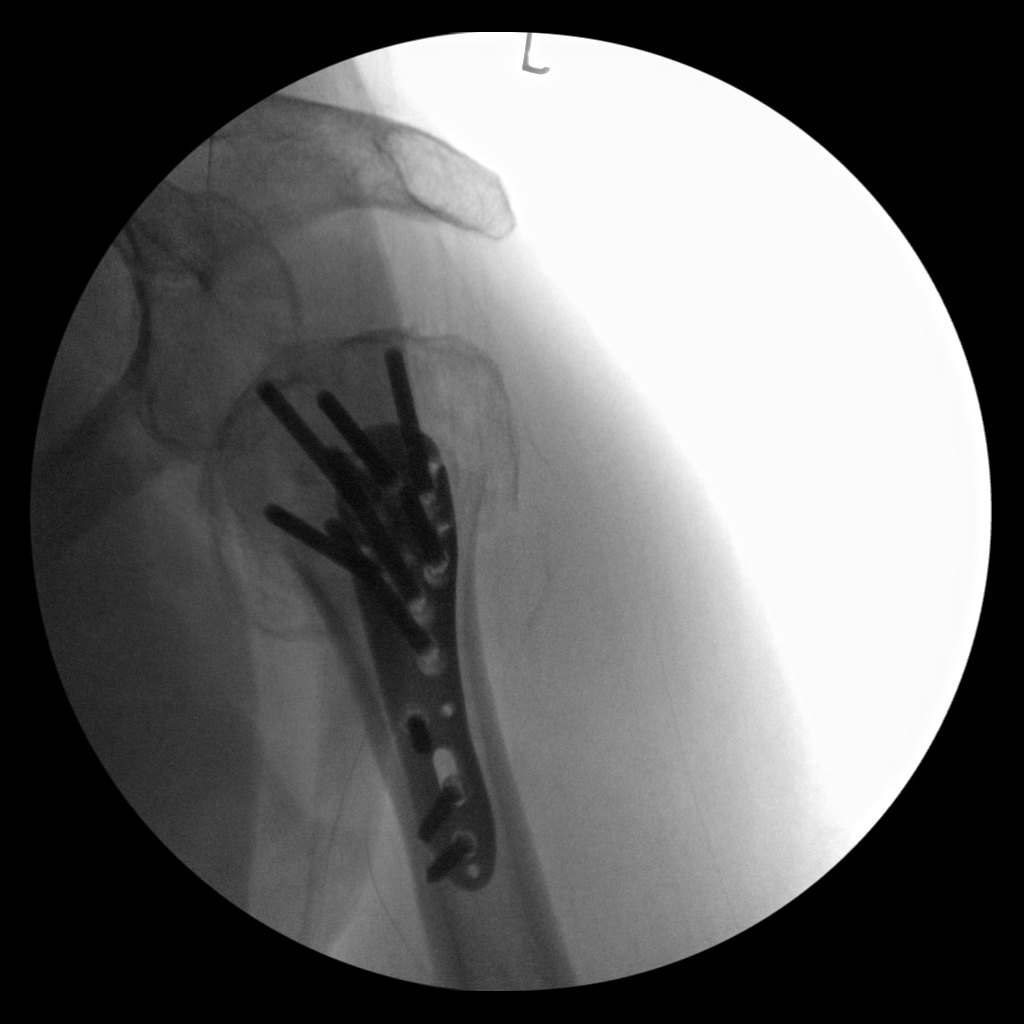
[im 3/3]
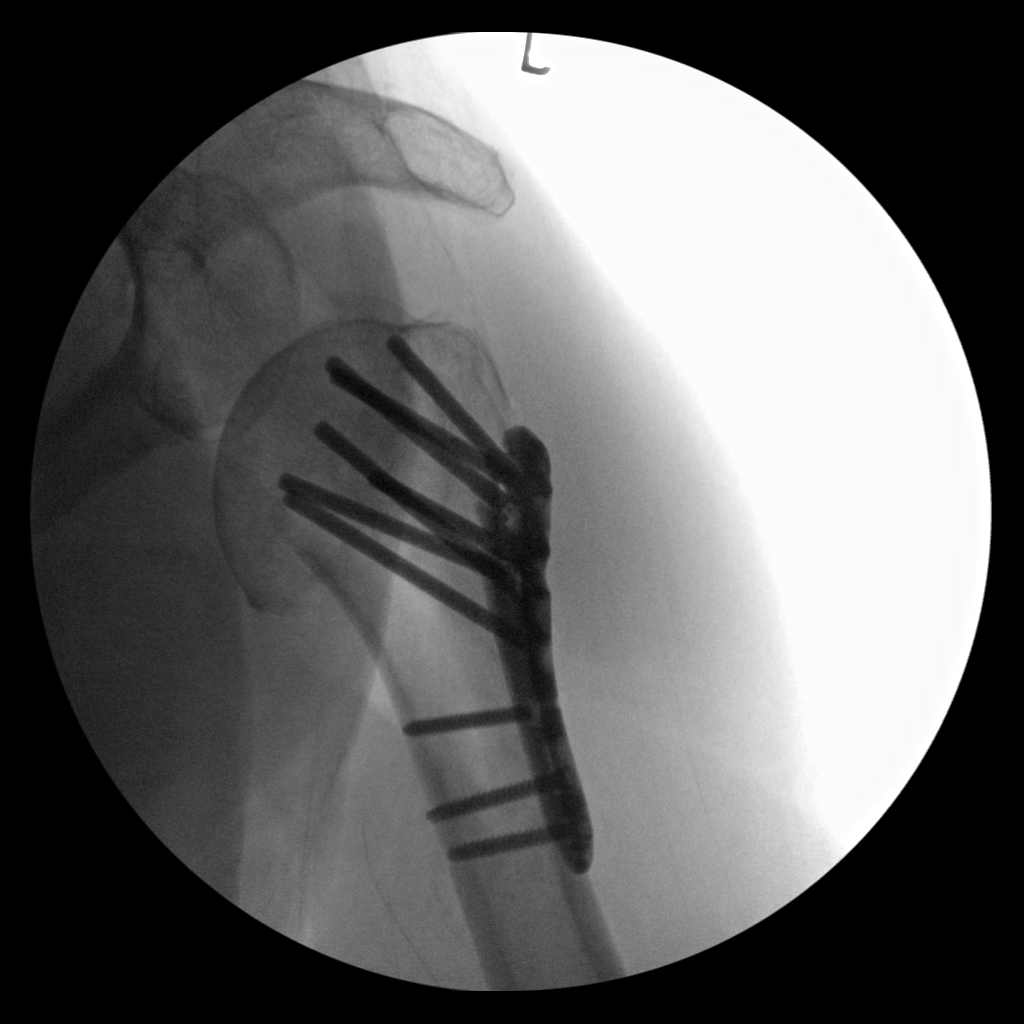

[3 of 3 positions shown; findings below may reference images not displayed]

FLUOROSCOPY TIME:  Fluoroscopy Time:  56 seconds

Radiation Exposure Index (if provided by the fluoroscopic device):
Not available

Number of Acquired Spot Images: 4
FINDINGS: Fixation sideplate is noted along the distal left radius. Multiple
fixation screws are noted. The fracture fragments are in near
anatomic alignment.
IMPRESSION: ORIF of distal left radial fracture.

## 2018-04-29 NOTE — Progress Notes (Signed)
[place] PHYSICAL MEDICINE & REHABILITATION     PROGRESS NOTE  Subjective/Complaints:   Good BM yesterday   ROS: Denies CP, SOB, nausea, vomiting, diarrhea.  Objective: Vital Signs: Blood pressure (!) 126/57, pulse 67, temperature 98.3 F (36.8 C), temperature source Oral, resp. rate 18, weight 78.7 kg (173 lb 8 oz), SpO2 96 %. No results found. No results for input(s): WBC, HGB, HCT, PLT in the last 72 hours. No results for input(s): NA, K, CL, GLUCOSE, BUN, CREATININE, CALCIUM in the last 72 hours.  Invalid input(s): CO CBG (last 3)  Recent Labs    04/28/18 1643 04/28/18 2103 04/29/18 0627  GLUCAP 192* 194* 175*    Wt Readings from Last 3 Encounters:  04/24/18 78.7 kg (173 lb 8 oz)  04/22/18 81.3 kg (179 lb 3.7 oz)  01/19/18 88.5 kg (195 lb)    Physical Exam:  BP (!) 126/57 (BP Location: Left Arm)   Pulse 67   Temp 98.3 F (36.8 C) (Oral)   Resp 18   Wt 78.7 kg (173 lb 8 oz)   SpO2 96%   BMI 23.53 kg/m  Constitutional: He appears well-developed and well-nourished. NAD. HENT: Normocephalic and atraumatic.  Eyes: EOM are normal. No discharge.  Cardiovascular: RRR. No JVD.  Respiratory: Effort normal and breath sounds normal.  GI: Bowel sounds are normal. He exhibits no distension.  Musculoskeletal: Left hip edema and tenderness  Neurological: He is alert and oriented.  Motor: Bilateral upper extremities: 4+/5 proximal to distal Right lower extremity: Hip flexion, knee extension 4/5, ankle dorsiflexion 4+/5 Left lower extremity: Hip flexion, knee extension 1+/5, ankle dorsiflexion 4+/5 (stable) Skin: Surgical site with C/D/I  Psychiatric: He has a normal mood and affect. His behavior is normal.   Assessment/Plan: 1. Functional deficits secondary to left hip fracture which require 3+ hours per day of interdisciplinary therapy in a comprehensive inpatient rehab setting. Physiatrist is providing close team supervision and 24 hour management of active medical  problems listed below. Physiatrist and rehab team continue to assess barriers to discharge/monitor patient progress toward functional and medical goals.  Function:  Bathing Bathing position   Position: Bed(sitting EOB for UB)  Bathing parts Body parts bathed by patient: Right arm, Left arm, Chest, Abdomen, Front perineal area Body parts bathed by helper: Buttocks, Right upper leg, Left upper leg, Right lower leg, Left lower leg, Back  Bathing assist Assist Level: (Mod assist)      Upper Body Dressing/Undressing Upper body dressing   What is the patient wearing?: Button up shirt         Button up shirt - Perfomed by patient: Thread/unthread right sleeve Button up shirt - Perfomed by helper: Thread/unthread left sleeve, Pull shirt around back, Button/unbutton shirt    Upper body assist Assist Level: (Max assist)      Lower Body Dressing/Undressing Lower body dressing   What is the patient wearing?: Pants, Non-skid slipper socks   Underwear - Performed by helper: Thread/unthread right underwear leg, Thread/unthread left underwear leg, Pull underwear up/down   Pants- Performed by helper: Thread/unthread right pants leg, Thread/unthread left pants leg, Pull pants up/down   Non-skid slipper socks- Performed by helper: Don/doff right sock, Don/doff left sock                  Lower body assist Assist for lower body dressing: (Total assist)      Toileting Toileting   Toileting steps completed by patient: Adjust clothing prior to toileting, Performs perineal hygiene  Toileting steps completed by helper: Adjust clothing after toileting(per Jobelle Mesias, NT)    Toileting assist Assist level: Touching or steadying assistance (Pt.75%)(per Leann Nickles-Wright, NT)   Transfers Chair/bed transfer   Chair/bed transfer method: Lateral scoot Chair/bed transfer assist level: 2 helpers Chair/bed transfer assistive device: Sliding board     Locomotion Ambulation Ambulation  activity did not occur: Safety/medical Editor, commissioning activity did not occur: Safety/medical concerns Type: Manual Max wheelchair distance: 75 Assist Level: Touching or steadying assistance (Pt > 75%)  Cognition Comprehension Comprehension assist level: Follows basic conversation/direction with no assist  Expression Expression assist level: Expresses complex ideas: With no assist  Social Interaction Social Interaction assist level: Interacts appropriately 75 - 89% of the time - Needs redirection for appropriate language or to initiate interaction.  Problem Solving Problem solving assist level: Solves basic 90% of the time/requires cueing < 10% of the time  Memory Memory assist level: Recognizes or recalls 75 - 89% of the time/requires cueing 10 - 24% of the time    Medical Problem List and Plan: 1.  Decreased functional mobility secondary to left subtrochanteric femur fracture, retained hip fusion hardware placed in the 1950s and broken hardware status post ORIF 04/18/2018.  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  DVT Prophylaxis/Anticoagulation: Subcutaneous Lovenox.     Vascular study negative 3. Pain Management: Oxycodone as needed, frequency decreased on 5/23  Robaxin when necessary started on 5/23 4. Mood: Provide emotional support 5. Neuropsych: This patient is capable of making decisions on his own behalf. 6. Skin/Wound Care: Routine skin checks 7. Fluids/Electrolytes/Nutrition: Routine in and outs    BMP within acceptable range on 5/21 8.  Acute on chronic anemia.  Continue iron supplement   Hemoglobin 9.1 on 5/21   Continue to monitor 9.  Recent fall with left humeral and radial/ulnar fractures. 10.  Type 2 diabetes mellitus.  Hemoglobin A1c 7.6.  SSI.  Check blood sugars before meals and at bedtime (on metformin 1000 twice  a day, Amaryl 4 mg daily PTA).   Metformin 500 twice a day started on 5/22, increased to 1000mg  on 5/24   CBG (last 3)  Recent Labs    04/28/18 1643 04/28/18 2103 04/29/18 0627  GLUCAP 192* 194* 175*  Slightly over desired range will monitor on increased metformin 11.  Hyperlipidemia.  Pravachol 12.  History of severe aortic stenosis.  Status post bioprosthetic AVR 2012. 13.  History of GI bleed.  Continue Protonix   See #8 14. Hypoalbuminemia   Supplement initiated on 5/21 15. Constipation  Bowel regiment increased on 5/23- pt reports good BM yesterday LOS (Days) 5 A FACE TO FACE EVALUATION WAS PERFORMED  Charlett Blake 04/29/2018 7:24 AM

## 2018-04-29 NOTE — Progress Notes (Signed)
Occupational Therapy Session Note  Patient Details  Name: Casey Gilmore MRN: [id number] Date of Birth: [birth year]  Today's Date: 04/29/2018 OT Individual Time: 3094-0768 OT Individual Time Calculation (min): 45 min    Short Term Goals: Week 1:  OT Short Term Goal 1 (Week 1): Pt will complete sit > stand with mod assist of 1 caregiver in preparation to complete LB dressing at sit > stand level OT Short Term Goal 2 (Week 1): Pt will complete bathing with mod assist OT Short Term Goal 3 (Week 1): Pt will complete toilet transfer with mod assist of 1 caregiver OT Short Term Goal 4 (Week 1): Pt will complete LB dressing with max assist and use of AE as needed  Skilled Therapeutic Interventions/Progress Updates: patient stated he was too fatigued to get OOB but concurred to participated in skilled OT to work on lower body dressing and bed mobility to work on overall increase in self care and mobility.   Unfortunately, he was not able to tolerate any movement whatsoever in his left leg (Passive or Active assistive) without screaming out in pain.      He concurred to continue working on abdomenal, right leg and upper body strengthening endurance  He stated, "There is nothing I can do with that leg,and I will get around walking the best I can.   I cannot even sit right in a chair."  He was left at the end of the session with call bell and bed alarm engaged.     Therapy Documentation Precautions:  Precautions Precautions: Fall Restrictions Weight Bearing Restrictions: Yes LLE Weight Bearing: Touchdown weight bearing Other Position/Activity Restrictions: No ROM restrictions noted in note w/ hip, however pt reports pt has been limited to ~45 deg of L hip flexion since a hip surgery he had in the 1950s    Pain:8/10 constant aching.  RN gave meds   See Function Navigator for Current Functional Status.   Therapy/Group: Individual Therapy  Alfredia Ferguson [facility] 04/29/2018, 5:23 PM

## 2018-04-30 ENCOUNTER — Inpatient Hospital Stay (HOSPITAL_COMMUNITY): Payer: Medicare Other | Admitting: Occupational Therapy

## 2018-04-30 ENCOUNTER — Inpatient Hospital Stay (HOSPITAL_COMMUNITY): Payer: Medicare Other

## 2018-04-30 LAB — GLUCOSE, CAPILLARY
GLUCOSE-CAPILLARY: 158 mg/dL — AB (ref 65–99)
GLUCOSE-CAPILLARY: 142 mg/dL — AB (ref 65–99)
GLUCOSE-CAPILLARY: 149 mg/dL — AB (ref 65–99)
GLUCOSE-CAPILLARY: 158 mg/dL — AB (ref 65–99)
Glucose-Capillary: 143 mg/dL — ABNORMAL HIGH (ref 65–99)

## 2018-04-30 NOTE — Progress Notes (Signed)
Physical Therapy Session Note  Patient Details  Name: Casey Gilmore MRN: [id number] Date of Birth: 01-04-1941  Today's Date: 04/30/2018 PT Individual Time: 1031-5945 PT Individual Time Calculation (min): 45 min   Short Term Goals: Week 1:  PT Short Term Goal 1 (Week 1): Pt will initiate gait training w/ LRAD PT Short Term Goal 2 (Week 1): Pt will transfer bed<>chair w/ mod assist PT Short Term Goal 3 (Week 1): Pt will initiate w/c self-propulsion training PT Short Term Goal 4 (Week 1): Pt will perform bed mobility w/ mod assist  Skilled Therapeutic Interventions/Progress Updates:    Pt supine in bed upon PT arrival, agreeable to therapy tx and reports pain 8/10 in L hip. Pt performed rolling in each direction with mod assist and use of bedrails in order to don pants, total assist. Pt transferred from supine>sitting EOB with max assist, verbal cues for techniques. Pt performed slideboard transfer lateral scoot with max assist +2, manual facilitation for weightshifting. Pt propelled w/c with min assist x 20 ft, increased time. Pt requiring max assist to scoot hips back in w/c. Pt performed x 10 marches and x 10 LAQ with R LE for strengthening. Pt transported back to room and left seated in w/c with needs in reach and chair alarm set.   Therapy Documentation Precautions:  Precautions Precautions: Fall Restrictions Weight Bearing Restrictions: Yes LLE Weight Bearing: Touchdown weight bearing Other Position/Activity Restrictions: No ROM restrictions noted in note w/ hip, however pt reports pt has been limited to ~45 deg of L hip flexion since a hip surgery he had in the 1950s   See Function Navigator for Current Functional Status.   Therapy/Group: Individual Therapy  Netta Corrigan, PT, DPT 04/30/2018, 7:50 AM

## 2018-04-30 NOTE — Progress Notes (Signed)
[place] PHYSICAL MEDICINE & REHABILITATION     PROGRESS NOTE  Subjective/Complaints:   No issues overnite except back pain, has a leg length discrepency since the hip fracture   ROS: Denies CP, SOB, nausea, vomiting, diarrhea.  Objective: Vital Signs: Blood pressure 121/72, pulse 71, temperature 98.6 F (37 C), temperature source Oral, resp. rate 18, weight 78.7 kg (173 lb 8 oz), SpO2 92 %. No results found. No results for input(s): WBC, HGB, HCT, PLT in the last 72 hours. No results for input(s): NA, K, CL, GLUCOSE, BUN, CREATININE, CALCIUM in the last 72 hours.  Invalid input(s): CO CBG (last 3)  Recent Labs    04/29/18 1631 04/29/18 2106 04/30/18 0633  GLUCAP 187* 156* 158*    Wt Readings from Last 3 Encounters:  04/24/18 78.7 kg (173 lb 8 oz)  04/22/18 81.3 kg (179 lb 3.7 oz)  01/19/18 88.5 kg (195 lb)    Physical Exam:  BP 121/72 (BP Location: Left Arm)   Pulse 71   Temp 98.6 F (37 C) (Oral)   Resp 18   Wt 78.7 kg (173 lb 8 oz)   SpO2 92%   BMI 23.53 kg/m  Constitutional: He appears well-developed and well-nourished. NAD. HENT: Normocephalic and atraumatic.  Eyes: EOM are normal. No discharge.  Cardiovascular: RRR. No JVD.  Respiratory: Effort normal and breath sounds normal.  GI: Bowel sounds are normal. He exhibits no distension.  Musculoskeletal: Left hip edema and tenderness  Neurological: He is alert and oriented.  Motor: Bilateral upper extremities: 4+/5 proximal to distal Right lower extremity: Hip flexion, knee extension 4/5, ankle dorsiflexion 4+/5 Left lower extremity: Hip flexion, knee extension 1+/5, ankle dorsiflexion 4+/5 (stable) Skin: Surgical site with C/D/I  Psychiatric: He has a normal mood and affect. His behavior is normal.   Assessment/Plan: 1. Functional deficits secondary to left hip fracture which require 3+ hours per day of interdisciplinary therapy in a comprehensive inpatient rehab setting. Physiatrist is providing close  team supervision and 24 hour management of active medical problems listed below. Physiatrist and rehab team continue to assess barriers to discharge/monitor patient progress toward functional and medical goals.  Function:  Bathing Bathing position   Position: Bed(sitting EOB for UB)  Bathing parts Body parts bathed by patient: Right arm, Left arm, Chest, Abdomen, Front perineal area Body parts bathed by helper: Buttocks, Right upper leg, Left upper leg, Right lower leg, Left lower leg, Back  Bathing assist Assist Level: (Mod assist)      Upper Body Dressing/Undressing Upper body dressing   What is the patient wearing?: Button up shirt         Button up shirt - Perfomed by patient: Thread/unthread right sleeve Button up shirt - Perfomed by helper: Thread/unthread left sleeve, Pull shirt around back, Button/unbutton shirt    Upper body assist Assist Level: (Max assist)      Lower Body Dressing/Undressing Lower body dressing   What is the patient wearing?: Pants, Non-skid slipper socks   Underwear - Performed by helper: Thread/unthread right underwear leg, Thread/unthread left underwear leg, Pull underwear up/down   Pants- Performed by helper: Thread/unthread right pants leg, Thread/unthread left pants leg, Pull pants up/down   Non-skid slipper socks- Performed by helper: Don/doff right sock, Don/doff left sock                  Lower body assist Assist for lower body dressing: (Total assist)      Toileting Toileting   Toileting steps completed  by patient: Adjust clothing prior to toileting, Performs perineal hygiene Toileting steps completed by helper: Adjust clothing after toileting(per Jobelle Mesias, NT)    Toileting assist Assist level: Touching or steadying assistance (Pt.75%)(per Leann Nickles-Wright, NT)   Transfers Chair/bed transfer   Chair/bed transfer method: Lateral scoot Chair/bed transfer assist level: 2 helpers Chair/bed transfer assistive device:  Sliding board     Locomotion Ambulation Ambulation activity did not occur: Safety/medical Editor, commissioning activity did not occur: Safety/medical concerns Type: Manual Max wheelchair distance: 75 Assist Level: Touching or steadying assistance (Pt > 75%)  Cognition Comprehension Comprehension assist level: Follows basic conversation/direction with no assist  Expression Expression assist level: Expresses complex ideas: With no assist  Social Interaction Social Interaction assist level: Interacts appropriately 75 - 89% of the time - Needs redirection for appropriate language or to initiate interaction.  Problem Solving Problem solving assist level: Solves basic 90% of the time/requires cueing < 10% of the time  Memory Memory assist level: Recognizes or recalls 75 - 89% of the time/requires cueing 10 - 24% of the time    Medical Problem List and Plan: 1.  Decreased functional mobility secondary to left subtrochanteric femur fracture, retained hip fusion hardware placed in the 1950s and broken hardware status post ORIF 04/18/2018.  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  DVT Prophylaxis/Anticoagulation: Subcutaneous Lovenox.     Vascular study negative 3. Pain Management: Oxycodone as needed, frequency decreased on 5/23  Robaxin when necessary started on 5/23 4. Mood: Provide emotional support 5. Neuropsych: This patient is capable of making decisions on his own behalf. 6. Skin/Wound Care: Routine skin checks 7. Fluids/Electrolytes/Nutrition: Routine in and outs    BMP within acceptable range on 5/21 8.  Acute on chronic anemia.  Continue iron supplement   Hemoglobin 9.1 on 5/21   Continue to monitor 9.  Recent fall with left humeral and radial/ulnar fractures. 10.  Type 2 diabetes mellitus.  Hemoglobin  A1c 7.6.  SSI.  Check blood sugars before meals and at bedtime (on metformin 1000 twice a day, Amaryl 4 mg daily PTA).   Metformin 500 twice a day started on 5/22, increased to 1000mg  on 5/24   CBG (last 3)  Recent Labs    04/29/18 1631 04/29/18 2106 04/30/18 0633  GLUCAP 187* 156* 158*  Controlled 5/26 11.  Hyperlipidemia.  Pravachol 12.  History of severe aortic stenosis.  Status post bioprosthetic AVR 2012. 13.  History of GI bleed.  Continue Protonix   See #8 14. Hypoalbuminemia   Supplement initiated on 5/21 15. Constipation  Bowel regiment increased on 5/23-  LOS (Days) 6 A FACE TO FACE EVALUATION WAS PERFORMED  Charlett Blake 04/30/2018 7:17 AM

## 2018-04-30 NOTE — Progress Notes (Signed)
Occupational Therapy Session Note  Patient Details  Name: TRAVAUGHN VUE MRN: [id number] Date of Birth: 1940/12/07  Today's Date: 04/30/2018 OT Individual Time:  -     10 25-11:40  (75 min)  (1st  Session)                                               1300-1430  90  min) (2nd session)     Short Term Goals: Week 1:  OT Short Term Goal 1 (Week 1): Pt will complete sit > stand with mod assist of 1 caregiver in preparation to complete LB dressing at sit > stand level OT Short Term Goal 2 (Week 1): Pt will complete bathing with mod assist OT Short Term Goal 3 (Week 1): Pt will complete toilet transfer with mod assist of 1 caregiver OT Short Term Goal 4 (Week 1): Pt will complete LB dressing with max assist and use of AE as needed Week 2:     Skilled Therapeutic Interventions/Progress Updates:    1st session:  Performed Bathing and dressing at supine.  Ppt lifted UB about 20% away from pillow with max assist to don shirt with total assist.  LB= Total assist.  Pain= 7/10 and increaseing as movements performed.  Pt did total assist transfer with son lifting and pivoting pt to wc.  PPt stood with total assist for 30 sec during the transfer.  Pt positioned in wc and left with all needs in reach   2nd  Session:  Pt sitting in wc. He reported he felt fuzzy.  Had blood sugar taken and it was 149.  Provided treatment selections and pt verbalized transferring with sliding board back in bed.   Transferred from wc to bed with sliding board plus 2 nursing staff.  PPt was max assist.  Positioned in bed.  Appled SCD around Lower legs.  PPt had pain exerts of 10/10 during transitional movements, but pain resolved to 5/10 after 15 minutes.    Therapy Documentation Precautions:  Precautions Precautions: Fall Restrictions Weight Bearing Restrictions: Yes LLE Weight Bearing: Touchdown weight bearing Other Position/Activity Restrictions: No ROM restrictions noted in note w/ hip, however pt reports pt has been  limited to ~45 deg of L hip flexion since a hip surgery he had in the 1950s      Pain: Pain Assessment Pain Scale: 0-10 Pain Score:5- 10 depending on sitting versus stationary.   Pain Type: Chronic pain Pain Location: Back Pain Orientation: Lower Pain Radiating Towards: leg Pain Descriptors / Indicators: Sharp Pain Frequency: Constant Pain Onset: On-going Pain Intervention(s): Medication (See eMAR) See Function Navigator for Current Functional Status.   Therapy/Group: Individual Therapy  Lisa Roca 04/30/2018, 11:34 AM

## 2018-05-01 ENCOUNTER — Inpatient Hospital Stay (HOSPITAL_COMMUNITY): Payer: Medicare Other

## 2018-05-01 ENCOUNTER — Inpatient Hospital Stay (HOSPITAL_COMMUNITY): Payer: Medicare Other | Admitting: Physical Therapy

## 2018-05-01 ENCOUNTER — Inpatient Hospital Stay (HOSPITAL_COMMUNITY): Payer: Medicare Other | Admitting: Occupational Therapy

## 2018-05-01 DIAGNOSIS — G894 Chronic pain syndrome: Secondary | ICD-10-CM

## 2018-05-01 LAB — CREATININE, SERUM
CREATININE: 1.01 mg/dL (ref 0.61–1.24)
GFR calc Af Amer: >60 mL/min (ref >60)
GFR calc non Af Amer: >60 mL/min (ref >60)

## 2018-05-01 LAB — GLUCOSE, CAPILLARY
GLUCOSE-CAPILLARY: 123 mg/dL — AB (ref 65–99)
Glucose-Capillary: 103 mg/dL — ABNORMAL HIGH (ref 65–99)

## 2018-05-01 MED ORDER — GLIMEPIRIDE 1 MG PO TABS
1.0000 mg | ORAL_TABLET | Freq: Every day | ORAL | Status: DC
  Administered 2018-05-01 – 2018-05-12 (×12): 1 mg via ORAL
  Filled 2018-05-01 (×12): qty 1

## 2018-05-01 NOTE — Progress Notes (Signed)
Physical Therapy Session Note  Patient Details  Name: Casey Gilmore MRN: [id number] Date of Birth: 1941-05-09  Today's Date: 05/01/2018 PT Individual Time: 1030-1100 PT Individual Time Calculation (min): 30 min   Short Term Goals: Week 1:  PT Short Term Goal 1 (Week 1): Pt will initiate gait training w/ LRAD PT Short Term Goal 2 (Week 1): Pt will transfer bed<>chair w/ mod assist PT Short Term Goal 3 (Week 1): Pt will initiate w/c self-propulsion training PT Short Term Goal 4 (Week 1): Pt will perform bed mobility w/ mod assist  Skilled Therapeutic Interventions/Progress Updates:    Session focused on education about new transfer technique (posterior for OOB) and demonstration with max encouragement for patient to trial this for OOB this session. Pt self limiting due to pain. +2 assist needed to complete transfer as 1 person assisted with trunk and second person assisted with placement of legs during transitional movements. Very slow process due to pain and pt increased anxiety. Pt maintained posterior lean throughout due to L hip limitations and transitioned to reclining w/c with pt utilizing RUE to hook. Handoff to OT. RN working on getting pain medication and ice pack for L hip pain.   Therapy Documentation Precautions:  Precautions Precautions: Fall Restrictions Weight Bearing Restrictions: Yes LLE Weight Bearing: Touchdown weight bearing Other Position/Activity Restrictions: No ROM restrictions noted in note w/ hip, however pt reports pt has been limited to ~45 deg of L hip flexion since a hip surgery he had in the 1950s   Pain: Pain Assessment Pain Scale: 0-10 Pain Score: 6  Pain Type: Chronic pain Pain Location: Hip Pain Orientation: Left Pain Descriptors / Indicators: Sharp;Aching Pain Frequency: Constant Pain Onset: On-going Pain Intervention(s): Repositioned;Cold applied   See Function Navigator for Current Functional Status.   Therapy/Group: Individual  Therapy  Canary Brim Ivory Broad, PT, DPT  05/01/2018, 11:53 AM

## 2018-05-01 NOTE — Progress Notes (Signed)
Nurse removed 11 sutures from L hip area. No redness or bleeding noted. No drainage present. Site clean and dry. OTA. No c/o pain to suture removal. Will cont to monitor.  K Valta Dillon LPN

## 2018-05-01 NOTE — Plan of Care (Signed)
  Problem: Consults Goal: RH GENERAL PATIENT EDUCATION Description See Patient Education module for education specifics. Outcome: Progressing   Problem: RH BOWEL ELIMINATION Goal: RH STG MANAGE BOWEL WITH ASSISTANCE Description STG Manage Bowel with Min.Assistance.  Outcome: Progressing Goal: RH STG MANAGE BOWEL W/MEDICATION W/ASSISTANCE Description STG Manage Bowel with Medication with Min.Assistance.  Outcome: Progressing   Problem: RH BLADDER ELIMINATION Goal: RH STG MANAGE BLADDER WITH ASSISTANCE Description STG Manage Bladder With Min. Assistance  Outcome: Progressing   Problem: RH SKIN INTEGRITY Goal: RH STG SKIN FREE OF INFECTION/BREAKDOWN Description With min. Assist.  Outcome: Progressing Goal: RH STG MAINTAIN SKIN INTEGRITY WITH ASSISTANCE Description STG Maintain Skin Integrity With Min. Assistance.  Outcome: Progressing Goal: RH STG ABLE TO PERFORM INCISION/WOUND CARE W/ASSISTANCE Description STG Able To Perform Incision/Wound Care With Min.Assistance.  Outcome: Progressing   Problem: RH SAFETY Goal: RH STG ADHERE TO SAFETY PRECAUTIONS W/ASSISTANCE/DEVICE Description STG Adhere to Safety Precautions With Mod.Assistance/Device.  Outcome: Progressing Goal: RH STG DECREASED RISK OF FALL WITH ASSISTANCE Description STG Decreased Risk of Fall With Mod. Assistance.  Outcome: Progressing   Problem: RH PAIN MANAGEMENT Goal: RH STG PAIN MANAGED AT OR BELOW PT'S PAIN GOAL Description Less than 3,on 1 to 10 scale  Outcome: Progressing

## 2018-05-01 NOTE — Plan of Care (Signed)
  Problem: Consults Goal: [facility] GENERAL PATIENT EDUCATION Description: See Patient Education module for education specifics. Outcome: Progressing   

## 2018-05-01 NOTE — Progress Notes (Signed)
Occupational Therapy Session Note  Patient Details  Name: Casey Gilmore MRN: [id number] Date of Birth: 01-09-1941  Today's Date: 05/01/2018 OT Individual Time: 1100-1200 OT Individual Time Calculation (min): 60 min    Short Term Goals: Week 1:  OT Short Term Goal 1 (Week 1): Pt will complete sit > stand with mod assist of 1 caregiver in preparation to complete LB dressing at sit > stand level OT Short Term Goal 2 (Week 1): Pt will complete bathing with mod assist OT Short Term Goal 3 (Week 1): Pt will complete toilet transfer with mod assist of 1 caregiver OT Short Term Goal 4 (Week 1): Pt will complete LB dressing with max assist and use of AE as needed  Skilled Therapeutic Interventions/Progress Updates:    Treatment session with focus on BUE strengthening and problem solving transfers.  Pt just having completed posterior transfer with PT into w/c.  Pt reports increased pain and requesting to wait until RN provided pt with pain meds, pain meds administered quickly.  Pt hesitant to engage in any further transfers due to pain.  Engaged in [place] strengthening in upright sitting with use of theraband with chest presses and PNF diagonals.   Pt with decreased ROM in Lt shoulder, therapist providing cues to increase range as tolerated and promote good technique as range allows.  Engaged in pivoting motion through RLE in sitting as pt will require use of RLE to complete functional transfers, pt's fear of falling impede progress with transfers and standing tolerance.  Pt left upright in w/c set up for lunch.  Therapy Documentation Precautions:  Precautions Precautions: Fall Restrictions Weight Bearing Restrictions: Yes LLE Weight Bearing: Touchdown weight bearing Other Position/Activity Restrictions: No ROM restrictions noted in note w/ hip, however pt reports pt has been limited to ~45 deg of L hip flexion since a hip surgery he had in the 1950s Pain: Pain Assessment Pain Scale: 0-10 Pain  Score: 4  Pain Type: Chronic pain Pain Location: Hip Pain Orientation: Left Pain Descriptors / Indicators: Sharp;Aching Pain Frequency: Constant Pain Onset: On-going Pain Intervention(s): Repositioned;Cold applied  See Function Navigator for Current Functional Status.   Therapy/Group: Individual Therapy  [name] 05/01/2018, 2:21 PM

## 2018-05-01 NOTE — Progress Notes (Signed)
Physical Therapy Session Note  Patient Details  Name: Casey Gilmore MRN: [id number] Date of Birth: 01-26-1941  Today's Date: 05/01/2018 PT Individual Time: 1305-1420 PT Individual Time Calculation (min): 75 min   Short Term Goals: Week 1:  PT Short Term Goal 1 (Week 1): Pt will initiate gait training w/ LRAD PT Short Term Goal 2 (Week 1): Pt will transfer bed<>chair w/ mod assist PT Short Term Goal 3 (Week 1): Pt will initiate w/c self-propulsion training PT Short Term Goal 4 (Week 1): Pt will perform bed mobility w/ mod assist  Skilled Therapeutic Interventions/Progress Updates: Pt presented in w/c agreeable to therapy. Pt indicating currently decreased pain 6/10 and pt noted is ale to tolerate leg slightly lowered  (LLE tethered to ELR via resistance band). Discussed with pt possible options in attempting alternative transfer to bed. Pt requesting performing lateral scoot to bed with PTA using draw sheet to move pt. Discussed with pt that method did not provide opportunity for pt to assist  to facilitate transfer. Pt refusing to use bed rail to pull and perform assisted pivot to bed. Pt agreeable to attempt stand pivot to bed. Pt required mod/maxA x 2 from w/c and was able to maintain standing with RW with modA. Initiated stand pivot transfer to bed with mod/maxA x 3 (3rd person for safety). Pt able to initiate turn with foot then pt noted to have increased anxiety and required assist for turning foot. As pt became fatigued required increased assist to maintain standing and required maxA from PTA to descend to bed. Pt required maxA x 2 sit to supine and total assist for boosting to [facility]. Pt repositioned to comfort and left with bed with bed alarm on and LPN present to remove sutures.       Therapy Documentation Precautions:  Precautions Precautions: Fall Restrictions Weight Bearing Restrictions: Yes LLE Weight Bearing: Touchdown weight bearing Other Position/Activity Restrictions: No ROM  restrictions noted in note w/ hip, however pt reports pt has been limited to ~45 deg of L hip flexion since a hip surgery he had in the 1950s General:   Vital Signs: Therapy Vitals Temp: 97.6 F (36.4 C) Temp Source: Oral Pulse Rate: 89 Resp: 20 BP: 115/70 Patient Position (if appropriate): Lying Oxygen Therapy SpO2: 96 % O2 Device: Room Air  See Function Navigator for Current Functional Status.   Therapy/Group: Individual Therapy  Macon Sandiford 05/01/2018, 3:43 PM

## 2018-05-01 NOTE — Progress Notes (Signed)
[facility] PHYSICAL MEDICINE & REHABILITATION     PROGRESS NOTE  Subjective/Complaints:  Pt seen lying in bed this AM.  He slept well overnight.  He is upset about not receiving pain meds on time and states he was receiving 15mg  q3 hours at home.    ROS: Denies CP, SOB, nausea, vomiting, diarrhea.  Objective: Vital Signs: Blood pressure 136/65, pulse 75, temperature 100.3 F (37.9 C), temperature source Oral, resp. rate 16, weight 78.7 kg (173 lb 8 oz), SpO2 94 %. No results found. No results for input(s): WBC, HGB, HCT, PLT in the last 72 hours. Recent Labs    05/01/18 0457  CREATININE 1.01   CBG (last 3)  Recent Labs    04/30/18 1322 04/30/18 1624 04/30/18 2104  GLUCAP 149* 143* 158*    Wt Readings from Last 3 Encounters:  04/24/18 78.7 kg (173 lb 8 oz)  04/22/18 81.3 kg (179 lb 3.7 oz)  01/19/18 88.5 kg (195 lb)    Physical Exam:  BP 136/65   Pulse 75   Temp 100.3 F (37.9 C) (Oral)   Resp 16   Wt 78.7 kg (173 lb 8 oz)   SpO2 94%   BMI 23.53 kg/m  Constitutional: He appears well-developed and well-nourished. NAD. HENT: Normocephalic and atraumatic.  Eyes: EOM are normal. No discharge.  Cardiovascular: RRR. No JVD.  Respiratory: Effort normal and breath sounds normal.  GI: Bowel sounds are normal. He exhibits no distension.  Musculoskeletal: Left hip edema and tenderness  Neurological: He is alert and oriented.  Motor: Bilateral upper extremities: 4+/5 proximal to distal Right lower extremity: Hip flexion, knee extension 4/5, ankle dorsiflexion 4+/5 Left lower extremity: Hip flexion, knee extension 1+/5, ankle dorsiflexion 4+/5 (unchanged) Skin: Surgical site with C/D/I  Psychiatric: He has a normal mood and affect. His behavior is normal.   Assessment/Plan: 1. Functional deficits secondary to left hip fracture which require 3+ hours per day of interdisciplinary therapy in a comprehensive inpatient rehab setting. Physiatrist is providing close team  supervision and 24 hour management of active medical problems listed below. Physiatrist and rehab team continue to assess barriers to discharge/monitor patient progress toward functional and medical goals.  Function:  Bathing Bathing position   Position: Bed(sitting EOB for UB)  Bathing parts Body parts bathed by patient: Right arm, Left arm, Chest, Abdomen, Front perineal area Body parts bathed by helper: Buttocks, Right upper leg, Left upper leg, Right lower leg, Left lower leg, Back  Bathing assist Assist Level: (Mod assist)      Upper Body Dressing/Undressing Upper body dressing   What is the patient wearing?: Button up shirt         Button up shirt - Perfomed by patient: Thread/unthread right sleeve Button up shirt - Perfomed by helper: Thread/unthread left sleeve, Pull shirt around back, Button/unbutton shirt    Upper body assist Assist Level: (Max assist)      Lower Body Dressing/Undressing Lower body dressing   What is the patient wearing?: Pants, Non-skid slipper socks   Underwear - Performed by helper: Thread/unthread right underwear leg, Thread/unthread left underwear leg, Pull underwear up/down   Pants- Performed by helper: Thread/unthread right pants leg, Thread/unthread left pants leg, Pull pants up/down   Non-skid slipper socks- Performed by helper: Don/doff right sock, Don/doff left sock                  Lower body assist Assist for lower body dressing: (Total assist)      Toileting  Toileting   Toileting steps completed by patient: Adjust clothing prior to toileting, Performs perineal hygiene Toileting steps completed by helper: Adjust clothing prior to toileting, Performs perineal hygiene, Adjust clothing after toileting    Toileting assist Assist level: Touching or steadying assistance (Pt.75%)   Transfers Chair/bed transfer   Chair/bed transfer method: Lateral scoot Chair/bed transfer assist level: 2 helpers(+3 for safety) Chair/bed transfer  assistive device: Sliding board     Locomotion Ambulation Ambulation activity did not occur: Safety/medical concerns         Wheelchair Wheelchair activity did not occur: Safety/medical concerns Type: Manual Max wheelchair distance: 75 Assist Level: Touching or steadying assistance (Pt > 75%)  Cognition Comprehension Comprehension assist level: Follows basic conversation/direction with no assist  Expression Expression assist level: Expresses complex ideas: With no assist  Social Interaction Social Interaction assist level: Interacts appropriately 75 - 89% of the time - Needs redirection for appropriate language or to initiate interaction.  Problem Solving Problem solving assist level: Solves basic 90% of the time/requires cueing < 10% of the time  Memory Memory assist level: Recognizes or recalls 75 - 89% of the time/requires cueing 10 - 24% of the time    Medical Problem List and Plan: 1.  Decreased functional mobility secondary to left subtrochanteric femur fracture, retained hip fusion hardware placed in the 1950s and broken hardware status post ORIF 04/18/2018.  Touchdown weightbearing x8 weeks   Continue CIR  2.  DVT Prophylaxis/Anticoagulation: Subcutaneous Lovenox.     Vascular study negative 3. Pain Management: Oxycodone as needed, frequency decreased on 5/23  Robaxin when necessary started on 5/23   Will follow up with [facility] regarding home narcotic regimen 4. Mood: Provide emotional support 5. Neuropsych: This patient is capable of making decisions on his own behalf. 6. Skin/Wound Care: Routine skin checks  DC staples on 5/27 7. Fluids/Electrolytes/Nutrition: Routine in and outs    BMP within acceptable range on 5/21, creatinine within normal limits on 5/27 8.  Acute on chronic anemia.  Continue iron supplement   Hemoglobin 9.1 on 5/21   Labs ordered for tomorrow   Continue to monitor 9.  Recent fall with left humeral and radial/ulnar fractures. 10.  Type 2 diabetes  mellitus.  Hemoglobin A1c 7.6.  SSI.  Check blood sugars before meals and at bedtime (on metformin 1000 twice a day, Amaryl 4 mg daily PTA).   Metformin 500 twice a day started on 5/22, increased to 1000mg  on 5/24   Amaryl 1 mg started on 5/27  CBG (last 3)  Recent Labs    04/30/18 1322 04/30/18 1624 04/30/18 2104  GLUCAP 149* 143* 158*   11.  Hyperlipidemia.  Pravachol 12.  History of severe aortic stenosis.  Status post bioprosthetic AVR 2012. 13.  History of GI bleed.  Continue Protonix   See #8 14. Hypoalbuminemia   Supplement initiated on 5/21 15. Constipation  Bowel regiment increased on 5/23   LOS (Days) 7 A FACE TO FACE EVALUATION WAS PERFORMED  Davon Folta [name] 05/01/2018 7:46 AM

## 2018-05-01 NOTE — Progress Notes (Signed)
Physical Therapy Note  Patient Details  Name: Casey Gilmore MRN: [id number] Date of Birth: 1941-10-22 Today's Date: 05/01/2018  9450-3888, 45 min individual tx Pain: 6/10 L hip , premedicated  Pt asleep, difficult to awaken fully,  and drowsy throughout session.  Bed mobility + 2 for mgt of LLE plus elevation of trunk, using bed features.  Pt sat EOB for LLE to "settle out" before attemtping to stand.  From raised bed, sit> stand +2 , pulling up on RW.  Pt stood with R shoe on, x 2 minutes, mod assist for balance and A-P midine orienation due to leaning backwards. attempted stand pivot transfer to R, at pt's request, but pt unable to pivot on R foot at all.  Eventually sat back down on bed.  Slide board transfer to R not attempted due to pt sitting hard on R hip.  In supine, pt performed 2 x 10 R unilateral bridging, modified abdominal crunches without use of UEs, R straight leg raises.  Pt may benefit from use of MaxiSlide for A-P or lateral transfer.  Pt left resting in bed with needs at hand, and alarm set.  See function navigator for current status.  Lycan Davee 05/01/2018, 8:19 AM

## 2018-05-02 ENCOUNTER — Inpatient Hospital Stay (HOSPITAL_COMMUNITY): Payer: Medicare Other | Admitting: Occupational Therapy

## 2018-05-02 ENCOUNTER — Inpatient Hospital Stay (HOSPITAL_COMMUNITY): Payer: Medicare Other | Admitting: Physical Therapy

## 2018-05-02 ENCOUNTER — Inpatient Hospital Stay (HOSPITAL_COMMUNITY): Payer: Medicare Other | Admitting: *Deleted

## 2018-05-02 LAB — CBC WITH DIFFERENTIAL/PLATELET
ABS IMMATURE GRANULOCYTES: 0 10*3/uL (ref 0.0–0.1)
BASOS ABS: 0.1 10*3/uL (ref 0.0–0.1)
Basophils Relative: 1 %
Eosinophils Absolute: 0.1 10*3/uL (ref 0.0–0.7)
Eosinophils Relative: 2 %
HEMATOCRIT: 30.9 % — AB (ref 39.0–52.0)
HEMOGLOBIN: 8.9 g/dL — AB (ref 13.0–17.0)
Immature Granulocytes: 1 %
LYMPHS ABS: 1.9 10*3/uL (ref 0.7–4.0)
LYMPHS PCT: 32 %
MCH: 23.8 pg — AB (ref 26.0–34.0)
MCHC: 28.8 g/dL — ABNORMAL LOW (ref 30.0–36.0)
MCV: 82.6 fL (ref 78.0–100.0)
MONO ABS: 0.8 10*3/uL (ref 0.1–1.0)
Monocytes Relative: 14 %
NEUTROS ABS: 3 10*3/uL (ref 1.7–7.7)
Neutrophils Relative %: 50 %
Platelets: 344 10*3/uL (ref 150–400)
RBC: 3.74 MIL/uL — AB (ref 4.22–5.81)
RDW: 19 % — ABNORMAL HIGH (ref 11.5–15.5)
WBC: 5.9 10*3/uL (ref 4.0–10.5)

## 2018-05-02 LAB — GLUCOSE, CAPILLARY
GLUCOSE-CAPILLARY: 164 mg/dL — AB (ref 65–99)
GLUCOSE-CAPILLARY: 135 mg/dL — AB (ref 65–99)
GLUCOSE-CAPILLARY: 134 mg/dL — AB (ref 65–99)
GLUCOSE-CAPILLARY: 131 mg/dL — AB (ref 65–99)
Glucose-Capillary: 160 mg/dL — ABNORMAL HIGH (ref 65–99)

## 2018-05-02 LAB — BASIC METABOLIC PANEL
ANION GAP: 9 (ref 5–15)
BUN: 24 mg/dL — AB (ref 6–20)
CHLORIDE: 105 mmol/L (ref 101–111)
CO2: 26 mmol/L (ref 22–32)
Calcium: 8.8 mg/dL — ABNORMAL LOW (ref 8.9–10.3)
Creatinine, Ser: 1.02 mg/dL (ref 0.61–1.24)
GFR calc Af Amer: >60 mL/min (ref >60)
GFR calc non Af Amer: >60 mL/min (ref >60)
GLUCOSE: 133 mg/dL — AB (ref 65–99)
POTASSIUM: 4.3 mmol/L (ref 3.5–5.1)
Sodium: 140 mmol/L (ref 135–145)

## 2018-05-02 NOTE — Progress Notes (Signed)
Occupational Therapy Session Note  Patient Details  Name: Casey Gilmore MRN: [id number] Date of Birth: 11-02-1941  Today's Date: 05/02/2018 OT Individual Time: 7494-4967 OT Individual Time Calculation (min): 45 min    Short Term Goals: Week 1:  OT Short Term Goal 1 (Week 1): Pt will complete sit > stand with mod assist of 1 caregiver in preparation to complete LB dressing at sit > stand level OT Short Term Goal 2 (Week 1): Pt will complete bathing with mod assist OT Short Term Goal 3 (Week 1): Pt will complete toilet transfer with mod assist of 1 caregiver OT Short Term Goal 4 (Week 1): Pt will complete LB dressing with max assist and use of AE as needed Week 2:     Skilled Therapeutic Interventions/Progress Updates:    1:1 Pt in bed resting when arrived. Focus on bed mobility and transfers in session. First elevated head of bed as much as pt could tolerate before beginning to move.   Came off right side of the bed with more than reasonable amt of time with A for left LE adduction. Max A to come into modified sitting position (leaning towards the right). After rest break came into standing with max A +2 with pt pulling up on stabilized RW. Pt able to maintain standing position with min A with RW still being stabilized by rehab tech. With total instructional cues pt able to turn RW and pivot on right LE to transfer into reclining w/c. Pt able to performed a controlled decent into the w/c with both hands back on the arm rests with mod A with extra time. Positioned with elevating LE rest to pt's comfort. Pt participated in shaving and washing face but required assist to finish shaving due to fatigue. Left resting in w/c with safety belt.   Therapy Documentation Precautions:  Precautions Precautions: Fall Restrictions Weight Bearing Restrictions: Yes LLE Weight Bearing: Touchdown weight bearing Other Position/Activity Restrictions: No ROM restrictions noted in note w/ hip, however pt  reports pt has been limited to ~45 deg of L hip flexion since a hip surgery he had in the 1950s Pain: Pain in left Le with movement and some positioning. Allowed for extra time and assisted with positioning  See Function Navigator for Current Functional Status.   Therapy/Group: Individual Therapy  Willeen [facility] 05/02/2018, 9:49 AM

## 2018-05-02 NOTE — Progress Notes (Signed)
Physical Therapy Weekly Progress Note  Patient Details  Name: Casey Gilmore MRN: [id number] Date of Birth: 12/16/1940  Beginning of progress report period: Apr 25, 2018 End of progress report period: May 02, 2018  Today's Date: 05/02/2018 PT Individual Time: 1335-1430 PT Individual Time Calculation (min): 55 min   Patient has met 1 of 4 short term goals.  Pt has made slower than anticipated gains this week as pt has been limited by pain with most mobility. Today pt has demonstrated some improvement with tolerance to activity by performing standing at parallel bars and able to maintain standing for >16mn. Pt has initiated stand pivot transfers and was able to take x 1 step while maintaining TDWB.   Patient continues to demonstrate the following deficits muscle weakness and therefore will continue to benefit from skilled PT intervention to increase functional independence with mobility.  Patient progressing toward long term goals..  Continue plan of care.  PT Short Term Goals Week 1:  PT Short Term Goal 1 (Week 1): Pt will initiate gait training w/ LRAD PT Short Term Goal 1 - Progress (Week 1): Partly met PT Short Term Goal 2 (Week 1): Pt will transfer bed<>chair w/ mod assist PT Short Term Goal 2 - Progress (Week 1): Progressing toward goal PT Short Term Goal 3 (Week 1): Pt will initiate w/c self-propulsion training PT Short Term Goal 3 - Progress (Week 1): Met PT Short Term Goal 4 (Week 1): Pt will perform bed mobility w/ mod assist PT Short Term Goal 4 - Progress (Week 1): Progressing toward goal Week 2:  PT Short Term Goal 1 (Week 2): Pt will perform bed mobility with mod assist consistently  PT Short Term Goal 2 (Week 2): Pt will ambuate 169fwith mod-max assist and LRAD  PT Short Term Goal 3 (Week 2): Pt will propell WC [id number] supervision assist consistently  PT Short Term Goal 4 (Week 2): PT will perform sit<>stand with mod assist of 1 consistently   Skilled Therapeutic  Interventions/Progress Updates: Pt presented in w/c agreeable to therapy. Pt noted to have LLE resting on ground. Per pt no significant increase in pain at this time. Pt transported to rehab gym total A and taken to parallel bars. With increased time to allow for pt positioning pt participated in sit to stand in parallel bars and able to maintain standing x approx 2 min. Pt able to take RUE off support bar and maintain balance with minA. Pt noted to have heavy use of BUE causing fatigue in said extremities. Pt able to take one small backwards step maintaining TDWB status to return to w/c. Pt required maxA x 2 to return to parallel bars. Pt returned to room and performed sit to stand from w/c maxA x 2 and performed squat pivot to elevated bed minA. Stand to sit maxA x 2 with total assist x2 sit to supine. Pt able to perform small bridge to allow PTA to reposition draw sheet. Pt remained in ved at end of session with bed alarm on, call bell within reach and needs met.      Therapy Documentation Precautions:  Precautions Precautions: Fall Restrictions Weight Bearing Restrictions: Yes LLE Weight Bearing: Touchdown weight bearing Other Position/Activity Restrictions: No ROM restrictions noted in note w/ hip, however pt reports pt has been limited to ~45 deg of L hip flexion since a hip surgery he had in the 1950s General:   Vital Signs: Therapy Vitals Temp: 97.7 F (36.5 C) Temp Source:  Oral Pulse Rate: 86 Resp: 14 BP: 109/61 Patient Position (if appropriate): Lying Oxygen Therapy SpO2: 98 % O2 Device: Room Air Pain: Pain Assessment Pain Score: 4       See Function Navigator for Current Functional Status.  Therapy/Group: Individual Therapy  Rosita DeChalus 05/02/2018, 4:32 PM

## 2018-05-02 NOTE — Progress Notes (Signed)
[place] PHYSICAL MEDICINE & REHABILITATION     PROGRESS NOTE  Subjective/Complaints:  Patient seen sitting up in bed this morning.  He states he slept well overnight.  ROS: Denies CP, SOB, nausea, vomiting, diarrhea.  Objective: Vital Signs: Blood pressure 124/78, pulse 87, temperature 97.7 F (36.5 C), temperature source Oral, resp. rate 12, weight 78.7 kg (173 lb 8 oz), SpO2 97 %. No results found. Recent Labs    05/02/18 0645  WBC 5.9  HGB 8.9*  HCT 30.9*  PLT 344   Recent Labs    05/01/18 0457 05/02/18 0645  NA  --  140  K  --  4.3  CL  --  105  GLUCOSE  --  133*  BUN  --  24*  CREATININE 1.01 1.02  CALCIUM  --  8.8*   CBG (last 3)  Recent Labs    05/01/18 1207 05/01/18 1656 05/02/18 0730  GLUCAP 123* 103* 135*    Wt Readings from Last 3 Encounters:  04/24/18 78.7 kg (173 lb 8 oz)  04/22/18 81.3 kg (179 lb 3.7 oz)  01/19/18 88.5 kg (195 lb)    Physical Exam:  BP 124/78 (BP Location: Left Arm)   Pulse 87   Temp 97.7 F (36.5 C) (Oral)   Resp 12   Wt 78.7 kg (173 lb 8 oz)   SpO2 97%   BMI 23.53 kg/m  Constitutional: He appears well-developed and well-nourished. NAD. HENT: Normocephalic and atraumatic.  Eyes: EOM are normal. No discharge.  Cardiovascular: RRR. No JVD.  Respiratory: Effort normal and breath sounds normal.  GI: Bowel sounds are normal. He exhibits no distension.  Musculoskeletal: Left hip edema and tenderness, improving  Neurological: He is alert and oriented.  Motor: Bilateral upper extremities: 4+/5 proximal to distal Right lower extremity: Hip flexion, knee extension 4/5, ankle dorsiflexion 4+/5 Left lower extremity: Hip flexion, knee extension 1+/5, ankle dorsiflexion 4+/5 (stable) Skin: Surgical site c/d/i Psychiatric: He has a normal mood and affect. His behavior is normal.   Assessment/Plan: 1. Functional deficits secondary to left hip fracture which require 3+ hours per day of interdisciplinary therapy in a  comprehensive inpatient rehab setting. Physiatrist is providing close team supervision and 24 hour management of active medical problems listed below. Physiatrist and rehab team continue to assess barriers to discharge/monitor patient progress toward functional and medical goals.  Function:  Bathing Bathing position   Position: Bed(sitting EOB for UB)  Bathing parts Body parts bathed by patient: Right arm, Left arm, Chest, Abdomen, Front perineal area Body parts bathed by helper: Buttocks, Right upper leg, Left upper leg, Right lower leg, Left lower leg, Back  Bathing assist Assist Level: (Mod assist)      Upper Body Dressing/Undressing Upper body dressing   What is the patient wearing?: Button up shirt         Button up shirt - Perfomed by patient: Thread/unthread right sleeve Button up shirt - Perfomed by helper: Thread/unthread left sleeve, Pull shirt around back, Button/unbutton shirt    Upper body assist Assist Level: (Max assist)      Lower Body Dressing/Undressing Lower body dressing   What is the patient wearing?: Pants, Non-skid slipper socks   Underwear - Performed by helper: Thread/unthread right underwear leg, Thread/unthread left underwear leg, Pull underwear up/down   Pants- Performed by helper: Thread/unthread right pants leg, Thread/unthread left pants leg, Pull pants up/down   Non-skid slipper socks- Performed by helper: Don/doff right sock, Don/doff left sock  Lower body assist Assist for lower body dressing: (Total assist)      Toileting Toileting   Toileting steps completed by patient: Adjust clothing prior to toileting, Performs perineal hygiene Toileting steps completed by helper: Adjust clothing prior to toileting, Performs perineal hygiene, Adjust clothing after toileting    Toileting assist Assist level: Touching or steadying assistance (Pt.75%)   Transfers Chair/bed transfer   Chair/bed transfer method:  Anterior/posterior Chair/bed transfer assist level: 2 helpers Chair/bed transfer assistive device: Bedrails     Locomotion Ambulation Ambulation activity did not occur: Safety/medical Editor, commissioning activity did not occur: Safety/medical concerns Type: Manual Max wheelchair distance: 75 Assist Level: Touching or steadying assistance (Pt > 75%)  Cognition Comprehension Comprehension assist level: Follows basic conversation/direction with no assist  Expression Expression assist level: Expresses complex ideas: With no assist  Social Interaction Social Interaction assist level: Interacts appropriately 75 - 89% of the time - Needs redirection for appropriate language or to initiate interaction.  Problem Solving Problem solving assist level: Solves basic 90% of the time/requires cueing < 10% of the time  Memory Memory assist level: Recognizes or recalls 75 - 89% of the time/requires cueing 10 - 24% of the time    Medical Problem List and Plan: 1.  Decreased functional mobility secondary to left subtrochanteric femur fracture, retained hip fusion hardware placed in the 1950s and broken hardware status post ORIF 04/18/2018.  Touchdown weightbearing x8 weeks   Continue CIR  2.  DVT Prophylaxis/Anticoagulation: Subcutaneous Lovenox.     Vascular study negative 3. Pain Management: Oxycodone as needed, frequency decreased on 5/23  Robaxin when necessary started on 5/23   Unclear picture regarding home narcotic regimen 4. Mood: Provide emotional support 5. Neuropsych: This patient is capable of making decisions on his own behalf. 6. Skin/Wound Care: Routine skin checks  DCed staples on 5/27 7. Fluids/Electrolytes/Nutrition: Routine in and outs    BMP within acceptable range on 5/21, creatinine within normal limits on 5/28 8.  Acute on chronic anemia.  Continue iron supplement   Hemoglobin 8.9 on 5/28   Continue to monitor 9.  Recent fall with left humeral and  radial/ulnar fractures. 10.  Type 2 diabetes mellitus.  Hemoglobin A1c 7.6.  SSI.  Check blood sugars before meals and at bedtime (on metformin 1000 twice a day, Amaryl 4 mg daily PTA).   Metformin 500 twice a day started on 5/22, increased to 1000mg  on 5/24   Amaryl 1 mg started on 5/27  CBG (last 3)  Recent Labs    05/01/18 1207 05/01/18 1656 05/02/18 0730  GLUCAP 123* 103* 135*   Improving on 5/28 11.  Hyperlipidemia.  Pravachol 12.  History of severe aortic stenosis.  Status post bioprosthetic AVR 2012. 13.  History of GI bleed.  Continue Protonix   See #8 14. Hypoalbuminemia   Supplement initiated on 5/21 15. Constipation  Bowel regiment increased on 5/23   LOS (Days) 8 A FACE TO FACE EVALUATION WAS PERFORMED  Ankit [name] 05/02/2018 8:36 AM

## 2018-05-02 NOTE — Progress Notes (Signed)
Physical Therapy Session Note  Patient Details  Name: Casey Gilmore MRN: [id number] Date of Birth: July 16, 1941  Today's Date: 05/02/2018 PT Individual Time: 1005-1030 PT Individual Time Calculation (min): 25 min   Short Term Goals: Week 1:  PT Short Term Goal 1 (Week 1): Pt will initiate gait training w/ LRAD PT Short Term Goal 2 (Week 1): Pt will transfer bed<>chair w/ mod assist PT Short Term Goal 3 (Week 1): Pt will initiate w/c self-propulsion training PT Short Term Goal 4 (Week 1): Pt will perform bed mobility w/ mod assist  Skilled Therapeutic Interventions/Progress Updates:   Pt in w/c and agreeable to therapy, pain 5/10 in L hip. Session focused on functional mobility. Total assist w/c transport to gym for time management. Planned to perform multiple sit<>stand reps for practice to RW, however was only able to perform 1 sit<>stand to RW w/ max-total assist x2 helpers. Pt required increased time to complete transfer due to preparing mentally and pain. Able to tolerate standing for 45-60 sec before return to sit. Pt self-propelled w/c back towards room w/ increased time, 75' w/ supervision using BUEs. Total assist remainder of way. Ended session in w/c, call bell within reach and all needs met.   Therapy Documentation Precautions:  Precautions Precautions: Fall Restrictions Weight Bearing Restrictions: Yes LLE Weight Bearing: Touchdown weight bearing Other Position/Activity Restrictions: No ROM restrictions noted in note w/ hip, however pt reports pt has been limited to ~45 deg of L hip flexion since a hip surgery he had in the 1950s Pain: Pain Assessment Pain Scale: 0-10 Pain Score: 0-No pain  See Function Navigator for Current Functional Status.   Therapy/Group: Individual Therapy  Kamori Barbier K Arnette 05/02/2018, 10:44 AM

## 2018-05-02 NOTE — Progress Notes (Signed)
Occupational Therapy Weekly Progress Note  Patient Details  Name: Casey Gilmore MRN: [id number] Date of Birth: September 26, 1941  Beginning of progress report period: Apr 25, 2018 End of progress report period: May 02, 2018  Today's Date: 05/02/2018 OT Individual Time: 1100-1155 OT Individual Time Calculation (min): 55 min    Patient has met 1 of 4 short term goals. Pt is making slow progress towards goals.  Pt progress is limited due to pain and extreme fearfulness and guarding with any transitional movement.  Pt currently requires +2 for transfers either with slide board or stand pivot with RW.  Pt is demonstrating improved sit > stand and standing tolerance with UE support on RW.  Have provided pt with AE to increase participation with LB bathing and dressing; pt will require continued focus to increase independence with AE.  Pt requires +2 for LB bathing and dressing at sit > stand level, however this has shown to be more functional than bed level with decreased pain and anxiety at sit > stand level.    Patient continues to demonstrate the following deficits: muscle weakness, decreased initiation and decreased memory and decreased sitting balance, decreased standing balance, decreased postural control and decreased balance strategies and therefore will continue to benefit from skilled OT intervention to enhance overall performance with BADL and Reduce care partner burden.  Patient progressing toward long term goals..  Continue plan of care.  OT Short Term Goals Week 1:  OT Short Term Goal 1 (Week 1): Pt will complete sit > stand with mod assist of 1 caregiver in preparation to complete LB dressing at sit > stand level OT Short Term Goal 1 - Progress (Week 1): Progressing toward goal OT Short Term Goal 2 (Week 1): Pt will complete bathing with mod assist OT Short Term Goal 2 - Progress (Week 1): Met OT Short Term Goal 3 (Week 1): Pt will complete toilet transfer with mod assist of 1  caregiver OT Short Term Goal 3 - Progress (Week 1): Progressing toward goal OT Short Term Goal 4 (Week 1): Pt will complete LB dressing with max assist and use of AE as needed OT Short Term Goal 4 - Progress (Week 1): Progressing toward goal Week 2:  OT Short Term Goal 1 (Week 2): Pt will complete sit > stand with mod assist of 1 caregiver in preparation to complete LB dressing at sit > stand level OT Short Term Goal 2 (Week 2): Pt will complete toilet transfer with mod assist of 1 caregiver OT Short Term Goal 3 (Week 2): Pt will complete LB dressing with max assist and use of AE as needed  Skilled Therapeutic Interventions/Progress Updates:    Treatment session with focus on self-care retraining with bathing, LB dressing, and standing tolerance.  Pt received upright in w/c willing to engage in bathing/dressing.  Educated pt on use of long handled sponge and reacher to decrease burden of care with LB bathing and dressing.  Completed sit > stand with mod assist +2 with pt pushing up through RW while it was stabilized by 2nd person (rehab tech).  Pt tolerated standing 2-3 mins with min-mod assist from therapist while 2nd person assisted with doffing pants and washing buttocks.  Engaged in sit > stand x 3 throughout session with pt demonstrating increased control with sit <> stand even reaching back towards arm rests on w/c to assist with lowering down to sitting.  Pt required assist with threading pants, despite attempts to utilize reacher to decrease burden  of care - will benefit from additional practice.    Therapy Documentation Precautions:  Precautions Precautions: Fall Restrictions Weight Bearing Restrictions: Yes LLE Weight Bearing: Touchdown weight bearing Other Position/Activity Restrictions: No ROM restrictions noted in note w/ hip, however pt reports pt has been limited to ~45 deg of L hip flexion since a hip surgery he had in the 1950s General:   Vital Signs:   Pain: Pain  Assessment Pain Scale: 0-10 Pain Score: 7  Pain Type: Surgical pain Pain Location: Hip Pain Orientation: Left Pain Descriptors / Indicators: Aching Pain Onset: Gradual Pain Intervention(s): Medication (See eMAR)  See Function Navigator for Current Functional Status.   Therapy/Group: Individual Therapy  [name] 05/02/2018, 12:57 PM

## 2018-05-03 ENCOUNTER — Inpatient Hospital Stay (HOSPITAL_COMMUNITY): Payer: Medicare Other | Admitting: Physical Therapy

## 2018-05-03 ENCOUNTER — Inpatient Hospital Stay (HOSPITAL_COMMUNITY): Payer: Medicare Other

## 2018-05-03 ENCOUNTER — Inpatient Hospital Stay (HOSPITAL_COMMUNITY): Payer: Medicare Other | Admitting: Occupational Therapy

## 2018-05-03 LAB — GLUCOSE, CAPILLARY
GLUCOSE-CAPILLARY: 114 mg/dL — AB (ref 65–99)
GLUCOSE-CAPILLARY: 97 mg/dL (ref 65–99)
GLUCOSE-CAPILLARY: 85 mg/dL (ref 65–99)
Glucose-Capillary: 158 mg/dL — ABNORMAL HIGH (ref 65–99)

## 2018-05-03 MED ORDER — FUROSEMIDE 40 MG PO TABS
40.0000 mg | ORAL_TABLET | Freq: Every day | ORAL | Status: DC
  Administered 2018-05-03 – 2018-05-12 (×10): 40 mg via ORAL
  Filled 2018-05-03 (×10): qty 1

## 2018-05-03 NOTE — Plan of Care (Signed)
  Problem: Consults Goal: RH GENERAL PATIENT EDUCATION Description See Patient Education module for education specifics. Outcome: Progressing   Problem: RH BOWEL ELIMINATION Goal: RH STG MANAGE BOWEL WITH ASSISTANCE Description STG Manage Bowel with Min.Assistance.  Outcome: Progressing Goal: RH STG MANAGE BOWEL W/MEDICATION W/ASSISTANCE Description STG Manage Bowel with Medication with Min.Assistance.  Outcome: Progressing   Problem: RH BLADDER ELIMINATION Goal: RH STG MANAGE BLADDER WITH ASSISTANCE Description STG Manage Bladder With Min. Assistance  Outcome: Progressing   Problem: RH SKIN INTEGRITY Goal: RH STG SKIN FREE OF INFECTION/BREAKDOWN Description With min. Assist.  Outcome: Progressing Goal: RH STG MAINTAIN SKIN INTEGRITY WITH ASSISTANCE Description STG Maintain Skin Integrity With Min. Assistance.  Outcome: Progressing   Problem: RH SAFETY Goal: RH STG ADHERE TO SAFETY PRECAUTIONS W/ASSISTANCE/DEVICE Description STG Adhere to Safety Precautions With Mod.Assistance/Device.  Outcome: Progressing Goal: RH STG DECREASED RISK OF FALL WITH ASSISTANCE Description STG Decreased Risk of Fall With Mod. Assistance.  Outcome: Progressing   Problem: RH PAIN MANAGEMENT Goal: RH STG PAIN MANAGED AT OR BELOW PT'S PAIN GOAL Description Less than 3,on 1 to 10 scale  Outcome: Progressing

## 2018-05-03 NOTE — Progress Notes (Signed)
Physical Therapy Session Note  Patient Details  Name: Casey Gilmore MRN: [id number] Date of Birth: 09/25/1941  Today's Date: 05/03/2018 PT Individual Time: 0830-0930 PT Individual Time Calculation (min): 60 min   Short Term Goals: Week 1:  PT Short Term Goal 1 (Week 1): Pt will initiate gait training w/ LRAD PT Short Term Goal 1 - Progress (Week 1): Partly met PT Short Term Goal 2 (Week 1): Pt will transfer bed<>chair w/ mod assist PT Short Term Goal 2 - Progress (Week 1): Progressing toward goal PT Short Term Goal 3 (Week 1): Pt will initiate w/c self-propulsion training PT Short Term Goal 3 - Progress (Week 1): Met PT Short Term Goal 4 (Week 1): Pt will perform bed mobility w/ mod assist PT Short Term Goal 4 - Progress (Week 1): Progressing toward goal Week 2:      Skilled Therapeutic Interventions/Progress Updates:      Pt received supine in bed and agreeable to PT. Supine>sit transfer with mod-max assist from PT and moderate cues from PT for use of the RUE to stablize on bed.   Sit<>stand x 2 EOB with max assist +2. Moderate cues from PT for technique and proper UE placement to allow reduced pain and increased independence.   PT instructed pt in gait training x 4 ft with Max Assist +2 for safety. Max cues from PT use of UE on RW to prevent excessive WB   WC mobility x 187f with supervisison assist with min cues improved ROM to allow equal force through UE.   Sit<>stand from WC x 2 with max assist and +2 for safety. [facility]  By PT with Max assist of 1 with max cues from PT for UE and LE placement. Pt able to control the LLE into standing with TDWB without additional assist.   [facility] and all needs met.       Therapy Documentation Precautions:  Precautions Precautions: Fall Restrictions Weight Bearing Restrictions: Yes LLE Weight Bearing: Touchdown weight bearing Other  Position/Activity Restrictions: No ROM restrictions noted in note w/ hip, however pt reports pt has been limited to ~45 deg of L hip flexion since a hip surgery he had in the 1950s Pain: Pain Assessment Pain Scale: 0-10 Pain Score: 6  Pain Type: Acute pain Pain Location: Leg Pain Orientation: Left Pain Descriptors / Indicators: Aching Pain Frequency: Constant Pain Onset: On-going Patients Stated Pain Goal: 4 Pain Intervention(s): Medication (See eMAR) Multiple Pain Sites: No  See Function Navigator for Current Functional Status.   Therapy/Group: Individual Therapy  ALorie Phenix5/29/2019, 9:21 AM

## 2018-05-03 NOTE — Discharge Instructions (Signed)
Inpatient Rehab Discharge Instructions  Casey Gilmore Discharge date and time: No discharge date for patient encounter.   Activities/Precautions/ Functional Status: Activity: Touchdown weightbearing left lower extremity Diet: diabetic diet Wound Care: keep wound clean and dry Functional status:  ___ No restrictions     ___ Walk up steps independently ___ 24/7 supervision/assistance   ___ Walk up steps with assistance ___ Intermittent supervision/assistance  ___ Bathe/dress independently ___ Walk with walker     _x__ Bathe/dress with assistance ___ Walk Independently    ___ Shower independently ___ Walk with assistance    ___ Shower with assistance ___ No alcohol     ___ Return to work/school ________  Special Instructions:  No driving  My questions have been answered and I understand these instructions. I will adhere to these goals and the provided educational materials after my discharge from the hospital.  Patient/Caregiver Signature _______________________________ Date __________  Clinician Signature _______________________________________ Date __________  Please bring this form and your medication list with you to all your follow-up doctor's appointments.

## 2018-05-03 NOTE — Progress Notes (Signed)
Occupational Therapy Session Note  Patient Details  Name: Casey Gilmore MRN: [id number] Date of Birth: 1941/10/08  Today's Date: 05/03/2018 OT Individual Time: 1004-1100 and 1345-1430 OT Individual Time Calculation (min): 56 min and 45 min   Short Term Goals: Week 2:  OT Short Term Goal 1 (Week 2): Pt will complete sit > stand with mod assist of 1 caregiver in preparation to complete LB dressing at sit > stand level OT Short Term Goal 2 (Week 2): Pt will complete toilet transfer with mod assist of 1 caregiver OT Short Term Goal 3 (Week 2): Pt will complete LB dressing with max assist and use of AE as needed  Skilled Therapeutic Interventions/Progress Updates:    1) Treatment session with focus on sit > stand and activity tolerance.  Pt received upright in w/c declining bathing/dressing but willing to engage in therapy session.  Propelled pt to therapy gym where he reports "fuzzy" feeling.  BP 107/78.  Pt reports need to void urine, returned to room and utilized urinal with setup assist.  Engaged in sit > stand in room with +2 and use of RW.  Pt required assist to sit forward in w/c, then was able to push up from w/c with RUE while LUE on RW with mod assist from therapist and min assist from 2nd person.  Pt able to adjust posture into full upright standing with increased time and min assist for support.  Pt engaged in hip movements in standing with small A/P and lateral swings.  Pt tolerated standing ~3 mins before requesting to sit down.  Pt stood 2 more times as above with increased weight shift during sit > stand.  Progressed to picking up Rt and then Lt hand while standing to work towards being able to complete clothing management in standing.  Pt reports feeling light headed again, returned to sitting with assist to control descent.  Pt reports feeling better once seated, left seated upright with brother in room and all needs in reach.  2) Treatment session with focus on toilet transfers.   Pt reports having handicap height toilet at home but willing to attempt transfer to [facility] over toilet this session.  Completed sit > stand from w/c with mod assist +2 for safety.  Pt required increased time and max encouragement for sequencing of stand pivot transfer to [facility] over toilet.  Pt able to take 2-3 steps forward with RW and then required increased cues and reassurance to pivot Rt foot backward to sit on toilet. 2nd person assisted with clothing management while this therapist assisted with upright posture.  Pt able to reach back for arm rest to lower self down to attempt toileting.  Required increased time to attempt BM without success.  Pt able to complete sit > stand and transfer back to w/c with mod assist of 1 this session with increased time and cues for sequencing.    Therapy Documentation Precautions:  Precautions Precautions: Fall Restrictions Weight Bearing Restrictions: Yes LLE Weight Bearing: Touchdown weight bearing Other Position/Activity Restrictions: No ROM restrictions noted in note w/ hip, however pt reports pt has been limited to ~45 deg of L hip flexion since a hip surgery he had in the 1950s Pain: Pain Assessment Pain Scale: 0-10 Pain Score: 4  Pain Type: Acute pain Pain Location: Leg Pain Orientation: Left Pain Descriptors / Indicators: Aching Pain Frequency: Constant Pain Onset: On-going Patients Stated Pain Goal: 4 Pain Intervention(s): Medication (See eMAR)  See Function Navigator for Current Functional Status.  Therapy/Group: Individual Therapy  [name] 05/03/2018, 12:11 PM

## 2018-05-03 NOTE — Progress Notes (Signed)
[place] PHYSICAL MEDICINE & REHABILITATION     PROGRESS NOTE  Subjective/Complaints:  Patient seen lying in bed this morning. He states he slept well overnight. He denies complaints.  ROS: denies CP, SOB, nausea, vomiting, diarrhea.  Objective: Vital Signs: Blood pressure 125/66, pulse 75, temperature 98.3 F (36.8 C), temperature source Oral, resp. rate 17, weight 78.7 kg (173 lb 8 oz), SpO2 96 %. No results found. Recent Labs    05/02/18 0645  WBC 5.9  HGB 8.9*  HCT 30.9*  PLT 344   Recent Labs    05/01/18 0457 05/02/18 0645  NA  --  140  K  --  4.3  CL  --  105  GLUCOSE  --  133*  BUN  --  24*  CREATININE 1.01 1.02  CALCIUM  --  8.8*   CBG (last 3)  Recent Labs    05/02/18 1629 05/02/18 2156 05/03/18 0649  GLUCAP 134* 131* 114*    Wt Readings from Last 3 Encounters:  04/24/18 78.7 kg (173 lb 8 oz)  04/22/18 81.3 kg (179 lb 3.7 oz)  01/19/18 88.5 kg (195 lb)    Physical Exam:  BP 125/66 (BP Location: Left Arm)   Pulse 75   Temp 98.3 F (36.8 C) (Oral)   Resp 17   Wt 78.7 kg (173 lb 8 oz)   SpO2 96%   BMI 23.53 kg/m  Constitutional: He appears well-developed and well-nourished. NAD. HENT: Normocephalic and atraumatic.  Eyes: EOM are normal. No discharge.  Cardiovascular: RRR. No JVD.  Respiratory: Effort normal and breath sounds normal.  GI: Bowel sounds are normal. He exhibits no distension.  Musculoskeletal: Left hip edema and tenderness, improving  Neurological: He is alert and oriented.  Motor:  Right lower extremity: Hip flexion, knee extension 4/5, ankle dorsiflexion 4+/5 Left lower extremity: Hip flexion, knee extension 1+/5, ankle dorsiflexion 4+/5 (unchanged) Skin: Surgical site c/d/i Psychiatric: He has a normal mood and affect. His behavior is normal.   Assessment/Plan: 1. Functional deficits secondary to left hip fracture which require 3+ hours per day of interdisciplinary therapy in a comprehensive inpatient rehab  setting. Physiatrist is providing close team supervision and 24 hour management of active medical problems listed below. Physiatrist and rehab team continue to assess barriers to discharge/monitor patient progress toward functional and medical goals.  Function:  Bathing Bathing position   Position: Wheelchair/chair at sink  Bathing parts Body parts bathed by patient: Right arm, Left arm, Chest, Abdomen, Front perineal area, Right upper leg, Left upper leg Body parts bathed by helper: Buttocks, Back  Bathing assist Assist Level: 2 helpers      Upper Body Dressing/Undressing Upper body dressing   What is the patient wearing?: Button up shirt         Button up shirt - Perfomed by patient: Thread/unthread right sleeve Button up shirt - Perfomed by helper: Thread/unthread left sleeve, Pull shirt around back, Button/unbutton shirt    Upper body assist Assist Level: (Max assist)      Lower Body Dressing/Undressing Lower body dressing   What is the patient wearing?: Pants, Shoes   Underwear - Performed by helper: Thread/unthread right underwear leg, Thread/unthread left underwear leg, Pull underwear up/down   Pants- Performed by helper: Thread/unthread right pants leg, Thread/unthread left pants leg, Pull pants up/down   Non-skid slipper socks- Performed by helper: Don/doff left sock       Shoes - Performed by helper: Don/doff right shoe, Fasten right  Lower body assist Assist for lower body dressing: 2 Helpers      Toileting Toileting   Toileting steps completed by patient: Adjust clothing prior to toileting, Performs perineal hygiene Toileting steps completed by helper: Adjust clothing prior to toileting, Performs perineal hygiene, Adjust clothing after toileting    Toileting assist Assist level: Touching or steadying assistance (Pt.75%)   Transfers Chair/bed transfer   Chair/bed transfer method: Stand pivot Chair/bed transfer assist level: Maximal assist  (Pt 25 - 49%/lift and lower) Chair/bed transfer assistive device: Medical sales representative Ambulation activity did not occur: Safety/medical Editor, commissioning activity did not occur: Safety/medical concerns Type: Manual Max wheelchair distance: 41' Assist Level: Supervision or verbal cues  Cognition Comprehension Comprehension assist level: Follows basic conversation/direction with no assist  Expression Expression assist level: Expresses complex ideas: With no assist  Social Interaction Social Interaction assist level: Interacts appropriately 75 - 89% of the time - Needs redirection for appropriate language or to initiate interaction.  Problem Solving Problem solving assist level: Solves basic 90% of the time/requires cueing < 10% of the time  Memory Memory assist level: Recognizes or recalls 75 - 89% of the time/requires cueing 10 - 24% of the time    Medical Problem List and Plan: 1.  Decreased functional mobility secondary to left subtrochanteric femur fracture, retained hip fusion hardware placed in the 1950s and broken hardware status post ORIF 04/18/2018.  Touchdown weightbearing x8 weeks   Continue CIR  2.  DVT Prophylaxis/Anticoagulation: Subcutaneous Lovenox.     Vascular study negative 3. Pain Management: Oxycodone as needed, frequency decreased on 5/23  Robaxin when necessary started on 5/23   Unclear picture regarding home narcotic regimen   Relatively controlled at present 4. Mood: Provide emotional support 5. Neuropsych: This patient is capable of making decisions on his own behalf. 6. Skin/Wound Care: Routine skin checks  DCed staples on 5/27 7. Fluids/Electrolytes/Nutrition: Routine in and outs    BMP within acceptable range on 5/21, creatinine within normal limits on 5/28 8.  Acute on chronic anemia.  Continue iron supplement   Hemoglobin 8.9 on 5/28   Continue to monitor 9.  Recent fall with left humeral and radial/ulnar  fractures. 10.  Type 2 diabetes mellitus.  Hemoglobin A1c 7.6.  SSI.  Check blood sugars before meals and at bedtime (on metformin 1000 twice a day, Amaryl 4 mg daily PTA).   Metformin 500 twice a day started on 5/22, increased to 1000mg  on 5/24   Amaryl 1 mg started on 5/27  CBG (last 3)  Recent Labs    05/02/18 1629 05/02/18 2156 05/03/18 0649  GLUCAP 134* 131* 114*   Improving on 5/29 11.  Hyperlipidemia.  Pravachol 12.  History of severe aortic stenosis.  Status post bioprosthetic AVR 2012. 13.  History of GI bleed.  Continue Protonix   See #8 14. Hypoalbuminemia   Supplement initiated on 5/21 15. Constipation  Bowel regiment increased on 5/23   LOS (Days) 9 A FACE TO FACE EVALUATION WAS PERFORMED  Paulette Rockford [name] 05/03/2018 8:36 AM

## 2018-05-03 NOTE — Progress Notes (Signed)
Physical Therapy Note  Patient Details  Name: Casey Gilmore MRN: [id number] Date of Birth: [birth year] Today's Date: 05/03/2018  1130-1200, 30 min individual tx Pain: 5/10, L hip; premedicated NWBing LLE   Pt propelled w/c on level tile x 50', using L or R UEs.  W/c pulls L significantly, requiring assistance for steering.  PT switched out L ELR with a longer one to support foot.  Pt stated his hip hurts when foot presses on foot plate; PT indicated that pt should scoot back in w/c PRN; the ELR is long enough.  Pt is more comfortable seated toward L of w/c, to prevent L hip abduction which is painful.  R ELR was lowered to provide more pelvic support and decrease sliding forward in w/c.  Sit>< stand +2 to RW.  Gait training with RW on level tile x 5 RLE hops, wearing shoe, pt needed +2 assistance under arms for last couple of hops.  Seated in w/c, neuro re-ed LLE,  using mirror feedback and Maxislide fabric under L foot, for L heel slides with active assistance, fading to supervision.  Also, L ankle DF/PF to tolerance using mirror, with increasing excursion with practice.    Pt left resting in recliner w/c with quick release belt applied and all needs within reach.  See function navigator for current status.  Rondalyn Belford 05/03/2018, 7:58 AM

## 2018-05-04 ENCOUNTER — Inpatient Hospital Stay (HOSPITAL_COMMUNITY): Payer: Medicare Other | Admitting: Physical Therapy

## 2018-05-04 ENCOUNTER — Inpatient Hospital Stay (HOSPITAL_COMMUNITY): Payer: Medicare Other | Admitting: Occupational Therapy

## 2018-05-04 LAB — GLUCOSE, CAPILLARY
GLUCOSE-CAPILLARY: 120 mg/dL — AB (ref 65–99)
Glucose-Capillary: 137 mg/dL — ABNORMAL HIGH (ref 65–99)
Glucose-Capillary: 169 mg/dL — ABNORMAL HIGH (ref 65–99)
Glucose-Capillary: 117 mg/dL — ABNORMAL HIGH (ref 65–99)
Glucose-Capillary: 140 mg/dL — ABNORMAL HIGH (ref 65–99)

## 2018-05-04 NOTE — Patient Care Conference (Signed)
Inpatient RehabilitationTeam Conference and Plan of Care Update Date: 05/03/2018   Time: 2:35 PM    Patient Name: Casey Gilmore      Medical Record Number: [id number]  Date of Birth: 1941/11/23 Sex: Male         Room/Bed: 4M04C/4M04C-01 Payor Info: Payor: [employer] MEDICARE / Plan: BCBS MEDICARE / Product Type: *No Product type* /    Admitting Diagnosis: L hip FX  Admit Date/Time:  04/24/2018  3:45 PM Admission Comments: No comment available   Primary Diagnosis:  <principal problem not specified> Principal Problem: <principal problem not specified>  Patient Active Problem List   Diagnosis Date Noted  . Chronic pain syndrome   . Constipation due to pain medication   . Postoperative pain   . Hypoalbuminemia due to protein-calorie malnutrition ([place])   . Closed left subtrochanteric femur fracture ([place]) 04/24/2018  . Acute blood loss anemia   . Anemia of chronic disease   . Diabetes mellitus type 2 in nonobese ([facility])   . Hyperlipidemia   . Aortic valve stenosis   . History of GI bleed   . Anemia 04/17/2018  . Closed left hip fracture ([place]) 04/15/2018  . Closed fracture of left proximal humerus 01/19/2018  . Closed fracture of left distal radius and ulna 01/19/2018  . 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 Chronic diastolic heart failure ([place]) 06/24/2014  . Benign essential HTN 06/24/2014  . 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 History of non-ST elevation myocardial infarction (NSTEMI) 06/24/2014  . Diabetes mellitus ([place]) 10/17/2011  . Osteoarthritis 10/17/2011    Expected Discharge Date: Expected Discharge Date: 05/11/18  Team Members Present: Physician leading conference: Dr. Delice Lesch Social Worker Present: Lennart Pall, LCSW Nurse Present: Isla Pence, RN PT Present: Phylliss Bob, PTA;Barrie Folk, PT OT Present: [name], OT PPS  Coordinator present : Daiva Nakayama, RN, CRRN     Current Status/Progress Goal Weekly Team Focus  Medical   Decreased functional mobility secondary to left subtrochanteric femur fracture, retained hip fusion hardware placed in the 1950s and broken hardware status post ORIF 04/18/2018.    Improve mobility, transfers, DM  See above   Bowel/Bladder   continent of b/b, constipation at times, LBM 5/28  pt will remain continent of b/b with mod A  assist with toileting q 2-3 h while awake, administer meds as ordered   Swallow/Nutrition/ Hydration             ADL's   mod assist bathing with +2 to wash buttocks at sit > stand or can complete at bed level with mod assist and increased time to roll, +2 LB dressing at sit > stand level with use of AE.  Min assist overall with AE  ADL retraining, sit > stand, use of AE for bathing and dressing, pain management   Mobility   max to total A bed mobility, maxA x 2 sit to stand, min/modA stand pivot, minA w/c mobilty   Min assist bed mobility, transfers, and supervision assist WC mobility   sit to stand transfers, w/c mobility, transfers   Communication             Safety/Cognition/ Behavioral Observations            Pain   pt with c/o severe pain to left leg, pt reluctant to move left leg at all and does not do touch down weight bearing due to  fear/pain  pt will have pain < 2  assess pain q shift and prn, medicate as ordered   Skin   left hip surgical wound OTA, heels boggy/soft, abdomen bruised  maintain skin integrity with min assist, no infection  assess skin q shift and prn    Rehab Goals Patient on target to meet rehab goals: Yes *See Care Plan and progress notes for long and short-term goals.     Barriers to Discharge  Current Status/Progress Possible Resolutions Date Resolved   Physician    Medical stability;Decreased caregiver support;Inaccessible home environment;Home environment access/layout;Lack of/limited family support;Wound Care      See above  Therapies, follow labs, optimize DM/pain meds      Nursing                  PT                    OT                  SLP                SW                Discharge Planning/Teaching Needs:  Pt's family aware need for 24/7 assistance.  They are discussing to determine if they can provide this vs need for SNF.  TBD dependent on d/c plan.   Team Discussion:  MD trying to determine home pain med regimen and plan to wean down dosing.  Pt has done better the past few days with transfers.  Still +2 for self care/ hygeine/ clothing management.  Pt very fearful on anticipated pain which limits him.  Sit-stand - pivot transfers with max assist.  Ambulated 5' today with +2.  Goals still mod assist gait and min assist transfers overall.  SW to follow up with family about plan for d/c.  Revisions to Treatment Plan:  None    Continued Need for Acute Rehabilitation Level of Care: The patient requires daily medical management by a physician with specialized training in physical medicine and rehabilitation for the following conditions: Daily direction of a multidisciplinary physical rehabilitation program to ensure safe treatment while eliciting the highest outcome that is of practical value to the patient.: Yes Daily medical management of patient stability for increased activity during participation in an intensive rehabilitation regime.: Yes Daily analysis of laboratory values and/or radiology reports with any subsequent need for medication adjustment of medical intervention for : Post surgical problems;Diabetes problems;Other  [name], [place] 05/04/2018, 10:50 AM

## 2018-05-04 NOTE — Plan of Care (Signed)
  Problem: Consults Goal: RH GENERAL PATIENT EDUCATION Description See Patient Education module for education specifics. Outcome: Progressing   Problem: RH BOWEL ELIMINATION Goal: RH STG MANAGE BOWEL WITH ASSISTANCE Description STG Manage Bowel with Min.Assistance.  Outcome: Progressing Goal: RH STG MANAGE BOWEL W/MEDICATION W/ASSISTANCE Description STG Manage Bowel with Medication with Min.Assistance.  Outcome: Progressing   Problem: RH BLADDER ELIMINATION Goal: RH STG MANAGE BLADDER WITH ASSISTANCE Description STG Manage Bladder With Min. Assistance  Outcome: Progressing   Problem: RH SKIN INTEGRITY Goal: RH STG SKIN FREE OF INFECTION/BREAKDOWN Description With min. Assist.  Outcome: Progressing Goal: RH STG MAINTAIN SKIN INTEGRITY WITH ASSISTANCE Description STG Maintain Skin Integrity With Min. Assistance.  Outcome: Progressing Goal: RH STG ABLE TO PERFORM INCISION/WOUND CARE W/ASSISTANCE Description STG Able To Perform Incision/Wound Care With Min.Assistance.  Outcome: Progressing   Problem: RH SAFETY Goal: RH STG ADHERE TO SAFETY PRECAUTIONS W/ASSISTANCE/DEVICE Description STG Adhere to Safety Precautions With Mod.Assistance/Device.  Outcome: Progressing Goal: RH STG DECREASED RISK OF FALL WITH ASSISTANCE Description STG Decreased Risk of Fall With Mod. Assistance.  Outcome: Progressing   Problem: RH PAIN MANAGEMENT Goal: RH STG PAIN MANAGED AT OR BELOW PT'S PAIN GOAL Description Less than 3,on 1 to 10 scale  Outcome: Progressing

## 2018-05-04 NOTE — Progress Notes (Signed)
Physical Therapy Session Note  Patient Details  Name: Casey Gilmore MRN: [id number] Date of Birth: 1941/01/02  Today's Date: 05/04/2018 PT Individual Time: 1300-1415 PT Individual Time Calculation (min): 75 min   Short Term Goals: Week 1:  PT Short Term Goal 1 (Week 1): Pt will initiate gait training w/ LRAD PT Short Term Goal 1 - Progress (Week 1): Partly met PT Short Term Goal 2 (Week 1): Pt will transfer bed<>chair w/ mod assist PT Short Term Goal 2 - Progress (Week 1): Progressing toward goal PT Short Term Goal 3 (Week 1): Pt will initiate w/c self-propulsion training PT Short Term Goal 3 - Progress (Week 1): Met PT Short Term Goal 4 (Week 1): Pt will perform bed mobility w/ mod assist PT Short Term Goal 4 - Progress (Week 1): Progressing toward goal Week 2:  PT Short Term Goal 1 (Week 2): Pt will perform bed mobility with mod assist consistently  PT Short Term Goal 2 (Week 2): Pt will ambuate 37f with mod-max assist and LRAD  PT Short Term Goal 3 (Week 2): Pt will propell WC 1566fwith supervision assist consistently  PT Short Term Goal 4 (Week 2): PT will perform sit<>stand with mod assist of 1 consistently   Skilled Therapeutic Interventions/Progress Updates: \  Pt received sitting in WC and agreeable to PT  Sit<>stand [facility] x 6 throughout treatment and max assist overall due to L hip pain strond posterior lean. Stand<>pivot [facility] with max assist and BUE support at RW. Max cues for gait pattern   Gait training with RW x 7 ft and mod assist from PT for safety. Max cues for proper use of RW to peform step to gait pattern and maintain TDWB through the LLE.   Toilet transfer with max assist from PT and BUE on RW. PT required to perform all clothing management and pericare. Pt noted to have small incontinent bowel movement prior to toilet transfer.  Max assist +2 for safety [facility].    Pt left sitting in room with call bell in reach and all needs met.   Therapy Documentation Precautions:  Precautions Precautions: Fall Restrictions Weight Bearing Restrictions: Yes LLE Weight Bearing: Touchdown weight bearing Other Position/Activity Restrictions: No ROM restrictions noted in note w/ hip, however pt reports pt has been limited to ~45 deg of L hip flexion since a hip surgery he had in the 1950s Vital Signs: Therapy Vitals Temp: 98.4 F (36.9 C) Temp Source: Oral Pulse Rate: 77 BP: 102/60 Patient Position (if appropriate): Sitting Oxygen Therapy SpO2: 100 % O2 Device: Room Air Pain:   4/10 L hip. Surgical   See Function Navigator for Current Functional Status.   Therapy/Group: Individual Therapy  AuLorie Phenix/30/2019, 5:42 PM

## 2018-05-04 NOTE — Progress Notes (Signed)
Social Work Patient ID: Casey Gilmore, male   DOB: 1941/11/05, 77 y.o.   MRN: [id number]  Have reviewed team conference with pt and son, [name] (via phone).  Both understand that team continues to recommend 24/7 assistance at d/c.  Son reports that family is not able to do this and feels we need to change plan to SNF.  After speaking with son, I met with pt to discuss SNF option and he reports that his other son's wife "said something about maybe staying with me for a few weeks."  Called [name] back and asked that he speak with his brother and confirm whether or not this is an option and, if not, then the family needs to make sure that pt understands so we can proceed with SNF. Will follow up tomorrow with pt and family.  Calia Napp, LCSW

## 2018-05-04 NOTE — Progress Notes (Signed)
Occupational Therapy Session Note  Patient Details  Name: Casey Gilmore MRN: [id number] Date of Birth: [birth year]  Today's Date: 05/04/2018 OT Individual Time: 5366-4403 and 1430-1530 OT Individual Time Calculation (min): 60 min and 60 min   Short Term Goals: Week 2:  OT Short Term Goal 1 (Week 2): Pt will complete sit > stand with mod assist of 1 caregiver in preparation to complete LB dressing at sit > stand level OT Short Term Goal 2 (Week 2): Pt will complete toilet transfer with mod assist of 1 caregiver OT Short Term Goal 3 (Week 2): Pt will complete LB dressing with max assist and use of AE as needed  Skilled Therapeutic Interventions/Progress Updates:    1) Treatment session with focus on ADL retraining with transfers, sit > stand, and LB bathing/dressing.  Pt received supine in bed, completed bed mobility with min-mod assist with increased time and assistance from therapist to advance LLE to EOB.  Pt completed sit > stand with +2 from EOB, mod assist from therapist and 2nd person stabilizing RW.  Pt completed stand pivot transfer to w/c to Lt with mod assist of 1 with 2nd person present for safety.  Engaged in bathing and dressing at sit > stand level with use of long handled sponge for LB bathing.  Pt continues to require increased assistance for LB dressing due to pain and fearfulness with movement of LLE.  +2 sit > stand with therapist providing mod-max assist and 2nd person stabilizing RW while therapist washed buttocks and pulled pants over hips.  Pt demonstrating increased anterior weight shift with sit > stand and standing tolerance, however still unable to release UE from RW long enough to adjust clothing.  Pt tolerating increased movement of LLE during transitional movements and dressing tasks.  Pt left seated upright in w/c with all needs in reach.  2) Treatment session with focus on LB dressing.  Pt received upright in w/c reporting feeling "light headed", asking about blood  sugar.  RN assessed 169.  Noted BP to be on the low side (102/60) and RN reports recently giving pain meds which could all be attributing to pt feeling "off".  Educated on use of reacher for LB dressing with pt able to thread theraband over LLE x3 with increased time due to pain and guarding due to fear of pain during movement of LLE.  Pt able to thread theraband over BLE with reacher to simulate LB dressing.  Pt declined standing at this time due to fatigue.  Completed stand pivot transfer w/c > bed with RW with +2 for safety with 2nd person stabilizing RW while therapist provided max assist this session due to fatigue and decreased anterior weight shift.  Max cues for sequencing pivot to bed and to assist lowering to EOB.  Therapist assisted with lifting BLE into bed and left pt semi-reclined with all needs in reach.  Therapy Documentation Precautions:  Precautions Precautions: Fall Restrictions Weight Bearing Restrictions: Yes LLE Weight Bearing: Touchdown weight bearing Other Position/Activity Restrictions: No ROM restrictions noted in note w/ hip, however pt reports pt has been limited to ~45 deg of L hip flexion since a hip surgery he had in the 1950s Pain: Pain Assessment Pain Scale: 0-10 Pain Score: 4  Pain Type: Acute pain Pain Location: Hip Pain Orientation: Left Pain Descriptors / Indicators: Aching;Throbbing Pain Onset: On-going Patients Stated Pain Goal: 2 Pain Intervention(s): Repositioned  See Function Navigator for Current Functional Status.   Therapy/Group: Individual Therapy  Jamel Dunton,  [facility] 05/04/2018, 12:07 PM

## 2018-05-04 NOTE — Progress Notes (Signed)
[place] PHYSICAL MEDICINE & REHABILITATION     PROGRESS NOTE  Subjective/Complaints:  Patient seen lying in bed this morning. He states he slept well overnight.  ROS: denies CP, SOB, nausea, vomiting, diarrhea.  Objective: Vital Signs: Blood pressure 125/70, pulse 93, temperature 98.7 F (37.1 C), temperature source Oral, resp. rate 14, weight 78.7 kg (173 lb 8 oz), SpO2 97 %. No results found. Recent Labs    05/02/18 0645  WBC 5.9  HGB 8.9*  HCT 30.9*  PLT 344   Recent Labs    05/02/18 0645  NA 140  K 4.3  CL 105  GLUCOSE 133*  BUN 24*  CREATININE 1.02  CALCIUM 8.8*   CBG (last 3)  Recent Labs    05/03/18 1614 05/03/18 2115 05/04/18 0639  GLUCAP 158* 85 137*    Wt Readings from Last 3 Encounters:  04/24/18 78.7 kg (173 lb 8 oz)  04/22/18 81.3 kg (179 lb 3.7 oz)  01/19/18 88.5 kg (195 lb)    Physical Exam:  BP 125/70 (BP Location: Left Arm)   Pulse 93   Temp 98.7 F (37.1 C) (Oral)   Resp 14   Wt 78.7 kg (173 lb 8 oz)   SpO2 97%   BMI 23.53 kg/m  Constitutional: He appears well-developed and well-nourished. NAD. HENT: Normocephalic and atraumatic.  Eyes: EOM are normal. No discharge.  Cardiovascular: RRR. No JVD.  Respiratory: Effort normal and breath sounds normal.  GI: Bowel sounds are normal. He exhibits no distension.  Musculoskeletal: Left hip edema and tenderness, improving  Neurological: He is alert and oriented.  Motor:  Right lower extremity: Hip flexion, knee extension 4/5, ankle dorsiflexion 4+/5 Left lower extremity: Hip flexion, knee extension 1+/5, ankle dorsiflexion 4+/5 (stable) Skin: Surgical site c/d/i Psychiatric: He has a normal mood and affect. His behavior is normal.   Assessment/Plan: 1. Functional deficits secondary to left hip fracture which require 3+ hours per day of interdisciplinary therapy in a comprehensive inpatient rehab setting. Physiatrist is providing close team supervision and 24 hour management of active  medical problems listed below. Physiatrist and rehab team continue to assess barriers to discharge/monitor patient progress toward functional and medical goals.  Function:  Bathing Bathing position   Position: Wheelchair/chair at sink  Bathing parts Body parts bathed by patient: Right arm, Left arm, Chest, Abdomen, Front perineal area, Right upper leg, Left upper leg Body parts bathed by helper: Buttocks, Back  Bathing assist Assist Level: 2 helpers      Upper Body Dressing/Undressing Upper body dressing   What is the patient wearing?: Button up shirt         Button up shirt - Perfomed by patient: Thread/unthread right sleeve Button up shirt - Perfomed by helper: Thread/unthread left sleeve, Pull shirt around back, Button/unbutton shirt    Upper body assist Assist Level: (Max assist)      Lower Body Dressing/Undressing Lower body dressing   What is the patient wearing?: Pants, Shoes   Underwear - Performed by helper: Thread/unthread right underwear leg, Thread/unthread left underwear leg, Pull underwear up/down   Pants- Performed by helper: Thread/unthread right pants leg, Thread/unthread left pants leg, Pull pants up/down   Non-skid slipper socks- Performed by helper: Don/doff left sock       Shoes - Performed by helper: Don/doff right shoe, Fasten right          Lower body assist Assist for lower body dressing: 2 Medical sales representative  Toileting steps completed by patient: Adjust clothing prior to toileting, Performs perineal hygiene Toileting steps completed by helper: Adjust clothing prior to toileting, Performs perineal hygiene, Adjust clothing after toileting    Toileting assist Assist level: (total assist)   Transfers Chair/bed transfer   Chair/bed transfer method: Ambulatory Chair/bed transfer assist level: 2 helpers Chair/bed transfer assistive device: Financial risk analyst activity did not occur: Safety/medical  concerns   Max distance: 4 Assist level: 2 helpers   Oceanographer activity did not occur: Safety/medical concerns Type: Manual Max wheelchair distance: 50 Assist Level: Touching or steadying assistance (Pt > 75%)  Cognition Comprehension Comprehension assist level: Follows basic conversation/direction with no assist  Expression Expression assist level: Expresses complex ideas: With no assist  Social Interaction Social Interaction assist level: Interacts appropriately 75 - 89% of the time - Needs redirection for appropriate language or to initiate interaction.  Problem Solving Problem solving assist level: Solves basic 90% of the time/requires cueing < 10% of the time  Memory Memory assist level: Recognizes or recalls 75 - 89% of the time/requires cueing 10 - 24% of the time    Medical Problem List and Plan: 1.  Decreased functional mobility secondary to left subtrochanteric femur fracture, retained hip fusion hardware placed in the 1950s and broken hardware status post ORIF 04/18/2018.  Touchdown weightbearing x8 weeks   Continue CIR  2.  DVT Prophylaxis/Anticoagulation: Subcutaneous Lovenox.     Vascular study negative 3. Pain Management: Oxycodone as needed, frequency decreased on 5/23  Robaxin when necessary started on 5/23   Unclear picture regarding home narcotic regimen   Relatively controlled on 5/30 4. Mood: Provide emotional support 5. Neuropsych: This patient is capable of making decisions on his own behalf. 6. Skin/Wound Care: Routine skin checks  DCed staples on 5/27 7. Fluids/Electrolytes/Nutrition: Routine in and outs    BMP within acceptable range on 5/21, creatinine within normal limits on 5/28 8.  Acute on chronic anemia.  Continue iron supplement   Hemoglobin 8.9 on 5/28   Continue to monitor 9.  Recent fall with left humeral and radial/ulnar fractures. 10.  Type 2 diabetes mellitus.  Hemoglobin A1c 7.6.  SSI.  Check blood sugars before meals and at  bedtime (on metformin 1000 twice a day, Amaryl 4 mg daily PTA).   Metformin 500 twice a day started on 5/22, increased to 1000mg  on 5/24   Amaryl 1 mg started on 5/27  CBG (last 3)  Recent Labs    05/03/18 1614 05/03/18 2115 05/04/18 0639  GLUCAP 158* 85 137*   Labile on 5/30 11.  Hyperlipidemia.  Pravachol 12.  History of severe aortic stenosis.  Status post bioprosthetic AVR 2012. 13.  History of GI bleed.  Continue Protonix   See #8 14. Hypoalbuminemia   Supplement initiated on 5/21 15. Constipation  Bowel regiment increased on 5/23   LOS (Days) 10 A FACE TO FACE EVALUATION WAS PERFORMED  Ankit [name] 05/04/2018 8:27 AM

## 2018-05-05 ENCOUNTER — Inpatient Hospital Stay (HOSPITAL_COMMUNITY): Payer: Medicare Other | Admitting: Occupational Therapy

## 2018-05-05 ENCOUNTER — Inpatient Hospital Stay (HOSPITAL_COMMUNITY): Payer: Medicare Other | Admitting: Physical Therapy

## 2018-05-05 LAB — GLUCOSE, CAPILLARY
GLUCOSE-CAPILLARY: 106 mg/dL — AB (ref 65–99)
GLUCOSE-CAPILLARY: 123 mg/dL — AB (ref 65–99)
Glucose-Capillary: 122 mg/dL — ABNORMAL HIGH (ref 65–99)
Glucose-Capillary: 165 mg/dL — ABNORMAL HIGH (ref 65–99)
Glucose-Capillary: 150 mg/dL — ABNORMAL HIGH (ref 65–99)

## 2018-05-05 LAB — BASIC METABOLIC PANEL
ANION GAP: 15 (ref 5–15)
BUN: 30 mg/dL — ABNORMAL HIGH (ref 6–20)
CALCIUM: 9.4 mg/dL (ref 8.9–10.3)
CHLORIDE: 99 mmol/L — AB (ref 101–111)
CO2: 25 mmol/L (ref 22–32)
Creatinine, Ser: 1.27 mg/dL — ABNORMAL HIGH (ref 0.61–1.24)
GFR calc Af Amer: >60 mL/min (ref >60)
GFR calc non Af Amer: 53 mL/min — ABNORMAL LOW (ref >60)
Glucose, Bld: 152 mg/dL — ABNORMAL HIGH (ref 65–99)
Potassium: 4 mmol/L (ref 3.5–5.1)
Sodium: 139 mmol/L (ref 135–145)

## 2018-05-05 MED ORDER — SODIUM CHLORIDE 0.45 % IV SOLN
INTRAVENOUS | Status: DC
  Administered 2018-05-05: 19:00:00 via INTRAVENOUS

## 2018-05-05 NOTE — Progress Notes (Signed)
Occupational Therapy Session Note  Patient Details  Name: Casey Gilmore MRN: [id number] Date of Birth: 1941/07/28  Today's Date: 05/05/2018 OT Individual Time: 1103-1200 and 1445-1530 OT Individual Time Calculation (min): 57 min and 45 min   Short Term Goals: Week 2:  OT Short Term Goal 1 (Week 2): Pt will complete sit > stand with mod assist of 1 caregiver in preparation to complete LB dressing at sit > stand level OT Short Term Goal 2 (Week 2): Pt will complete toilet transfer with mod assist of 1 caregiver OT Short Term Goal 3 (Week 2): Pt will complete LB dressing with max assist and use of AE as needed  Skilled Therapeutic Interventions/Progress Updates:    1) Treatment session with focus on sitting balance and achieving upright sitting posture.  Pt received upright in reclining w/c with RN having just assessed vitals and provided with pain meds.  Pt reports feeling "swimmy headed" this AM but feeling better after ginger ale.  Engaged in dynamic sitting activity with focus on anterior weight shift and reaching to facilitate weight shift as needed for sit > stand and LB dressing.  Engaged in reaching to complete peg activity initially with LUE progressing to RUE.  Pt with increase difficulty reaching with RUE even when reaching towards items on Rt, requiring min-mod assist to achieve more upright sitting posture.  Pt demonstrating increased tolerance to mobility with sitting activity and even lifting LLE off leg rest with increased time.  Pt returned to room and left sitting up in w/c with lunch tray arriving.  2) Treatment session with focus on sit > stand and stand pivot transfers.  Pt received upright in w/c with RN providing nursing care.  Pt reports still feeling "swimmy headed" sometimes and RN reports plan to give IV fluids overnight.  Engaged in sit > stand outside parallel bars with focus on weight shifting and decreased burden of care with sit > stand.  Pt able to complete sit >  stand with LUE on bar and pushing up from w/c with RUE while therapist provided lifting assistance under Rt arm.  Pt demonstrating improved ability to adjust LLE once upright in standing.  Pt tolerated sit > stand x2 before requesting to return to bed.  Completed sit > stand with 2nd person stabilizing RW and mod assist from therapist to lift into standing.  Once upright pt completed stand pivot transfer with RW with min assist and cues for sequencing with +2 for safety.  +2 to position in bed due to Lt hip pain.  Therapy Documentation Precautions:  Precautions Precautions: Fall Restrictions Weight Bearing Restrictions: Yes LLE Weight Bearing: Touchdown weight bearing Other Position/Activity Restrictions: No ROM restrictions noted in note w/ hip, however pt reports pt has been limited to ~45 deg of L hip flexion since a hip surgery he had in the 1950s General:   Vital Signs: Therapy Vitals BP: 96/62(feels "swimmy headed") Patient Position (if appropriate): Sitting Pain: Pain Assessment Pain Scale: 0-10 Pain Score: 4  Pain Type: Acute pain Pain Location: Hip Pain Orientation: Left Pain Descriptors / Indicators: Aching Pain Frequency: Constant Pain Onset: On-going Patients Stated Pain Goal: 3 Pain Intervention(s): Medication (See eMAR)  See Function Navigator for Current Functional Status.   Therapy/Group: Individual Therapy  [name] 05/05/2018, 12:12 PM

## 2018-05-05 NOTE — Progress Notes (Signed)
RN called to room for reports that patient is "swimmy headed."  Patient requested we check blood sugar-160s.  Last time patient had an episode similar to this, his blood pressure was borderline low.  Checked manual BP on right arm while sitting - 92/62.  Encouraged patient to continue to drink plenty of fluids.  Informed Marlowe Shores, PA of patients symptoms.  Ordered to check stat BMET and call him with results.  Placed TED hose on patient and informed him of the upcoming blood work.  Will continue to monitor.  [name], RN

## 2018-05-05 NOTE — Progress Notes (Signed)
Physical Therapy Session Note  Patient Details  Name: Casey Gilmore MRN: [id number] Date of Birth: 1941-01-10  Today's Date: 05/05/2018 PT Individual Time: 1330-1430 PT Individual Time Calculation (min): 60 min   Short Term Goals: Week 2:  PT Short Term Goal 1 (Week 2): Pt will perform bed mobility with mod assist consistently  PT Short Term Goal 2 (Week 2): Pt will ambuate 33f with mod-max assist and LRAD  PT Short Term Goal 3 (Week 2): Pt will propell WC 1546fwith supervision assist consistently  PT Short Term Goal 4 (Week 2): PT will perform sit<>stand with mod assist of 1 consistently  Skilled Therapeutic Interventions/Progress Updates:   Pt received sitting in WC and agreeable to PT  PT instructed pt in WC mobility x 10045fith supervision assist and min cues for improved use of LUE to maintain straight path through hall and improve turning technique. Sit<>stand [facility] with max assist x 3 and mod assist x 1 moderate cues for set up and improved use of RUE prevent posterior LOB.   Gait training instructed by PT x [id number] RW and mod assist. Moderate multimodal cues for improved use of BUE and decreased use of the LLE to advance the RLE.   BLE therex instructed by PT SAQ x 10  Ankle DF x 25 Calf raises x 20  RLE hip flexion x 10 RLE hip abduction x 10  Cues from PT for improved ROM and proper speed of eccentric movement to improve strengthening aspects of movements.     Patient returned to room and left [facility] in reach and all needs met.     Therapy Documentation Precautions:  Precautions Precautions: Fall Restrictions Weight Bearing Restrictions: Yes LLE Weight Bearing: Touchdown weight bearing Other Position/Activity Restrictions: No ROM restrictions noted in note w/ hip, however pt reports pt has been limited to ~45 deg of L hip flexion since a hip surgery he had in the 1950s Vital Signs: Therapy  Vitals Temp: 97.9 F (36.6 C) Temp Source: Oral Pulse Rate: 86 Resp: 17 BP: 127/72 Patient Position (if appropriate): Sitting Oxygen Therapy SpO2: 100 % O2 Device: Room Air Pain: 4/10 L hip. Surgical   See Function Navigator for Current Functional Status.   Therapy/Group: Individual Therapy  AustLorie Phenix1/2019, 6:32 PM

## 2018-05-05 NOTE — Plan of Care (Signed)
  Problem: Consults Goal: RH GENERAL PATIENT EDUCATION Description See Patient Education module for education specifics. Outcome: Progressing   Problem: RH BOWEL ELIMINATION Goal: RH STG MANAGE BOWEL WITH ASSISTANCE Description STG Manage Bowel with Min.Assistance.  Outcome: Progressing Goal: RH STG MANAGE BOWEL W/MEDICATION W/ASSISTANCE Description STG Manage Bowel with Medication with Min.Assistance.  Outcome: Progressing   Problem: RH BLADDER ELIMINATION Goal: RH STG MANAGE BLADDER WITH ASSISTANCE Description STG Manage Bladder With Min. Assistance  Outcome: Progressing   Problem: RH SKIN INTEGRITY Goal: RH STG SKIN FREE OF INFECTION/BREAKDOWN Description With min. Assist.  Outcome: Progressing Goal: RH STG MAINTAIN SKIN INTEGRITY WITH ASSISTANCE Description STG Maintain Skin Integrity With Min. Assistance.  Outcome: Progressing Goal: RH STG ABLE TO PERFORM INCISION/WOUND CARE W/ASSISTANCE Description STG Able To Perform Incision/Wound Care With Min.Assistance.  Outcome: Progressing   Problem: RH SAFETY Goal: RH STG ADHERE TO SAFETY PRECAUTIONS W/ASSISTANCE/DEVICE Description STG Adhere to Safety Precautions With Mod.Assistance/Device.  Outcome: Progressing Goal: RH STG DECREASED RISK OF FALL WITH ASSISTANCE Description STG Decreased Risk of Fall With Mod. Assistance.  Outcome: Progressing   Problem: RH PAIN MANAGEMENT Goal: RH STG PAIN MANAGED AT OR BELOW PT'S PAIN GOAL Description Less than 3,on 1 to 10 scale  Outcome: Progressing

## 2018-05-05 NOTE — Progress Notes (Signed)
Occupational Therapy Session Note  Patient Details  Name: PARKER WHERLEY MRN: [id number] Date of Birth: 08/14/1941  Today's Date: 05/05/2018 OT Individual Time: 0930-1000 OT Individual Time Calculation (min): 30 min    Short Term Goals: Week 1:  OT Short Term Goal 1 (Week 1): Pt will complete sit > stand with mod assist of 1 caregiver in preparation to complete LB dressing at sit > stand level OT Short Term Goal 1 - Progress (Week 1): Progressing toward goal OT Short Term Goal 2 (Week 1): Pt will complete bathing with mod assist OT Short Term Goal 2 - Progress (Week 1): Met OT Short Term Goal 3 (Week 1): Pt will complete toilet transfer with mod assist of 1 caregiver OT Short Term Goal 3 - Progress (Week 1): Progressing toward goal OT Short Term Goal 4 (Week 1): Pt will complete LB dressing with max assist and use of AE as needed OT Short Term Goal 4 - Progress (Week 1): Progressing toward goal Week 2:  OT Short Term Goal 1 (Week 2): Pt will complete sit > stand with mod assist of 1 caregiver in preparation to complete LB dressing at sit > stand level OT Short Term Goal 2 (Week 2): Pt will complete toilet transfer with mod assist of 1 caregiver OT Short Term Goal 3 (Week 2): Pt will complete LB dressing with max assist and use of AE as needed  Skilled Therapeutic Interventions/Progress Updates:    Pt seen this session to facilitate bed mobility and sit >< stand skills to complete LB dressing.  Pt needed mod A to come to EOB and then max A of 1 to stand but then max A of 2 to sit slowly to both the bed and then later the w/c as pt leans back in the descent to avoid flexing his hip.  Once standing with the walker (3x total) this session, pt was able to hold his static stand balance with close S to CGA.  He did need full A to don pants and shoes.  Pt then completed stand pivot to W/C with min A to support balance.   Pt resting in wc with QRB and chair alarm on.  Therapy  Documentation Precautions:  Precautions Precautions: Fall Restrictions Weight Bearing Restrictions: Yes LLE Weight Bearing: Touchdown weight bearing Other Position/Activity Restrictions: No ROM restrictions noted in note w/ hip, however pt reports pt has been limited to ~45 deg of L hip flexion since a hip surgery he had in the 1950s    Vital Signs: Therapy Vitals Temp: 98.8 F (37.1 C) Temp Source: Oral Pulse Rate: 80 Resp: 18 BP: 122/68 Patient Position (if appropriate): Lying Oxygen Therapy SpO2: 96 % O2 Device: Room Air Pain: Pain Assessment Pain Scale: 0-10 Pain Score: 6  Pain Intervention(s): Medication (See eMAR) ADL:    See Function Navigator for Current Functional Status.   Therapy/Group: Individual Therapy  [place] 05/05/2018, 8:40 AM

## 2018-05-05 NOTE — Progress Notes (Signed)
Patient a bit orthostatic today while with therapies.  Appears clinically dry.  Follow-up chemistries with elevating BUN of 30 creatinine 1.27.  We will add gentle IV fluids and follow-up chemistries in the a.m.

## 2018-05-05 NOTE — Progress Notes (Signed)
[place] PHYSICAL MEDICINE & REHABILITATION     PROGRESS NOTE  Subjective/Complaints:  Patient seen lying in bed this morning. He states he slept well overnight.  ROS: denies CP, SOB, nausea, vomiting, diarrhea.  Objective: Vital Signs: Blood pressure 122/68, pulse 80, temperature 98.8 F (37.1 C), temperature source Oral, resp. rate 18, weight 78.7 kg (173 lb 8 oz), SpO2 96 %. No results found. No results for input(s): WBC, HGB, HCT, PLT in the last 72 hours. No results for input(s): NA, K, CL, GLUCOSE, BUN, CREATININE, CALCIUM in the last 72 hours.  Invalid input(s): CO CBG (last 3)  Recent Labs    05/04/18 1636 05/04/18 2045 05/05/18 0640  GLUCAP 117* 140* 122*    Wt Readings from Last 3 Encounters:  04/24/18 78.7 kg (173 lb 8 oz)  04/22/18 81.3 kg (179 lb 3.7 oz)  01/19/18 88.5 kg (195 lb)    Physical Exam:  BP 122/68 (BP Location: Left Arm)   Pulse 80   Temp 98.8 F (37.1 C) (Oral)   Resp 18   Wt 78.7 kg (173 lb 8 oz)   SpO2 96%   BMI 23.53 kg/m  Constitutional: He appears well-developed and well-nourished. NAD. HENT: Normocephalic and atraumatic.  Eyes: EOM are normal. No discharge.  Cardiovascular: RRR. No JVD.  Respiratory: Effort normal and breath sounds normal.  GI: Bowel sounds are normal. He exhibits no distension.  Musculoskeletal: Left hip edema and tenderness, improving  Neurological: He is alert and oriented.  Motor:  Right lower extremity: Hip flexion, knee extension 4+/5, ankle dorsiflexion 4+/5 Left lower extremity: Hip flexion, knee extension 1+/5, ankle dorsiflexion 4+/5 (unchanged) Skin: Surgical site c/d/i Psychiatric: He has a normal mood and affect. His behavior is normal.   Assessment/Plan: 1. Functional deficits secondary to left hip fracture which require 3+ hours per day of interdisciplinary therapy in a comprehensive inpatient rehab setting. Physiatrist is providing close team supervision and 24 hour management of active  medical problems listed below. Physiatrist and rehab team continue to assess barriers to discharge/monitor patient progress toward functional and medical goals.  Function:  Bathing Bathing position   Position: Wheelchair/chair at sink  Bathing parts Body parts bathed by patient: Right arm, Left arm, Chest, Abdomen, Front perineal area, Right upper leg, Left upper leg Body parts bathed by helper: Buttocks, Back  Bathing assist Assist Level: 2 helpers      Upper Body Dressing/Undressing Upper body dressing   What is the patient wearing?: Button up shirt         Button up shirt - Perfomed by patient: Thread/unthread right sleeve, Thread/unthread left sleeve Button up shirt - Perfomed by helper: Pull shirt around back, Button/unbutton shirt    Upper body assist Assist Level: (Mod assist)      Lower Body Dressing/Undressing Lower body dressing   What is the patient wearing?: Pants, Non-skid slipper socks, Shoes   Underwear - Performed by helper: Thread/unthread right underwear leg, Thread/unthread left underwear leg, Pull underwear up/down   Pants- Performed by helper: Thread/unthread right pants leg, Thread/unthread left pants leg, Pull pants up/down   Non-skid slipper socks- Performed by helper: Don/doff right sock, Don/doff left sock       Shoes - Performed by helper: Don/doff right shoe, Fasten right          Lower body assist Assist for lower body dressing: 2 Helpers      Toileting Toileting   Toileting steps completed by patient: Adjust clothing prior to toileting, Adjust clothing  after toileting Toileting steps completed by helper: Adjust clothing prior to toileting, Performs perineal hygiene, Adjust clothing after toileting    Toileting assist Assist level: Set up/obtain supplies   Transfers Chair/bed transfer   Chair/bed transfer method: Stand pivot Chair/bed transfer assist level: Maximal assist (Pt 25 - 49%/lift and lower) Chair/bed transfer assistive  device: Medical sales representative Ambulation activity did not occur: Safety/medical concerns   Max distance: 7 Assist level: Moderate assist (Pt 50 - 74%)   Wheelchair Wheelchair activity did not occur: Safety/medical concerns Type: Manual Max wheelchair distance: 50 Assist Level: Touching or steadying assistance (Pt > 75%)  Cognition Comprehension Comprehension assist level: Follows basic conversation/direction with no assist  Expression Expression assist level: Expresses complex ideas: With no assist  Social Interaction Social Interaction assist level: Interacts appropriately 75 - 89% of the time - Needs redirection for appropriate language or to initiate interaction.  Problem Solving Problem solving assist level: Solves basic 90% of the time/requires cueing < 10% of the time  Memory Memory assist level: Recognizes or recalls 75 - 89% of the time/requires cueing 10 - 24% of the time    Medical Problem List and Plan: 1.  Decreased functional mobility secondary to left subtrochanteric femur fracture, retained hip fusion hardware placed in the 1950s and broken hardware status post ORIF 04/18/2018.  Touchdown weightbearing x8 weeks   Continue CIR  2.  DVT Prophylaxis/Anticoagulation: Subcutaneous Lovenox.     Vascular study negative 3. Pain Management: Oxycodone as needed, frequency decreased on 5/23  Robaxin when necessary started on 5/23   Unclear picture regarding home narcotic regimen   Relatively controlled on 5/31 4. Mood: Provide emotional support 5. Neuropsych: This patient is capable of making decisions on his own behalf. 6. Skin/Wound Care: Routine skin checks  DCed staples on 5/27 7. Fluids/Electrolytes/Nutrition: Routine in and outs    BMP within acceptable range on 5/21, creatinine within normal limits on 5/28 8.  Acute on chronic anemia.  Continue iron supplement   Hemoglobin 8.9 on 5/28   Continue to monitor 9.  Recent fall with left humeral and radial/ulnar  fractures. 10.  Type 2 diabetes mellitus.  Hemoglobin A1c 7.6.  SSI.  Check blood sugars before meals and at bedtime (on metformin 1000 twice a day, Amaryl 4 mg daily PTA).   Metformin 500 twice a day started on 5/22, increased to 1000mg  on 5/24   Amaryl 1 mg started on 5/27  CBG (last 3)  Recent Labs    05/04/18 1636 05/04/18 2045 05/05/18 0640  GLUCAP 117* 140* 122*   Labile on 5/31 11.  Hyperlipidemia.  Pravachol 12.  History of severe aortic stenosis.  Status post bioprosthetic AVR 2012. 13.  History of GI bleed.  Continue Protonix   See #8 14. Hypoalbuminemia   Supplement initiated on 5/21 15. Constipation  Bowel regiment increased on 5/23   LOS (Days) 11 A FACE TO FACE EVALUATION WAS PERFORMED  Ankit [name] 05/05/2018 8:39 AM

## 2018-05-06 ENCOUNTER — Inpatient Hospital Stay (HOSPITAL_COMMUNITY): Payer: Medicare Other | Admitting: Physical Therapy

## 2018-05-06 ENCOUNTER — Inpatient Hospital Stay (HOSPITAL_COMMUNITY): Payer: Medicare Other | Admitting: Occupational Therapy

## 2018-05-06 DIAGNOSIS — I951 Orthostatic hypotension: Secondary | ICD-10-CM

## 2018-05-06 LAB — BASIC METABOLIC PANEL
ANION GAP: 11 (ref 5–15)
BUN: 29 mg/dL — ABNORMAL HIGH (ref 6–20)
CHLORIDE: 101 mmol/L (ref 101–111)
CO2: 26 mmol/L (ref 22–32)
Calcium: 8.5 mg/dL — ABNORMAL LOW (ref 8.9–10.3)
Creatinine, Ser: 1.15 mg/dL (ref 0.61–1.24)
GFR calc non Af Amer: 60 mL/min — ABNORMAL LOW (ref >60)
Glucose, Bld: 241 mg/dL — ABNORMAL HIGH (ref 65–99)
POTASSIUM: 3.8 mmol/L (ref 3.5–5.1)
Sodium: 138 mmol/L (ref 135–145)

## 2018-05-06 LAB — GLUCOSE, CAPILLARY
GLUCOSE-CAPILLARY: 120 mg/dL — AB (ref 65–99)
GLUCOSE-CAPILLARY: 197 mg/dL — AB (ref 65–99)
Glucose-Capillary: 115 mg/dL — ABNORMAL HIGH (ref 65–99)
Glucose-Capillary: 117 mg/dL — ABNORMAL HIGH (ref 65–99)

## 2018-05-06 NOTE — Progress Notes (Signed)
Per Dr. Posey Pronto, current IV fluid order does not need to be restarted.

## 2018-05-06 NOTE — Plan of Care (Signed)
  Problem: Consults Goal: RH GENERAL PATIENT EDUCATION Description See Patient Education module for education specifics. Outcome: Progressing   Problem: RH BOWEL ELIMINATION Goal: RH STG MANAGE BOWEL WITH ASSISTANCE Description STG Manage Bowel with Min.Assistance.  Outcome: Progressing Goal: RH STG MANAGE BOWEL W/MEDICATION W/ASSISTANCE Description STG Manage Bowel with Medication with Min.Assistance.  Outcome: Progressing   Problem: RH BLADDER ELIMINATION Goal: RH STG MANAGE BLADDER WITH ASSISTANCE Description STG Manage Bladder With Min. Assistance  Outcome: Progressing   Problem: RH SKIN INTEGRITY Goal: RH STG SKIN FREE OF INFECTION/BREAKDOWN Description With min. Assist.  Outcome: Progressing Goal: RH STG MAINTAIN SKIN INTEGRITY WITH ASSISTANCE Description STG Maintain Skin Integrity With Min. Assistance.  Outcome: Progressing Goal: RH STG ABLE TO PERFORM INCISION/WOUND CARE W/ASSISTANCE Description STG Able To Perform Incision/Wound Care With Min.Assistance.  Outcome: Progressing   Problem: RH SAFETY Goal: RH STG ADHERE TO SAFETY PRECAUTIONS W/ASSISTANCE/DEVICE Description STG Adhere to Safety Precautions With Mod.Assistance/Device.  Outcome: Progressing Goal: RH STG DECREASED RISK OF FALL WITH ASSISTANCE Description STG Decreased Risk of Fall With Mod. Assistance.  Outcome: Progressing   Problem: RH PAIN MANAGEMENT Goal: RH STG PAIN MANAGED AT OR BELOW PT'S PAIN GOAL Description Less than 3,on 1 to 10 scale  Outcome: Progressing

## 2018-05-06 NOTE — Progress Notes (Signed)
Occupational Therapy Session Note  Patient Details  Name: Casey Gilmore MRN: [id number] Date of Birth: [birth year]  Today's Date: 05/06/2018 OT Individual Time: 0930-1000 OT Individual Time Calculation (min): 30 min    Short Term Goals: Week 2:  OT Short Term Goal 1 (Week 2): Pt will complete sit > stand with mod assist of 1 caregiver in preparation to complete LB dressing at sit > stand level OT Short Term Goal 2 (Week 2): Pt will complete toilet transfer with mod assist of 1 caregiver OT Short Term Goal 3 (Week 2): Pt will complete LB dressing with max assist and use of AE as needed  Skilled Therapeutic Interventions/Progress Updates:    Treatment session focused on ADLs/self care, pain management, transfer training, and pt education. Upon entering pt supine in bed resting and agreeable to OT. Pt performed bed mob for side to side rolling and EOB sitting. Required continual to L LE during all mobility and reported to "not bend" on that side of body. During EOB sitting pt leaning to R side and resting back on R UE. Pt also required L arm on bed rail to hold himself up intermittently during UB ADLs. Therapist provided instruction on upright sitting and trunk control for ADL completion. Pt completed UB washing with min A for sitting support. Pt required min A for putting on button up shirt and buttoning up shirt. Pt transitioned from EOB to supine lying with max A and v/c for hand/foot placement using bedrails to assist with "scooting" and bridging in bed. Pt repositioned in bed with max A with all needs met.   Therapy Documentation Precautions:  Precautions Precautions: Fall Restrictions Weight Bearing Restrictions: Yes LLE Weight Bearing: Touchdown weight bearing Other Position/Activity Restrictions: No ROM restrictions noted in note w/ hip, however pt reports pt has been limited to ~45 deg of L hip flexion since a hip surgery he had in the 1950s Pain: Pain Assessment Pain Scale:  0-10 Pain Score: 5  Pain Location: Hip Pain Orientation: Left Pain Descriptors / Indicators: Aching Pain Onset: With Activity Patients Stated Pain Goal: 0 Pain Intervention(s): Repositioned  See Function Navigator for Current Functional Status.   Therapy/Group: Individual Therapy  Delon [place] 05/06/2018, 12:10 PM

## 2018-05-06 NOTE — Progress Notes (Signed)
Physical Therapy Session Note  Patient Details  Name: Casey Gilmore MRN: [id number] Date of Birth: August 22, 1941  Today's Date: 05/06/2018 PT Individual Time: 1445-1555 PT Individual Time Calculation (min): 70 min   Short Term Goals: Week 2:  PT Short Term Goal 1 (Week 2): Pt will perform bed mobility with mod assist consistently  PT Short Term Goal 2 (Week 2): Pt will ambuate 71f with mod-max assist and LRAD  PT Short Term Goal 3 (Week 2): Pt will propell WC 1574fwith supervision assist consistently  PT Short Term Goal 4 (Week 2): PT will perform sit<>stand with mod assist of 1 consistently   Skilled Therapeutic Interventions/Progress Update: Pt received supine in bed and agreeable to PT. Supine>sit transfer with mod assist and min cues for use of bed features. Sit<>stand from EOB with max assist from PT and moderate cues for set up and AD management.   PT instructed pt in WC mobility x 1405fith supervision assist fromPT and min cues for doorway management and improved use of the LUE to maintain straight path.   Sit<>stand x 5 in rehab gym from various surfaces inclduing WC and arm chair with max assist on this day. Moderate cues from PT for improved weight shift over the RLE and proper UE placement to increase independence with transfer.   Gait training x 13 ft with RW and mod assist from PT with moderate cues for proper use of UE and decreased WB through the LLE.   Standing and sitting therex. Standing with BUE support on RW: LLE hip flexion within available range x 6. Hip abduction within available range, LLE knee Flexion x 8. RLE minisquat  X 8. Seated: LAQ within available range BLE x 12, calf raises x 15.   Patient returned to room and [facility] and all needs met.         Therapy Documentation Precautions:  Precautions Precautions: Fall Restrictions Weight Bearing Restrictions: Yes LLE Weight Bearing: Touchdown weight bearing Other  Position/Activity Restrictions: No ROM restrictions noted in note w/ hip, however pt reports pt has been limited to ~45 deg of L hip flexion since a hip surgery he had in the 1950s General:   Vital Signs: Therapy Vitals Temp: 98 F (36.7 C) Temp Source: Oral Pulse Rate: 80 Resp: (!) 21 BP: 107/69 Patient Position (if appropriate): Sitting Oxygen Therapy SpO2: 98 % O2 Device: Room Air Pain: Pain Assessment Pain Scale: 0-10 Pain Score: 5 Pain Type: Acute pain Pain Location: Hip Pain Orientation: Left Pain Descriptors / Indicators: Aching Pain Frequency: Intermittent Pain Onset: On-going Patients Stated Pain Goal: 0 Pain Intervention(s): Medication (See eMAR);Repositioned M  See Function Navigator for Current Functional Status.   Therapy/Group: Individual Therapy  AusLorie Phenix1/2019, 4:26 PM

## 2018-05-06 NOTE — Progress Notes (Addendum)
[place] PHYSICAL MEDICINE & REHABILITATION     PROGRESS NOTE  Subjective/Complaints:  Patient seen lying in bed this morning. He states he slept well overnight. Patient noted to be orthostatic yesterday.  ROS: denies CP, SOB, nausea, vomiting, diarrhea.  Objective: Vital Signs: Blood pressure 101/64, pulse 79, temperature 98.5 F (36.9 C), temperature source Oral, resp. rate 16, weight 78.7 kg (173 lb 8 oz), SpO2 98 %. No results found. No results for input(s): WBC, HGB, HCT, PLT in the last 72 hours. Recent Labs    05/05/18 1153 05/06/18 0905  NA 139 138  K 4.0 3.8  CL 99* 101  GLUCOSE 152* 241*  BUN 30* 29*  CREATININE 1.27* 1.15  CALCIUM 9.4 8.5*   CBG (last 3)  Recent Labs    05/06/18 0638 05/06/18 1127 05/06/18 1628  GLUCAP 120* 115* 197*    Wt Readings from Last 3 Encounters:  04/24/18 78.7 kg (173 lb 8 oz)  04/22/18 81.3 kg (179 lb 3.7 oz)  01/19/18 88.5 kg (195 lb)    Physical Exam:  BP 101/64 (BP Location: Left Arm)   Pulse 79   Temp 98.5 F (36.9 C) (Oral)   Resp 16   Wt 78.7 kg (173 lb 8 oz)   SpO2 98%   BMI 23.53 kg/m  Constitutional: He appears well-developed and well-nourished. NAD. HENT: Normocephalic and atraumatic.  Eyes: EOM are normal. No discharge.  Cardiovascular: RRR. No JVD.  Respiratory: Effort normal and breath sounds normal.  GI: Bowel sounds are normal. He exhibits no distension.  Musculoskeletal: Left hip edema and tenderness, improving  Neurological: He is alert and oriented.  Motor:  Right lower extremity: Hip flexion, knee extension 4+/5, ankle dorsiflexion 4+/5 Left lower extremity: Hip flexion, knee extension 1+/5, ankle dorsiflexion 4+/5 (stable) Skin: Surgical site c/d/i Psychiatric: He has a normal mood and affect. His behavior is normal.   Assessment/Plan: 1. Functional deficits secondary to left hip fracture which require 3+ hours per day of interdisciplinary therapy in a comprehensive inpatient rehab  setting. Physiatrist is providing close team supervision and 24 hour management of active medical problems listed below. Physiatrist and rehab team continue to assess barriers to discharge/monitor patient progress toward functional and medical goals.  Function:  Bathing Bathing position   Position: Sitting EOB  Bathing parts Body parts bathed by patient: Right arm, Left arm, Chest, Abdomen Body parts bathed by helper: Back  Bathing assist Assist Level: Touching or steadying assistance(Pt > 75%)      Upper Body Dressing/Undressing Upper body dressing   What is the patient wearing?: Button up shirt         Button up shirt - Perfomed by patient: Thread/unthread right sleeve, Thread/unthread left sleeve Button up shirt - Perfomed by helper: Pull shirt around back, Button/unbutton shirt, Thread/unthread right sleeve, Thread/unthread left sleeve    Upper body assist Assist Level: Touching or steadying assistance(Pt > 75%)      Lower Body Dressing/Undressing Lower body dressing   What is the patient wearing?: Pants, Non-skid slipper socks, Shoes   Underwear - Performed by helper: Thread/unthread right underwear leg, Thread/unthread left underwear leg, Pull underwear up/down   Pants- Performed by helper: Thread/unthread right pants leg, Thread/unthread left pants leg, Pull pants up/down   Non-skid slipper socks- Performed by helper: Don/doff right sock, Don/doff left sock       Shoes - Performed by helper: Don/doff right shoe, Fasten right          Lower body assist  Assist for lower body dressing: 2 Helpers      Toileting Toileting   Toileting steps completed by patient: Adjust clothing prior to toileting, Adjust clothing after toileting Toileting steps completed by helper: Performs perineal hygiene, Adjust clothing after toileting    Toileting assist Assist level: Two helpers   Transfers Chair/bed transfer   Chair/bed transfer method: Stand pivot Chair/bed transfer  assist level: Maximal assist (Pt 25 - 49%/lift and lower) Chair/bed transfer assistive device: Walker, Armrests     Locomotion Ambulation Ambulation activity did not occur: Safety/medical concerns   Max distance: 45ft Assist level: Moderate assist (Pt 50 - 74%)   Wheelchair Wheelchair activity did not occur: Safety/medical concerns Type: Manual Max wheelchair distance: 185ft  Assist Level: Supervision or verbal cues  Cognition Comprehension Comprehension assist level: Follows basic conversation/direction with no assist  Expression Expression assist level: Expresses complex ideas: With no assist  Social Interaction Social Interaction assist level: Interacts appropriately 75 - 89% of the time - Needs redirection for appropriate language or to initiate interaction.  Problem Solving Problem solving assist level: Solves basic 90% of the time/requires cueing < 10% of the time  Memory Memory assist level: Recognizes or recalls 75 - 89% of the time/requires cueing 10 - 24% of the time    Medical Problem List and Plan: 1.  Decreased functional mobility secondary to left subtrochanteric femur fracture, retained hip fusion hardware placed in the 1950s and broken hardware status post ORIF 04/18/2018.  Touchdown weightbearing x8 weeks   Continue CIR  2.  DVT Prophylaxis/Anticoagulation: Subcutaneous Lovenox.     Vascular study negative 3. Pain Management: Oxycodone as needed, frequency decreased on 5/23  Robaxin when necessary started on 5/23   Unclear picture regarding home narcotic regimen   Relatively controlled on 6/1 4. Mood: Provide emotional support 5. Neuropsych: This patient is capable of making decisions on his own behalf. 6. Skin/Wound Care: Routine skin checks  DCed staples on 5/27 7. Fluids/Electrolytes/Nutrition: Routine in and outs    BMP within acceptable range on 5/21, BUN elevated   Encourage fluids, IVF d/ced 8.  Acute on chronic anemia.  Continue iron supplement    Hemoglobin 8.9 on 5/28   Continue to monitor 9.  Recent fall with left humeral and radial/ulnar fractures. 10.  Type 2 diabetes mellitus.  Hemoglobin A1c 7.6.  SSI.  Check blood sugars before meals and at bedtime (on metformin 1000 twice a day, Amaryl 4 mg daily PTA).   Metformin 500 twice a day started on 5/22, increased to 1000mg  on 5/24   Amaryl 1 mg started on 5/27  CBG (last 3)  Recent Labs    05/06/18 0638 05/06/18 1127 05/06/18 1628  GLUCAP 120* 115* 197*   Relatively controlled on 6/1 11.  Hyperlipidemia.  Pravachol 12.  History of severe aortic stenosis.  Status post bioprosthetic AVR 2012. 13.  History of GI bleed.  Continue Protonix   See #8 14. Hypoalbuminemia   Supplement initiated on 5/21 15. 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

## 2018-05-07 ENCOUNTER — Inpatient Hospital Stay (HOSPITAL_COMMUNITY): Payer: Medicare Other | Admitting: Occupational Therapy

## 2018-05-07 ENCOUNTER — Inpatient Hospital Stay (HOSPITAL_COMMUNITY): Payer: Medicare Other

## 2018-05-07 LAB — GLUCOSE, CAPILLARY
Glucose-Capillary: 114 mg/dL — ABNORMAL HIGH (ref 65–99)
Glucose-Capillary: 103 mg/dL — ABNORMAL HIGH (ref 65–99)
Glucose-Capillary: 120 mg/dL — ABNORMAL HIGH (ref 65–99)
Glucose-Capillary: 129 mg/dL — ABNORMAL HIGH (ref 65–99)

## 2018-05-07 NOTE — Progress Notes (Signed)
Occupational Therapy Session Note  Patient Details  Name: Casey Gilmore MRN: [id number] Date of Birth: [birth year]  Today's Date: 05/07/2018 OT Individual Time: 5631-4970 OT Individual Time Calculation (min): 46 min   Skilled Therapeutic Interventions/Progress Updates:    Pt greeted supine in bed, already dressed. No c/o pain. Supine<sit completed with significantly increased time for L LE mgt and 2 assist due to pain and trunk weakness. Max A sit<stand with OT supporting Rt underarm and NT stabilizing RW. Pt able to maintain L LE TDWB status during stand pivot<w/c with min vcs. Afterwards worked on activity tolerance and UE coordination via oral care and shaving tasks w/c level at sink. Mod A for anterior weight shifting due to body habitus. He also required Min A for meeting FM demands. At end of tx pt agreeable to remain up in w/c. He was left with all needs within reach and safety belt fastened.    Therapy Documentation Precautions:  Precautions Precautions: Fall Restrictions Weight Bearing Restrictions: Yes LLE Weight Bearing: Touchdown weight bearing Other Position/Activity Restrictions: No ROM restrictions noted in note w/ hip, however pt reports pt has been limited to ~45 deg of L hip flexion since a hip surgery he had in the 1950s Vital Signs: Therapy Vitals Temp: 98.7 F (37.1 C) Pulse Rate: 78 Resp: 18 BP: 113/69 Patient Position (if appropriate): Lying Oxygen Therapy SpO2: 97 % O2 Device: Room Air ADL:     Pain: In L LE during functional movements, subsides with rest See Function Navigator for Current Functional Status.   Therapy/Group: Individual Therapy  Sakoya Win A Dorrell [place] 05/07/2018, 4:03 PM

## 2018-05-07 NOTE — Plan of Care (Signed)
  Problem: Consults Goal: RH GENERAL PATIENT EDUCATION Description See Patient Education module for education specifics. Outcome: Progressing   Problem: RH BOWEL ELIMINATION Goal: RH STG MANAGE BOWEL WITH ASSISTANCE Description STG Manage Bowel with Min.Assistance.  Outcome: Progressing Goal: RH STG MANAGE BOWEL W/MEDICATION W/ASSISTANCE Description STG Manage Bowel with Medication with Min.Assistance.  Outcome: Progressing   Problem: RH BLADDER ELIMINATION Goal: RH STG MANAGE BLADDER WITH ASSISTANCE Description STG Manage Bladder With Min. Assistance  Outcome: Progressing   Problem: RH SKIN INTEGRITY Goal: RH STG SKIN FREE OF INFECTION/BREAKDOWN Description With min. Assist.  Outcome: Progressing Goal: RH STG MAINTAIN SKIN INTEGRITY WITH ASSISTANCE Description STG Maintain Skin Integrity With Min. Assistance.  Outcome: Progressing Goal: RH STG ABLE TO PERFORM INCISION/WOUND CARE W/ASSISTANCE Description STG Able To Perform Incision/Wound Care With Min.Assistance.  Outcome: Progressing   Problem: RH SAFETY Goal: RH STG ADHERE TO SAFETY PRECAUTIONS W/ASSISTANCE/DEVICE Description STG Adhere to Safety Precautions With Mod.Assistance/Device.  Outcome: Progressing Goal: RH STG DECREASED RISK OF FALL WITH ASSISTANCE Description STG Decreased Risk of Fall With Mod. Assistance.  Outcome: Progressing   Problem: RH PAIN MANAGEMENT Goal: RH STG PAIN MANAGED AT OR BELOW PT'S PAIN GOAL Description Less than 3,on 1 to 10 scale  Outcome: Progressing

## 2018-05-07 NOTE — Progress Notes (Signed)
Physical Therapy Session Note  Patient Details  Name: Casey Gilmore MRN: [id number] Date of Birth: 1941/06/06  Today's Date: 05/07/2018 PT Individual Time: 8453-6468 PT Individual Time Calculation (min): 55 min   Short Term Goals: Week 2:  PT Short Term Goal 1 (Week 2): Pt will perform bed mobility with mod assist consistently  PT Short Term Goal 2 (Week 2): Pt will ambuate 23f with mod-max assist and LRAD  PT Short Term Goal 3 (Week 2): Pt will propell WC 1533fwith supervision assist consistently  PT Short Term Goal 4 (Week 2): PT will perform sit<>stand with mod assist of 1 consistently   Skilled Therapeutic Interventions/Progress Updates:    Pt received supine in bed and agreeable to PT. Supine>sit transfer with mod assist and min cues for use of bed features. Pt transferred from sit<>stand from elevated EOB with mod assist from PT, cues for set up and hand placement. Pt performed stand pivot with mod assist from bed>w/c using RW, verbal cues for techniques. Pt performed sit<>stand x 3 in rehab gym throughout session from w/c with max assist, verbal cues for techniques. In standing pt worked on standing balance with single UE support while maintaining TDWB precautions with L LE. Pt also performed standing and sitting therex. Standing with BUE support on RW: LLE hip flexion within available range x 10, Hip abduction within available range x 5, LLE knee Flexion x 10. RLE minisquat  X 10. Seated: LAQ within available range BLE 2 x 10 and x 5 L LE heel slides within available range. PT instructed pt in WC mobility x 14024fith supervision and min cues for doorway management and improved use of the LUE to maintain straight path, increased time to complete. Patient returned to room and [facility] and all needs met.        Therapy Documentation Precautions:  Precautions Precautions: Fall Restrictions Weight Bearing Restrictions: Yes LLE Weight Bearing:  Touchdown weight bearing Other Position/Activity Restrictions: No ROM restrictions noted in note w/ hip, however pt reports pt has been limited to ~45 deg of L hip flexion since a hip surgery he had in the 1950s   See Function Navigator for Current Functional Status.   Therapy/Group: Individual Therapy  EmiNetta CorriganT, DPT 05/07/2018, 4:30 PM

## 2018-05-07 NOTE — Progress Notes (Signed)
[place] PHYSICAL MEDICINE & REHABILITATION     PROGRESS NOTE  Subjective/Complaints:  Patient seen lying in bed this morning. He states he slept well overnight. He denies complaints. He denies dizziness upon sitting or standing.  ROS: denies CP, SOB, nausea, vomiting, diarrhea.  Objective: Vital Signs: Blood pressure 131/72, pulse 83, temperature 98 F (36.7 C), resp. rate 17, weight 78.7 kg (173 lb 8 oz), SpO2 96 %. No results found. No results for input(s): WBC, HGB, HCT, PLT in the last 72 hours. Recent Labs    05/05/18 1153 05/06/18 0905  NA 139 138  K 4.0 3.8  CL 99* 101  GLUCOSE 152* 241*  BUN 30* 29*  CREATININE 1.27* 1.15  CALCIUM 9.4 8.5*   CBG (last 3)  Recent Labs    05/06/18 1628 05/06/18 2117 05/07/18 0635  GLUCAP 197* 117* 114*    Wt Readings from Last 3 Encounters:  04/24/18 78.7 kg (173 lb 8 oz)  04/22/18 81.3 kg (179 lb 3.7 oz)  01/19/18 88.5 kg (195 lb)    Physical Exam:  BP 131/72 (BP Location: Right Arm)   Pulse 83   Temp 98 F (36.7 C)   Resp 17   Wt 78.7 kg (173 lb 8 oz)   SpO2 96%   BMI 23.53 kg/m  Constitutional: He appears well-developed and well-nourished. NAD. HENT: Normocephalic and atraumatic.  Eyes: EOM are normal. No discharge.  Cardiovascular: RRR. No JVD.  Respiratory: Effort normal and breath sounds normal.  GI: Bowel sounds are normal. He exhibits no distension.  Musculoskeletal: Left hip edema and tenderness, improving  Neurological: He is alert and oriented.  Motor:  Right lower extremity: Hip flexion, knee extension 4+/5, ankle dorsiflexion 4+/5 Left lower extremity: Hip flexion, knee extension 1+/5, ankle dorsiflexion 4+/5 (unchanged) Skin: Surgical site c/d/i Psychiatric: He has a normal mood and affect. His behavior is normal.   Assessment/Plan: 1. Functional deficits secondary to left hip fracture which require 3+ hours per day of interdisciplinary therapy in a comprehensive inpatient rehab  setting. Physiatrist is providing close team supervision and 24 hour management of active medical problems listed below. Physiatrist and rehab team continue to assess barriers to discharge/monitor patient progress toward functional and medical goals.  Function:  Bathing Bathing position   Position: Sitting EOB  Bathing parts Body parts bathed by patient: Right arm, Left arm, Chest, Abdomen Body parts bathed by helper: Back  Bathing assist Assist Level: Touching or steadying assistance(Pt > 75%)      Upper Body Dressing/Undressing Upper body dressing   What is the patient wearing?: Button up shirt         Button up shirt - Perfomed by patient: Thread/unthread right sleeve, Thread/unthread left sleeve Button up shirt - Perfomed by helper: Pull shirt around back, Button/unbutton shirt, Thread/unthread right sleeve, Thread/unthread left sleeve    Upper body assist Assist Level: Touching or steadying assistance(Pt > 75%)      Lower Body Dressing/Undressing Lower body dressing   What is the patient wearing?: Pants, Non-skid slipper socks, Shoes   Underwear - Performed by helper: Thread/unthread right underwear leg, Thread/unthread left underwear leg, Pull underwear up/down   Pants- Performed by helper: Thread/unthread right pants leg, Thread/unthread left pants leg, Pull pants up/down   Non-skid slipper socks- Performed by helper: Don/doff right sock, Don/doff left sock       Shoes - Performed by helper: Don/doff right shoe, Fasten right          Lower body assist  Assist for lower body dressing: 2 Helpers      Toileting Toileting   Toileting steps completed by patient: Adjust clothing prior to toileting, Adjust clothing after toileting Toileting steps completed by helper: Performs perineal hygiene, Adjust clothing after toileting, Adjust clothing prior to toileting [place]: Grab bar or rail, Other (comment)(walker)  Toileting assist Assist level: Two  helpers   Transfers Chair/bed transfer   Chair/bed transfer method: Stand pivot Chair/bed transfer assist level: Maximal assist (Pt 25 - 49%/lift and lower) Chair/bed transfer assistive device: Walker, Air cabin crew Ambulation activity did not occur: Safety/medical concerns   Max distance: 82ft Assist level: Moderate assist (Pt 50 - 74%)   Wheelchair Wheelchair activity did not occur: Safety/medical concerns Type: Manual Max wheelchair distance: 127ft  Assist Level: Supervision or verbal cues  Cognition Comprehension Comprehension assist level: Follows basic conversation/direction with no assist  Expression Expression assist level: Expresses complex ideas: With no assist  Social Interaction Social Interaction assist level: Interacts appropriately 75 - 89% of the time - Needs redirection for appropriate language or to initiate interaction.  Problem Solving Problem solving assist level: Solves basic 90% of the time/requires cueing < 10% of the time  Memory Memory assist level: Recognizes or recalls 75 - 89% of the time/requires cueing 10 - 24% of the time    Medical Problem List and Plan: 1.  Decreased functional mobility secondary to left subtrochanteric femur fracture, retained hip fusion hardware placed in the 1950s and broken hardware status post ORIF 04/18/2018.  Touchdown weightbearing x8 weeks   Continue CIR  2.  DVT Prophylaxis/Anticoagulation: Subcutaneous Lovenox.     Vascular study negative 3. Pain Management: Oxycodone as needed, frequency decreased on 5/23  Robaxin when necessary started on 5/23   Unclear picture regarding home narcotic regimen   Controlled on 6/2 4. Mood: Provide emotional support 5. Neuropsych: This patient is capable of making decisions on his own behalf. 6. Skin/Wound Care: Routine skin checks  DCed staples on 5/27 7. Fluids/Electrolytes/Nutrition: Routine in and outs    BMP within acceptable range on 5/21, BUN  elevated  Encourage fluids, IVF d/ced 8.  Acute on chronic anemia.  Continue iron supplement   Hemoglobin 8.9 on 5/28   Continue to monitor 9.  Recent fall with left humeral and radial/ulnar fractures. 10.  Type 2 diabetes mellitus.  Hemoglobin A1c 7.6.  SSI.  Check blood sugars before meals and at bedtime (on metformin 1000 twice a day, Amaryl 4 mg daily PTA).   Metformin 500 twice a day started on 5/22, increased to 1000mg  on 5/24   Amaryl 1 mg started on 5/27  CBG (last 3)  Recent Labs    05/06/18 1628 05/06/18 2117 05/07/18 0635  GLUCAP 197* 117* 114*   Slightly labile, but overall controlled on 6/2 11.  Hyperlipidemia.  Pravachol 12.  History of severe aortic stenosis.  Status post bioprosthetic AVR 2012. 13.  History of GI bleed.  Continue Protonix   See #8 14. Hypoalbuminemia   Supplement initiated on 5/21 15. 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

## 2018-05-08 ENCOUNTER — Inpatient Hospital Stay (HOSPITAL_COMMUNITY): Payer: Medicare Other | Admitting: Physical Therapy

## 2018-05-08 ENCOUNTER — Inpatient Hospital Stay (HOSPITAL_COMMUNITY): Payer: Medicare Other | Admitting: Occupational Therapy

## 2018-05-08 ENCOUNTER — Inpatient Hospital Stay (HOSPITAL_COMMUNITY): Payer: Medicare Other

## 2018-05-08 LAB — GLUCOSE, CAPILLARY
GLUCOSE-CAPILLARY: 110 mg/dL — AB (ref 65–99)
GLUCOSE-CAPILLARY: 113 mg/dL — AB (ref 65–99)
GLUCOSE-CAPILLARY: 111 mg/dL — AB (ref 65–99)
Glucose-Capillary: 100 mg/dL — ABNORMAL HIGH (ref 65–99)

## 2018-05-08 LAB — CREATININE, SERUM
Creatinine, Ser: 1.08 mg/dL (ref 0.61–1.24)
GFR calc non Af Amer: >60 mL/min (ref >60)

## 2018-05-08 NOTE — Progress Notes (Signed)
[facility] PHYSICAL MEDICINE & REHABILITATION     PROGRESS NOTE  Subjective/Complaints:  Patient seen lying in bed this morning.  He states she slept well overnight.  ROS: Denies CP, SOB, nausea, vomiting, diarrhea.  Objective: Vital Signs: Blood pressure 123/67, pulse 85, temperature 98.7 F (37.1 C), temperature source Oral, resp. rate 16, weight 78.7 kg (173 lb 8 oz), SpO2 97 %. No results found. No results for input(s): WBC, HGB, HCT, PLT in the last 72 hours. Recent Labs    05/05/18 1153 05/06/18 0905  NA 139 138  K 4.0 3.8  CL 99* 101  GLUCOSE 152* 241*  BUN 30* 29*  CREATININE 1.27* 1.15  CALCIUM 9.4 8.5*   CBG (last 3)  Recent Labs    05/07/18 1658 05/07/18 2116 05/08/18 0630  GLUCAP 120* 129* 110*    Wt Readings from Last 3 Encounters:  04/24/18 78.7 kg (173 lb 8 oz)  04/22/18 81.3 kg (179 lb 3.7 oz)  01/19/18 88.5 kg (195 lb)    Physical Exam:  BP 123/67 (BP Location: Left Arm)   Pulse 85   Temp 98.7 F (37.1 C) (Oral)   Resp 16   Wt 78.7 kg (173 lb 8 oz)   SpO2 97%   BMI 23.53 kg/m  Constitutional: He appears well-developed and well-nourished. NAD. HENT: Normocephalic and atraumatic.  Eyes: EOM are normal. No discharge.  Cardiovascular: RRR. No JVD.  Respiratory: Effort normal and breath sounds normal.  GI: Bowel sounds are normal. He exhibits no distension.  Musculoskeletal: Left hip edema and tenderness, improving  Neurological: He is alert and oriented.  Motor:  Right lower extremity: Hip flexion, knee extension 4+/5, ankle dorsiflexion 4+/5 Left lower extremity: Hip flexion, knee extension 1+/5, ankle dorsiflexion 4+/5 (stable) Skin: Surgical site c/d/i Psychiatric: He has a normal mood and affect. His behavior is normal.   Assessment/Plan: 1. Functional deficits secondary to left hip fracture which require 3+ hours per day of interdisciplinary therapy in a comprehensive inpatient rehab setting. Physiatrist is providing close team  supervision and 24 hour management of active medical problems listed below. Physiatrist and rehab team continue to assess barriers to discharge/monitor patient progress toward functional and medical goals.  Function:  Bathing Bathing position   Position: Sitting EOB  Bathing parts Body parts bathed by patient: Right arm, Left arm, Chest, Abdomen Body parts bathed by helper: Back  Bathing assist Assist Level: Touching or steadying assistance(Pt > 75%)      Upper Body Dressing/Undressing Upper body dressing   What is the patient wearing?: Button up shirt         Button up shirt - Perfomed by patient: Thread/unthread right sleeve, Thread/unthread left sleeve Button up shirt - Perfomed by helper: Pull shirt around back, Button/unbutton shirt, Thread/unthread right sleeve, Thread/unthread left sleeve    Upper body assist Assist Level: Touching or steadying assistance(Pt > 75%)      Lower Body Dressing/Undressing Lower body dressing   What is the patient wearing?: Pants, Non-skid slipper socks, Shoes   Underwear - Performed by helper: Thread/unthread right underwear leg, Thread/unthread left underwear leg, Pull underwear up/down   Pants- Performed by helper: Thread/unthread right pants leg, Thread/unthread left pants leg, Pull pants up/down   Non-skid slipper socks- Performed by helper: Don/doff right sock, Don/doff left sock       Shoes - Performed by helper: Don/doff right shoe, Fasten right          Lower body assist Assist for lower body dressing:  2 Helpers      Naval architect activity did not occur: N/A Toileting steps completed by patient: Adjust clothing prior to toileting, Adjust clothing after toileting Toileting steps completed by helper: Performs perineal hygiene, Adjust clothing after toileting, Adjust clothing prior to toileting [place]: Grab bar or rail, Other (comment)(walker)  Toileting assist Assist level: Two helpers    Transfers Chair/bed transfer   Chair/bed transfer [place]: Stand pivot Chair/bed transfer assist level: Moderate assist (Pt 50 - 74%/lift or lower) Chair/bed transfer assistive device: Walker, Air cabin crew Ambulation activity did not occur: Safety/medical concerns   Max distance: 78ft Assist level: Moderate assist (Pt 50 - 74%)   Wheelchair Wheelchair activity did not occur: Safety/medical concerns Type: Manual Max wheelchair distance: 177ft  Assist Level: Supervision or verbal cues  Cognition Comprehension Comprehension assist level: Follows basic conversation/direction with no assist  Expression Expression assist level: Expresses complex ideas: With no assist  Social Interaction Social Interaction assist level: Interacts appropriately with others with medication or extra time (anti-anxiety, antidepressant).  Problem Solving Problem solving assist level: Solves basic 90% of the time/requires cueing < 10% of the time  Memory Memory assist level: Recognizes or recalls 90% of the time/requires cueing < 10% of the time    Medical Problem List and Plan: 1.  Decreased functional mobility secondary to left subtrochanteric femur fracture, retained hip fusion hardware placed in the 1950s and broken hardware status post ORIF 04/18/2018.  Touchdown weightbearing x8 weeks   Continue CIR  2.  DVT Prophylaxis/Anticoagulation: Subcutaneous Lovenox.     Vascular study negative 3. Pain Management: Oxycodone as needed, frequency decreased on 5/23  Robaxin when necessary started on 5/23   Unclear picture regarding home narcotic regimen   Controlled on 6/3 4. Mood: Provide emotional support 5. Neuropsych: This patient is capable of making decisions on his own behalf. 6. Skin/Wound Care: Routine skin checks  DCed staples on 5/27 7. Fluids/Electrolytes/Nutrition: Routine in and outs    BMP within acceptable range on 5/21, BUN elevated  Encourage fluids, IVF d/ced 8.  Acute  on chronic anemia.  Continue iron supplement   Hemoglobin 8.9 on 5/28  Labs ordered for tomorrow   Continue to monitor 9.  Recent fall with left humeral and radial/ulnar fractures. 10.  Type 2 diabetes mellitus.  Hemoglobin A1c 7.6.  SSI.  Check blood sugars before meals and at bedtime (on metformin 1000 twice a day, Amaryl 4 mg daily PTA).   Metformin 500 twice a day started on 5/22, increased to 1000mg  on 5/24   Amaryl 1 mg started on 5/27  CBG (last 3)  Recent Labs    05/07/18 1658 05/07/18 2116 05/08/18 0630  GLUCAP 120* 129* 110*   Relatively controlled on 6/3 11.  Hyperlipidemia.  Pravachol 12.  History of severe aortic stenosis.  Status post bioprosthetic AVR 2012. 13.  History of GI bleed.  Continue Protonix   See #8 14. Hypoalbuminemia   Supplement initiated on 5/21 15. Constipation  Bowel regiment increased on 5/23 16. Orthostatic hypertension  Orthostatic vital signs WNL on 6/1   LOS (Days) 14 A FACE TO FACE EVALUATION WAS PERFORMED  Casey Gilmore [name] 05/08/2018 8:09 AM

## 2018-05-08 NOTE — Progress Notes (Signed)
Occupational Therapy Session Note  Patient Details  Name: Casey Gilmore MRN: [id number] Date of Birth: 09/05/1941  Today's Date: 05/08/2018 OT Individual Time: 1100-1200 and 1445-1530 OT Individual Time Calculation (min): 60 min and 45 min   Short Term Goals: Week 2:  OT Short Term Goal 1 (Week 2): Pt will complete sit > stand with mod assist of 1 caregiver in preparation to complete LB dressing at sit > stand level OT Short Term Goal 2 (Week 2): Pt will complete toilet transfer with mod assist of 1 caregiver OT Short Term Goal 3 (Week 2): Pt will complete LB dressing with max assist and use of AE as needed  Skilled Therapeutic Interventions/Progress Updates:    1) Treatment session with focus on ADL retraining with use of AE for bathing and dressing at sit > stand level.  Pt received upright in w/c willing to complete self-care tasks.  Pt doffed button up shirt with assist to unbutton as pt reports utilizing button hook at home.  Pt able to wash UB and utilize long handled sponge to wash lower legs, required +2 for safety with sit > stand with 2nd person stabilizing RW while therapist provided mod assist (under Rt UE) to complete transitional movement from sit > stand.  Pt able to tolerate standing with min assist while therapist assisted with washing buttocks.  Educated on use of reacher for LB dressing with doffing socks and donning pants, pt still requiring assist to thread LLE due to fearfulness and pain with movement.  Completed sit > stand as above and therapist pulled pants over hips while pt maintained standing with min assist and 2nd person for safety.  Pt demonstrating improved anterior weight shift for UB bathing/dressing and sit > stand.    2) Treatment session with focus on sit <> stand and stand pivot transfers.  Pt received upright in w/c willing to engage in treatment session.  Engaged in blocked practice stand pivot transfers w/c <> therapy mat x2 with focus on increased  anterior weight shift for sit > stand.  Pt able to complete sit > stand with mod assist from w/c, pushing up from Rt arm rest but requiring max assist +2 when pushing up from therapy mat without arm rest for support and increased height.  Pt continues to require increased time and cues for sequencing of task due to fearfulness of falling.  +2 present during all sit <> stand and transfers for safety, however only required assistance when coming to standing from therapy mat without UE support.  Stand pivot w/c to bed with mod assist and min cues for transfer once upright.  Required increased cues for lowering down to EOB with pt getting anxious during descent and insisting on +2 assist due to fearfulness.  Therapist assisted with lifting LLE in to bed while pt boosted hips to guide movement.  Therapy Documentation Precautions:  Precautions Precautions: Fall Restrictions Weight Bearing Restrictions: Yes LLE Weight Bearing: Touchdown weight bearing Other Position/Activity Restrictions: No ROM restrictions noted in note w/ hip, however pt reports pt has been limited to ~45 deg of L hip flexion since a hip surgery he had in the 1950s Pain: Pain Assessment Pain Scale: 0-10 Pain Score: 4  Pain Type: Chronic pain Pain Location: Back Pain Orientation: Lower Pain Descriptors / Indicators: Aching;Discomfort Pain Intervention(s): Medication (See eMAR)  See Function Navigator for Current Functional Status.   Therapy/Group: Individual Therapy  [name] 05/08/2018, 12:03 PM

## 2018-05-08 NOTE — Progress Notes (Signed)
Physical Therapy Session Note  Patient Details  Name: Casey Gilmore MRN: [id number] Date of Birth: 03/10/1941  Today's Date: 05/08/2018 PT Individual Time: 0800-0900 1400-1430 PT Individual Time Calculation (min): 60 min and 30 min  Short Term Goals: Week 2:  PT Short Term Goal 1 (Week 2): Pt will perform bed mobility with mod assist consistently  PT Short Term Goal 2 (Week 2): Pt will ambuate 30f with mod-max assist and LRAD  PT Short Term Goal 3 (Week 2): Pt will propell WC 1569fwith supervision assist consistently  PT Short Term Goal 4 (Week 2): PT will perform sit<>stand with mod assist of 1 consistently   Skilled Therapeutic Interventions/Progress Updates: Tx1: Pt presented in bed agreeable to therapy. Pt performed supine to sit with modA and increased time with use of features. Pt required assist for truncal support and PTA provided support to LLE during transitional movements. Pt encouraged to use bed rails to facilitate transfer. Performed stand pivot with RW to w/c with modA and pt shifted foot to perform pivot. Pt able to control descent into w/c with minA. Pt propelled w/c to rehab gym with several breaks due to fatigue with supervision. Participated in sit to stand from w/c modA x 1 and performed x 5 hops with RW for gait training. Pt indicated may have had episode of incontinence during hops.  Pt returned to w/c and transported back to room. Performed sit to stand in [place] for LLE placement and safety. Pt able to maintain standing in [place] performed peri-care (+BM). Pt returned to w/c modA x 2 and remained in w/c with NT present to complete pt set up.   Tx2: Pt presented in w/c agreeable to therapy. Session focused on RLE therex. Pt trasported to rehab gym and participated in LAQ 3 x 10 with 2lb , hip flexion 3x 10 AROM, w/c press ups x5. Pt required rest breaks between sets due to increased pain in L hip and fatigue. Pt returned to room and remained in w/c at end  of session with call bell within reach and needs met.      Therapy Documentation Precautions:  Precautions Precautions: Fall Restrictions Weight Bearing Restrictions: Yes LLE Weight Bearing: Touchdown weight bearing Other Position/Activity Restrictions: No ROM restrictions noted in note w/ hip, however pt reports pt has been limited to ~45 deg of L hip flexion since a hip surgery he had in the 1950s General:   Vital Signs: Therapy Vitals Temp: (!) 97.5 F (36.4 C) Temp Source: Oral Pulse Rate: 84 Resp: 14 BP: (!) 117/55 Patient Position (if appropriate): Sitting Oxygen Therapy SpO2: 96 % O2 Device: Room Air Pain: Pain Assessment Pain Score: 3   See Function Navigator for Current Functional Status.   Therapy/Group: Individual Therapy  Brycelyn Gambino  Leilanni Halvorson, PTA  05/08/2018, 3:52 PM

## 2018-05-09 ENCOUNTER — Inpatient Hospital Stay (HOSPITAL_COMMUNITY): Payer: Medicare Other | Admitting: *Deleted

## 2018-05-09 ENCOUNTER — Inpatient Hospital Stay (HOSPITAL_COMMUNITY): Payer: Medicare Other | Admitting: Occupational Therapy

## 2018-05-09 ENCOUNTER — Inpatient Hospital Stay (HOSPITAL_COMMUNITY): Payer: Medicare Other | Admitting: Physical Therapy

## 2018-05-09 LAB — CBC WITH DIFFERENTIAL/PLATELET
ABS IMMATURE GRANULOCYTES: 0 10*3/uL (ref 0.0–0.1)
BASOS ABS: 0.1 10*3/uL (ref 0.0–0.1)
BASOS PCT: 1 %
Eosinophils Absolute: 0.2 10*3/uL (ref 0.0–0.7)
Eosinophils Relative: 3 %
HCT: 32.5 % — ABNORMAL LOW (ref 39.0–52.0)
Hemoglobin: 9.4 g/dL — ABNORMAL LOW (ref 13.0–17.0)
Immature Granulocytes: 0 %
Lymphocytes Relative: 31 %
Lymphs Abs: 2.1 10*3/uL (ref 0.7–4.0)
MCH: 23.9 pg — ABNORMAL LOW (ref 26.0–34.0)
MCHC: 28.9 g/dL — ABNORMAL LOW (ref 30.0–36.0)
MCV: 82.7 fL (ref 78.0–100.0)
MONOS PCT: 14 %
Monocytes Absolute: 1 10*3/uL (ref 0.1–1.0)
NEUTROS ABS: 3.5 10*3/uL (ref 1.7–7.7)
Neutrophils Relative %: 51 %
PLATELETS: 284 10*3/uL (ref 150–400)
RBC: 3.93 MIL/uL — ABNORMAL LOW (ref 4.22–5.81)
RDW: 18.3 % — ABNORMAL HIGH (ref 11.5–15.5)
WBC: 6.8 10*3/uL (ref 4.0–10.5)

## 2018-05-09 LAB — GLUCOSE, CAPILLARY
GLUCOSE-CAPILLARY: 114 mg/dL — AB (ref 65–99)
GLUCOSE-CAPILLARY: 109 mg/dL — AB (ref 65–99)
GLUCOSE-CAPILLARY: 121 mg/dL — AB (ref 65–99)
Glucose-Capillary: 117 mg/dL — ABNORMAL HIGH (ref 65–99)

## 2018-05-09 NOTE — Progress Notes (Signed)
Occupational Therapy Session Note  Patient Details  Name: Casey Gilmore MRN: [id number] Date of Birth: 01-17-1941  Today's Date: 05/09/2018 OT Individual Time: 1100-1200 OT Individual Time Calculation (min): 60 min    Short Term Goals: Week 2:  OT Short Term Goal 1 (Week 2): Pt will complete sit > stand with mod assist of 1 caregiver in preparation to complete LB dressing at sit > stand level OT Short Term Goal 2 (Week 2): Pt will complete toilet transfer with mod assist of 1 caregiver OT Short Term Goal 3 (Week 2): Pt will complete LB dressing with max assist and use of AE as needed  Skilled Therapeutic Interventions/Progress Updates:    Treatment session with focus on ADL retraining with sit > stand and LB dressing with use of AE to decrease burden of care.  Pt received upright in w/c reporting pain but willing to engage in treatment session - RN arrived during session to provide pain meds.  Completed UB bathing with setup assist and pt able to don shirt this session with min assist to bring shirt around back and pt able to utilize button hook to fasten shirt.  Engaged in LB bathing and dressing at sit > stand level, pt able to utilize long handled sponge to wash lower legs.  Pt required +2 for safety with sit > stand with 2nd person stabilizing RW and pt only requiring mod assist sit > stand from w/c.  Pt tolerated standing with min assist while therapist washed buttocks.  Pt donned Rt pant leg with reacher and required assistance for LLE.  While in standing pt able to pull pants over hips with increased time while therapist provided min assist for standing balance.  2nd person present during standing for emotional support and intermittent stability of RW.  Utilized reacher and long handled shoe horn to don Rt shoe, therapist assisted with tying shoe.  Pt demonstrating improved sequencing with sit <> stand and during dynamic standing this session.  Therapy Documentation Precautions:   Precautions Precautions: Fall Restrictions Weight Bearing Restrictions: Yes LLE Weight Bearing: Touchdown weight bearing Other Position/Activity Restrictions: No ROM restrictions noted in note w/ hip, however pt reports pt has been limited to ~45 deg of L hip flexion since a hip surgery he had in the 1950s General:   Vital Signs: Therapy Vitals Pulse Rate: 90 BP: 130/71 Patient Position (if appropriate): Sitting Pain: Pain Assessment Pain Scale: 0-10 Pain Score: 4  Faces Pain Scale: Hurts whole lot Pain Type: Chronic pain Pain Location: Back Pain Descriptors / Indicators: Aching;Discomfort;Constant Pain Intervention(s): Medication (See eMAR)  See Function Navigator for Current Functional Status.   Therapy/Group: Individual Therapy  [name] 05/09/2018, 12:49 PM

## 2018-05-09 NOTE — NC FL2 (Signed)
[place] MEDICAID FL2 LEVEL OF CARE SCREENING TOOL     IDENTIFICATION  Patient Name: Casey Gilmore Birthdate: [birth year] Sex: male Admission Date (Current Location): 04/24/2018  [facility] and [place] Number:  Herbalist and Address:  [facility], [place] [address] [place], [place] [address]      Provider Number: [id number]  Attending Physician Name and Address:  Jamse Arn, MD  Relative Name and Phone Number:       Current Level of Care: Other (Comment)(Acute Inpatient Rehab) Recommended Level of Care: [place] Prior Approval Number:    Date Approved/Denied:   PASRR Number: [id number]  Discharge Plan: SNF    Current Diagnoses: Patient Active Problem List   Diagnosis Date Noted  . Orthostatic hypotension   . Chronic pain syndrome   . Constipation due to pain medication   . Postoperative pain   . Hypoalbuminemia due to protein-calorie malnutrition ([place])   . Closed left subtrochanteric femur fracture ([place]) 04/24/2018  . Acute blood loss anemia   . Anemia of chronic disease   . Diabetes mellitus type 2 in nonobese (H[facility])   . Hyperlipidemia   . Aortic valve stenosis   . History of GI bleed   . Anemia 04/17/2018  . Closed left hip fracture ([place]) 04/15/2018  . Closed fracture of left proximal humerus 01/19/2018  . Closed fracture of left distal radius and ulna 01/19/2018  . 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 Chronic diastolic heart failure ([place]) 06/24/2014  . Benign essential HTN 06/24/2014  . 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 History of non-ST elevation myocardial infarction (NSTEMI) 06/24/2014  . Diabetes mellitus ([place]) 10/17/2011  . Osteoarthritis 10/17/2011    Orientation RESPIRATION BLADDER Height & Weight     Self, Time, Situation, Place  Normal Continent Weight: 78.7 kg (173 lb 8  oz) Height:     BEHAVIORAL SYMPTOMS/MOOD NEUROLOGICAL BOWEL NUTRITION STATUS      Continent Diet(carb modified)  AMBULATORY STATUS COMMUNICATION OF NEEDS Skin   Extensive Assist Verbally Surgical wounds                       Personal Care Assistance Level of Assistance  Bathing, Feeding, Dressing, Total care Bathing Assistance: Maximum assistance Feeding assistance: Limited assistance Dressing Assistance: Maximum assistance Total Care Assistance: Maximum assistance   Functional Limitations Info             SPECIAL CARE FACTORS FREQUENCY  PT (By licensed PT), OT (By licensed OT)     PT Frequency: 5x/wk OT Frequency: 5x/wk            Contractures Contractures Info: Not present    Additional Factors Info  Code Status, Allergies, Insulin Sliding Scale Code Status Info: Full Allergies Info: NKDA   Insulin Sliding Scale Info: 0-9 3x/day       Current Medications (05/09/2018):  This is the current hospital active medication list Current Facility-Administered Medications  Medication Dose Route Frequency Provider Last Rate Last Dose  . acetaminophen (TYLENOL) tablet 325-650 mg  325-650 mg Oral Q4H PRN Cathlyn Parsons, PA-C   650 mg at 05/08/18 1933  . bisacodyl (DULCOLAX) suppository 10 mg  10 mg Rectal Daily PRN Angiulli, Lavon Paganini, PA-C      . enoxaparin (LOVENOX) injection 40 mg  40 mg Subcutaneous Q24H Angiulli, Daniel J, PA-C   40 mg at  05/09/18 0807  . feeding supplement (PRO-STAT SUGAR FREE 64) liquid 30 mL  30 mL Oral BID Jamse Arn, MD   30 mL at 05/09/18 0807  . ferrous sulfate tablet 325 mg  325 mg Oral BID WC Cathlyn Parsons, PA-C   325 mg at 05/09/18 1245  . furosemide (LASIX) tablet 40 mg  40 mg Oral Daily Cathlyn Parsons, PA-C   40 mg at 05/09/18 8099  . glimepiride (AMARYL) tablet 1 mg  1 mg Oral Q breakfast Jamse Arn, MD   1 mg at 05/09/18 0807  . insulin aspart (novoLOG) injection 0-5 Units  0-5 Units Subcutaneous QHS Cathlyn Parsons, PA-C   3 Units at 04/27/18 2117  . metFORMIN (GLUCOPHAGE) tablet 1,000 mg  1,000 mg Oral BID WC Jamse Arn, MD   1,000 mg at 05/09/18 0807  . methocarbamol (ROBAXIN) tablet 500 mg  500 mg Oral Q6H PRN Jamse Arn, MD   500 mg at 05/09/18 1119  . ondansetron (ZOFRAN) injection 4 mg  4 mg Intravenous Q6H PRN Angiulli, Lavon Paganini, PA-C      . ondansetron [facility]) tablet 4 mg  4 mg Oral Q8H PRN Angiulli, Lavon Paganini, PA-C      . oxyCODONE (Oxy IR/ROXICODONE) immediate release tablet 5-10 mg  5-10 mg Oral Q6H PRN Jamse Arn, MD   10 mg at 05/09/18 1119  . pantoprazole (PROTONIX) EC tablet 40 mg  40 mg Oral Daily Cathlyn Parsons, PA-C   40 mg at 05/09/18 8338  . polyethylene glycol (MIRALAX / GLYCOLAX) packet 17 g  17 g Oral BID Jamse Arn, MD   17 g at 05/07/18 0807  . pravastatin (PRAVACHOL) tablet 20 mg  20 mg Oral Daily Cathlyn Parsons, PA-C   20 mg at 05/09/18 2505  . senna-docusate (Senokot-S) tablet 1 tablet  1 tablet Oral QHS Jamse Arn, MD   1 tablet at 05/06/18 2016  . sorbitol 70 % solution 30 mL  30 mL Oral Daily PRN Cathlyn Parsons, PA-C   30 mL at 04/28/18 1317  . vitamin B-12 (CYANOCOBALAMIN) tablet 4,000 mcg  4,000 mcg Oral Daily Cathlyn Parsons, PA-C   4,000 mcg at 05/09/18 3976  . Vitamin D (Ergocalciferol) (DRISDOL) capsule 50,000 Units  50,000 Units Oral Q7 days Cathlyn Parsons, PA-C   50,000 Units at 05/07/18 7341     Discharge Medications: Please see discharge summary for a list of discharge medications.  Relevant Imaging Results:  Relevant Lab Results:   Additional Information SS#: [id number]  Lennart Pall, LCSW

## 2018-05-09 NOTE — Progress Notes (Signed)
Physical Therapy Session Note  Patient Details  Name: Casey Gilmore MRN: [id number] Date of Birth: 05/08/1941  Today's Date: 05/09/2018 PT Individual Time: 0900-1000 and 1335-1445 PT Individual Time Calculation (min): 60 min and 70 min  Short Term Goals: Week 2:  PT Short Term Goal 1 (Week 2): Pt will perform bed mobility with mod assist consistently  PT Short Term Goal 2 (Week 2): Pt will ambuate 48f with mod-max assist and LRAD  PT Short Term Goal 3 (Week 2): Pt will propell WC 1536fwith supervision assist consistently  PT Short Term Goal 4 (Week 2): PT will perform sit<>stand with mod assist of 1 consistently   Skilled Therapeutic Interventions/Progress Updates: Tx1: Pt presented in bed agreeable to therapy. Session focused on functional mobility and standing tolerance. Pt performed supine to sit modA with PTA managing LLE and pt pivoting in bed and using bed rail to assist with truncal support. Pt able to scoot to EOB with min guard and pt using RLE to manage LLE. Performed sit to stand from elevated bed with modA and performed stand pivot transfer to w/c with modA. Required minA for stand to sit at w/c for controlled descent and pain management. Pt transported partial distance to rehab gym and propelled approx 5033fith BUE and supervision. Attempted gait with RW however pt indicating too much pain with TDWB and unable to perform hop at this time with encouragement. Pt indicating unable to receive any further pain meds at this time. Participated in standing tolerance with pt performing sit to stand modA x 1 and participated in ball taps up to 2 min. Pt noted slight dyspnea after ball taps. Upon second attempt pt indicated feeling "lightheaded" which was not resolving. Pt returned to siitting BP checked  (see vitals). Pt returned to room and remained in w/c with call bell within reach, quick release belt placed, and needs met.  Tx2: Pt presented in w/c agreeable for therapy. Pt transported  to rehab gym for energy conservation. Attempted x 2 trials of gait with pt performing sit to stand with heavy modA. Pt attempted to perform TDWB to initialize gait however pt continues to be limited by pain at this time. Pt with c/o pain in hip and low back with each trial. Pt transported to parallel bars and performed sit to stand with minA. Pt able to maintain standing tolerance while performing LLE hip/knee ROM in available range and WLP and  Performed mini SL squats with RLE. Pt required increased time between bouts. Transported back to room and performed stand pivot w/c to bed modA and returned to supine with modA and PTA only provided assist for LLE management. Pt able to use RLE to assist [facility] comfort, bed alarm set, and needs met.      Therapy Documentation Precautions:  Precautions Precautions: Fall Restrictions Weight Bearing Restrictions: Yes LLE Weight Bearing: Touchdown weight bearing Other Position/Activity Restrictions: No ROM restrictions noted in note w/ hip, however pt reports pt has been limited to ~45 deg of L hip flexion since a hip surgery he had in the 1950s General:   Vital Signs: Therapy Vitals Pulse Rate: 86 Resp: 19 BP: 108/77 Patient Position (if appropriate): Lying Oxygen Therapy SpO2: 99 % O2 Device: Room Air Pain: Pain Assessment Pain Score: 4    See Function Navigator for Current Functional Status.   Therapy/Group: Individual Therapy  Rhyann Berton  Lea Baine, PTA  05/09/2018, 4:02 PM

## 2018-05-09 NOTE — Progress Notes (Signed)
[place] PHYSICAL MEDICINE & REHABILITATION     PROGRESS NOTE  Subjective/Complaints:  Patient seen lying in bed this morning. Dates he slept well overnight. He denies complaints.  ROS: denies CP, SOB, nausea, vomiting, diarrhea.  Objective: Vital Signs: Blood pressure 119/62, pulse 82, temperature 98.1 F (36.7 C), temperature source Oral, resp. rate 15, weight 78.7 kg (173 lb 8 oz), SpO2 95 %. No results found. Recent Labs    05/09/18 0519  WBC 6.8  HGB 9.4*  HCT 32.5*  PLT 284   Recent Labs    05/06/18 0905 05/08/18 0720  NA 138  --   K 3.8  --   CL 101  --   GLUCOSE 241*  --   BUN 29*  --   CREATININE 1.15 1.08  CALCIUM 8.5*  --    CBG (last 3)  Recent Labs    05/08/18 1642 05/08/18 2205 05/09/18 0700  GLUCAP 111* 100* 114*    Wt Readings from Last 3 Encounters:  04/24/18 78.7 kg (173 lb 8 oz)  04/22/18 81.3 kg (179 lb 3.7 oz)  01/19/18 88.5 kg (195 lb)    Physical Exam:  BP 119/62 (BP Location: Left Arm)   Pulse 82   Temp 98.1 F (36.7 C) (Oral)   Resp 15   Wt 78.7 kg (173 lb 8 oz)   SpO2 95%   BMI 23.53 kg/m  Constitutional: He appears well-developed and well-nourished. NAD. HENT: Normocephalic and atraumatic.  Eyes: EOM are normal. No discharge.  Cardiovascular: RRR. No JVD.  Respiratory: Effort normal and breath sounds normal.  GI: Bowel sounds are normal. He exhibits no distension.  Musculoskeletal: Left hip edema and tenderness, improving  Neurological: He is alert and oriented.  Motor:  Right lower extremity: Hip flexion, knee extension 4+/5, ankle dorsiflexion 4+/5 (unchanged) Left lower extremity: Hip flexion, knee extension 1+/5, ankle dorsiflexion 4+/5 (unchanged) Skin: Surgical site c/d/i Psychiatric: He has a normal mood and affect. His behavior is normal.   Assessment/Plan: 1. Functional deficits secondary to left hip fracture which require 3+ hours per day of interdisciplinary therapy in a comprehensive inpatient rehab  setting. Physiatrist is providing close team supervision and 24 hour management of active medical problems listed below. Physiatrist and rehab team continue to assess barriers to discharge/monitor patient progress toward functional and medical goals.  Function:  Bathing Bathing position   Position: Wheelchair/chair at sink  Bathing parts Body parts bathed by patient: Right arm, Left arm, Chest, Abdomen, Front perineal area, Right upper leg, Left upper leg Body parts bathed by helper: Buttocks, Right lower leg, Left lower leg, Back  Bathing assist Assist Level: (Mod assist)      Upper Body Dressing/Undressing Upper body dressing   What is the patient wearing?: Button up shirt         Button up shirt - Perfomed by patient: Thread/unthread right sleeve, Thread/unthread left sleeve Button up shirt - Perfomed by helper: Pull shirt around back, Button/unbutton shirt    Upper body assist Assist Level: (Mod assist)      Lower Body Dressing/Undressing Lower body dressing   What is the patient wearing?: Pants, Non-skid slipper socks, Shoes   Underwear - Performed by helper: Thread/unthread right underwear leg, Thread/unthread left underwear leg, Pull underwear up/down Pants- Performed by patient: Thread/unthread right pants leg Pants- Performed by helper: Thread/unthread left pants leg, Pull pants up/down Non-skid slipper socks- Performed by patient: Don/doff right sock, Don/doff left sock(doff) Non-skid slipper socks- Performed by helper: Don/doff right  sock, Don/doff left sock(don)       Shoes - Performed by helper: Don/doff right shoe, Fasten right          Lower body assist Assist for lower body dressing: 2 Helpers      Toileting Toileting Toileting activity did not occur: N/A Toileting steps completed by patient: Adjust clothing prior to toileting, Adjust clothing after toileting Toileting steps completed by helper: Performs perineal hygiene, Adjust clothing after  toileting, Adjust clothing prior to toileting [place]: Grab bar or rail, Other (comment)(walker)  Toileting assist Assist level: Two helpers   Transfers Chair/bed transfer   Chair/bed transfer [place]: Stand pivot Chair/bed transfer assist level: Moderate assist (Pt 50 - 74%/lift or lower) Chair/bed transfer assistive device: Walker, Air cabin crew Ambulation activity did not occur: Safety/medical concerns   Max distance: 43ft Assist level: Moderate assist (Pt 50 - 74%)   Wheelchair Wheelchair activity did not occur: Safety/medical concerns Type: Manual Max wheelchair distance: 144ft  Assist Level: Supervision or verbal cues  Cognition Comprehension Comprehension assist level: Follows basic conversation/direction with no assist  Expression Expression assist level: Expresses complex ideas: With no assist  Social Interaction Social Interaction assist level: Interacts appropriately with others with medication or extra time (anti-anxiety, antidepressant).  Problem Solving Problem solving assist level: Solves basic 90% of the time/requires cueing < 10% of the time  Memory Memory assist level: Recognizes or recalls 90% of the time/requires cueing < 10% of the time    Medical Problem List and Plan: 1.  Decreased functional mobility secondary to left subtrochanteric femur fracture, retained hip fusion hardware placed in the 1950s and broken hardware status post ORIF 04/18/2018.  Touchdown weightbearing x8 weeks   Continue CIR  2.  DVT Prophylaxis/Anticoagulation: Subcutaneous Lovenox.     Vascular study negative 3. Pain Management: Oxycodone as needed, frequency decreased on 5/23  Robaxin when necessary started on 5/23   Unclear picture regarding home narcotic regimen   Controlled on 6/4 4. Mood: Provide emotional support 5. Neuropsych: This patient is capable of making decisions on his own behalf. 6. Skin/Wound Care: Routine skin checks  DCed  staples on 5/27 7. Fluids/Electrolytes/Nutrition: Routine in and outs    BMP within acceptable range on 5/21, BUN elevated  Encourage fluids, IVF d/ced 8.  Acute on chronic anemia.  Continue iron supplement   Hemoglobin 9.4 on 6/4   Continue to monitor 9.  Recent fall with left humeral and radial/ulnar fractures. 10.  Type 2 diabetes mellitus.  Hemoglobin A1c 7.6.  SSI.  Check blood sugars before meals and at bedtime (on metformin 1000 twice a day, Amaryl 4 mg daily PTA).   Metformin 500 twice a day started on 5/22, increased to 1000mg  on 5/24   Amaryl 1 mg started on 5/27  CBG (last 3)  Recent Labs    05/08/18 1642 05/08/18 2205 05/09/18 0700  GLUCAP 111* 100* 114*   Relatively controlled on 6/4 11.  Hyperlipidemia.  Pravachol 12.  History of severe aortic stenosis.  Status post bioprosthetic AVR 2012. 13.  History of GI bleed.  Continue Protonix   See #8 14. Hypoalbuminemia   Supplement initiated on 5/21 15. Constipation  Bowel regiment increased on 5/23 16. Orthostatic hypertension  Orthostatic vital signs WNL on 6/1   LOS (Days) 15 A FACE TO FACE EVALUATION WAS PERFORMED  Malli Falotico [name] 05/09/2018 8:25 AM

## 2018-05-10 ENCOUNTER — Inpatient Hospital Stay (HOSPITAL_COMMUNITY): Payer: Medicare Other | Admitting: Occupational Therapy

## 2018-05-10 ENCOUNTER — Inpatient Hospital Stay (HOSPITAL_COMMUNITY): Payer: Medicare Other | Admitting: Physical Therapy

## 2018-05-10 LAB — GLUCOSE, CAPILLARY
GLUCOSE-CAPILLARY: 115 mg/dL — AB (ref 65–99)
Glucose-Capillary: 119 mg/dL — ABNORMAL HIGH (ref 65–99)
Glucose-Capillary: 73 mg/dL (ref 65–99)
Glucose-Capillary: 90 mg/dL (ref 65–99)

## 2018-05-10 MED ORDER — OXYCODONE HCL 5 MG PO TABS
5.0000 mg | ORAL_TABLET | Freq: Three times a day (TID) | ORAL | Status: DC | PRN
  Administered 2018-05-10 – 2018-05-12 (×6): 10 mg via ORAL
  Filled 2018-05-10 (×7): qty 2

## 2018-05-10 NOTE — Progress Notes (Signed)
Social Work Patient ID: Casey Gilmore, male   DOB: 04/10/1941, 77 y.o.   MRN: [id number]  Have informed pt and left VM for son, [name] of bed offers from 3 facilities.  Need pt/fam to make facility choice and await insurance authorization of SNF placement.  Pt aware that he could transfer as early as tomorrow but all dependent on insurance auth at this point.  Will alert team.    Lennart Pall, LCSW

## 2018-05-10 NOTE — Progress Notes (Signed)
Pt only ate 10% of lunch, encouraged him to eat; he refused. He was educated on CBG results and the importance of consumption of meals.

## 2018-05-10 NOTE — Progress Notes (Signed)
[place] PHYSICAL MEDICINE & REHABILITATION     PROGRESS NOTE  Subjective/Complaints:  Pt seen lying in bed this AM.  He slept well overnight.  He is pleasant and appreciative.   ROS: Denies CP, SOB, nausea, vomiting, diarrhea.  Objective: Vital Signs: Blood pressure 107/61, pulse 85, temperature 98.1 F (36.7 C), temperature source Oral, resp. rate 18, weight 78.7 kg (173 lb 8 oz), SpO2 94 %. No results found. Recent Labs    05/09/18 0519  WBC 6.8  HGB 9.4*  HCT 32.5*  PLT 284   Recent Labs    05/08/18 0720  CREATININE 1.08   CBG (last 3)  Recent Labs    05/09/18 1637 05/09/18 2048 05/10/18 0651  GLUCAP 109* 121* 119*    Wt Readings from Last 3 Encounters:  04/24/18 78.7 kg (173 lb 8 oz)  04/22/18 81.3 kg (179 lb 3.7 oz)  01/19/18 88.5 kg (195 lb)    Physical Exam:  BP 107/61 (BP Location: Right Arm)   Pulse 85   Temp 98.1 F (36.7 C) (Oral)   Resp 18   Wt 78.7 kg (173 lb 8 oz)   SpO2 94%   BMI 23.53 kg/m  Constitutional: He appears well-developed and well-nourished. NAD. HENT: Normocephalic and atraumatic.  Eyes: EOM are normal. No discharge.  Cardiovascular: RRR. No JVD.  Respiratory: Effort normal and breath sounds normal.  GI: Bowel sounds are normal. He exhibits no distension.  Musculoskeletal: Left hip edema and tenderness, improving  Neurological: He is alert and oriented.  Motor:  Right lower extremity: Hip flexion, knee extension 4+/5, ankle dorsiflexion 4+/5 (stable) Left lower extremity: Hip flexion, knee extension 1+/5, ankle dorsiflexion 4+/5 (stable) Skin: Surgical site c/d/i Psychiatric: He has a normal mood and affect. His behavior is normal.   Assessment/Plan: 1. Functional deficits secondary to left hip fracture which require 3+ hours per day of interdisciplinary therapy in a comprehensive inpatient rehab setting. Physiatrist is providing close team supervision and 24 hour management of active medical problems listed  below. Physiatrist and rehab team continue to assess barriers to discharge/monitor patient progress toward functional and medical goals.  Function:  Bathing Bathing position   Position: Wheelchair/chair at sink  Bathing parts Body parts bathed by patient: Right arm, Left arm, Chest, Abdomen, Front perineal area, Right upper leg, Left upper leg Body parts bathed by helper: Buttocks, Right lower leg, Left lower leg, Back  Bathing assist Assist Level: (Mod assist)      Upper Body Dressing/Undressing Upper body dressing   What is the patient wearing?: Button up shirt         Button up shirt - Perfomed by patient: Thread/unthread right sleeve, Thread/unthread left sleeve, Button/unbutton shirt Button up shirt - Perfomed by helper: Pull shirt around back    Upper body assist Assist Level: Assistive device, Touching or steadying assistance(Pt > 75%) Assistive Device Comment: button hook    Lower Body Dressing/Undressing Lower body dressing   What is the patient wearing?: Pants, Shoes   Underwear - Performed by helper: Thread/unthread right underwear leg, Thread/unthread left underwear leg, Pull underwear up/down Pants- Performed by patient: Thread/unthread right pants leg, Pull pants up/down Pants- Performed by helper: Thread/unthread left pants leg Non-skid slipper socks- Performed by patient: Don/doff right sock, Don/doff left sock(doff) Non-skid slipper socks- Performed by helper: Don/doff right sock, Don/doff left sock(don)     Shoes - Performed by patient: Don/doff right shoe Shoes - Performed by helper: Don/doff left shoe  Lower body assist Assist for lower body dressing: 2 Helpers(Mod assist, 2nd person present for safety)      Toileting Toileting Toileting activity did not occur: N/A Toileting steps completed by patient: Adjust clothing prior to toileting, Adjust clothing after toileting Toileting steps completed by helper: Performs perineal hygiene, Adjust  clothing after toileting, Adjust clothing prior to toileting [place]: Grab bar or rail, Other (comment)(walker)  Toileting assist Assist level: Two helpers   Transfers Chair/bed transfer   Chair/bed transfer [place]: Stand pivot Chair/bed transfer assist level: Moderate assist (Pt 50 - 74%/lift or lower) Chair/bed transfer assistive device: Walker, Air cabin crew Ambulation activity did not occur: Safety/medical concerns   Max distance: 37ft Assist level: Moderate assist (Pt 50 - 74%)   Wheelchair Wheelchair activity did not occur: Safety/medical concerns Type: Manual Max wheelchair distance: 148ft  Assist Level: Supervision or verbal cues  Cognition Comprehension Comprehension assist level: Follows basic conversation/direction with no assist  Expression Expression assist level: Expresses complex ideas: With no assist  Social Interaction Social Interaction assist level: Interacts appropriately with others with medication or extra time (anti-anxiety, antidepressant).  Problem Solving Problem solving assist level: Solves basic 90% of the time/requires cueing < 10% of the time  Memory Memory assist level: Recognizes or recalls 90% of the time/requires cueing < 10% of the time    Medical Problem List and Plan: 1.  Decreased functional mobility secondary to left subtrochanteric femur fracture, retained hip fusion hardware placed in the 1950s and broken hardware status post ORIF 04/18/2018.  Touchdown weightbearing x8 weeks   Continue CIR  2.  DVT Prophylaxis/Anticoagulation: Subcutaneous Lovenox.     Vascular study negative 3. Pain Management: Oxycodone as needed, frequency decreased on 5/23, Decreased again on 6/5  Robaxin when necessary started on 5/23   Unclear picture regarding home narcotic regimen 4. Mood: Provide emotional support 5. Neuropsych: This patient is capable of making decisions on his own behalf. 6. Skin/Wound Care: Routine  skin checks  DCed staples on 5/27 7. Fluids/Electrolytes/Nutrition: Routine in and outs    BMP within acceptable range on 5/21, BUN elevated  Encourage fluids, IVF d/ced 8.  Acute on chronic anemia.  Continue iron supplement   Hemoglobin 9.4 on 6/4   Continue to monitor 9.  Recent fall with left humeral and radial/ulnar fractures. 10.  Type 2 diabetes mellitus.  Hemoglobin A1c 7.6.  SSI.  Check blood sugars before meals and at bedtime (on metformin 1000 twice a day, Amaryl 4 mg daily PTA).   Metformin 500 twice a day started on 5/22, increased to 1000mg  on 5/24   Amaryl 1 mg started on 5/27  CBG (last 3)  Recent Labs    05/09/18 1637 05/09/18 2048 05/10/18 0651  GLUCAP 109* 121* 119*   Relatively controlled on 6/5 11.  Hyperlipidemia.  Pravachol 12.  History of severe aortic stenosis.  Status post bioprosthetic AVR 2012. 13.  History of GI bleed.  Continue Protonix   See #8 14. Hypoalbuminemia   Supplement initiated on 5/21 15. 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

## 2018-05-10 NOTE — Progress Notes (Signed)
Physical Therapy Session Note  Patient Details  Name: Casey Gilmore MRN: [id number] Date of Birth: 1941-08-15  Today's Date: 05/10/2018 PT Individual Time: 0800-0915 AND 1045-1200 PT Individual Time Calculation (min): 75 min AND 75 min   Short Term Goals: Week 2:  PT Short Term Goal 1 (Week 2): Pt will perform bed mobility with mod assist consistently  PT Short Term Goal 2 (Week 2): Pt will ambuate 43f with mod-max assist and LRAD  PT Short Term Goal 3 (Week 2): Pt will propell WC 153fwith supervision assist consistently  PT Short Term Goal 4 (Week 2): PT will perform sit<>stand with mod assist of 1 consistently   Skilled Therapeutic Interventions/Progress Updates:  Pt received supine in bed and agreeable to PT. Supine>sit transfer with mod assist at trunk.   Sit<>stand transfers with mod assist from elevated bed and max assist from PT and arm chair. Moderate cues from PT for proper set up, safety, and improved anterior weight shift over th RLE. completed x 6 throughout treatment.   WC mobility x75 ft with supervision assist from PT for safety   Gait training with RW x 2039fith Mod assist as well as min cues for proper PR through LLE.   UE therex:  UBE 3 min at level 5.  Chest press 3# bar weight x15 Shoulder press 3# bar weight x25 Low/mid row  Level 2 band. X 20   Patient returned to room and left sitting in WC [facility] call bell in reach and all needs met.     Session 2.   Pt received sitting in WC and agreeable to PT  PT transported pt [facility] rehab gym [facility].   Seated UE and LE tricep press upstherex chest press shoulder press, attempted low row. Pt in significant pain on this day and unable to sit unsupported for >30 sec to complete therex. . LMarland KitchenQ with stabilization of femur from PT x10  Bed mobility with min assist and increased time for PT to manage the LLE. Min cues for decreased force through the LLE with pivot into sitting.   Supine  therex. SAQ x 10, ankle PF/DF. Hip adduction/abduction within available range. X 10.  3# bar weight: Chest press, pull down with added level 2 tband, bicep curl,   Gait training with RW x 5ft63fod assist and moderate cues for gait pattern/AD management.   Sit<>stand with mod assist from elevated mat table and max assist from WC. [facility] noted to have improved anterior weight shift over the RLE from bed than from PT.     Patient returned to room and [facility] in reach and all needs met.           Therapy Documentation Precautions:  Precautions Precautions: Fall Restrictions Weight Bearing Restrictions: Yes LLE Weight Bearing: Touchdown weight bearing Other Position/Activity Restrictions: No ROM restrictions noted in note w/ hip, however pt reports pt has been limited to ~45 deg of L hip flexion since a hip surgery he had in the 1950s    Vital Signs: Therapy Vitals Pulse Rate: 85 Resp: 18 BP: 107/61 Patient Position (if appropriate): Lying Oxygen Therapy SpO2: 94 % O2 Device: Room Air Pain: Pain Assessment Pain Scale: 0-10 Pain Score:5 Pain Intervention(s): Medication (See eMAR)   See Function Navigator for Current Functional Status.   Therapy/Group: Individual Therapy  AustLorie Phenix/2019, 9:23 AM

## 2018-05-10 NOTE — Progress Notes (Signed)
Occupational Therapy Weekly Progress Note  Patient Details  Name: Casey Gilmore MRN: [id number] Date of Birth: 1941/11/09  Beginning of progress report period: May 02, 2018 End of progress report period: May 10, 2018  Today's Date: 05/10/2018 OT Individual Time: 1300-1400 OT Individual Time Calculation (min): 60 min    Patient has met 3 of 3 short term goals.  Pt is making steady progress towards goals.  Pt has demonstrated great improvement with pain tolerance during sit > stand and stand pivot transfers.  Pt is able to complete sit > stand with mod assist and stand pivot transfers with increased time due to pain in LLE and hesitancy during touch down due to fearfulness of pain. Have begun to decrease +2 assistance as pt does not physically require 2nd person assist, however he is very fearful during mobility and often requests 2nd person for reassurance.  Pt is demonstrating good knowledge of AE to decrease burden of care with LB bathing and dressing.    Patient continues to demonstrate the following deficits: muscle weakness,decreased initiation and decreased memoryand decreased sitting balance, decreased standing balance, decreased postural control and decreased balance strategies and therefore will continue to benefit from skilled OT intervention to enhance overall performance with BADL and Reduce care partner burden.  Patient progressing toward long term goals..  Continue plan of care.  OT Short Term Goals Week 2:  OT Short Term Goal 1 (Week 2): Pt will complete sit > stand with mod assist of 1 caregiver in preparation to complete LB dressing at sit > stand level OT Short Term Goal 1 - Progress (Week 2): Met OT Short Term Goal 2 (Week 2): Pt will complete toilet transfer with mod assist of 1 caregiver OT Short Term Goal 2 - Progress (Week 2): Met OT Short Term Goal 3 (Week 2): Pt will complete LB dressing with max assist and use of AE as needed OT Short Term Goal 3 - Progress (Week  2): Met Week 3:  OT Short Term Goal 1 (Week 3): Pt will complete bathing with min assist with use of AE OT Short Term Goal 2 (Week 3): Pt will complete LB dressing with min assist with use of AE OT Short Term Goal 3 (Week 3): Pt will complete sit > stand with min assist of 1 caregiver for LB dressing and transfers OT Short Term Goal 4 (Week 3): Pt will complete toilet transfer with min assist   Skilled Therapeutic Interventions/Progress Updates:    Treatment session with focus on BUE strengthening and decreased burden of care with transfers.  Pt received upright in w/c reporting extreme pain in Lt hip and declining any standing at this time as he had just received pain meds.  Engaged in [place] with 3# dowel rod, 3 sets of 10 chest presses, 1 set of 10 overhead presses, and 2 sets of 10 rows.  Engaged in discussion regarding d/c plan and appreciative of progress to this point.  Completed sit > stand with max assist of 1 caregiver with cues for anterior weight shift, min assist with mod verbal cues for stand pivot to EOB with RW.  Pt continues to require increased time and cues, however has demonstrated improvement with activity tolerance and mobility - when pain is in manageable range.  Pt returned to supine in bed with pt managing UB and therapist assisting with legs.  Pt left supine in bed with all needs in reach.  Therapy Documentation Precautions:  Precautions Precautions: Fall Restrictions Weight  Bearing Restrictions: Yes LLE Weight Bearing: Touchdown weight bearing Other Position/Activity Restrictions: No ROM restrictions noted in note w/ hip, however pt reports pt has been limited to ~45 deg of L hip flexion since a hip surgery he had in the 1950s General:   Vital Signs: Therapy Vitals Temp: 98.1 F (36.7 C) Temp Source: Oral Pulse Rate: 74 Resp: 18 BP: 112/65 Patient Position (if appropriate): Lying Oxygen Therapy SpO2: 97 % O2 Device: Room Air Pain: Pain  Assessment Pain Scale: 0-10 Pain Score: 4  Pain Intervention(s): Medication (See eMAR)  See Function Navigator for Current Functional Status.   Therapy/Group: Individual Therapy  [name] 05/10/2018, 7:51 AM

## 2018-05-10 NOTE — Plan of Care (Signed)
  Problem: RH SAFETY Goal: RH STG ADHERE TO SAFETY PRECAUTIONS W/ASSISTANCE/DEVICE Description STG Adhere to Safety Precautions With Mod.Assistance/Device.  Outcome: Progressing  Call light within reach, appropriate footwear.

## 2018-05-11 ENCOUNTER — Inpatient Hospital Stay (HOSPITAL_COMMUNITY): Payer: Medicare Other | Admitting: Physical Therapy

## 2018-05-11 ENCOUNTER — Inpatient Hospital Stay (HOSPITAL_COMMUNITY): Payer: Medicare Other | Admitting: Occupational Therapy

## 2018-05-11 LAB — GLUCOSE, CAPILLARY
GLUCOSE-CAPILLARY: 91 mg/dL (ref 65–99)
GLUCOSE-CAPILLARY: 102 mg/dL — AB (ref 65–99)
Glucose-Capillary: 125 mg/dL — ABNORMAL HIGH (ref 65–99)
Glucose-Capillary: 104 mg/dL — ABNORMAL HIGH (ref 65–99)

## 2018-05-11 MED ORDER — PANTOPRAZOLE SODIUM 40 MG PO TBEC: 40.0000 mg | DELAYED_RELEASE_TABLET | Freq: Every day | ORAL | Status: AC

## 2018-05-11 MED ORDER — LIDOCAINE 5 % EX PTCH: 1.0000 | MEDICATED_PATCH | CUTANEOUS | 0 refills | Status: DC

## 2018-05-11 MED ORDER — OXYCODONE HCL 5 MG PO TABS: 5.0000 mg | ORAL_TABLET | Freq: Three times a day (TID) | ORAL | 0 refills | Status: DC | PRN

## 2018-05-11 MED ORDER — METHOCARBAMOL 500 MG PO TABS: 500.0000 mg | ORAL_TABLET | Freq: Four times a day (QID) | ORAL | 0 refills | Status: AC | PRN

## 2018-05-11 MED ORDER — LIDOCAINE 5 % EX PTCH
1.0000 | MEDICATED_PATCH | CUTANEOUS | Status: DC
  Administered 2018-05-11 – 2018-05-12 (×2): 1 via TRANSDERMAL
  Filled 2018-05-11 (×2): qty 1

## 2018-05-11 MED ORDER — GLIMEPIRIDE 1 MG PO TABS: 1.0000 mg | ORAL_TABLET | Freq: Every day | ORAL | Status: DC

## 2018-05-11 MED ORDER — POLYETHYLENE GLYCOL 3350 17 G PO PACK: 17.0000 g | PACK | Freq: Two times a day (BID) | ORAL | 0 refills | Status: DC

## 2018-05-11 MED ORDER — VITAMIN D (ERGOCALCIFEROL) 1.25 MG (50000 UNIT) PO CAPS: 50000.0000 [IU] | ORAL_CAPSULE | ORAL | Status: DC

## 2018-05-11 NOTE — Progress Notes (Signed)
Social Work Patient ID: Casey Gilmore, male   DOB: 02-Oct-1941, 77 y.o.   MRN: [id number]   Have received SNF auth from insurance with plan to move pt to [facility].  Have alerted pt, son and tx team and all agreeable.  Palma Buster, LCSW

## 2018-05-11 NOTE — Progress Notes (Signed)
[name] PHYSICAL MEDICINE & REHABILITATION     PROGRESS NOTE  Subjective/Complaints:  Patient seen lying in bed this morning. He states he slept well overnight. He asks who reduced his pain medications.  ROS: denies CP, SOB, nausea, vomiting, diarrhea.  Objective: Vital Signs: Blood pressure 118/63, pulse 76, temperature 98.6 F (37 C), temperature source Oral, resp. rate 18, weight 78.7 kg (173 lb 8 oz), SpO2 97 %. No results found. Recent Labs    05/09/18 0519  WBC 6.8  HGB 9.4*  HCT 32.5*  PLT 284   No results for input(s): NA, K, CL, GLUCOSE, BUN, CREATININE, CALCIUM in the last 72 hours.  Invalid input(s): CO CBG (last 3)  Recent Labs    05/10/18 1633 05/10/18 2107 05/11/18 0638  GLUCAP 90 115* 125*    Wt Readings from Last 3 Encounters:  04/24/18 78.7 kg (173 lb 8 oz)  04/22/18 81.3 kg (179 lb 3.7 oz)  01/19/18 88.5 kg (195 lb)    Physical Exam:  BP 118/63 (BP Location: Left Arm)   Pulse 76   Temp 98.6 F (37 C) (Oral)   Resp 18   Wt 78.7 kg (173 lb 8 oz)   SpO2 97%   BMI 23.53 kg/m  Constitutional: He appears well-developed and well-nourished. NAD. HENT: Normocephalic and atraumatic.  Eyes: EOM are normal. No discharge.  Cardiovascular: RRR. No JVD.  Respiratory: Effort normal and breath sounds normal.  GI: Bowel sounds are normal. He exhibits no distension.  Musculoskeletal: Left hip edema and tenderness, continues to improve  Neurological: He is alert and oriented.  Motor:  Right lower extremity: Hip flexion, knee extension 4+/5, ankle dorsiflexion 4+/5 (stable) Left lower extremity: Hip flexion, knee extension 1+/5, ankle dorsiflexion 4+/5 (unchanged) Skin: Surgical site remains c/d/i Psychiatric: He has a normal mood and affect. His behavior is normal.   Assessment/Plan: 1. Functional deficits secondary to left hip fracture which require 3+ hours per day of interdisciplinary therapy in a comprehensive inpatient rehab setting. Physiatrist  is providing close team supervision and 24 hour management of active medical problems listed below. Physiatrist and rehab team continue to assess barriers to discharge/monitor patient progress toward functional and medical goals.  Function:  Bathing Bathing position   Position: Wheelchair/chair at sink  Bathing parts Body parts bathed by patient: Right arm, Left arm, Chest, Abdomen, Front perineal area, Right upper leg, Left upper leg Body parts bathed by helper: Buttocks, Right lower leg, Left lower leg, Back  Bathing assist Assist Level: (Mod assist)      Upper Body Dressing/Undressing Upper body dressing   What is the patient wearing?: Button up shirt         Button up shirt - Perfomed by patient: Thread/unthread right sleeve, Thread/unthread left sleeve, Button/unbutton shirt Button up shirt - Perfomed by helper: Pull shirt around back    Upper body assist Assist Level: Assistive device, Touching or steadying assistance(Pt > 75%) Assistive Device Comment: button hook    Lower Body Dressing/Undressing Lower body dressing   What is the patient wearing?: Pants, Shoes   Underwear - Performed by helper: Thread/unthread right underwear leg, Thread/unthread left underwear leg, Pull underwear up/down Pants- Performed by patient: Thread/unthread right pants leg, Pull pants up/down Pants- Performed by helper: Thread/unthread left pants leg Non-skid slipper socks- Performed by patient: Don/doff right sock, Don/doff left sock(doff) Non-skid slipper socks- Performed by helper: Don/doff right sock, Don/doff left sock(don)     Shoes - Performed by patient: Don/doff right shoe Shoes -  Performed by helper: Don/doff left shoe          Lower body assist Assist for lower body dressing: 2 Helpers(Mod assist, 2nd person present for safety)      Toileting Toileting Toileting activity did not occur: N/A Toileting steps completed by patient: Adjust clothing prior to toileting, Adjust  clothing after toileting Toileting steps completed by helper: Performs perineal hygiene, Adjust clothing after toileting, Adjust clothing prior to toileting [place]: Grab bar or rail, Other (comment)(walker)  Toileting assist Assist level: Two helpers   Transfers Chair/bed transfer   Chair/bed transfer method: Stand pivot Chair/bed transfer assist level: Maximal assist (Pt 25 - 49%/lift and lower) Chair/bed transfer assistive device: Armrests, Walker     Locomotion Ambulation Ambulation activity did not occur: Safety/medical concerns   Max distance: 32ft  Assist level: Moderate assist (Pt 50 - 74%)   Wheelchair Wheelchair activity did not occur: Safety/medical concerns Type: Manual Max wheelchair distance: 83ft Assist Level: Supervision or verbal cues  Cognition Comprehension Comprehension assist level: Understands complex 90% of the time/cues 10% of the time  Expression Expression assist level: Expresses complex ideas: With no assist  Social Interaction Social Interaction assist level: Interacts appropriately with others with medication or extra time (anti-anxiety, antidepressant).  Problem Solving Problem solving assist level: Solves basic 90% of the time/requires cueing < 10% of the time  Memory Memory assist level: Recognizes or recalls 90% of the time/requires cueing < 10% of the time    Medical Problem List and Plan: 1.  Decreased functional mobility secondary to left subtrochanteric femur fracture, retained hip fusion hardware placed in the 1950s and broken hardware status post ORIF 04/18/2018.  Touchdown weightbearing x8 weeks   Continue CIR, plan for DC to SNF in near future 2.  DVT Prophylaxis/Anticoagulation: Subcutaneous Lovenox.     Vascular study negative 3. Pain Management: Oxycodone as needed, frequency decreased on 5/23, Decreased again on 6/5  Robaxin when necessary started on 5/23   Unclear picture regarding home narcotic regimen  Lidoderm  patch added on 6/6 4. Mood: Provide emotional support 5. Neuropsych: This patient is capable of making decisions on his own behalf. 6. Skin/Wound Care: Routine skin checks  DCed staples on 5/27 7. Fluids/Electrolytes/Nutrition: Routine in and outs    BMP within acceptable range on 5/21, BUN elevated  Encourage fluids, IVF d/ced 8.  Acute on chronic anemia.  Continue iron supplement   Hemoglobin 9.4 on 6/4   Continue to monitor 9.  Recent fall with left humeral and radial/ulnar fractures. 10.  Type 2 diabetes mellitus.  Hemoglobin A1c 7.6.  SSI.  Check blood sugars before meals and at bedtime (on metformin 1000 twice a day, Amaryl 4 mg daily PTA).   Metformin 500 twice a day started on 5/22, increased to 1000mg  on 5/24   Amaryl 1 mg started on 5/27  CBG (last 3)  Recent Labs    05/10/18 1633 05/10/18 2107 05/11/18 0638  GLUCAP 90 115* 125*   Relatively controlled on 6/6 11.  Hyperlipidemia.  Pravachol 12.  History of severe aortic stenosis.  Status post bioprosthetic AVR 2012. 13.  History of GI bleed.  Continue Protonix   See #8 14. Hypoalbuminemia   Supplement initiated on 5/21 15. Constipation  Bowel regiment increased on 5/23 16. Orthostatic hypertension  Orthostatic vital signs WNL on 6/1   LOS (Days) 17 A FACE TO FACE EVALUATION WAS PERFORMED  Ankit [name] 05/11/2018 8:22 AM

## 2018-05-11 NOTE — Patient Care Conference (Signed)
Inpatient RehabilitationTeam Conference and Plan of Care Update Date: 05/10/2018   Time: 2:30 PM    Patient Name: Casey Gilmore      Medical Record Number: [id number]  Date of Birth: 1941/05/19 Sex: Male         Room/Bed: 4M04C/4M04C-01 Payor Info: Payor: [employer]UE [employer] MEDICARE / Plan: BCBS MEDICARE / Product Type: *No Product type* /    Admitting Diagnosis: L hip FX  Admit Date/Time:  04/24/2018  3:45 PM Admission Comments: No comment available   Primary Diagnosis:  <principal problem not specified> Principal Problem: <principal problem not specified>  Patient Active Problem List   Diagnosis Date Noted  . Orthostatic hypotension   . Chronic pain syndrome   . Constipation due to pain medication   . Postoperative pain   . Hypoalbuminemia due to protein-calorie malnutrition ([place])   . Closed left subtrochanteric femur fracture ([place]) 04/24/2018  . Acute blood loss anemia   . Anemia of chronic disease   . Diabetes mellitus type 2 in nonobese ([facility])   . Hyperlipidemia   . Aortic valve stenosis   . History of GI bleed   . Anemia 04/17/2018  . Closed left hip fracture ([place]) 04/15/2018  . Closed fracture of left proximal humerus 01/19/2018  . Closed fracture of left distal radius and ulna 01/19/2018  . 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 COPD (chronic obstructive pulmonary disease) with chronic bronchitis ([place]) 10/29/2014  . Chronic diastolic heart failure ([place]) 06/24/2014  . Benign essential HTN 06/24/2014  . 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 History of non-ST elevation myocardial infarction (NSTEMI) 06/24/2014  . Diabetes mellitus ([place]) 10/17/2011  . Osteoarthritis 10/17/2011    Expected Discharge Date: Expected Discharge Date: (SNF)  Team Members Present: Physician leading conference: Dr. Delice Lesch Social Worker Present: Lennart Pall, LCSW Nurse Present: Frances Maywood, RN PT Present: Barrie Folk, PT OT Present: [name], OT PPS  Coordinator present : Daiva Nakayama, RN, CRRN     Current Status/Progress Goal Weekly Team Focus  Medical   Decreased functional mobility secondary to left subtrochanteric femur fracture, retained hip fusion hardware placed in the 1950s and broken hardware status post ORIF 04/18/2018  Improve mobility, transfers, pain, elevated BUN, CBGs  See above   Bowel/Bladder   Continent of bowel and bladder; LBM 6/5  Mod I assist  Assess and treat for constipation as needed   Swallow/Nutrition/ Hydration   Encourage adequate fluid intake         ADL's   mod assist bathing and LB dressing, min assist UB dressing, continues to require +2 assistance with sit > stand due to pt fearfulness when standing and during stand pivot transfers  Min assist overall with AE  ADL retraining, sit > stand, use of AE for bathing and dressing, stand pivot transfers, pain management   Mobility   min-mod assist for bed mobility including sit<>supine. sit<>stnad transfers with mod assist form elevated surfaces,   min assist overall for bed mobility, min assist trasnfers. min assist ambulation for house hold distance with RW. supervision assist WC mobility at household level.    improved safety with transfers, gait and improved endurance    Communication             Safety/Cognition/ Behavioral Observations            Pain   C/o pain in hip and in lower back; Lidocaine patch added today; oxycodone every 8 hours prn, tylenol every 6 hours prn,  robaxin every 6 hours prn  < 5  Assess and treat for pain q shift and prn   Skin   surgical incision to left hip healing, CDI  Min assist  Assess skin q shift and prn      *See Care Plan and progress notes for long and short-term goals.     Barriers to Discharge  Current Status/Progress Possible Resolutions Date Resolved   Physician    Medical stability;Decreased caregiver support;Inaccessible home environment;Home environment access/layout;Lack of/limited family support     See  above  Therapies, follow labs, optimize DM meds, wean pain meds      Nursing                  PT                    OT                  SLP                SW                Discharge Planning/Teaching Needs:  dc plan changed to SNF      Team Discussion:  MD continues to work on decreasing pain med doses.  Medically stable.  Still mod-max assist overall and mod assist amb 20'.  SW reports change of d/c plan to SNF  - has received bed offers and awaiting insurance approval.  Revisions to Treatment Plan:  Change of d/c plan to SNF.    Continued Need for Acute Rehabilitation Level of Care: The patient requires daily medical management by a physician with specialized training in physical medicine and rehabilitation for the following conditions: Daily direction of a multidisciplinary physical rehabilitation program to ensure safe treatment while eliciting the highest outcome that is of practical value to the patient.: Yes Daily medical management of patient stability for increased activity during participation in an intensive rehabilitation regime.: Yes Daily analysis of laboratory values and/or radiology reports with any subsequent need for medication adjustment of medical intervention for : Post surgical problems;Diabetes problems;Other;Renal problems  Hector Venne 05/11/2018, 3:38 PM

## 2018-05-11 NOTE — Progress Notes (Signed)
Physical Therapy Discharge Summary  Patient Details  Name: Casey Gilmore MRN: [id number] Date of Birth: December 14, 1940  Today's Date: 05/11/2018 PT Individual Time: 1300-1330 PT Individual Time Calculation (min): 30 min    Patient has met 3 of 7 long term goals due to improved activity tolerance, improved balance, improved postural control, increased strength, increased range of motion, decreased pain and ability to compensate for deficits.  Patient to discharge at a wheelchair level [place].   Patient's care partner unavailable to provide the necessary physical assistance at discharge.  Reasons goals not met:  change in d/c plan to SNF   Recommendation:  Patient will benefit from ongoing skilled PT services in skilled nursing facility setting to continue to advance safe functional mobility, address ongoing impairments in balance, safety, transfers,  Gait, bed mobility, and minimize fall risk.  Equipment: No equipment provided  Reasons for discharge: discharge from hospital  Patient/family agrees with progress made and goals achieved: Yes  PT Discharge PT instructed pt in Grad day assessment to measure progress toward goals. See below for details.    Pain Pain Assessment Pain Scale: 0-10 Pain Score: 3  Pain Type: Acute pain Pain Location: Hip Pain Orientation: Left Pain Descriptors / Indicators: Aching Pain Onset: With Activity Patients Stated Pain Goal: 0 Pain Intervention(s): Medication (See eMAR)  Cognition Overall Cognitive Status: No family/caregiver present to determine baseline cognitive functioning Arousal/Alertness: Awake/alert Orientation Level: Oriented X4 Memory: Impaired Memory Impairment: Retrieval deficit;Decreased recall of new information Awareness: Appears intact Problem Solving: Appears intact Safety/Judgment: Appears intact Sensation Sensation Light Touch: Appears Intact Proprioception: Appears Intact Coordination Gross Motor Movements are  Fluid and Coordinated: No Coordination and Movement Description: Gross motor movements limited in LLE 2/2 recent hip surgery and chronic/lifelong ROM deficits Finger Nose Finger Test: impaired on LUE due to decreased shoulder ROM from prior fx Motor  Motor Motor: Within Functional Limits  Mobility Bed Mobility Bed Mobility: Rolling Right;Supine to Sit;Sit to Supine Rolling Right: Minimal Assistance - Patient > 75% Rolling Right Details: Manual facilitation for placement;Manual facilitation for weight shifting;Verbal cues for technique Supine to Sit: Minimal Assistance - Patient > 75% Supine to Sit Details: Verbal cues for technique;Verbal cues for precautions/safety;Manual facilitation for weight shifting Sit to Supine: Minimal Assistance - Patient > 75% Transfers Transfers: Stand Pivot Transfers Sit to Stand: Moderate Assistance - Patient 50-74% Stand to Sit: Moderate Assistance - Patient 50-74% Lateral/Scoot Transfers: Moderate Assistance - Patient 50-74% Lateral/Scoot Transfer Details: Verbal cues for technique;Verbal cues for precautions/safety;Verbal cues for safe use of DME/AE;Verbal cues for sequencing;Manual facilitation for weight shifting;Manual facilitation for placement Transfer (Assistive device): Rolling walker Locomotion  Gait Ambulation: Yes Gait Assistance: Moderate Assistance - Patient 50-74% Ambulation Distance (Feet): 25 Feet Assistive device: Rolling walker Gait Gait: Yes Gait Pattern: Impaired Gait Pattern: Step-to pattern Stairs / Additional Locomotion Stairs: Yes Stair Management Technique: Two rails Number of Stairs: 1 Height of Stairs: 2 Wheelchair Mobility Wheelchair Mobility: Yes Wheelchair Propulsion: Both upper extremities Wheelchair Parts Management: Needs assistance Distance: 150 supervision assist   Trunk/Postural Assessment  Cervical Assessment Cervical Assessment: Within Functional Limits Thoracic Assessment Thoracic Assessment: Within  Functional Limits Lumbar Assessment Lumbar Assessment: Within Functional Limits Postural Control Postural Control: Deficits on evaluation(impaired 2/2 decreased L hip flexion, posterior and R lateral lean bias)  Balance Balance Balance Assessed: Yes Static Sitting Balance Static Sitting - Balance Support: Feet supported;Right upper extremity supported Static Sitting - Level of Assistance: 5: Stand by assistance Dynamic Sitting Balance Dynamic Sitting -  Balance Support: Bilateral upper extremity supported Dynamic Sitting - Level of Assistance: 5: Stand by assistance Static Standing Balance Static Standing - Balance Support: Bilateral upper extremity supported Static Standing - Level of Assistance: 4: Min assist Dynamic Standing Balance Dynamic Standing - Balance Support: Bilateral upper extremity supported Dynamic Standing - Level of Assistance: 3: Mod assist Extremity Assessment      RLE Assessment RLE Assessment: Within Functional Limits LLE Assessment LLE Assessment: Exceptions to WFL(Hip flexion 0-45 deg, pt reports this is chronic from hip surgery he had when he was young, pt able to briefly move extremity against gravity, however difficult to assess strength and knee/ankle ROM 2/2 pain)   See Function Navigator for Current Functional Status.  [name] 05/11/2018, 1:33 PM

## 2018-05-11 NOTE — Progress Notes (Signed)
Physical Therapy Session Note  Patient Details  Name: Casey Gilmore MRN: [id number] Date of Birth: 09-20-1941  Today's Date: 05/11/2018 PT Individual Time: 0900-1000 AND 1300-1330 PT Individual Time Calculation (min): 60 min AND 30 min   Short Term Goals: Week 1:  PT Short Term Goal 1 (Week 1): Pt will initiate gait training w/ LRAD PT Short Term Goal 1 - Progress (Week 1): Partly met PT Short Term Goal 2 (Week 1): Pt will transfer bed<>chair w/ mod assist PT Short Term Goal 2 - Progress (Week 1): Progressing toward goal PT Short Term Goal 3 (Week 1): Pt will initiate w/c self-propulsion training PT Short Term Goal 3 - Progress (Week 1): Met PT Short Term Goal 4 (Week 1): Pt will perform bed mobility w/ mod assist PT Short Term Goal 4 - Progress (Week 1): Progressing toward goal Week 2:  PT Short Term Goal 1 (Week 2): Pt will perform bed mobility with mod assist consistently  PT Short Term Goal 2 (Week 2): Pt will ambuate 1f with mod-max assist and LRAD  PT Short Term Goal 3 (Week 2): Pt will propell WC 1585fwith supervision assist consistently  PT Short Term Goal 4 (Week 2): PT will perform sit<>stand with mod assist of 1 consistently   Skilled Therapeutic Interventions/Progress Updates:   Pt received supine in bed and agreeable to PT. Supine>sit transfer with mod assist and min cues for safety.    [facility] and mod assist. Pt also completed sit<>stand trasnfers with mod assist fom various surfaces including bed, WC, and mat table.   Gait training x 2558fith min-mod assist from PT and cues for proper use of BUE    Stair negotiation training x 1( 2") step with mod assist from PT in parallel bars.   WC mobility x [id number] supervision assist from PT and 1 short rest break. Cues from PT for seating position to improve use of BUE equally.   Patient returned to room and [name] and all needs met.    Session  2.   Pt received sitting in WC and agreeable to PT  PT transported to pt to rehab gym. Sit<>[name] due to poor RLE positioning. Sit<>supine to flat bed with min assist from PT to manage the LLE and extended time. Pt required prolonged rest break following bed mobility due to LLE pain . Stand pivot transfer to [name] and min cues for AD management and safety. Patient returned to room and [name] and all needs met.         Therapy Documentation Precautions:  Precautions Precautions: Fall Restrictions Weight Bearing Restrictions: Yes LLE Weight Bearing: Touchdown weight bearing Other Position/Activity Restrictions: No ROM restrictions noted in note w/ hip, however pt reports pt has been limited to ~45 deg of L hip flexion since a hip surgery he had in the 1950s Pain: Pain Assessment Pain Scale: 0-10 Pain Score: 4  Pain Type: Acute pain Pain Location: Hip Pain Orientation: Left Pain Descriptors / Indicators: Aching;Sharp;Shooting Pain Onset: With Activity Patients Stated Pain Goal: 0 Pain Intervention(s): Repositioned  See Function Navigator for Current Functional Status.   Therapy/Group: Individual Therapy  AustLorie Phenix/2019, 10:05 AM

## 2018-05-11 NOTE — Progress Notes (Signed)
Occupational Therapy Discharge Summary  Patient Details  Name: Casey Gilmore MRN: [id number] Date of Birth: 1941/03/30  Patient has met 4 of 8 long term goals due to improved activity tolerance, improved balance, postural control and ability to compensate for deficits.  Patient to discharge at overall Mod Assist level.  Patient's care partner unavailable to provide the necessary physical assistance at discharge.    Reasons goals not met: Pt continues to require mod-max assist with toilet transfers, mod assist with toileting, min assist with UB dressing, and mod assist with LB dressing with use of AE.    Recommendation:  Patient will benefit from ongoing skilled OT services in skilled nursing facility setting to continue to advance functional skills in the area of BADL and Reduce care partner burden.  Equipment: No equipment provided  Reasons for discharge: discharge from hospital  Patient/family agrees with progress made and goals achieved: Yes  OT Discharge Precautions/Restrictions  Precautions Precautions: Fall Restrictions Weight Bearing Restrictions: Yes LLE Weight Bearing: Touchdown weight bearing Pain Pain Assessment Pain Scale: 0-10 Pain Score: 3  Pain Type: Acute pain Pain Location: Hip Pain Orientation: Left Pain Descriptors / Indicators: Aching Pain Onset: With Activity Patients Stated Pain Goal: 0 Pain Intervention(s): Medication (See eMAR) ADL  See Function Navigator Vision Baseline Vision/History: Wears glasses Wears Glasses: Reading only Patient Visual Report: No change from baseline Vision Assessment?: No apparent visual deficits Cognition Overall Cognitive Status: No family/caregiver present to determine baseline cognitive functioning Arousal/Alertness: Awake/alert Orientation Level: Oriented X4 Memory: Impaired Memory Impairment: Retrieval deficit;Decreased recall of new information Awareness: Appears intact Problem Solving: Appears  intact Safety/Judgment: Appears intact Sensation Sensation Light Touch: Appears Intact Proprioception: Appears Intact Coordination Gross Motor Movements are Fluid and Coordinated: No Coordination and Movement Description: Gross motor movements limited in LLE 2/2 recent hip surgery and chronic/lifelong ROM deficits Finger Nose Finger Test: impaired on LUE due to decreased shoulder ROM from prior fx Extremity/Trunk Assessment RUE Assessment RUE Assessment: Within Functional Limits LUE Assessment LUE Assessment: Exceptions to WFL(shoulder flexion limited to ~80* due to prior fx, elbow WFL, wrist fixed due to fx) LUE Body System: Ortho   See Function Navigator for Current Functional Status.  [name] 05/11/2018, 3:55 PM

## 2018-05-11 NOTE — Discharge Summary (Signed)
Discharge summary job# [id number]

## 2018-05-11 NOTE — Progress Notes (Signed)
Occupational Therapy Session Note  Patient Details  Name: Casey Gilmore MRN: [id number] Date of Birth: [birth year]  Today's Date: 05/11/2018 OT Individual Time: 1105-1200 and 1415-1450 OT Individual Time Calculation (min): 55 min and 35 min   Short Term Goals: Week 3:  OT Short Term Goal 1 (Week 3): Pt will complete bathing with min assist with use of AE OT Short Term Goal 2 (Week 3): Pt will complete LB dressing with min assist with use of AE OT Short Term Goal 3 (Week 3): Pt will complete sit > stand with min assist of 1 caregiver for LB dressing and transfers OT Short Term Goal 4 (Week 3): Pt will complete toilet transfer with min assist   Skilled Therapeutic Interventions/Progress Updates:    1) Treatment session with focus on ADL retraining with decreasing burden of care with bathing and dressing tasks.  Engaged in bathing at sit > stand level with pt utilizing long handled sponge to wash lower legs, requiring assistance to wash LLE due to pain and fearfulness of pain with movement.  Mod assist sit > stand with pt able to maintain static standing with min assist while therapist washed buttocks.  Pt donned shirt with use of button hook to fasten buttons.  Completed LB dressing with use of reacher and long handled shoe horn.  Therapist provided assistance when threading LLE but pt able to thread RLE and pull pants over hips in standing with therapist providing mod assist to lift into standing and then only min assist for dynamic standing balance when pulling pants over hips.  Pt continues to require increased time and encouragement with all movements due to pain and fearfulness of falling.  2) Treatment session with focus on functional transfers.  Pt received in w/c dozing off, easily aroused to name.  Engaged in stand pivot transfers w/c <> BSC and w/c > bed.  Mod assist sit> stand with RW and once upright min assist with increased time and cues for sequencing to complete stand pivot.   Completed transfer back to w/c as previously stated.  Pt reporting fatigue and requesting to return to bed at end of session, therefore completed stand pivot transfer to bed with mod assist for sit > stand while securing RW.  Pt with increased fear during transfer to bed due to reports of weakness and fatigue.  Increased time for sitting to supine with therapist providing cues and assist to maneuver LLE into bed.    Therapy Documentation Precautions:  Precautions Precautions: Fall Restrictions Weight Bearing Restrictions: Yes LLE Weight Bearing: Touchdown weight bearing Other Position/Activity Restrictions: No ROM restrictions noted in note w/ hip, however pt reports pt has been limited to ~45 deg of L hip flexion since a hip surgery he had in the 1950s Pain:  Pt with c/o pain in Lt hip, not rated. RN notified.  See Function Navigator for Current Functional Status.   Therapy/Group: Individual Therapy  [name] 05/11/2018, 12:54 PM

## 2018-05-11 NOTE — Discharge Summary (Signed)
NAME: Casey Gilmore, Casey Gilmore MEDICAL RECORD [id number] ACCOUNT [id number] DATE OF BIRTH:08-Apr-1941 FACILITY: [facility]: [facility] PHYSICIAN:ANKIT PATEL, MD  DISCHARGE SUMMARY  DATE OF DISCHARGE:  05/12/2018  DISCHARGE DIAGNOSES: 1.  Left subtrochanteric femur fracture status post open reduction, internal fixation,  04/18/2018. 2.  Subcutaneous Lovenox for deep venous thrombosis prophylaxis. 3.  Pain management.   4.  Acute blood loss anemia.   5.  Recent fall with left humeral and radial and ulnar fractures. 6.  Diabetes mellitus. 7.  Hyperlipidemia. 8.  History of severe aortic stenosis with aortic valve replacement in 2012. 9.  History of gastrointestinal bleed.  HOSPITAL COURSE:  This is a 77 year old right-handed male, history of COPD, diabetes mellitus, AVR, recent fall with left humeral and radial and ulnar fractures.  Lives alone.  Independent with assistive device.  Presented 04/15/2018 after a fall with  complaints of left hip pain.  X-rays and imaging revealed complex left subtrochanteric hip fracture.  The patient was cleared by cardiology services.  Underwent removal of left femur hip effusion hardware with ORIF left proximal femur fracture by Dr.  Marcelino Scot 04/18/2018.   Touchdown weightbearing.  HOSPITAL COURSE:  Pain management.  Subcutaneous Lovenox for DVT prophylaxis.  Acute blood loss anemia, 9.1.  The patient was admitted for comprehensive rehabilitation program.  PAST MEDICAL HISTORY:  See discharge diagnoses.  SOCIAL HISTORY:  Lives alone.  FUNCTIONAL STATUS:  Upon admission to rehab services,  +2 physical assist sit to supine, supine to sit; max total assist activities of daily living.  PHYSICAL EXAMINATION: VITAL SIGNS:  Blood pressure 133/69, pulse 81, temperature 98, respirations 16. GENERAL:  Alert male in no acute distress.  EOMs intact. NECK:  Supple, nontender.  No JVD. CARDIOVASCULAR:  Rate controlled. ABDOMEN:  Soft, nontender, good bowel  sounds. LUNGS:  Clear to auscultation without wheeze.   Surgical site clean and dry.  REHABILITATION HOSPITAL COURSE:  The patient was admitted to inpatient rehabilitation services with therapies initiated on a 3-hour daily basis, consisting of physical therapy, occupational therapy and rehabilitation nursing.  The following issues were  addressed during the patient's rehabilitation stay:    Pertaining to the patient's  left subtrochanteric femur fracture, he had undergone ORIF 04/18/2018.  Neurovascular sensation intact.  Touchdown weightbearing x8 weeks.  Subcutaneous Lovenox for DVT prophylaxis.  Vascular studies negative.  He will  continue Lovenox 40 mg x2 more weeks and stop.  Pain management with the use of a Lidoderm patch as well as Robaxin and oxycodone for breakthrough pain.    Acute on chronic anemia 9.4.  No bleeding episodes.    Blood sugars well controlled.  Hemoglobin A1c 7.6.  He remained on Glucophage as well as Amaryl.    He exhibited no other signs of fluid overload.  He continued on Lasix as advised.    Noted history of aortic valve replacement in 2012.  No chest pain or increasing shortness of breath.    The patient received weekly collaborative interdisciplinary team conferences to discuss estimated length of stay, family teaching, any barriers to discharge.  He was ambulating 20 feet, rolling walker, moderate assist needing assistance for lower body  dressing.  Working with energy conservation techniques.  Due to limited assistance at home, it was felt skilled nursing facility was needed.  DISCHARGE MEDICATIONS:  Included Lovenox 40 mg subcutaneously daily x2 more weeks and stop, ferrous sulfate 325 mg p.o. b.i.d., Lasix 40 mg p.o. daily, Amaryl 1 mg p.o. at breakfast, Lidoderm patch daily, Glucophage 1000 mg  p.o. b.i.d., Protonix 40 mg  p.o. daily, MiraLax twice daily, hold for loose stools Pravachol 20 mg p.o. daily,  vitamin B12 4000 mcg p.o. daily, vitamin D 50,000  units p.o. every 7 days, Robaxin 500 mg p.o. every 6 hours as needed for muscle spasms, oxycodone 5-10 mg 3 times daily  as needed for pain.    His diet was 1800 calorie ADA diet.    FOLLOWUP:  He would follow up with Dr. Keith Rake at the outpatient rehabilitation service office as directed; Dr. Altamese Stanton 2 weeks, call for appointment.  Dr Golden Hurter cardiology services as needed, call for appointment.  Dr. Serita Grammes,  medical management.  SPECIAL INSTRUCTIONS:  Touchdown weightbearing left lower extremity x8 weeks total.  AN/NUANCE D:05/11/2018 T:05/11/2018 JOB:[id number]

## 2018-05-11 NOTE — Plan of Care (Signed)
  Problem: Consults Goal: RH GENERAL PATIENT EDUCATION Description See Patient Education module for education specifics. Outcome: Progressing   Problem: RH BOWEL ELIMINATION Goal: RH STG MANAGE BOWEL WITH ASSISTANCE Description STG Manage Bowel with Min.Assistance.  Outcome: Progressing Goal: RH STG MANAGE BOWEL W/MEDICATION W/ASSISTANCE Description STG Manage Bowel with Medication with Min.Assistance.  Outcome: Progressing   Problem: RH BLADDER ELIMINATION Goal: RH STG MANAGE BLADDER WITH ASSISTANCE Description STG Manage Bladder With Min. Assistance  Outcome: Progressing   Problem: RH SKIN INTEGRITY Goal: RH STG SKIN FREE OF INFECTION/BREAKDOWN Description With min. Assist.  Outcome: Progressing Goal: RH STG MAINTAIN SKIN INTEGRITY WITH ASSISTANCE Description STG Maintain Skin Integrity With Min. Assistance.  Outcome: Progressing   Problem: RH SAFETY Goal: RH STG ADHERE TO SAFETY PRECAUTIONS W/ASSISTANCE/DEVICE Description STG Adhere to Safety Precautions With Mod.Assistance/Device.  Outcome: Progressing Goal: RH STG DECREASED RISK OF FALL WITH ASSISTANCE Description STG Decreased Risk of Fall With Mod. Assistance.  Outcome: Progressing   Problem: RH PAIN MANAGEMENT Goal: RH STG PAIN MANAGED AT OR BELOW PT'S PAIN GOAL Description Less than 3,on 1 to 10 scale  Outcome: Progressing

## 2018-05-12 DIAGNOSIS — M6281 Muscle weakness (generalized): Secondary | ICD-10-CM | POA: Diagnosis not present

## 2018-05-12 DIAGNOSIS — M2550 Pain in unspecified joint: Secondary | ICD-10-CM | POA: Diagnosis not present

## 2018-05-12 DIAGNOSIS — S7222XS Displaced subtrochanteric fracture of left femur, sequela: Secondary | ICD-10-CM | POA: Diagnosis not present

## 2018-05-12 DIAGNOSIS — G894 Chronic pain syndrome: Secondary | ICD-10-CM | POA: Diagnosis not present

## 2018-05-12 DIAGNOSIS — H04123 Dry eye syndrome of bilateral lacrimal glands: Secondary | ICD-10-CM | POA: Diagnosis not present

## 2018-05-12 DIAGNOSIS — S7222XA Displaced subtrochanteric fracture of left femur, initial encounter for closed fracture: Secondary | ICD-10-CM | POA: Diagnosis not present

## 2018-05-12 DIAGNOSIS — R2689 Other abnormalities of gait and mobility: Secondary | ICD-10-CM | POA: Diagnosis not present

## 2018-05-12 DIAGNOSIS — S7292XD Unspecified fracture of left femur, subsequent encounter for closed fracture with routine healing: Secondary | ICD-10-CM | POA: Diagnosis not present

## 2018-05-12 DIAGNOSIS — I350 Nonrheumatic aortic (valve) stenosis: Secondary | ICD-10-CM | POA: Diagnosis not present

## 2018-05-12 DIAGNOSIS — D649 Anemia, unspecified: Secondary | ICD-10-CM | POA: Diagnosis not present

## 2018-05-12 DIAGNOSIS — E119 Type 2 diabetes mellitus without complications: Secondary | ICD-10-CM | POA: Diagnosis not present

## 2018-05-12 DIAGNOSIS — J449 Chronic obstructive pulmonary disease, unspecified: Secondary | ICD-10-CM | POA: Diagnosis not present

## 2018-05-12 DIAGNOSIS — M1991 Primary osteoarthritis, unspecified site: Secondary | ICD-10-CM | POA: Diagnosis not present

## 2018-05-12 DIAGNOSIS — M79605 Pain in left leg: Secondary | ICD-10-CM | POA: Diagnosis not present

## 2018-05-12 DIAGNOSIS — Z299 Encounter for prophylactic measures, unspecified: Secondary | ICD-10-CM | POA: Diagnosis not present

## 2018-05-12 DIAGNOSIS — E785 Hyperlipidemia, unspecified: Secondary | ICD-10-CM | POA: Diagnosis not present

## 2018-05-12 DIAGNOSIS — Z9181 History of falling: Secondary | ICD-10-CM | POA: Diagnosis not present

## 2018-05-12 DIAGNOSIS — K5900 Constipation, unspecified: Secondary | ICD-10-CM | POA: Diagnosis not present

## 2018-05-12 DIAGNOSIS — D500 Iron deficiency anemia secondary to blood loss (chronic): Secondary | ICD-10-CM | POA: Diagnosis not present

## 2018-05-12 DIAGNOSIS — G8918 Other acute postprocedural pain: Secondary | ICD-10-CM | POA: Diagnosis not present

## 2018-05-12 DIAGNOSIS — Z7401 Bed confinement status: Secondary | ICD-10-CM | POA: Diagnosis not present

## 2018-05-12 DIAGNOSIS — S7222XD Displaced subtrochanteric fracture of left femur, subsequent encounter for closed fracture with routine healing: Secondary | ICD-10-CM | POA: Diagnosis not present

## 2018-05-12 DIAGNOSIS — I10 Essential (primary) hypertension: Secondary | ICD-10-CM | POA: Diagnosis not present

## 2018-05-12 DIAGNOSIS — D508 Other iron deficiency anemias: Secondary | ICD-10-CM | POA: Diagnosis not present

## 2018-05-12 LAB — GLUCOSE, CAPILLARY: Glucose-Capillary: 103 mg/dL — ABNORMAL HIGH (ref 65–99)

## 2018-05-12 NOTE — Progress Notes (Signed)
Social Work  Discharge Note  The overall goal for the admission was met for:   Discharge location: No - plan changed to SNF as family unable to provide 24/7 assistance  Length of Stay: Yes - 18 days  Discharge activity level: Yes - mod assist overall at w/c level  Home/community participation: Yes  Services provided included: MD, RD, PT, OT, RN, TR, Pharmacy and [place]: [facility] Medicare  Follow-up services arranged: Other: SNF placement at [place] (or additional information):  Patient/Family verbalized understanding of follow-up arrangements: Yes  Individual responsible for coordination of the follow-up plan: pt  Confirmed correct DME delivered: NA    Casey Gilmore

## 2018-05-12 NOTE — Plan of Care (Signed)
  Problem: Consults Goal: RH GENERAL PATIENT EDUCATION Description See Patient Education module for education specifics. Outcome: Completed/Met   Problem: RH BOWEL ELIMINATION Goal: RH STG MANAGE BOWEL WITH ASSISTANCE Description STG Manage Bowel with Min.Assistance.  Outcome: Completed/Met Goal: RH STG MANAGE BOWEL W/MEDICATION W/ASSISTANCE Description STG Manage Bowel with Medication with Min.Assistance.  Outcome: Completed/Met   Problem: RH BLADDER ELIMINATION Goal: RH STG MANAGE BLADDER WITH ASSISTANCE Description STG Manage Bladder With Min. Assistance  Outcome: Completed/Met   Problem: RH SKIN INTEGRITY Goal: RH STG SKIN FREE OF INFECTION/BREAKDOWN Description With min. Assist.  Outcome: Completed/Met Goal: RH STG MAINTAIN SKIN INTEGRITY WITH ASSISTANCE Description STG Maintain Skin Integrity With Min. Assistance.  Outcome: Completed/Met   Problem: RH SAFETY Goal: RH STG ADHERE TO SAFETY PRECAUTIONS W/ASSISTANCE/DEVICE Description STG Adhere to Safety Precautions With Mod.Assistance/Device.  Outcome: Completed/Met Goal: RH STG DECREASED RISK OF FALL WITH ASSISTANCE Description STG Decreased Risk of Fall With Mod. Assistance.  Outcome: Completed/Met   Problem: RH PAIN MANAGEMENT Goal: RH STG PAIN MANAGED AT OR BELOW PT'S PAIN GOAL Description Less than 3,on 1 to 10 scale  Outcome: Completed/Met

## 2018-05-12 NOTE — Progress Notes (Signed)
Report called to Isaias Cowman, Jenny Reichmann, RN.  All questions answered to [facility] satisfaction.  Patients belongings packed up and ready for transport.  Patient informed of impending transfer later this morning.  [name], RN

## 2018-05-12 NOTE — Progress Notes (Signed)
[place] PHYSICAL MEDICINE & REHABILITATION     PROGRESS NOTE  Subjective/Complaints:  Patient seen sitting up in bed this morning He states he slept well overnight. He states he is ready for discharge. He is appreciative of his care.  ROS: Denies CP, SOB, nausea, vomiting, diarrhea.  Objective: Vital Signs: Blood pressure 117/69, pulse 86, temperature 98.5 F (36.9 C), temperature source Oral, resp. rate 18, weight 78.7 kg (173 lb 8 oz), SpO2 96 %. No results found. No results for input(s): WBC, HGB, HCT, PLT in the last 72 hours. No results for input(s): NA, K, CL, GLUCOSE, BUN, CREATININE, CALCIUM in the last 72 hours.  Invalid input(s): CO CBG (last 3)  Recent Labs    05/11/18 1644 05/11/18 2110 05/12/18 0658  GLUCAP 91 102* 103*    Wt Readings from Last 3 Encounters:  04/24/18 78.7 kg (173 lb 8 oz)  04/22/18 81.3 kg (179 lb 3.7 oz)  01/19/18 88.5 kg (195 lb)    Physical Exam:  BP 117/69 (BP Location: Left Arm)   Pulse 86   Temp 98.5 F (36.9 C) (Oral)   Resp 18   Wt 78.7 kg (173 lb 8 oz)   SpO2 96%   BMI 23.53 kg/m  Constitutional: He appears well-developed and well-nourished. NAD. HENT: Normocephalic and atraumatic.  Eyes: EOM are normal. No discharge.  Cardiovascular: RRR. No JVD.  Respiratory: Effort normal and breath sounds normal.  GI: Bowel sounds are normal. He exhibits no distension.  Musculoskeletal: Left hip edema and tenderness, continues to improve  Neurological: He is alert and oriented.  Motor:  Right lower extremity: Hip flexion, knee extension 4+/5, ankle dorsiflexion 4+/5 (stable) Left lower extremity: Hip flexion, knee extension 1+/5, ankle dorsiflexion 4+/5 (stable) Skin: Surgical site remains c/d/i Psychiatric: He has a normal mood and affect. His behavior is normal.   Assessment/Plan: 1. Functional deficits secondary to left hip fracture which require 3+ hours per day of interdisciplinary therapy in a comprehensive inpatient rehab  setting. Physiatrist is providing close team supervision and 24 hour management of active medical problems listed below. Physiatrist and rehab team continue to assess barriers to discharge/monitor patient progress toward functional and medical goals.  Function:  Bathing Bathing position   Position: Wheelchair/chair at sink  Bathing parts Body parts bathed by patient: Right arm, Left arm, Chest, Abdomen, Front perineal area, Right upper leg, Left upper leg, Right lower leg Body parts bathed by helper: Back, Buttocks, Left lower leg  Bathing assist Assist Level: Touching or steadying assistance(Pt > 75%)(Min assist)      Upper Body Dressing/Undressing Upper body dressing   What is the patient wearing?: Button up shirt         Button up shirt - Perfomed by patient: Thread/unthread right sleeve, Thread/unthread left sleeve, Button/unbutton shirt Button up shirt - Perfomed by helper: Pull shirt around back    Upper body assist Assist Level: Assistive device, Touching or steadying assistance(Pt > 75%) Assistive Device Comment: button hook    Lower Body Dressing/Undressing Lower body dressing   What is the patient wearing?: Pants, Shoes   Underwear - Performed by helper: Thread/unthread right underwear leg, Thread/unthread left underwear leg, Pull underwear up/down Pants- Performed by patient: Thread/unthread right pants leg, Pull pants up/down Pants- Performed by helper: Thread/unthread left pants leg Non-skid slipper socks- Performed by patient: Don/doff right sock, Don/doff left sock(doff) Non-skid slipper socks- Performed by helper: Don/doff right sock, Don/doff left sock(don)     Shoes - Performed by patient:  Don/doff right shoe Shoes - Performed by helper: Don/doff left shoe          Lower body assist Assist for lower body dressing: (Min assist)      Toileting Toileting Toileting activity did not occur: N/A Toileting steps completed by patient: Adjust clothing prior to  toileting, Performs perineal hygiene Toileting steps completed by helper: Adjust clothing after toileting Toileting Assistive Devices: Grab bar or rail  Toileting assist Assist level: Two helpers   Transfers Chair/bed transfer   Chair/bed transfer method: Stand pivot Chair/bed transfer assist level: Moderate assist (Pt 50 - 74%/lift or lower) Chair/bed transfer assistive device: Armrests, Walker     Locomotion Ambulation Ambulation activity did not occur: Safety/medical concerns   Max distance: 22ft Assist level: Moderate assist (Pt 50 - 74%)   Wheelchair Wheelchair activity did not occur: Safety/medical concerns Type: Manual Max wheelchair distance: 173ft  Assist Level: Supervision or verbal cues  Cognition Comprehension Comprehension assist level: Understands complex 90% of the time/cues 10% of the time  Expression Expression assist level: Expresses complex ideas: With no assist  Social Interaction Social Interaction assist level: Interacts appropriately with others with medication or extra time (anti-anxiety, antidepressant).  Problem Solving Problem solving assist level: Solves basic 90% of the time/requires cueing < 10% of the time  Memory Memory assist level: Recognizes or recalls 90% of the time/requires cueing < 10% of the time    Medical Problem List and Plan: 1.  Decreased functional mobility secondary to left subtrochanteric femur fracture, retained hip fusion hardware placed in the 1950s and broken hardware status post ORIF 04/18/2018.  Touchdown weightbearing x8 weeks   DC to SNF today  Will see patient in 1 month for hospital follow-up 2.  DVT Prophylaxis/Anticoagulation: Subcutaneous Lovenox.     Vascular study negative 3. Pain Management: Oxycodone as needed, frequency decreased on 5/23, Decreased again on 6/5  Robaxin when necessary started on 5/23   Unclear picture regarding home narcotic regimen  Lidoderm patch added on 6/6  Controlled 4. Mood: Provide  emotional support 5. Neuropsych: This patient is capable of making decisions on his own behalf. 6. Skin/Wound Care: Routine skin checks  DCed staples on 5/27 7. Fluids/Electrolytes/Nutrition: Routine in and outs    BMP within acceptable range on 5/21, BUN elevated  Encourage fluids, IVF d/ced 8.  Acute on chronic anemia.  Continue iron supplement   Hemoglobin 9.4 on 6/4   Continue to monitor 9.  Recent fall with left humeral and radial/ulnar fractures. 10.  Type 2 diabetes mellitus.  Hemoglobin A1c 7.6.  SSI.  Check blood sugars before meals and at bedtime (on metformin 1000 twice a day, Amaryl 4 mg daily PTA).   Metformin 500 twice a day started on 5/22, increased to 1000mg  on 5/24   Amaryl 1 mg started on 5/27  CBG (last 3)  Recent Labs    05/11/18 1644 05/11/18 2110 05/12/18 0658  GLUCAP 91 102* 103*   Controlled at 6/7 11.  Hyperlipidemia.  Pravachol 12.  History of severe aortic stenosis.  Status post bioprosthetic AVR 2012. 13.  History of GI bleed.  Continue Protonix   See #8 14. Hypoalbuminemia   Supplement initiated on 5/21 15. 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

## 2018-05-15 DIAGNOSIS — D649 Anemia, unspecified: Secondary | ICD-10-CM | POA: Diagnosis not present

## 2018-05-15 DIAGNOSIS — E119 Type 2 diabetes mellitus without complications: Secondary | ICD-10-CM | POA: Diagnosis not present

## 2018-05-15 DIAGNOSIS — Z299 Encounter for prophylactic measures, unspecified: Secondary | ICD-10-CM | POA: Diagnosis not present

## 2018-05-15 DIAGNOSIS — S7222XA Displaced subtrochanteric fracture of left femur, initial encounter for closed fracture: Secondary | ICD-10-CM | POA: Diagnosis not present

## 2018-05-18 DIAGNOSIS — G894 Chronic pain syndrome: Secondary | ICD-10-CM | POA: Diagnosis not present

## 2018-05-18 DIAGNOSIS — M79605 Pain in left leg: Secondary | ICD-10-CM | POA: Diagnosis not present

## 2018-05-18 DIAGNOSIS — S7222XA Displaced subtrochanteric fracture of left femur, initial encounter for closed fracture: Secondary | ICD-10-CM | POA: Diagnosis not present

## 2018-05-18 DIAGNOSIS — Z9181 History of falling: Secondary | ICD-10-CM | POA: Diagnosis not present

## 2018-05-18 DIAGNOSIS — D649 Anemia, unspecified: Secondary | ICD-10-CM | POA: Diagnosis not present

## 2018-05-18 DIAGNOSIS — I10 Essential (primary) hypertension: Secondary | ICD-10-CM | POA: Diagnosis not present

## 2018-05-18 DIAGNOSIS — R2689 Other abnormalities of gait and mobility: Secondary | ICD-10-CM | POA: Diagnosis not present

## 2018-05-18 DIAGNOSIS — E119 Type 2 diabetes mellitus without complications: Secondary | ICD-10-CM | POA: Diagnosis not present

## 2018-05-22 DIAGNOSIS — G894 Chronic pain syndrome: Secondary | ICD-10-CM | POA: Diagnosis not present

## 2018-05-22 DIAGNOSIS — Z9181 History of falling: Secondary | ICD-10-CM | POA: Diagnosis not present

## 2018-05-22 DIAGNOSIS — M79605 Pain in left leg: Secondary | ICD-10-CM | POA: Diagnosis not present

## 2018-05-22 DIAGNOSIS — R2689 Other abnormalities of gait and mobility: Secondary | ICD-10-CM | POA: Diagnosis not present

## 2018-05-24 DIAGNOSIS — G894 Chronic pain syndrome: Secondary | ICD-10-CM | POA: Diagnosis not present

## 2018-05-24 DIAGNOSIS — R2689 Other abnormalities of gait and mobility: Secondary | ICD-10-CM | POA: Diagnosis not present

## 2018-05-24 DIAGNOSIS — M79605 Pain in left leg: Secondary | ICD-10-CM | POA: Diagnosis not present

## 2018-05-24 DIAGNOSIS — H04123 Dry eye syndrome of bilateral lacrimal glands: Secondary | ICD-10-CM | POA: Diagnosis not present

## 2018-05-24 DIAGNOSIS — I10 Essential (primary) hypertension: Secondary | ICD-10-CM | POA: Diagnosis not present

## 2018-05-24 DIAGNOSIS — K5900 Constipation, unspecified: Secondary | ICD-10-CM | POA: Diagnosis not present

## 2018-05-24 DIAGNOSIS — Z9181 History of falling: Secondary | ICD-10-CM | POA: Diagnosis not present

## 2018-05-24 DIAGNOSIS — S7222XD Displaced subtrochanteric fracture of left femur, subsequent encounter for closed fracture with routine healing: Secondary | ICD-10-CM | POA: Diagnosis not present

## 2018-05-26 DIAGNOSIS — Z9181 History of falling: Secondary | ICD-10-CM | POA: Diagnosis not present

## 2018-05-26 DIAGNOSIS — R2689 Other abnormalities of gait and mobility: Secondary | ICD-10-CM | POA: Diagnosis not present

## 2018-05-26 DIAGNOSIS — G894 Chronic pain syndrome: Secondary | ICD-10-CM | POA: Diagnosis not present

## 2018-05-26 DIAGNOSIS — D649 Anemia, unspecified: Secondary | ICD-10-CM | POA: Diagnosis not present

## 2018-05-26 DIAGNOSIS — M79605 Pain in left leg: Secondary | ICD-10-CM | POA: Diagnosis not present

## 2018-05-30 DIAGNOSIS — G894 Chronic pain syndrome: Secondary | ICD-10-CM | POA: Diagnosis not present

## 2018-05-30 DIAGNOSIS — R2689 Other abnormalities of gait and mobility: Secondary | ICD-10-CM | POA: Diagnosis not present

## 2018-05-30 DIAGNOSIS — M79605 Pain in left leg: Secondary | ICD-10-CM | POA: Diagnosis not present

## 2018-05-30 DIAGNOSIS — Z9181 History of falling: Secondary | ICD-10-CM | POA: Diagnosis not present

## 2018-06-01 DIAGNOSIS — D649 Anemia, unspecified: Secondary | ICD-10-CM | POA: Diagnosis not present

## 2018-06-01 DIAGNOSIS — Z9181 History of falling: Secondary | ICD-10-CM | POA: Diagnosis not present

## 2018-06-01 DIAGNOSIS — R2689 Other abnormalities of gait and mobility: Secondary | ICD-10-CM | POA: Diagnosis not present

## 2018-06-01 DIAGNOSIS — G894 Chronic pain syndrome: Secondary | ICD-10-CM | POA: Diagnosis not present

## 2018-06-01 DIAGNOSIS — M79605 Pain in left leg: Secondary | ICD-10-CM | POA: Diagnosis not present

## 2018-06-01 DIAGNOSIS — J449 Chronic obstructive pulmonary disease, unspecified: Secondary | ICD-10-CM | POA: Diagnosis not present

## 2018-06-01 DIAGNOSIS — S7222XD Displaced subtrochanteric fracture of left femur, subsequent encounter for closed fracture with routine healing: Secondary | ICD-10-CM | POA: Diagnosis not present

## 2018-06-01 DIAGNOSIS — Z299 Encounter for prophylactic measures, unspecified: Secondary | ICD-10-CM | POA: Diagnosis not present

## 2018-06-03 DIAGNOSIS — S5292XD Unspecified fracture of left forearm, subsequent encounter for closed fracture with routine healing: Secondary | ICD-10-CM | POA: Diagnosis not present

## 2018-06-03 DIAGNOSIS — E119 Type 2 diabetes mellitus without complications: Secondary | ICD-10-CM | POA: Diagnosis not present

## 2018-06-03 DIAGNOSIS — I10 Essential (primary) hypertension: Secondary | ICD-10-CM | POA: Diagnosis not present

## 2018-06-03 DIAGNOSIS — S52202D Unspecified fracture of shaft of left ulna, subsequent encounter for closed fracture with routine healing: Secondary | ICD-10-CM | POA: Diagnosis not present

## 2018-06-05 DIAGNOSIS — J449 Chronic obstructive pulmonary disease, unspecified: Secondary | ICD-10-CM | POA: Diagnosis not present

## 2018-06-05 DIAGNOSIS — Z4789 Encounter for other orthopedic aftercare: Secondary | ICD-10-CM | POA: Diagnosis not present

## 2018-06-05 DIAGNOSIS — M1991 Primary osteoarthritis, unspecified site: Secondary | ICD-10-CM | POA: Diagnosis not present

## 2018-06-16 ENCOUNTER — Inpatient Hospital Stay: Payer: Medicare Other | Admitting: Physical Medicine & Rehabilitation

## 2018-06-26 DIAGNOSIS — S7222XD Displaced subtrochanteric fracture of left femur, subsequent encounter for closed fracture with routine healing: Secondary | ICD-10-CM | POA: Diagnosis not present

## 2018-07-06 DIAGNOSIS — J449 Chronic obstructive pulmonary disease, unspecified: Secondary | ICD-10-CM | POA: Diagnosis not present

## 2018-07-06 DIAGNOSIS — Z4789 Encounter for other orthopedic aftercare: Secondary | ICD-10-CM | POA: Diagnosis not present

## 2018-07-06 DIAGNOSIS — M1991 Primary osteoarthritis, unspecified site: Secondary | ICD-10-CM | POA: Diagnosis not present

## 2018-07-26 ENCOUNTER — Emergency Department (HOSPITAL_COMMUNITY): Payer: Medicare Other

## 2018-07-26 ENCOUNTER — Observation Stay (HOSPITAL_COMMUNITY)
Admission: EM | Admit: 2018-07-26 | Discharge: 2018-07-28 | Disposition: A | Discharge status: Skilled Nursing Facility | Payer: Medicare Other | Attending: Internal Medicine | Admitting: Internal Medicine

## 2018-07-26 ENCOUNTER — Encounter (HOSPITAL_COMMUNITY): Payer: Self-pay | Admitting: Internal Medicine

## 2018-07-26 DIAGNOSIS — J449 Chronic obstructive pulmonary disease, unspecified: Secondary | ICD-10-CM | POA: Insufficient documentation

## 2018-07-26 DIAGNOSIS — M545 Low back pain: Secondary | ICD-10-CM | POA: Diagnosis not present

## 2018-07-26 DIAGNOSIS — W19XXXA Unspecified fall, initial encounter: Secondary | ICD-10-CM | POA: Diagnosis not present

## 2018-07-26 DIAGNOSIS — M25551 Pain in right hip: Secondary | ICD-10-CM | POA: Insufficient documentation

## 2018-07-26 DIAGNOSIS — F0390 Unspecified dementia without behavioral disturbance: Secondary | ICD-10-CM | POA: Insufficient documentation

## 2018-07-26 DIAGNOSIS — E876 Hypokalemia: Secondary | ICD-10-CM | POA: Insufficient documentation

## 2018-07-26 DIAGNOSIS — K219 Gastro-esophageal reflux disease without esophagitis: Secondary | ICD-10-CM | POA: Diagnosis not present

## 2018-07-26 DIAGNOSIS — R109 Unspecified abdominal pain: Secondary | ICD-10-CM | POA: Diagnosis not present

## 2018-07-26 DIAGNOSIS — Z953 Presence of xenogenic heart valve: Secondary | ICD-10-CM | POA: Insufficient documentation

## 2018-07-26 DIAGNOSIS — I110 Hypertensive heart disease with heart failure: Secondary | ICD-10-CM | POA: Diagnosis not present

## 2018-07-26 DIAGNOSIS — R000 Tachycardia, unspecified: Secondary | ICD-10-CM | POA: Insufficient documentation

## 2018-07-26 DIAGNOSIS — E119 Type 2 diabetes mellitus without complications: Secondary | ICD-10-CM | POA: Insufficient documentation

## 2018-07-26 DIAGNOSIS — Z87891 Personal history of nicotine dependence: Secondary | ICD-10-CM | POA: Insufficient documentation

## 2018-07-26 DIAGNOSIS — Z96641 Presence of right artificial hip joint: Secondary | ICD-10-CM | POA: Insufficient documentation

## 2018-07-26 DIAGNOSIS — I4891 Unspecified atrial fibrillation: Secondary | ICD-10-CM | POA: Diagnosis not present

## 2018-07-26 DIAGNOSIS — Y92009 Unspecified place in unspecified non-institutional (private) residence as the place of occurrence of the external cause: Secondary | ICD-10-CM | POA: Insufficient documentation

## 2018-07-26 DIAGNOSIS — I5032 Chronic diastolic (congestive) heart failure: Secondary | ICD-10-CM | POA: Diagnosis not present

## 2018-07-26 DIAGNOSIS — D509 Iron deficiency anemia, unspecified: Secondary | ICD-10-CM | POA: Diagnosis not present

## 2018-07-26 DIAGNOSIS — I350 Nonrheumatic aortic (valve) stenosis: Secondary | ICD-10-CM | POA: Insufficient documentation

## 2018-07-26 DIAGNOSIS — R627 Adult failure to thrive: Secondary | ICD-10-CM | POA: Diagnosis not present

## 2018-07-26 DIAGNOSIS — R531 Weakness: Secondary | ICD-10-CM | POA: Diagnosis not present

## 2018-07-26 DIAGNOSIS — I700 Atherosclerosis of aorta: Secondary | ICD-10-CM | POA: Insufficient documentation

## 2018-07-26 DIAGNOSIS — R0902 Hypoxemia: Secondary | ICD-10-CM | POA: Insufficient documentation

## 2018-07-26 DIAGNOSIS — R296 Repeated falls: Secondary | ICD-10-CM

## 2018-07-26 DIAGNOSIS — G894 Chronic pain syndrome: Secondary | ICD-10-CM | POA: Insufficient documentation

## 2018-07-26 DIAGNOSIS — Z8521 Personal history of malignant neoplasm of larynx: Secondary | ICD-10-CM | POA: Insufficient documentation

## 2018-07-26 DIAGNOSIS — J9811 Atelectasis: Secondary | ICD-10-CM | POA: Diagnosis not present

## 2018-07-26 DIAGNOSIS — Z299 Encounter for prophylactic measures, unspecified: Secondary | ICD-10-CM | POA: Insufficient documentation

## 2018-07-26 DIAGNOSIS — K449 Diaphragmatic hernia without obstruction or gangrene: Secondary | ICD-10-CM | POA: Insufficient documentation

## 2018-07-26 DIAGNOSIS — I2510 Atherosclerotic heart disease of native coronary artery without angina pectoris: Secondary | ICD-10-CM | POA: Insufficient documentation

## 2018-07-26 DIAGNOSIS — Z993 Dependence on wheelchair: Secondary | ICD-10-CM | POA: Insufficient documentation

## 2018-07-26 DIAGNOSIS — M549 Dorsalgia, unspecified: Secondary | ICD-10-CM | POA: Diagnosis present

## 2018-07-26 DIAGNOSIS — D638 Anemia in other chronic diseases classified elsewhere: Secondary | ICD-10-CM | POA: Insufficient documentation

## 2018-07-26 DIAGNOSIS — E785 Hyperlipidemia, unspecified: Secondary | ICD-10-CM | POA: Insufficient documentation

## 2018-07-26 DIAGNOSIS — I252 Old myocardial infarction: Secondary | ICD-10-CM | POA: Insufficient documentation

## 2018-07-26 DIAGNOSIS — J4489 Other specified chronic obstructive pulmonary disease: Secondary | ICD-10-CM | POA: Diagnosis present

## 2018-07-26 DIAGNOSIS — K5900 Constipation, unspecified: Secondary | ICD-10-CM | POA: Diagnosis not present

## 2018-07-26 DIAGNOSIS — Z7984 Long term (current) use of oral hypoglycemic drugs: Secondary | ICD-10-CM | POA: Diagnosis not present

## 2018-07-26 DIAGNOSIS — D72829 Elevated white blood cell count, unspecified: Secondary | ICD-10-CM | POA: Diagnosis not present

## 2018-07-26 DIAGNOSIS — I491 Atrial premature depolarization: Secondary | ICD-10-CM | POA: Diagnosis not present

## 2018-07-26 DIAGNOSIS — M1990 Unspecified osteoarthritis, unspecified site: Secondary | ICD-10-CM | POA: Diagnosis present

## 2018-07-26 DIAGNOSIS — I959 Hypotension, unspecified: Secondary | ICD-10-CM | POA: Diagnosis not present

## 2018-07-26 DIAGNOSIS — S3991XA Unspecified injury of abdomen, initial encounter: Secondary | ICD-10-CM | POA: Diagnosis not present

## 2018-07-26 DIAGNOSIS — Z79899 Other long term (current) drug therapy: Secondary | ICD-10-CM | POA: Insufficient documentation

## 2018-07-26 LAB — COMPREHENSIVE METABOLIC PANEL
ALT: 11 U/L (ref 0–44)
AST: 14 U/L — AB (ref 15–41)
Albumin: 3 g/dL — ABNORMAL LOW (ref 3.5–5.0)
Alkaline Phosphatase: 127 U/L — ABNORMAL HIGH (ref 38–126)
Anion gap: 8 (ref 5–15)
BUN: 17 mg/dL (ref 8–23)
CHLORIDE: 105 mmol/L (ref 98–111)
CO2: 29 mmol/L (ref 22–32)
Calcium: 9 mg/dL (ref 8.9–10.3)
Creatinine, Ser: 1.11 mg/dL (ref 0.61–1.24)
GFR calc Af Amer: >60 mL/min (ref >60)
GFR calc non Af Amer: >60 mL/min (ref >60)
GLUCOSE: 106 mg/dL — AB (ref 70–99)
POTASSIUM: 3.6 mmol/L (ref 3.5–5.1)
SODIUM: 142 mmol/L (ref 135–145)
Total Bilirubin: 0.8 mg/dL (ref 0.3–1.2)
Total Protein: 6.3 g/dL — ABNORMAL LOW (ref 6.5–8.1)

## 2018-07-26 LAB — CBC WITH DIFFERENTIAL/PLATELET
Abs Immature Granulocytes: 0.1 10*3/uL (ref 0.0–0.1)
BASOS ABS: 0.1 10*3/uL (ref 0.0–0.1)
Basophils Relative: 0 %
Eosinophils Absolute: 0 10*3/uL (ref 0.0–0.7)
Eosinophils Relative: 0 %
HCT: 33.2 % — ABNORMAL LOW (ref 39.0–52.0)
Hemoglobin: 9.8 g/dL — ABNORMAL LOW (ref 13.0–17.0)
Immature Granulocytes: 1 %
LYMPHS ABS: 2 10*3/uL (ref 0.7–4.0)
LYMPHS PCT: 13 %
MCH: 23.1 pg — AB (ref 26.0–34.0)
MCHC: 29.5 g/dL — ABNORMAL LOW (ref 30.0–36.0)
MCV: 78.1 fL (ref 78.0–100.0)
MONO ABS: 1.1 10*3/uL — AB (ref 0.1–1.0)
Monocytes Relative: 7 %
Neutro Abs: 11.9 10*3/uL — ABNORMAL HIGH (ref 1.7–7.7)
Neutrophils Relative %: 79 %
Platelets: 247 10*3/uL (ref 150–400)
RBC: 4.25 MIL/uL (ref 4.22–5.81)
RDW: 16.5 % — ABNORMAL HIGH (ref 11.5–15.5)
WBC: 15.1 10*3/uL — ABNORMAL HIGH (ref 4.0–10.5)

## 2018-07-26 LAB — RAPID URINE DRUG SCREEN, HOSP PERFORMED
Amphetamines: NOT DETECTED
BENZODIAZEPINES: NOT DETECTED
Barbiturates: NOT DETECTED
COCAINE: NOT DETECTED
OPIATES: POSITIVE — AB
Tetrahydrocannabinol: NOT DETECTED

## 2018-07-26 LAB — URINALYSIS, ROUTINE W REFLEX MICROSCOPIC
Bilirubin Urine: NEGATIVE
Glucose, UA: NEGATIVE mg/dL
Hgb urine dipstick: NEGATIVE
Ketones, ur: NEGATIVE mg/dL
LEUKOCYTES UA: NEGATIVE
Nitrite: NEGATIVE
PROTEIN: 30 mg/dL — AB
Specific Gravity, Urine: >1.046 — ABNORMAL HIGH (ref 1.005–1.030)
pH: 5 (ref 5.0–8.0)

## 2018-07-26 LAB — PROTIME-INR
INR: 1.05
PROTHROMBIN TIME: 13.6 s (ref 11.4–15.2)

## 2018-07-26 LAB — PHOSPHORUS: Phosphorus: 2.4 mg/dL — ABNORMAL LOW (ref 2.5–4.6)

## 2018-07-26 LAB — I-STAT TROPONIN, ED: Troponin i, poc: 0.04 ng/mL (ref 0.00–0.08)

## 2018-07-26 LAB — ETHANOL

## 2018-07-26 LAB — I-STAT CG4 LACTIC ACID, ED: Lactic Acid, Venous: 1.4 mmol/L (ref 0.5–1.9)

## 2018-07-26 LAB — LIPASE, BLOOD: LIPASE: 21 U/L (ref 11–51)

## 2018-07-26 LAB — CK: Total CK: 48 U/L — ABNORMAL LOW (ref 49–397)

## 2018-07-26 LAB — GLUCOSE, CAPILLARY
Glucose-Capillary: 64 mg/dL — ABNORMAL LOW (ref 70–99)
Glucose-Capillary: 76 mg/dL (ref 70–99)

## 2018-07-26 LAB — MAGNESIUM: Magnesium: 1.8 mg/dL (ref 1.7–2.4)

## 2018-07-26 MED ORDER — VITAMIN B-12 1000 MCG PO TABS
4000.0000 ug | ORAL_TABLET | Freq: Every day | ORAL | Status: DC
  Administered 2018-07-27 – 2018-07-28 (×2): 4000 ug via ORAL
  Filled 2018-07-26 (×2): qty 4

## 2018-07-26 MED ORDER — IOPAMIDOL (ISOVUE-300) INJECTION 61%
100.0000 mL | Freq: Once | INTRAVENOUS | Status: AC | PRN
  Administered 2018-07-26: 100 mL via INTRAVENOUS

## 2018-07-26 MED ORDER — ONDANSETRON HCL 4 MG/2ML IJ SOLN: 4.0000 mg | Freq: Four times a day (QID) | INTRAMUSCULAR | Status: DC | PRN

## 2018-07-26 MED ORDER — INSULIN ASPART 100 UNIT/ML ~~LOC~~ SOLN: 0.0000 [IU] | Freq: Every day | SUBCUTANEOUS | Status: DC

## 2018-07-26 MED ORDER — LIDOCAINE 5 % EX PTCH
1.0000 | MEDICATED_PATCH | CUTANEOUS | Status: DC
  Administered 2018-07-27 – 2018-07-28 (×2): 1 via TRANSDERMAL
  Filled 2018-07-26 (×2): qty 1

## 2018-07-26 MED ORDER — PANTOPRAZOLE SODIUM 40 MG PO TBEC
40.0000 mg | DELAYED_RELEASE_TABLET | Freq: Every day | ORAL | Status: DC
  Administered 2018-07-27 – 2018-07-28 (×2): 40 mg via ORAL
  Filled 2018-07-26 (×2): qty 1

## 2018-07-26 MED ORDER — IOPAMIDOL (ISOVUE-300) INJECTION 61%
INTRAVENOUS | Status: AC
  Filled 2018-07-26: qty 100

## 2018-07-26 MED ORDER — ENOXAPARIN SODIUM 40 MG/0.4ML ~~LOC~~ SOLN
40.0000 mg | SUBCUTANEOUS | Status: DC
  Administered 2018-07-26 – 2018-07-27 (×2): 40 mg via SUBCUTANEOUS
  Filled 2018-07-26 (×2): qty 0.4

## 2018-07-26 MED ORDER — HYDROMORPHONE HCL 1 MG/ML IJ SOLN
0.5000 mg | Freq: Once | INTRAMUSCULAR | Status: AC
  Administered 2018-07-26: 0.5 mg via INTRAVENOUS
  Filled 2018-07-26: qty 1

## 2018-07-26 MED ORDER — SODIUM CHLORIDE 0.9 % IV SOLN
INTRAVENOUS | Status: DC
  Administered 2018-07-26 – 2018-07-27 (×2): via INTRAVENOUS

## 2018-07-26 MED ORDER — SORBITOL 70 % SOLN
960.0000 mL | TOPICAL_OIL | Freq: Once | ORAL | Status: AC
  Administered 2018-07-27: 960 mL via RECTAL
  Filled 2018-07-26: qty 473

## 2018-07-26 MED ORDER — INSULIN ASPART 100 UNIT/ML ~~LOC~~ SOLN
0.0000 [IU] | Freq: Three times a day (TID) | SUBCUTANEOUS | Status: DC
  Administered 2018-07-27 (×2): 3 [IU] via SUBCUTANEOUS
  Administered 2018-07-28: 2 [IU] via SUBCUTANEOUS

## 2018-07-26 MED ORDER — ONDANSETRON HCL 4 MG PO TABS: 4.0000 mg | ORAL_TABLET | Freq: Four times a day (QID) | ORAL | Status: DC | PRN

## 2018-07-26 MED ORDER — ACETAMINOPHEN 650 MG RE SUPP: 650.0000 mg | Freq: Four times a day (QID) | RECTAL | Status: DC | PRN

## 2018-07-26 MED ORDER — ACETAMINOPHEN 325 MG PO TABS: 650.0000 mg | ORAL_TABLET | Freq: Four times a day (QID) | ORAL | Status: DC | PRN

## 2018-07-26 MED ORDER — OXYCODONE HCL 5 MG PO TABS
5.0000 mg | ORAL_TABLET | Freq: Three times a day (TID) | ORAL | Status: DC | PRN
  Administered 2018-07-27: 5 mg via ORAL
  Administered 2018-07-28: 10 mg via ORAL
  Filled 2018-07-26: qty 1
  Filled 2018-07-26: qty 2

## 2018-07-26 MED ORDER — POLYETHYLENE GLYCOL 3350 17 G PO PACK
17.0000 g | PACK | Freq: Two times a day (BID) | ORAL | Status: DC
  Administered 2018-07-26 – 2018-07-28 (×4): 17 g via ORAL
  Filled 2018-07-26 (×4): qty 1

## 2018-07-26 NOTE — ED Notes (Signed)
Pt placed on cardiac monitoring in the hallway. HR 90s a-fib. Pt stable at this time.

## 2018-07-26 NOTE — ED Notes (Signed)
Patient transported to CT 

## 2018-07-26 NOTE — H&P (Signed)
History and Physical    WINDSOR GOEKEN MVH:[id number] DOB: 1941/11/29 DOA: 07/26/2018  I have briefly reviewed the patient's prior medical records in [facility]  PCP: Mayra Neer, MD  Patient coming from: home  Chief Complaint: fall  HPI: Casey Gilmore is a 77 y.o. male with medical history significant of recent hip fracture, severe aortic stenosis, diastolic CHF, vocal cord cancer, memory loss comes in after a fall on Monday.  Patient is gaurded and not providing much information.  Per records he is wheelchair bound and fell Monday morning.  No loss of consciousness.  EMS thought he was in a fib and on RA he was 88%.  It was also reported he fell on Sunday and son was able to get him back to bed.  His home health PT came to assess today and he was in pain in lower back and not able to participate.    In the ER,   Review of Systems: As per HPI otherwise 10 point review of systems negative.   Past Medical History:  Diagnosis Date  . Anemia    2012  . Arthritis   . Blood transfusion   . Cancer ([place])    VOCAL CORD  . Closed fracture of left distal radius and ulna 01/19/2018  . Closed fracture of left proximal humerus 01/19/2018  . COPD (chronic obstructive pulmonary disease) ([place])   . Diabetes mellitus   . GERD (gastroesophageal reflux disease)   . Heart murmur   . Hiatal hernia   . Hyperlipidemia   . Hypertension   . NSTEMI (non-ST elevated myocardial infarction) ([place]) 07/2011   in setting of Pneumonia with normal coronary arteries on cath  . Pneumonia hx 8/12  . Severe aortic stenosis 2012   s/p bioprosthetic AVR  . Shortness of breath    with exertion  . Upper GI bleeding     Past Surgical History:  Procedure Laterality Date  . AORTIC VALVE REPLACEMENT  10/15/2011   Procedure: AORTIC VALVE REPLACEMENT (AVR);  Surgeon: Tharon Aquas Adelene Idler, MD;  Location: [facility];  Service: Open Heart Surgery;  Laterality: N/A;  . CARDIAC CATHETERIZATION  results on chart   2012 w no significant disease,echo with Severe Aortic stenosis,LVH,Diastolic dysfunction.   . COLONOSCOPY  12/2004  . EYE SURGERY  bil cat 08  . FRACTURE SURGERY  pin in lft hip  . JOINT REPLACEMENT  left knee 08, rt hip 08  . LARYNGOSCOPY  01/05/2013   Procedure: LARYNGOSCOPY;  Surgeon: Melida Quitter, MD;  Location: [facility];  Service: ENT;  Laterality: N/A;  micro direct laryngoscopy with biopsy, possible co2 laser, small laser-safe endotracheal tube  . LARYNGOSCOPY N/A 02/08/2013   Procedure: LARYNGOSCOPY;  Surgeon: Melida Quitter, MD;  Location: [facility];  Service: ENT;  Laterality: N/A;  Micro Direct Laryngoscopy with Co2 laser with re-excision right vocal cord lesion.  . OPEN REDUCTION INTERNAL FIXATION (ORIF) DISTAL RADIAL FRACTURE Left 01/19/2018   Procedure: OPEN REDUCTION INTERNAL FIXATION (ORIF) DISTAL RADIAL FRACTURE;  Surgeon: Marchia Bond, MD;  Location: [facility];  Service: Orthopedics;  Laterality: Left;  . ORIF FEMUR FRACTURE Left 04/18/2018   Procedure: OPEN REDUCTION INTERNAL FIXATION PROXIMAL FEMORAL SHAFT FRACTURE...REMOVAL OF HARDWARE LEFT FEMUR (HIP FUSION PLATE);  Surgeon: Altamese Watts, MD;  Location: [facility];  Service: Orthopedics;  Laterality: Left;  . ORIF HUMERUS FRACTURE Left 01/19/2018   Procedure: OPEN REDUCTION INTERNAL FIXATION (ORIF) PROXIMAL HUMERUS FRACTURE;  Surgeon: Marchia Bond, MD;  Location: [facility];  Service: Orthopedics;  Laterality: Left;  . TONSILLECTOMY    . TOTAL HIP ARTHROPLASTY Right   . TOTAL KNEE ARTHROPLASTY Left   . 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 His smoking use included cigarettes. He has a 50.00 pack-year smoking history. He has never used smokeless tobacco. He reports that he does not drink alcohol or use drugs.  No Known Allergies  Mother and father lived to be into their 48s  Prior to Admission medications   Medication Sig Start  Date End Date Taking? Authorizing Provider  HYDROcodone-acetaminophen (NORCO/VICODIN) 5-325 MG tablet Take 1 tablet by mouth as needed. 07/07/18  Yes [provider]  cyanocobalamin 2000 MCG tablet Take 4,000 mcg by mouth daily.     [provider]  enoxaparin (LOVENOX) 40 MG/0.4ML injection Inject 0.4 mLs (40 mg total) into the skin daily. 04/25/18   Caren Griffins, MD  ferrous sulfate 325 (65 FE) MG tablet Take 1 tablet (325 mg total) by mouth 2 (two) times daily with a meal. 04/24/18   Gherghe, Vella Redhead, MD  furosemide (LASIX) 40 MG tablet Take 40 mg by mouth daily.     [provider]  glimepiride (AMARYL) 1 MG tablet Take 1 tablet (1 mg total) by mouth daily with breakfast. 05/12/18   Angiulli, Lavon Paganini, PA-C  lidocaine (LIDODERM) 5 % Place 1 patch onto the skin daily. Remove & Discard patch within 12 hours or as directed by MD 05/12/18   Angiulli, Lavon Paganini, PA-C  metFORMIN (GLUCOPHAGE) 1000 MG tablet Take 1,000 mg by mouth 2 (two) times daily with a meal.    [provider]  methocarbamol (ROBAXIN) 500 MG tablet Take 1 tablet (500 mg total) by mouth every 6 (six) hours as needed for muscle spasms. 05/11/18   Angiulli, Lavon Paganini, PA-C  Multiple Vitamins-Minerals (MULTIVITAMIN ADULTS) TABS Take 1 tablet by mouth daily.    [provider]  oxyCODONE (OXY IR/ROXICODONE) 5 MG immediate release tablet Take 1-2 tablets (5-10 mg total) by mouth 3 (three) times daily as needed for moderate pain. 05/11/18   Angiulli, Lavon Paganini, PA-C  pantoprazole (PROTONIX) 40 MG tablet Take 1 tablet (40 mg total) by mouth daily. 05/12/18   Angiulli, Lavon Paganini, PA-C  polyethylene glycol (MIRALAX / GLYCOLAX) packet Take 17 g by mouth 2 (two) times daily. 05/11/18   Angiulli, Lavon Paganini, PA-C  pravastatin (PRAVACHOL) 20 MG tablet Take 20 mg by mouth daily. 11/08/17   [provider]  Vitamin D, Ergocalciferol, (DRISDOL) 50000 units CAPS capsule Take 1 capsule (50,000 Units total) by mouth  every 7 (seven) days. 05/14/18   Cathlyn Parsons, PA-C    Physical Exam: Vitals:   07/26/18 1019 07/26/18 1028 07/26/18 1031 07/26/18 1315  BP:   (!) 121/50 123/77  Pulse:  92 89 94  Resp:  18 (!) 22 16  Temp:  98.5 F (36.9 C) 98.1 F (36.7 C)   TempSrc:  Oral Oral   SpO2: (!) 88% 96% 96% 96%      Constitutional: frail appearing, uncomfortable Eyes: eyes injected ENMT: Mucous membranes are moist. Posterior pharynx clear of any exudate or lesions Neck: normal, supple, no masses, no thyromegaly Respiratory: clear to auscultation bilaterally, no wheezing, no crackles. Normal respiratory effort. No accessory muscle use.-- on [place] Cardiovascular: Regular rate and rhythm, no murmurs / rubs / gallops. No extremity edema. 2+  pedal pulses.  Abdomen: no tenderness, no masses palpated. Bowel sounds positive.  Musculoskeletal: no clubbing / cyanosis. Normal muscle tone.  Skin: no rashes, lesions, ulcers. No induration   Labs on Admission: I have personally reviewed following labs and imaging studies  CBC: Recent Labs  Lab 07/26/18 1154  WBC 15.1*  NEUTROABS 11.9*  HGB 9.8*  HCT 33.2*  MCV 78.1  PLT 213   Basic Metabolic Panel: Recent Labs  Lab 07/26/18 1154  NA 142  K 3.6  CL 105  CO2 29  GLUCOSE 106*  BUN 17  CREATININE 1.11  CALCIUM 9.0  MG 1.8  PHOS 2.4*   GFR: CrCl cannot be calculated (Unknown ideal weight.). Liver Function Tests: Recent Labs  Lab 07/26/18 1154  AST 14*  ALT 11  ALKPHOS 127*  BILITOT 0.8  PROT 6.3*  ALBUMIN 3.0*   Recent Labs  Lab 07/26/18 1154  LIPASE 21   No results for input(s): AMMONIA in the last 168 hours. Coagulation Profile: Recent Labs  Lab 07/26/18 1154  INR 1.05   Cardiac Enzymes: Recent Labs  Lab 07/26/18 1154  CKTOTAL 48*   BNP (last 3 results) No results for input(s): PROBNP in the last 8760 hours. HbA1C: No results for input(s): HGBA1C in the last 72 hours. CBG: No results for input(s): GLUCAP in the  last 168 hours. Lipid Profile: No results for input(s): CHOL, HDL, LDLCALC, TRIG, CHOLHDL, LDLDIRECT in the last 72 hours. Thyroid Function Tests: No results for input(s): TSH, T4TOTAL, FREET4, T3FREE, THYROIDAB in the last 72 hours. Anemia Panel: No results for input(s): VITAMINB12, FOLATE, FERRITIN, TIBC, IRON, RETICCTPCT in the last 72 hours. Urine analysis:    Component Value Date/Time   COLORURINE YELLOW 10/12/2011 [place] 10/12/2011 1353   LABSPEC 1.026 10/12/2011 1353   PHURINE 5.5 10/12/2011 1353   GLUCOSEU NEGATIVE 10/12/2011 1353   HGBUR NEGATIVE 10/12/2011 1353   BILIRUBINUR NEGATIVE 10/12/2011 1353   KETONESUR NEGATIVE 10/12/2011 1353   PROTEINUR NEGATIVE 10/12/2011 1353   UROBILINOGEN 1.0 10/12/2011 1353   NITRITE NEGATIVE 10/12/2011 1353   LEUKOCYTESUR NEGATIVE 10/12/2011 1353     Radiological Exams on Admission: Dg Chest 1 View  Result Date: 07/26/2018 CLINICAL DATA:  The patient has sustained a fall and is unable to sit erect for the lateral chest x-ray. No chest complaints. History of COPD, previous MI. EXAM: CHEST  1 VIEW COMPARISON:  Frontal chest x-ray of Apr 15, 2018. FINDINGS: The lungs are well-expanded. There is no focal infiltrate. There is no pleural effusion or pneumothorax. The heart is top-normal in size. The patient has undergone previous median sternotomy and aortic valve replacement. There is no pulmonary vascular congestion. The bony thorax exhibits no acute abnormality. IMPRESSION: COPD.  There is no active cardiopulmonary disease. Electronically Signed   By: David  Martinique M.D.   On: 07/26/2018 12:49   Ct Abdomen Pelvis W Contrast  Result Date: 07/26/2018 CLINICAL DATA:  Fall 2 days ago. Right hip and flank pain. Hypoxia. EXAM: CT ABDOMEN AND PELVIS WITH CONTRAST TECHNIQUE: Multidetector CT imaging of the abdomen and pelvis was performed using the standard protocol following bolus administration of intravenous contrast. CONTRAST:   161mL ISOVUE-300 IOPAMIDOL (ISOVUE-300) INJECTION 61% COMPARISON:  CT of the abdomen and pelvis 08/07/2011 FINDINGS: Lower chest: Bibasilar airspace disease likely reflects atelectasis. Lungs are otherwise clear. Coronary artery calcifications are present. The heart size is normal. Hepatobiliary: Layering hyperdensity likely represent small stones or sludge within the gallbladder. There is no  associated inflammation. The liver is within normal limits. The common bile duct is. Pancreas: Unremarkable. No pancreatic ductal dilatation or surrounding inflammatory changes. Spleen: No splenic injury or perisplenic hematoma. Adrenals/Urinary Tract: Adrenal glands are normal bilaterally. Kidneys and ureters are within normal limits. Stomach/Bowel: A small hiatal hernia is present. The stomach and duodenum are within normal limits. The small bowel is unremarkable. Terminal ileum is within normal limits. Appendix is visualized and normal. The ascending and transverse colon are normal normal. The descending colon is unremarkable. Sigmoid colon demonstrates diverticular changes. The rectum is dilated to 9 cm in transverse diameter without proximal obstruction. Vascular/Lymphatic: Atherosclerotic calcifications are present within the aorta and branch vessels without aneurysm. Reproductive: Prostate is unremarkable. Other: Fat herniates into the left inguinal canal without associated bowel. There is no free fluid. Musculoskeletal: The left hip is fused. Right hip replacement is noted. No acute fractures present. Soft tissue swelling is present lateral to the left hip and pelvis without underlying fracture. Superior endplate fractures at L4 and L5 are remote. A transitional S1 segment is present. Schmorl's nodes are present in the lower thoracic and upper lumbar spine. IMPRESSION: 1. Soft tissue swelling over the left hip and pelvis without underlying fracture. 2. The left hip is fused. 3. Moderate dilation of the rectum without  obstruction. 4.  Aortic Atherosclerosis (ICD10-I70.0). 5. Bilateral lower lobe airspace disease likely reflects atelectasis. 6. Coronary artery disease. 7. Sludge or layering stones in the gallbladder without evidence for cholecystitis. Electronically Signed   By: San Morelle M.D.   On: 07/26/2018 15:16    EKG: Independently reviewed. RBBB   Assessment/Plan Active Problems:   Back pain   Constipation/fecal impaction causing back pain? Rectum 9 cm dilated with what appears to be a large stool burden -enema -is on chronic pain medications and not ambulatory so will need bowel regimen  Leukocytosis -r/o infection-- await urine -incentive spirometry for atelectasis -no other symptoms of PNA like cough  ? A fib- not convinced -monitor on tele -would ne new diagnosis -EKG in AM  COPD -no wheezing  Chronic pain -will need bowel regimen -continue oxy  DM -SSI -hold PO meds   DVT prophylaxis: lovenox  Code Status: full  Family Communication: none at bedside Disposition Plan: pending Consults called:      Admission status: tele obs   At the point of initial evaluation, it is my clinical opinion that admission for OBSERVATION is reasonable and necessary because the patient's presenting complaints in the context of their chronic conditions represent sufficient risk of deterioration or significant morbidity to constitute reasonable grounds for close observation in the hospital setting, but that the patient may be medically stable for discharge from the hospital within 24 to 48 hours.     Geradine Girt [facility]   If 7PM-7AM, please contact night-coverage [email or web address] Password [facility]  07/26/2018, 4:31 PM

## 2018-07-26 NOTE — ED Provider Notes (Signed)
[facility] EMERGENCY DEPARTMENT Provider Note   CSN: [id number] Arrival date & time: 07/26/18  1009     History   Chief Complaint Chief Complaint  Patient presents with  . Fall  . Atrial Fibrillation    HPI Casey Gilmore is a 77 y.o. male.  HPI Patient has had several falls over the past couple of days.  His son checked on him on Sunday and found him down.  Patient did not have any pain complaints and he was able to get him back up and to bed.  He apparently also fell on Monday but again was back to baseline.  Today however his therapist came to check on him and the patient had a lot of pain in his right lower back and hip and could not do anything to participate in physical therapy.  Reportedly EMS has now also identified atrial fibrillation, no apparent known history.  Patient has history of COPD and reports he is always short of breath, no change.  He denies chest pain.  He denies headache.  Patient and his son reports that the left leg has always been shorter than the right and he has had a hip replacement.  That apparently is not a new finding. Past Medical History:  Diagnosis Date  . Anemia    2012  . Arthritis   . Blood transfusion   . 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 Closed fracture of left distal radius and ulna 01/19/2018  . Closed fracture of left proximal humerus 01/19/2018  . COPD (chronic obstructive pulmonary disease) ([place])   . Diabetes mellitus   . GERD (gastroesophageal reflux disease)   . Heart murmur   . Hiatal hernia   . Hyperlipidemia   . Hypertension   . NSTEMI (non-ST elevated myocardial infarction) ([place]) 07/2011   in setting of Pneumonia with normal coronary arteries on cath  . Pneumonia hx 8/12  . Severe aortic stenosis 2012   s/p bioprosthetic AVR  . Shortness of breath    with exertion  . Upper GI bleeding     Patient Active Problem List   Diagnosis Date Noted  . Back pain 07/26/2018  . Orthostatic hypotension   .  Chronic pain syndrome   . Constipation due to pain medication   . Postoperative pain   . Hypoalbuminemia due to protein-calorie malnutrition ([place])   . Closed left subtrochanteric femur fracture ([place]) 04/24/2018  . Acute blood loss anemia   . Anemia of chronic disease   . Diabetes mellitus type 2 in nonobese ([facility])   . Hyperlipidemia   . Aortic valve stenosis   . History of GI bleed   . Anemia 04/17/2018  . Closed left hip fracture ([place]) 04/15/2018  . Closed fracture of left proximal humerus 01/19/2018  . Closed fracture of left distal radius and ulna 01/19/2018  . 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 COPD (chronic obstructive pulmonary disease) with chronic bronchitis ([place]) 10/29/2014  . Chronic diastolic heart failure ([place]) 06/24/2014  . Benign essential HTN 06/24/2014  . 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 History of non-ST elevation myocardial infarction (NSTEMI) 06/24/2014  . Diabetes mellitus ([place]) 10/17/2011  . Osteoarthritis 10/17/2011    Past Surgical History:  Procedure Laterality Date  . AORTIC VALVE REPLACEMENT  10/15/2011   Procedure: AORTIC VALVE REPLACEMENT (AVR);  Surgeon: Tharon Aquas Adelene Idler, MD;  Location: [facility];  Service: Open Heart Surgery;  Laterality: N/A;  . CARDIAC CATHETERIZATION  results on chart   2012 w no significant disease,echo with Severe Aortic stenosis,LVH,Diastolic dysfunction.   . COLONOSCOPY  12/2004  . EYE SURGERY  bil cat 08  . FRACTURE SURGERY  pin in lft hip  . JOINT REPLACEMENT  left knee 08, rt hip 08  . LARYNGOSCOPY  01/05/2013   Procedure: LARYNGOSCOPY;  Surgeon: Melida Quitter, MD;  Location: [facility];  Service: ENT;  Laterality: N/A;  micro direct laryngoscopy with biopsy, possible co2 laser, small laser-safe endotracheal tube  . LARYNGOSCOPY N/A 02/08/2013   Procedure: LARYNGOSCOPY;  Surgeon: Melida Quitter, MD;  Location: [facility];  Service: ENT;  Laterality: N/A;  Micro Direct Laryngoscopy with Co2 laser with re-excision  right vocal cord lesion.  . OPEN REDUCTION INTERNAL FIXATION (ORIF) DISTAL RADIAL FRACTURE Left 01/19/2018   Procedure: OPEN REDUCTION INTERNAL FIXATION (ORIF) DISTAL RADIAL FRACTURE;  Surgeon: Marchia Bond, MD;  Location: [facility];  Service: Orthopedics;  Laterality: Left;  . ORIF FEMUR FRACTURE Left 04/18/2018   Procedure: OPEN REDUCTION INTERNAL FIXATION PROXIMAL FEMORAL SHAFT FRACTURE...REMOVAL OF HARDWARE LEFT FEMUR (HIP FUSION PLATE);  Surgeon: Altamese [place], MD;  Location: [facility];  Service: Orthopedics;  Laterality: Left;  . ORIF HUMERUS FRACTURE Left 01/19/2018   Procedure: OPEN REDUCTION INTERNAL FIXATION (ORIF) PROXIMAL HUMERUS FRACTURE;  Surgeon: Marchia Bond, MD;  Location: [facility];  Service: Orthopedics;  Laterality: Left;  . TONSILLECTOMY    . TOTAL HIP ARTHROPLASTY Right   . TOTAL KNEE ARTHROPLASTY Left   . 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 Authorizing Provider  HYDROcodone-acetaminophen (NORCO/VICODIN) 5-325 MG tablet Take 1 tablet by mouth as needed. 07/07/18  Yes [provider]  cyanocobalamin 2000 MCG tablet Take 4,000 mcg by mouth daily.     [provider]  enoxaparin (LOVENOX) 40 MG/0.4ML injection Inject 0.4 mLs (40 mg total) into the skin daily. 04/25/18   Caren Griffins, MD  ferrous sulfate 325 (65 FE) MG tablet Take 1 tablet (325 mg total) by mouth 2 (two) times daily with a meal. 04/24/18   Gherghe, Vella Redhead, MD  furosemide (LASIX) 40 MG tablet Take 40 mg by mouth daily.     [provider]  glimepiride (AMARYL) 1 MG tablet Take 1 tablet (1 mg total) by mouth daily with breakfast. 05/12/18   Angiulli, Lavon Paganini, PA-C  lidocaine (LIDODERM) 5 % Place 1 patch onto the skin daily. Remove & Discard patch within 12 hours or as directed by MD 05/12/18   Angiulli, Lavon Paganini, PA-C  metFORMIN  (GLUCOPHAGE) 1000 MG tablet Take 1,000 mg by mouth 2 (two) times daily with a meal.    [provider]  methocarbamol (ROBAXIN) 500 MG tablet Take 1 tablet (500 mg total) by mouth every 6 (six) hours as needed for muscle spasms. 05/11/18   Angiulli, Lavon Paganini, PA-C  Multiple Vitamins-Minerals (MULTIVITAMIN ADULTS) TABS Take 1 tablet by mouth daily.    [provider]  oxyCODONE (OXY IR/ROXICODONE) 5 MG immediate release tablet Take 1-2 tablets (5-10 mg total) by mouth 3 (three) times daily as needed for moderate pain. 05/11/18   Angiulli, Lavon Paganini, PA-C  pantoprazole (PROTONIX) 40 MG tablet Take 1 tablet (40 mg total) by mouth daily. 05/12/18   Angiulli, Lavon Paganini, PA-C  polyethylene glycol (MIRALAX / GLYCOLAX) packet Take 17 g by  mouth 2 (two) times daily. 05/11/18   Angiulli, Lavon Paganini, PA-C  pravastatin (PRAVACHOL) 20 MG tablet Take 20 mg by mouth daily. 11/08/17   [provider]  Vitamin D, Ergocalciferol, (DRISDOL) 50000 units CAPS capsule Take 1 capsule (50,000 Units total) by mouth every 7 (seven) days. 05/14/18   Angiulli, Lavon Paganini, PA-C    Family History No family history on file.  Social History Social History   Tobacco Use  . Smoking status: Former Smoker    Packs/day: 2.00    Years: 25.00    Pack years: 50.00    Types: Cigarettes    Last attempt to quit: 12/06/1978    Years since quitting: 39.6  . Smokeless tobacco: Never Used  Substance Use Topics  . Alcohol use: No  . Drug use: No     Allergies   Patient has no known allergies.   Review of Systems Review of Systems 10 Systems reviewed and are negative for acute change except as noted in the HPI.  Physical Exam Updated Vital Signs BP 123/77 (BP Location: Right Arm)   Pulse 94   Temp 98.1 F (36.7 C) (Oral)   Resp 16   SpO2 96%   Physical Exam  Constitutional:  Patient is alert and nontoxic.  No respiratory distress.  HENT:  Head: Normocephalic and atraumatic.  Mouth/Throat: Oropharynx is  clear and moist.  Eyes: EOM are normal.  Neck: Neck supple.  Cardiovascular:  Irregularly irregular.  No gross rub murmur gallop.  Pulmonary/Chest:  No active respiratory distress.  Airflow symmetric bilaterally.  Abdominal: Soft. He exhibits no distension. There is no tenderness. There is no guarding.  Musculoskeletal:  Patient endorses severe pain to palpation over the right trochanter and lateral flank area.  No visible severe bruise or hematoma.  Left leg is about 3 inches shortened relative to the right.  Reportedly this is chronic.  Neurological: He is alert. He exhibits normal muscle tone. Coordination normal.  Skin: Skin is warm and dry.     ED Treatments / Results  Labs (all labs ordered are listed, but only abnormal results are displayed) Labs Reviewed  COMPREHENSIVE METABOLIC PANEL - Abnormal; Notable for the following components:      Result Value   Glucose, Bld 106 (*)    Total Protein 6.3 (*)    Albumin 3.0 (*)    AST 14 (*)    Alkaline Phosphatase 127 (*)    All other components within normal limits  CBC WITH DIFFERENTIAL/PLATELET - Abnormal; Notable for the following components:   WBC 15.1 (*)    Hemoglobin 9.8 (*)    HCT 33.2 (*)    MCH 23.1 (*)    MCHC 29.5 (*)    RDW 16.5 (*)    Neutro Abs 11.9 (*)    Monocytes Absolute 1.1 (*)    All other components within normal limits  CK - Abnormal; Notable for the following components:   Total CK 48 (*)    All other components within normal limits  PHOSPHORUS - Abnormal; Notable for the following components:   Phosphorus 2.4 (*)    All other components within normal limits  URINE CULTURE  LIPASE, BLOOD  PROTIME-INR  ETHANOL  MAGNESIUM  URINALYSIS, ROUTINE W REFLEX MICROSCOPIC  RAPID URINE DRUG SCREEN, HOSP PERFORMED  I-STAT TROPONIN, ED  I-STAT CG4 LACTIC ACID, ED  I-STAT CG4 LACTIC ACID, ED    EKG EKG Interpretation  Date/Time:  Wednesday July 26 2018 10:30:39 EDT Ventricular Rate:  90 PR  Interval:    QRS Duration: 149 QT Interval:  400 QTC Calculation: 490 R Axis:   16 Text Interpretation:  Sinus rhythm Atrial premature complexes Right bundle branch block agree. no sig change from previous Confirmed by Charlesetta Shanks [id number]) on 07/26/2018 10:40:18 AM   Radiology Dg Chest 1 View  Result Date: 07/26/2018 CLINICAL DATA:  The patient has sustained a fall and is unable to sit erect for the lateral chest x-ray. No chest complaints. History of COPD, previous MI. EXAM: CHEST  1 VIEW COMPARISON:  Frontal chest x-ray of Apr 15, 2018. FINDINGS: The lungs are well-expanded. There is no focal infiltrate. There is no pleural effusion or pneumothorax. The heart is top-normal in size. The patient has undergone previous median sternotomy and aortic valve replacement. There is no pulmonary vascular congestion. The bony thorax exhibits no acute abnormality. IMPRESSION: COPD.  There is no active cardiopulmonary disease. Electronically Signed   By: David  Martinique M.D.   On: 07/26/2018 12:49   Ct Abdomen Pelvis W Contrast  Result Date: 07/26/2018 CLINICAL DATA:  Fall 2 days ago. Right hip and flank pain. Hypoxia. EXAM: CT ABDOMEN AND PELVIS WITH CONTRAST TECHNIQUE: Multidetector CT imaging of the abdomen and pelvis was performed using the standard protocol following bolus administration of intravenous contrast. CONTRAST:  159mL ISOVUE-300 IOPAMIDOL (ISOVUE-300) INJECTION 61% COMPARISON:  CT of the abdomen and pelvis 08/07/2011 FINDINGS: Lower chest: Bibasilar airspace disease likely reflects atelectasis. Lungs are otherwise clear. Coronary artery calcifications are present. The heart size is normal. Hepatobiliary: Layering hyperdensity likely represent small stones or sludge within the gallbladder. There is no associated inflammation. The liver is within normal limits. The common bile duct is. Pancreas: Unremarkable. No pancreatic ductal dilatation or surrounding inflammatory changes. Spleen: No splenic  injury or perisplenic hematoma. Adrenals/Urinary Tract: Adrenal glands are normal bilaterally. Kidneys and ureters are within normal limits. Stomach/Bowel: A small hiatal hernia is present. The stomach and duodenum are within normal limits. The small bowel is unremarkable. Terminal ileum is within normal limits. Appendix is visualized and normal. The ascending and transverse colon are normal normal. The descending colon is unremarkable. Sigmoid colon demonstrates diverticular changes. The rectum is dilated to 9 cm in transverse diameter without proximal obstruction. Vascular/Lymphatic: Atherosclerotic calcifications are present within the aorta and branch vessels without aneurysm. Reproductive: Prostate is unremarkable. Other: Fat herniates into the left inguinal canal without associated bowel. There is no free fluid. Musculoskeletal: The left hip is fused. Right hip replacement is noted. No acute fractures present. Soft tissue swelling is present lateral to the left hip and pelvis without underlying fracture. Superior endplate fractures at L4 and L5 are remote. A transitional S1 segment is present. Schmorl's nodes are present in the lower thoracic and upper lumbar spine. IMPRESSION: 1. Soft tissue swelling over the left hip and pelvis without underlying fracture. 2. The left hip is fused. 3. Moderate dilation of the rectum without obstruction. 4.  Aortic Atherosclerosis (ICD10-I70.0). 5. Bilateral lower lobe airspace disease likely reflects atelectasis. 6. Coronary artery disease. 7. Sludge or layering stones in the gallbladder without evidence for cholecystitis. Electronically Signed   By: San Morelle M.D.   On: 07/26/2018 15:16    Procedures Procedures (including critical care time)  Medications Ordered in ED Medications  iopamidol (ISOVUE-300) 61 % injection (has no administration in time range)  HYDROmorphone (DILAUDID) injection 0.5 mg (0.5 mg Intravenous Given 07/26/18 1324)  iopamidol  (ISOVUE-300) 61 % injection 100 mL (100 mLs Intravenous Contrast  Given 07/26/18 1454)     Initial Impression / Assessment and Plan / ED Course  I have reviewed the triage vital signs and the nursing notes.  Pertinent labs & imaging results that were available during my care of the patient were reviewed by me and considered in my medical decision making (see chart for details).     Consult: Tried hospitalist for admission. Final Clinical Impressions(s) / ED Diagnoses   Final diagnoses:  Frequent falls  Flank pain  Right hip pain  Leukocytosis, unspecified type  Patient has had fall with significant right-sided flank and hip pain.  He has had several falls over the past several days.  Patient has leukocytosis.  Differential includes indolent infectious etiology as underlying cause of weakness and failure of ADLs.  CT shows basilar atelectasis on the chest.  Patient reports that he has had coughing and wondered if maybe he had pneumonia.  Urinalysis pending.  Will need for completion of infectious work-up.  Also, EMS reports that the patient's EKG showed atrial fibrillation which was new.  I have reviewed his EKG in the emergency department and I feel it is most likely sinus rhythm with frequent PACs.  Nonetheless, patient will need admission for continued observation for weakness, falls with suspected infectious etiology as well as pain control.  ED Discharge Orders    None       Charlesetta Shanks, MD 07/26/18 [phone]

## 2018-07-26 NOTE — ED Notes (Signed)
Please call son Tywon Niday at [phone] with updates.

## 2018-07-26 NOTE — ED Triage Notes (Signed)
Pt here via [facility] from home after falling on Monday. Pt is wheelchair bound and states that he fell at around 0300 Monday morning. Hx of left hip fusion. EMS discovered new onset A-fib with controlled rate 60-105. RA O2 sat 88% 96% on 2L for this RN. Vital signs stable at this time.

## 2018-07-27 ENCOUNTER — Other Ambulatory Visit: Payer: Self-pay

## 2018-07-27 DIAGNOSIS — M545 Low back pain: Secondary | ICD-10-CM | POA: Diagnosis not present

## 2018-07-27 LAB — BASIC METABOLIC PANEL
ANION GAP: 7 (ref 5–15)
BUN: 16 mg/dL (ref 8–23)
CALCIUM: 8.7 mg/dL — AB (ref 8.9–10.3)
CHLORIDE: 108 mmol/L (ref 98–111)
CO2: 28 mmol/L (ref 22–32)
Creatinine, Ser: 1.05 mg/dL (ref 0.61–1.24)
GFR calc non Af Amer: >60 mL/min (ref >60)
Glucose, Bld: 73 mg/dL (ref 70–99)
Potassium: 3.2 mmol/L — ABNORMAL LOW (ref 3.5–5.1)
SODIUM: 143 mmol/L (ref 135–145)

## 2018-07-27 LAB — CBC
HCT: 29.6 % — ABNORMAL LOW (ref 39.0–52.0)
HEMOGLOBIN: 8.9 g/dL — AB (ref 13.0–17.0)
MCH: 23 pg — AB (ref 26.0–34.0)
MCHC: 30.1 g/dL (ref 30.0–36.0)
MCV: 76.5 fL — ABNORMAL LOW (ref 78.0–100.0)
Platelets: 282 10*3/uL (ref 150–400)
RBC: 3.87 MIL/uL — AB (ref 4.22–5.81)
RDW: 16.6 % — ABNORMAL HIGH (ref 11.5–15.5)
WBC: 16.4 10*3/uL — AB (ref 4.0–10.5)

## 2018-07-27 LAB — GLUCOSE, CAPILLARY
GLUCOSE-CAPILLARY: 64 mg/dL — AB (ref 70–99)
GLUCOSE-CAPILLARY: 236 mg/dL — AB (ref 70–99)
Glucose-Capillary: 130 mg/dL — ABNORMAL HIGH (ref 70–99)
Glucose-Capillary: 242 mg/dL — ABNORMAL HIGH (ref 70–99)
Glucose-Capillary: 136 mg/dL — ABNORMAL HIGH (ref 70–99)
Glucose-Capillary: 144 mg/dL — ABNORMAL HIGH (ref 70–99)

## 2018-07-27 MED ORDER — POTASSIUM CHLORIDE CRYS ER 20 MEQ PO TBCR
40.0000 meq | EXTENDED_RELEASE_TABLET | Freq: Once | ORAL | Status: AC
  Administered 2018-07-27: 40 meq via ORAL
  Filled 2018-07-27: qty 2

## 2018-07-27 MED ORDER — ENSURE ENLIVE PO LIQD
237.0000 mL | Freq: Two times a day (BID) | ORAL | Status: DC
  Administered 2018-07-27 – 2018-07-28 (×3): 237 mL via ORAL

## 2018-07-27 NOTE — Evaluation (Signed)
Physical Therapy Evaluation Patient Details Name: Casey Gilmore MRN: [id number] DOB: 01/11/1941 Today's Date: 07/27/2018   History of Present Illness  Pt is a 77 y/o male admitted secondary to fall and back pain. Imaging revealed soft tissue swelling of L hip and pelvis. PMH includes COPD, HTN, vocal cord cancer, dCHF, dementia, NSTEMI, R hip fx s/p ORIF, and s/p AVR.   Clinical Impression  Pt admitted secondary to problem above with deficits below. Pt very limited this session secondary to pain and required max A to perform basic bed mobility. Pt with R lateral lean in sitting as pt reports decreased ROM in L hip secondary to hip fusion; unsure of accuracy. Attempted to stand, however, pt only able to perform partial stand with max A from elevated surface. Feel pt is at increased risk for falls and will be unable to perform necessary mobility tasks. Will continue to follow acutely to maximize functional mobility independence and safety.     Follow Up Recommendations SNF;Supervision/Assistance - 24 hour    Equipment Recommendations  None recommended by PT    Recommendations for Other Services       Precautions / Restrictions Precautions Precautions: Fall Restrictions Weight Bearing Restrictions: No      Mobility  Bed Mobility Overal bed mobility: Needs Assistance Bed Mobility: Rolling;Sidelying to Sit;Sit to Supine Rolling: Max assist Sidelying to sit: Max assist   Sit to supine: Max assist   General bed mobility comments: Max A for LE assist and trunk elevation to come to sitting. Once in sitting pt with heavy R lateral lean secondary to reports of L hip fusion. Required max A for LE assist and trunk assist for return to supine. Pt very slow and guarded throughout mobility.   Transfers Overall transfer level: Needs assistance Equipment used: Rolling walker (2 wheeled) Transfers: Sit to/from Stand Sit to Stand: Max assist;From elevated surface         General  transfer comment: Attempted to stand from elevated surface height, however, only able to perform partial stand with max A secondary to pain.   Ambulation/Gait             General Gait Details: unable   Stairs            Wheelchair Mobility    Modified Rankin (Stroke Patients Only)       Balance Overall balance assessment: Needs assistance Sitting-balance support: Feet supported;Bilateral upper extremity supported Sitting balance-Leahy Scale: Poor Sitting balance - Comments: R lateral lean in sitting, secondary to pt reports of L hip fusion which limited ROM                                      Pertinent Vitals/Pain Pain Assessment: Faces Faces Pain Scale: Hurts even more Pain Location: R hip  Pain Descriptors / Indicators: Aching;Grimacing Pain Intervention(s): Limited activity within patient's tolerance;Monitored during session;Repositioned    Home Living Family/patient expects to be discharged to:: Private residence Living Arrangements: Alone Available Help at Discharge: Family;Available PRN/intermittently Type of Home: Mobile home Home Access: Stairs to enter Entrance Stairs-Rails: Chemical engineer of Steps: 4 Home Layout: One level Home Equipment: Cane - single point;Walker - 2 wheels      Prior Function Level of Independence: Needs assistance   Gait / Transfers Assistance Needed: Reports he was using RW for ambulation, however, then reports he needed assist to stand. Pt unreliable source.  ADL's / Homemaking Assistance Needed: Pt reports initially he was independent, however, then reports neighbors come to help him.         Hand Dominance        Extremity/Trunk Assessment   Upper Extremity Assessment Upper Extremity Assessment: Defer to OT evaluation    Lower Extremity Assessment Lower Extremity Assessment: RLE deficits/detail;LLE deficits/detail RLE Deficits / Details: Reports hip pain which limited ROM.  Recent R hip fracture s/p ORIF.  LLE Deficits / Details: Reports L hip fusion, however, unsure of accuracy. Pt would not sit up fully upright secondary to limited ROM at hip.     Cervical / Trunk Assessment Cervical / Trunk Assessment: Other exceptions Cervical / Trunk Exceptions: back pain from fall   Communication   Communication: No difficulties  Cognition Arousal/Alertness: Awake/alert Behavior During Therapy: WFL for tasks assessed/performed Overall Cognitive Status: No family/caregiver present to determine baseline cognitive functioning                                 General Comments: Per notes, history of dementia, however, unsure if pt at baseline.       General Comments      Exercises     Assessment/Plan    PT Assessment Patient needs continued PT services  PT Problem List Decreased strength;Decreased activity tolerance;Decreased balance;Decreased mobility;Decreased cognition;Decreased knowledge of precautions;Decreased safety awareness;Decreased knowledge of use of DME;Pain       PT Treatment Interventions DME instruction;Gait training;Functional mobility training;Therapeutic activities;Therapeutic exercise;Balance training;Patient/family education    PT Goals (Current goals can be found in the Care Plan section)  Acute Rehab PT Goals Patient Stated Goal: none stated  PT Goal Formulation: With patient Time For Goal Achievement: 08/10/18 Potential to Achieve Goals: Fair    Frequency Min 2X/week   Barriers to discharge Decreased caregiver support      Co-evaluation               AM-PAC PT "6 Clicks" Daily Activity  Outcome Measure Difficulty turning over in bed (including adjusting bedclothes, sheets and blankets)?: Unable Difficulty moving from lying on back to sitting on the side of the bed? : Unable Difficulty sitting down on and standing up from a chair with arms (e.g., wheelchair, bedside commode, etc,.)?: Unable Help needed moving  to and from a bed to chair (including a wheelchair)?: Total Help needed walking in hospital room?: Total Help needed climbing 3-5 steps with a railing? : Total 6 Click Score: 6    End of Session Equipment Utilized During Treatment: Gait belt Activity Tolerance: Patient limited by pain Patient left: in bed;with call bell/phone within reach;with bed alarm set Nurse Communication: Mobility status PT Visit Diagnosis: Difficulty in walking, not elsewhere classified (R26.2);Unsteadiness on feet (R26.81);Repeated falls (R29.6);History of falling (Z91.81);Muscle weakness (generalized) (M62.81);Pain Pain - Right/Left: Right Pain - part of body: Hip    Time: 7824-2353 PT Time Calculation (min) (ACUTE ONLY): 27 min   Charges:   PT Evaluation $PT Eval Moderate Complexity: 1 Mod PT Treatments $Therapeutic Activity: 8-22 mins        Leighton Ruff, PT, DPT  Acute Rehabilitation Services  Pager: [phone]   Rudean Hitt 07/27/2018, 1:29 PM

## 2018-07-27 NOTE — Progress Notes (Addendum)
PROGRESS NOTE    Casey Gilmore  JYN:[id number] DOB: [birth year] DOA: 07/26/2018 PCP: Mayra Neer, MD   Brief Narrative: Patient is a 77 year old male with past medical history of recent hip fracture, frequent falls, severe aortic stenosis, diastolic CHF, and surgery of the vocal cord, dementia who was brought to the emergency department after he fell at his home.  Patient lives by himself.  Patient follows with physical therapy.  He was tachycardic when EMS arrived and was thought to be in A. fib but currently he is on  normal sinus rhythm.  Also found to be severely constipated on presentation which has resolved.  Ordering PT/OT today.  Assessment & Plan:   Active Problems:   Osteoarthritis   COPD (chronic obstructive pulmonary disease) with chronic bronchitis (HCC)   Back pain   Frequent falls   Leukocytosis  Frequent falls: Fell at home.  Complains of generalized weakness and back pain.  Follows with PT as an outpatient.  Will order PT/OT because it was not ordered on admission.. Patient recently had a fall and fractured his right hip.  Back pain: Chronic issue.  Continue supportive care and pain management.  Constipation: Most likely secondary to opiates for back pain.  Had a bowel movement after being admitted.  Denies any abdominal discomfort.  Continue bowel regimen.  Tachycardia/suspected A. fib: Currently he is normal sinus rhythm.  No history of A. fib in the past.  No need for any intervention.  COPD: Currently his lungs are clear.  Continue PRN nebulization.  Leukocytosis: Most likely reactive.  No clear source of infection.  Urinalysis was not suggestive of UTI.  Chest x-ray did not show any pneumonia.  Diabetes mellitus type 2: Continue sliding scale insulin for now.  Anemia: Looks like chronic anemia.  His baseline hemoglobin ranges from 8-9.  Currently H&H stable.  Hypokalemia: Supplemented  DVT prophylaxis: Lovenox Code Status: Full Family Communication:  None present at the bedside Disposition Plan: Pending PT/OT evaluation   Consultants: None  Procedures:None  Antimicrobials: None  Subjective: Patient seen and examined the bedside this afternoon.  Remains comfortable.  Complains of generalized weakness and back pain.  Says that he is not able to walk.  Objective: Vitals:   07/26/18 1835 07/26/18 2041 07/26/18 2044 07/27/18 0445  BP: 140/83 137/76  (!) 157/91  Pulse: 95 91  98  Resp: 16 16  16   Temp:  (!) 100.7 F (38.2 C)  98.1 F (36.7 C)  TempSrc:  Oral  Oral  SpO2: 95% (!) 89% 93% 94%  Weight:  73.7 kg      Intake/Output Summary (Last 24 hours) at 07/27/2018 1222 Last data filed at 07/27/2018 0900 Gross per 24 hour  Intake 518.42 ml  Output 400 ml  Net 118.42 ml   Filed Weights   07/26/18 2041  Weight: 73.7 kg    Examination:  General exam: Generalized weakness,thin HEENT:PERRL,Oral mucosa moist, Ear/Nose normal on gross exam Respiratory system: Bilateral equal air entry, normal vesicular breath sounds, no wheezes or crackles  Cardiovascular system: S1 & S2 heard, RRR. No JVD, murmurs, rubs, gallops or clicks. No pedal edema. Gastrointestinal system: Abdomen is nondistended, soft and nontender. No organomegaly or masses felt. Normal bowel sounds heard. Central nervous system: Alert and oriented. No focal neurological deficits. Extremities: No edema, no clubbing ,no cyanosis, distal peripheral pulses palpable. Skin: No rashes, lesions or ulcers,no icterus ,no pallor   Data Reviewed: I have personally reviewed following labs and imaging studies  CBC:  Recent Labs  Lab 07/26/18 1154 07/27/18 0453  WBC 15.1* 16.4*  NEUTROABS 11.9*  --   HGB 9.8* 8.9*  HCT 33.2* 29.6*  MCV 78.1 76.5*  PLT 247 102   Basic Metabolic Panel: Recent Labs  Lab 07/26/18 1154 07/27/18 0453  NA 142 143  K 3.6 3.2*  CL 105 108  CO2 29 28  GLUCOSE 106* 73  BUN 17 16  CREATININE 1.11 1.05  CALCIUM 9.0 8.7*  MG 1.8  --     PHOS 2.4*  --    GFR: Estimated Creatinine Clearance: 61.4 mL/min (by C-G formula based on SCr of 1.05 mg/dL). Liver Function Tests: Recent Labs  Lab 07/26/18 1154  AST 14*  ALT 11  ALKPHOS 127*  BILITOT 0.8  PROT 6.3*  ALBUMIN 3.0*   Recent Labs  Lab 07/26/18 1154  LIPASE 21   No results for input(s): AMMONIA in the last 168 hours. Coagulation Profile: Recent Labs  Lab 07/26/18 1154  INR 1.05   Cardiac Enzymes: Recent Labs  Lab 07/26/18 1154  CKTOTAL 48*   BNP (last 3 results) No results for input(s): PROBNP in the last 8760 hours. HbA1C: No results for input(s): HGBA1C in the last 72 hours. CBG: Recent Labs  Lab 07/26/18 2105 07/26/18 2301 07/27/18 0742 07/27/18 0900 07/27/18 1137  GLUCAP 64* 76 64* 130* 236*   Lipid Profile: No results for input(s): CHOL, HDL, LDLCALC, TRIG, CHOLHDL, LDLDIRECT in the last 72 hours. Thyroid Function Tests: No results for input(s): TSH, T4TOTAL, FREET4, T3FREE, THYROIDAB in the last 72 hours. Anemia Panel: No results for input(s): VITAMINB12, FOLATE, FERRITIN, TIBC, IRON, RETICCTPCT in the last 72 hours. Sepsis Labs: Recent Labs  Lab 07/26/18 1237  LATICACIDVEN 1.40    No results found for this or any previous visit (from the past 240 hour(s)).       Radiology Studies: Dg Chest 1 View  Result Date: 07/26/2018 CLINICAL DATA:  The patient has sustained a fall and is unable to sit erect for the lateral chest x-ray. No chest complaints. History of COPD, previous MI. EXAM: CHEST  1 VIEW COMPARISON:  Frontal chest x-ray of Apr 15, 2018. FINDINGS: The lungs are well-expanded. There is no focal infiltrate. There is no pleural effusion or pneumothorax. The heart is top-normal in size. The patient has undergone previous median sternotomy and aortic valve replacement. There is no pulmonary vascular congestion. The bony thorax exhibits no acute abnormality. IMPRESSION: COPD.  There is no active cardiopulmonary disease.  Electronically Signed   By: David  Martinique M.D.   On: 07/26/2018 12:49   Ct Abdomen Pelvis W Contrast  Result Date: 07/26/2018 CLINICAL DATA:  Fall 2 days ago. Right hip and flank pain. Hypoxia. EXAM: CT ABDOMEN AND PELVIS WITH CONTRAST TECHNIQUE: Multidetector CT imaging of the abdomen and pelvis was performed using the standard protocol following bolus administration of intravenous contrast. CONTRAST:  168mL ISOVUE-300 IOPAMIDOL (ISOVUE-300) INJECTION 61% COMPARISON:  CT of the abdomen and pelvis 08/07/2011 FINDINGS: Lower chest: Bibasilar airspace disease likely reflects atelectasis. Lungs are otherwise clear. Coronary artery calcifications are present. The heart size is normal. Hepatobiliary: Layering hyperdensity likely represent small stones or sludge within the gallbladder. There is no associated inflammation. The liver is within normal limits. The common bile duct is. Pancreas: Unremarkable. No pancreatic ductal dilatation or surrounding inflammatory changes. Spleen: No splenic injury or perisplenic hematoma. Adrenals/Urinary Tract: Adrenal glands are normal bilaterally. Kidneys and ureters are within normal limits. Stomach/Bowel: A small hiatal hernia is  present. The stomach and duodenum are within normal limits. The small bowel is unremarkable. Terminal ileum is within normal limits. Appendix is visualized and normal. The ascending and transverse colon are normal normal. The descending colon is unremarkable. Sigmoid colon demonstrates diverticular changes. The rectum is dilated to 9 cm in transverse diameter without proximal obstruction. Vascular/Lymphatic: Atherosclerotic calcifications are present within the aorta and branch vessels without aneurysm. Reproductive: Prostate is unremarkable. Other: Fat herniates into the left inguinal canal without associated bowel. There is no free fluid. Musculoskeletal: The left hip is fused. Right hip replacement is noted. No acute fractures present. Soft tissue  swelling is present lateral to the left hip and pelvis without underlying fracture. Superior endplate fractures at L4 and L5 are remote. A transitional S1 segment is present. Schmorl's nodes are present in the lower thoracic and upper lumbar spine. IMPRESSION: 1. Soft tissue swelling over the left hip and pelvis without underlying fracture. 2. The left hip is fused. 3. Moderate dilation of the rectum without obstruction. 4.  Aortic Atherosclerosis (ICD10-I70.0). 5. Bilateral lower lobe airspace disease likely reflects atelectasis. 6. Coronary artery disease. 7. Sludge or layering stones in the gallbladder without evidence for cholecystitis. Electronically Signed   By: San Morelle M.D.   On: 07/26/2018 15:16        Scheduled Meds: . enoxaparin (LOVENOX) injection  40 mg Subcutaneous Q24H  . feeding supplement (ENSURE ENLIVE)  237 mL Oral BID BM  . insulin aspart  0-5 Units Subcutaneous QHS  . insulin aspart  0-9 Units Subcutaneous TID WC  . lidocaine  1 patch Transdermal Q24H  . pantoprazole  40 mg Oral Daily  . polyethylene glycol  17 g Oral BID  . cyanocobalamin  4,000 mcg Oral Daily   Continuous Infusions: . sodium chloride 50 mL/hr at 07/27/18 0500     LOS: 0 days    Time spent: 25 mins.More than 50% of that time was spent in counseling and/or coordination of care.      Shelly Coss, MD [facility] Pager [phone]  If 7PM-7AM, please contact night-coverage [email or web address] Password [facility] 07/27/2018, 12:22 PM

## 2018-07-27 NOTE — Plan of Care (Signed)

## 2018-07-27 NOTE — Evaluation (Signed)
Occupational Therapy Evaluation Patient Details Name: Casey Gilmore MRN: [id number] DOB: Feb 01, 1941 Today's Date: 07/27/2018    History of Present Illness Pt is a 77 y/o male admitted secondary to fall and back pain. Imaging revealed soft tissue swelling of L hip and pelvis. PMH includes COPD, HTN, vocal cord cancer, dCHF, dementia, NSTEMI, R hip fx s/p ORIF, and s/p AVR.    Clinical Impression   Pt admitted with above dx and now presenting with decreased sitting/standing balance, reduced cardiorespiratory support/endurance, and generalized weakness that impacts his ability to perform ADLs with PLOF. Pt is also limited by prior L hip replacement/fusion that he has difficulty flexing functionally during transfers. Pt was receiving assistance from his son and neighbors, but lives alone in an inaccessible home. Pt required max +2 A to transition sit <>stand with RW, with vc for UE placement. Pt would benefit from SNF placement for rehab to ensure safe d/c.     Follow Up Recommendations  SNF;Supervision/Assistance - 24 hour    Equipment Recommendations  None recommended by OT    Recommendations for Other Services PT consult;Speech consult     Precautions / Restrictions Precautions Precautions: Fall Restrictions Weight Bearing Restrictions: No      Mobility Bed Mobility Overal bed mobility: Needs Assistance Bed Mobility: Rolling;Sidelying to Sit;Sit to Supine Rolling: Max assist Sidelying to sit: Max assist   Sit to supine: Max assist   General bed mobility comments: Max A for LE assist and trunk elevation to come to sitting. Once in sitting pt with heavy R lateral lean secondary to reports of L hip fusion. Required max A for LE assist and trunk assist for return to supine. Pt very slow and guarded throughout mobility.   Transfers Overall transfer level: Needs assistance Equipment used: Rolling walker (2 wheeled) Transfers: Sit to/from Stand Sit to Stand: Max assist;From  elevated surface;+2 physical assistance         General transfer comment: pt unable to come fully upright. Pt required A for bringing L LE under him    Balance Overall balance assessment: Needs assistance Sitting-balance support: Feet supported;Single extremity supported Sitting balance-Leahy Scale: Poor Sitting balance - Comments: R lateral lean in sitting, secondary to pt reports of L hip fusion which limited ROM  Postural control: Right lateral lean;Posterior lean Standing balance support: During functional activity;Bilateral upper extremity supported Standing balance-Leahy Scale: Zero                             ADL either performed or assessed with clinical judgement   ADL Overall ADL's : Needs assistance/impaired     Grooming: Wash/dry face;Set up;Bed level   Upper Body Bathing: Sitting;Cueing for safety;Minimal assistance Upper Body Bathing Details (indicate cue type and reason): min A sitting EOB Lower Body Bathing: Sitting/lateral leans;Moderate assistance Lower Body Bathing Details (indicate cue type and reason): with both sit <> stand and lateral leans Upper Body Dressing : Min guard;Sitting   Lower Body Dressing: Maximal assistance;Sit to/from stand               Functional mobility during ADLs: Maximal assistance;+2 for physical assistance;Rolling walker;Cueing for safety;Cueing for sequencing       Vision         Perception     Praxis      Pertinent Vitals/Pain Pain Assessment: Faces Faces Pain Scale: Hurts even more Pain Location: R hip  Pain Descriptors / Indicators: Aching;Grimacing Pain Intervention(s): Monitored during  session;Limited activity within patient's tolerance     Hand Dominance Right   Extremity/Trunk Assessment Upper Extremity Assessment Upper Extremity Assessment: Generalized weakness   Lower Extremity Assessment Lower Extremity Assessment: Defer to PT evaluation RLE Deficits / Details: Reports hip pain  which limited ROM. Recent R hip fracture s/p ORIF.  LLE Deficits / Details: Pt's L hip replaced as a child, very limited forward flexion, knee flexion. Neighbor in room confirmed pt has walked "with leg totally straight" for his whole life   Cervical / Trunk Assessment Cervical / Trunk Assessment: Other exceptions Cervical / Trunk Exceptions: back pain from fall    Communication Communication Communication: No difficulties   Cognition Arousal/Alertness: Awake/alert Behavior During Therapy: WFL for tasks assessed/performed Overall Cognitive Status: No family/caregiver present to determine baseline cognitive functioning                                 General Comments: Per notes, history of dementia, however, unsure if pt at baseline.    General Comments       Exercises     Shoulder Instructions      Home Living Family/patient expects to be discharged to:: Private residence Living Arrangements: Alone Available Help at Discharge: Family;Available PRN/intermittently Type of Home: Mobile home Home Access: Stairs to enter Entrance Stairs-Number of Steps: 4 Entrance Stairs-Rails: Left;Right Home Layout: One level     Bathroom Shower/Tub: Occupational psychologist: Handicapped height     Home Equipment: [place] - single point;Walker - 2 wheels          Prior Functioning/Environment Level of Independence: Needs assistance  Gait / Transfers Assistance Needed: Unable to fully assess pt PLOF ADL's / Homemaking Assistance Needed: Unable to fully assess pt PLOF d/t poor historian            OT Problem List: Decreased strength;Decreased range of motion;Decreased activity tolerance;Impaired balance (sitting and/or standing);Decreased coordination;Cardiopulmonary status limiting activity;Pain;Decreased safety awareness;Decreased knowledge of use of DME or AE      OT Treatment/Interventions: Self-care/ADL training;Therapeutic exercise;Energy conservation;DME  and/or AE instruction;Therapeutic activities;Balance training;Patient/family education    OT Goals(Current goals can be found in the care plan section) Acute Rehab OT Goals Patient Stated Goal: none stated  OT Goal Formulation: With patient Time For Goal Achievement: 08/04/18 Potential to Achieve Goals: Fair  OT Frequency: Min 2X/week   Barriers to D/C: Decreased caregiver support          Co-evaluation              AM-PAC PT "6 Clicks" Daily Activity     Outcome Measure Help from another person eating meals?: None Help from another person taking care of personal grooming?: A Little Help from another person toileting, which includes using toliet, bedpan, or urinal?: Total Help from another person bathing (including washing, rinsing, drying)?: A Lot Help from another person to put on and taking off regular upper body clothing?: A Little Help from another person to put on and taking off regular lower body clothing?: A Lot 6 Click Score: 15   End of Session Equipment Utilized During Treatment: Gait belt;Rolling walker Nurse Communication: Mobility status  Activity Tolerance: Patient limited by pain Patient left: in bed;with family/visitor present;with call bell/phone within reach  OT Visit Diagnosis: Repeated falls (R29.6);Muscle weakness (generalized) (M62.81);Unsteadiness on feet (R26.81)                Time: 6237-6283 OT  Time Calculation (min): 50 min Charges:  OT General Charges $OT Visit: 1 Visit OT Evaluation $OT Eval Moderate Complexity: 1 Mod OT Treatments $Self Care/Home Management : 23-37 mins  Curtis Sites OTR/L 07/27/2018, 3:55 PM

## 2018-07-28 DIAGNOSIS — Z96641 Presence of right artificial hip joint: Secondary | ICD-10-CM | POA: Diagnosis not present

## 2018-07-28 DIAGNOSIS — I110 Hypertensive heart disease with heart failure: Secondary | ICD-10-CM | POA: Diagnosis not present

## 2018-07-28 DIAGNOSIS — D638 Anemia in other chronic diseases classified elsewhere: Secondary | ICD-10-CM | POA: Diagnosis not present

## 2018-07-28 DIAGNOSIS — Z96642 Presence of left artificial hip joint: Secondary | ICD-10-CM | POA: Diagnosis not present

## 2018-07-28 DIAGNOSIS — R296 Repeated falls: Secondary | ICD-10-CM | POA: Diagnosis not present

## 2018-07-28 DIAGNOSIS — M2550 Pain in unspecified joint: Secondary | ICD-10-CM | POA: Diagnosis not present

## 2018-07-28 DIAGNOSIS — I509 Heart failure, unspecified: Secondary | ICD-10-CM | POA: Diagnosis not present

## 2018-07-28 DIAGNOSIS — R2689 Other abnormalities of gait and mobility: Secondary | ICD-10-CM | POA: Diagnosis not present

## 2018-07-28 DIAGNOSIS — E876 Hypokalemia: Secondary | ICD-10-CM | POA: Diagnosis not present

## 2018-07-28 DIAGNOSIS — Z953 Presence of xenogenic heart valve: Secondary | ICD-10-CM | POA: Diagnosis not present

## 2018-07-28 DIAGNOSIS — R2681 Unsteadiness on feet: Secondary | ICD-10-CM | POA: Diagnosis not present

## 2018-07-28 DIAGNOSIS — J441 Chronic obstructive pulmonary disease with (acute) exacerbation: Secondary | ICD-10-CM | POA: Diagnosis not present

## 2018-07-28 DIAGNOSIS — M1991 Primary osteoarthritis, unspecified site: Secondary | ICD-10-CM | POA: Diagnosis not present

## 2018-07-28 DIAGNOSIS — M6281 Muscle weakness (generalized): Secondary | ICD-10-CM | POA: Diagnosis not present

## 2018-07-28 DIAGNOSIS — R000 Tachycardia, unspecified: Secondary | ICD-10-CM | POA: Diagnosis not present

## 2018-07-28 DIAGNOSIS — D649 Anemia, unspecified: Secondary | ICD-10-CM | POA: Diagnosis not present

## 2018-07-28 DIAGNOSIS — K5900 Constipation, unspecified: Secondary | ICD-10-CM | POA: Diagnosis not present

## 2018-07-28 DIAGNOSIS — Z7401 Bed confinement status: Secondary | ICD-10-CM | POA: Diagnosis not present

## 2018-07-28 DIAGNOSIS — M545 Low back pain: Secondary | ICD-10-CM | POA: Diagnosis not present

## 2018-07-28 DIAGNOSIS — I5032 Chronic diastolic (congestive) heart failure: Secondary | ICD-10-CM | POA: Diagnosis not present

## 2018-07-28 DIAGNOSIS — R41841 Cognitive communication deficit: Secondary | ICD-10-CM | POA: Diagnosis not present

## 2018-07-28 DIAGNOSIS — R531 Weakness: Secondary | ICD-10-CM | POA: Diagnosis not present

## 2018-07-28 DIAGNOSIS — I5031 Acute diastolic (congestive) heart failure: Secondary | ICD-10-CM | POA: Diagnosis not present

## 2018-07-28 DIAGNOSIS — E119 Type 2 diabetes mellitus without complications: Secondary | ICD-10-CM | POA: Diagnosis not present

## 2018-07-28 DIAGNOSIS — M25551 Pain in right hip: Secondary | ICD-10-CM | POA: Diagnosis not present

## 2018-07-28 DIAGNOSIS — J449 Chronic obstructive pulmonary disease, unspecified: Secondary | ICD-10-CM | POA: Diagnosis not present

## 2018-07-28 DIAGNOSIS — M25552 Pain in left hip: Secondary | ICD-10-CM | POA: Diagnosis not present

## 2018-07-28 DIAGNOSIS — Z4789 Encounter for other orthopedic aftercare: Secondary | ICD-10-CM | POA: Diagnosis not present

## 2018-07-28 DIAGNOSIS — Z9181 History of falling: Secondary | ICD-10-CM | POA: Diagnosis not present

## 2018-07-28 DIAGNOSIS — M549 Dorsalgia, unspecified: Secondary | ICD-10-CM | POA: Diagnosis not present

## 2018-07-28 DIAGNOSIS — D72829 Elevated white blood cell count, unspecified: Secondary | ICD-10-CM | POA: Diagnosis not present

## 2018-07-28 DIAGNOSIS — R1312 Dysphagia, oropharyngeal phase: Secondary | ICD-10-CM | POA: Diagnosis not present

## 2018-07-28 DIAGNOSIS — D509 Iron deficiency anemia, unspecified: Secondary | ICD-10-CM | POA: Diagnosis not present

## 2018-07-28 LAB — CBC WITH DIFFERENTIAL/PLATELET
Abs Immature Granulocytes: 0 10*3/uL (ref 0.0–0.1)
BASOS ABS: 0 10*3/uL (ref 0.0–0.1)
Basophils Relative: 1 %
EOS ABS: 0 10*3/uL (ref 0.0–0.7)
EOS PCT: 0 %
HCT: 26.8 % — ABNORMAL LOW (ref 39.0–52.0)
HEMATOCRIT: 24.5 % — AB (ref 39.0–52.0)
HEMOGLOBIN: 8.2 g/dL — AB (ref 13.0–17.0)
HEMOGLOBIN: 7.4 g/dL — AB (ref 13.0–17.0)
Immature Granulocytes: 1 %
LYMPHS ABS: 1.2 10*3/uL (ref 0.7–4.0)
LYMPHS PCT: 15 %
MCH: 22.8 pg — AB (ref 26.0–34.0)
MCH: 23.1 pg — AB (ref 26.0–34.0)
MCHC: 30.6 g/dL (ref 30.0–36.0)
MCHC: 30.2 g/dL (ref 30.0–36.0)
MCV: 74.4 fL — AB (ref 78.0–100.0)
MCV: 76.3 fL — ABNORMAL LOW (ref 78.0–100.0)
MONO ABS: 0.8 10*3/uL (ref 0.1–1.0)
Monocytes Relative: 10 %
Neutro Abs: 5.9 10*3/uL (ref 1.7–7.7)
Neutrophils Relative %: 75 %
Platelets: 221 10*3/uL (ref 150–400)
RBC: 3.6 MIL/uL — ABNORMAL LOW (ref 4.22–5.81)
RBC: 3.21 MIL/uL — ABNORMAL LOW (ref 4.22–5.81)
RDW: 16.5 % — AB (ref 11.5–15.5)
RDW: 16.5 % — AB (ref 11.5–15.5)
WBC: 7.9 10*3/uL (ref 4.0–10.5)

## 2018-07-28 LAB — GLUCOSE, CAPILLARY
GLUCOSE-CAPILLARY: 131 mg/dL — AB (ref 70–99)
Glucose-Capillary: 118 mg/dL — ABNORMAL HIGH (ref 70–99)
Glucose-Capillary: 167 mg/dL — ABNORMAL HIGH (ref 70–99)

## 2018-07-28 LAB — BASIC METABOLIC PANEL
ANION GAP: 8 (ref 5–15)
BUN: 19 mg/dL (ref 8–23)
CALCIUM: 8.3 mg/dL — AB (ref 8.9–10.3)
CO2: 24 mmol/L (ref 22–32)
CREATININE: 1.01 mg/dL (ref 0.61–1.24)
Chloride: 107 mmol/L (ref 98–111)
Glucose, Bld: 145 mg/dL — ABNORMAL HIGH (ref 70–99)
Potassium: 3.7 mmol/L (ref 3.5–5.1)
Sodium: 139 mmol/L (ref 135–145)

## 2018-07-28 MED ORDER — FERROUS SULFATE 325 (65 FE) MG PO TABS: 325.0000 mg | ORAL_TABLET | Freq: Every day | ORAL | Status: DC

## 2018-07-28 MED ORDER — OXYCODONE HCL 5 MG PO TABS: 5.0000 mg | ORAL_TABLET | Freq: Four times a day (QID) | ORAL | 0 refills | Status: DC | PRN

## 2018-07-28 MED ORDER — LIDOCAINE 5 % EX PTCH: 1.0000 | MEDICATED_PATCH | CUTANEOUS | 0 refills | Status: DC

## 2018-07-28 MED ORDER — FERROUS SULFATE 325 (65 FE) MG PO TABS: 325.0000 mg | ORAL_TABLET | Freq: Every day | ORAL | 0 refills | Status: AC

## 2018-07-28 MED ORDER — POLYETHYLENE GLYCOL 3350 17 G PO PACK: 17.0000 g | PACK | Freq: Two times a day (BID) | ORAL | 0 refills | Status: AC

## 2018-07-28 NOTE — Clinical Social Work Placement (Signed)
   CLINICAL SOCIAL WORK PLACEMENT  NOTE  Date:  07/28/2018  Patient Details  Name: Casey Gilmore MRN: [id number] Date of Birth: 11-03-1941  Clinical Social Work is seeking post-discharge placement for this patient at the [place] level of care (*CSW will initial, date and re-position this form in  chart as items are completed):      Patient/family provided with [facility] Work Department's list of facilities offering this level of care within the geographic area requested by the patient (or if unable, by the patient's family).  Yes   Patient/family informed of their freedom to choose among providers that offer the needed level of care, that participate in Medicare, Medicaid or managed care program needed by the patient, have an available bed and are willing to accept the patient.      Patient/family informed of [facility]'s ownership interest in [facility] and [facility], as well as of the fact that they are under no obligation to receive care at these facilities.  PASRR submitted to EDS on       PASRR number received on 07/28/18     Existing PASRR number confirmed on 07/28/18     FL2 transmitted to all facilities in geographic area requested by pt/family on 07/28/18     FL2 transmitted to all facilities within larger geographic area on       Patient informed that his/her managed care company has contracts with or will negotiate with certain facilities, including the following:        Yes   Patient/family informed of bed offers received.  Patient chooses bed at [facility]     Physician recommends and patient chooses bed at      Patient to be transferred to [facility] on 07/28/18.  Patient to be transferred to facility by [facility]     Patient family notified on 07/28/18 of transfer.  Name of family member notified:  [name]     PHYSICIAN       Additional Comment:    _______________________________________________ Eileen Stanford, LCSW 07/28/2018, 2:24 PM

## 2018-07-28 NOTE — Clinical Social Work Note (Signed)
Clinical Social Worker facilitated patient discharge including contacting patient family and facility to confirm patient discharge plans.  Clinical information faxed to facility and family agreeable with plan.  CSW arranged ambulance transport via [facility] to [facility] .  RN to call [phone] for report prior to discharge.  Clinical Social Worker will sign off for now as social work intervention is no longer needed. Please consult [place] again if new need arises.  Loletha Grayer, MSW [phone]

## 2018-07-28 NOTE — Clinical Social Work Note (Signed)
CSW has received insurance auth for pt to transfter to [facility] today. Auth number: [id number]. Facility updated.   Loletha Grayer, MSW [phone]

## 2018-07-28 NOTE — Plan of Care (Signed)

## 2018-07-28 NOTE — Clinical Social Work Note (Signed)
Clinical Social Work Assessment  Patient Details  Name: Casey Gilmore MRN: [id number] Date of Birth: [birth year]  Date of referral:  07/28/18               Reason for consult:  Facility Placement                Permission sought to share information with:  Family Supports Permission granted to share information::  Yes, Verbal Permission Granted  Name::     [place]::  [place]  Relationship::  Son  Sport and exercise psychologist Information:     Housing/Transportation Living arrangements for the past 2 months:  Single Family Home Source of Information:  Adult Children, Patient Patient Interpreter Needed:  None Criminal Activity/Legal Involvement Pertinent to Current Situation/Hospitalization:  No - Comment as needed Significant Relationships:  Adult Children Lives with:  Self Do you feel safe going back to the place where you live?  No Need for family participation in patient care:  No (Coment)  Care giving concerns:  Pt is alert and oriented.   Social Worker assessment / plan:  CSW spoke with pt at bedside as well as pt's son via telephone. They had interest in [facility], however they could not make a bed offer today. Family was then interested in [facility] or [facility]. At this time bed chosen is [facility]--pt agreeable. Insurance Josem Kaufmann has been started.   Employment status:  Retired Nurse, adult PT Recommendations:  [place] / Referral to community resources:  [place]  Patient/Family's Response to care:  Pt verbalized understanding of CSW role and expressed appreciation for support. Pt denies any concern regarding pt care at this time.   Patient/Family's Understanding of and Emotional Response to Diagnosis, Current Treatment, and Prognosis:  Pt understanding and realistic regarding physical limitations. Pt understands the need for SNF placement at d/c. Pt agreeable to SNF placement at d/c,  at this time. Pt's responses emotionally appropriate during conversation with CSW. Pt denies any concern regarding treatment plan at this time. CSW will continue to provide support and facilitate d/c needs.   Emotional Assessment Appearance:  Appears stated age Attitude/Demeanor/Rapport:  (Patient was appropriate) Affect (typically observed):  Accepting, Appropriate, Calm Orientation:  Oriented to Situation, Oriented to Place, Oriented to Self, Oriented to  Time Alcohol / Substance use:  Not Applicable Psych involvement (Current and /or in the community):  No (Comment)  Discharge Needs  Concerns to be addressed:  Basic Needs, Care Coordination Readmission within the last 30 days:  No Current discharge risk:  Dependent with Mobility Barriers to Discharge:  Continued Medical Work up   [employer], LCSW 07/28/2018, 12:08 PM

## 2018-07-28 NOTE — Plan of Care (Signed)

## 2018-07-28 NOTE — Discharge Summary (Signed)
Physician Discharge Summary  LATRAVIOUS LEVITT GUR:[id number] DOB: [birth year] DOA: 07/26/2018  PCP: Mayra Neer, MD  Admit date: 07/26/2018 Discharge date: 07/28/2018  Admitted From: Home Disposition:  Home  Discharge Condition:Stable CODE STATUS:FULL Diet recommendation: Heart Healthy   Brief/Interim Summary:  Patient is a 77 year old male with past medical history of recent hip fracture, frequent falls, severe aortic stenosis, diastolic CHF, and surgery of the vocal cord, dementia who was brought to the emergency department after he fell at his home.  Patient lives by himself.  Patient follows with physical therapy.  He was tachycardic when EMS arrived and was thought to be in A. fib but currently he is on  normal sinus rhythm.  Also found to be severely constipated on presentation which has resolved.  PT/OT consulted.  Recommended skilled nursing facility.  Stable for discharge today.  Following plans were addressed during hospitalization:  Frequent falls: [place] at home.  Lives by himself.  Complains of generalized weakness and back pain.  Follows with PT as an outpatient. Patient recently had a fall and fractured his right hip.  PT/OT recommended rehab.  Back pain: Chronic issue.  Continue supportive care and pain management.  Constipation: Most likely secondary to opiates for back pain.  Had a bowel movement yesterday.  Denies any abdominal discomfort.  Continue bowel regimen.  Tachycardia/suspected A. fib: Currently he is normal sinus rhythm.  No history of A. fib in the past.  No need for any intervention.  COPD: Stable .  Continue PRN nebulization.  Resume home meds on discharge.  Leukocytosis: Most likely reactive.  No clear source of infection.  Urinalysis was not suggestive of UTI.  Chest x-ray did not show any pneumonia.  Improved.  Diabetes mellitus type 2: On discharge.  Anemia: Looks like chronic   anemia.  His baseline hemoglobin ranges from 8-9.  Marland Kitchen Started  on iron supplements.  Consider following with GI as an outpatient for possible EGD/colonoscopy in the future to rule out GI bleed.  Hypokalemia: Supplemented and corrected.  Failure to thrive: Benefits from  nutrition follow-up at the skilled nursing facility.    Discharge Diagnoses:  Active Problems:   Osteoarthritis   COPD (chronic obstructive pulmonary disease) with chronic bronchitis (HCC)   Back pain   Frequent falls   Leukocytosis    Discharge Instructions  Discharge Instructions    Diet - low sodium heart healthy   Complete by:  As directed    Discharge instructions   Complete by:  As directed    1) Do CBC and BMP tests in a week. 2)Take prescribed medications as instructed. 3)Follow up with gastroenterology as an outpatient.  Name and number the provider has been attached.   Increase activity slowly   Complete by:  As directed      Allergies as of 07/28/2018   No Known Allergies     Medication List    STOP taking these medications   HYDROcodone-acetaminophen 5-325 MG tablet Commonly known as:  NORCO/VICODIN     TAKE these medications   cyanocobalamin 2000 MCG tablet Take 4,000 mcg by mouth daily.   ferrous sulfate 325 (65 FE) MG tablet Take 1 tablet (325 mg total) by mouth daily with breakfast. Start taking on:  07/29/2018   furosemide 40 MG tablet Commonly known as:  LASIX Take 40 mg by mouth daily.   lidocaine 5 % Commonly known as:  LIDODERM Place 1 patch onto the skin daily. Remove & Discard patch within 12 hours or  as directed by MD Start taking on:  07/29/2018   metFORMIN 1000 MG tablet Commonly known as:  GLUCOPHAGE Take 1,000 mg by mouth 2 (two) times daily with a meal.   methocarbamol 500 MG tablet Commonly known as:  ROBAXIN Take 1 tablet (500 mg total) by mouth every 6 (six) hours as needed for muscle spasms.   MULTIVITAMIN ADULTS Tabs Take 1 tablet by mouth daily.   oxyCODONE 5 MG immediate release tablet Commonly known as:   Oxy IR/ROXICODONE Take 1 tablet (5 mg total) by mouth every 6 (six) hours as needed for moderate pain. What changed:    how much to take  when to take this   pantoprazole 40 MG tablet Commonly known as:  PROTONIX Take 1 tablet (40 mg total) by mouth daily.   polyethylene glycol packet Commonly known as:  MIRALAX / GLYCOLAX Take 17 g by mouth 2 (two) times daily.   Vitamin D (Ergocalciferol) 50000 units Caps capsule Commonly known as:  DRISDOL Take 1 capsule (50,000 Units total) by mouth every 7 (seven) days.   XULTOPHY 100-3.6 UNIT-MG/ML Sopn Generic drug:  Insulin Degludec-Liraglutide Inject 40 Units into the skin daily.       Contact information for follow-up providers    Mayra Neer, MD. Schedule an appointment as soon as possible for a visit in 1 week(s).   Specialty:  Family Medicine Contact information: [address] [place] [address] [phone]            Contact information for after-discharge care    [facility] .   Service:  Skilled Nursing Contact information: [place] [phone]                 No Known Allergies  Consultations: None  Procedures/Studies: Dg Chest 1 View  Result Date: 07/26/2018 CLINICAL DATA:  The patient has sustained a fall and is unable to sit erect for the lateral chest x-ray. No chest complaints. History of COPD, previous MI. EXAM: CHEST  1 VIEW COMPARISON:  Frontal chest x-ray of Apr 15, 2018. FINDINGS: The lungs are well-expanded. There is no focal infiltrate. There is no pleural effusion or pneumothorax. The heart is top-normal in size. The patient has undergone previous median sternotomy and aortic valve replacement. There is no pulmonary vascular congestion. The bony thorax exhibits no acute abnormality. IMPRESSION: COPD.  There is no active cardiopulmonary disease. Electronically Signed   By: David  Martinique M.D.   On:  07/26/2018 12:49   Ct Abdomen Pelvis W Contrast  Result Date: 07/26/2018 CLINICAL DATA:  Fall 2 days ago. Right hip and flank pain. Hypoxia. EXAM: CT ABDOMEN AND PELVIS WITH CONTRAST TECHNIQUE: Multidetector CT imaging of the abdomen and pelvis was performed using the standard protocol following bolus administration of intravenous contrast. CONTRAST:  150mL ISOVUE-300 IOPAMIDOL (ISOVUE-300) INJECTION 61% COMPARISON:  CT of the abdomen and pelvis 08/07/2011 FINDINGS: Lower chest: Bibasilar airspace disease likely reflects atelectasis. Lungs are otherwise clear. Coronary artery calcifications are present. The heart size is normal. Hepatobiliary: Layering hyperdensity likely represent small stones or sludge within the gallbladder. There is no associated inflammation. The liver is within normal limits. The common bile duct is. Pancreas: Unremarkable. No pancreatic ductal dilatation or surrounding inflammatory changes. Spleen: No splenic injury or perisplenic hematoma. Adrenals/Urinary Tract: Adrenal glands are normal bilaterally. Kidneys and ureters are within normal limits. Stomach/Bowel: A small hiatal hernia is present. The stomach and  duodenum are within normal limits. The small bowel is unremarkable. Terminal ileum is within normal limits. Appendix is visualized and normal. The ascending and transverse colon are normal normal. The descending colon is unremarkable. Sigmoid colon demonstrates diverticular changes. The rectum is dilated to 9 cm in transverse diameter without proximal obstruction. Vascular/Lymphatic: Atherosclerotic calcifications are present within the aorta and branch vessels without aneurysm. Reproductive: Prostate is unremarkable. Other: Fat herniates into the left inguinal canal without associated bowel. There is no free fluid. Musculoskeletal: The left hip is fused. Right hip replacement is noted. No acute fractures present. Soft tissue swelling is present lateral to the left hip and pelvis  without underlying fracture. Superior endplate fractures at L4 and L5 are remote. A transitional S1 segment is present. Schmorl's nodes are present in the lower thoracic and upper lumbar spine. IMPRESSION: 1. Soft tissue swelling over the left hip and pelvis without underlying fracture. 2. The left hip is fused. 3. Moderate dilation of the rectum without obstruction. 4.  Aortic Atherosclerosis (ICD10-I70.0). 5. Bilateral lower lobe airspace disease likely reflects atelectasis. 6. Coronary artery disease. 7. Sludge or layering stones in the gallbladder without evidence for cholecystitis. Electronically Signed   By: San Morelle M.D.   On: 07/26/2018 15:16       Subjective: Patient seen and examined at bedside this morning.  Remains comfortable.  Still complains of some mild back pain.  Hemodynamically stable for discharge today.  Discharge Exam: Vitals:   07/28/18 0340 07/28/18 1200  BP: 128/72   Pulse: 80   Resp: 16   Temp: 99.3 F (37.4 C)   SpO2: (!) 71% 96%   Vitals:   07/27/18 1354 07/27/18 1925 07/28/18 0340 07/28/18 1200  BP: (!) 112/53 128/60 128/72   Pulse: (!) 44 83 80   Resp: 16 16 16    Temp: 98.4 F (36.9 C) 99.4 F (37.4 C) 99.3 F (37.4 C)   TempSrc: Oral Oral Oral   SpO2: 93% 96% (!) 71% 96%  Weight:        General: Pt is alert, awake, not in acute distress Cardiovascular: RRR, S1/S2 +, no rubs, no gallops Respiratory: CTA bilaterally, no wheezing, no rhonchi Abdominal: Soft, NT, ND, bowel sounds + Extremities: no edema, no cyanosis    The results of significant diagnostics from this hospitalization (including imaging, microbiology, ancillary and laboratory) are listed below for reference.     Microbiology: No results found for this or any previous visit (from the past 240 hour(s)).   Labs: BNP (last 3 results) No results for input(s): BNP in the last 8760 hours. Basic Metabolic Panel: Recent Labs  Lab 07/26/18 1154 07/27/18 0453  07/28/18 0454  NA 142 143 139  K 3.6 3.2* 3.7  CL 105 108 107  CO2 29 28 24   GLUCOSE 106* 73 145*  BUN 17 16 19   CREATININE 1.11 1.05 1.01  CALCIUM 9.0 8.7* 8.3*  MG 1.8  --   --   PHOS 2.4*  --   --    Liver Function Tests: Recent Labs  Lab 07/26/18 1154  AST 14*  ALT 11  ALKPHOS 127*  BILITOT 0.8  PROT 6.3*  ALBUMIN 3.0*   Recent Labs  Lab 07/26/18 1154  LIPASE 21   No results for input(s): AMMONIA in the last 168 hours. CBC: Recent Labs  Lab 07/26/18 1154 07/27/18 0453 07/28/18 0454 07/28/18 1025  WBC 15.1* 16.4* QUESTIONABLE RESULTS, RECOMMEND RECOLLECT TO VERIFY 7.9  NEUTROABS 11.9*  --  QUESTIONABLE  RESULTS, RECOMMEND RECOLLECT TO VERIFY 5.9  HGB 9.8* 8.9* 8.2* 7.4*  HCT 33.2* 29.6* 26.8* 24.5*  MCV 78.1 76.5* 74.4* 76.3*  PLT 247 282 QUESTIONABLE RESULTS, RECOMMEND RECOLLECT TO VERIFY 221   Cardiac Enzymes: Recent Labs  Lab 07/26/18 1154  CKTOTAL 48*   BNP: Invalid input(s): POCBNP CBG: Recent Labs  Lab 07/27/18 2131 07/27/18 2144 07/28/18 0647 07/28/18 0738 07/28/18 1157  GLUCAP 136* 144* 118* 131* 167*   D-Dimer No results for input(s): DDIMER in the last 72 hours. Hgb A1c No results for input(s): HGBA1C in the last 72 hours. Lipid Profile No results for input(s): CHOL, HDL, LDLCALC, TRIG, CHOLHDL, LDLDIRECT in the last 72 hours. Thyroid function studies No results for input(s): TSH, T4TOTAL, T3FREE, THYROIDAB in the last 72 hours.  Invalid input(s): FREET3 Anemia work up No results for input(s): VITAMINB12, FOLATE, FERRITIN, TIBC, IRON, RETICCTPCT in the last 72 hours. Urinalysis    Component Value Date/Time   COLORURINE YELLOW 07/26/2018 1623   APPEARANCEUR CLEAR 07/26/2018 1623   LABSPEC >1.046 (H) 07/26/2018 1623   PHURINE 5.0 07/26/2018 1623   GLUCOSEU NEGATIVE 07/26/2018 1623   HGBUR NEGATIVE 07/26/2018 1623   BILIRUBINUR NEGATIVE 07/26/2018 1623   KETONESUR NEGATIVE 07/26/2018 1623   PROTEINUR 30 (A) 07/26/2018 1623    UROBILINOGEN 1.0 10/12/2011 1353   NITRITE NEGATIVE 07/26/2018 1623   LEUKOCYTESUR NEGATIVE 07/26/2018 1623   Sepsis Labs Invalid input(s): PROCALCITONIN,  WBC,  LACTICIDVEN Microbiology No results found for this or any previous visit (from the past 240 hour(s)).  Please note: You were cared for by a hospitalist during your hospital stay. Once you are discharged, your primary care physician will handle any further medical issues. Please note that NO REFILLS for any discharge medications will be authorized once you are discharged, as it is imperative that you return to your primary care physician (or establish a relationship with a primary care physician if you do not have one) for your post hospital discharge needs so that they can reassess your need for medications and monitor your lab values.    Time coordinating discharge: 40 minutes  SIGNED:   Shelly Coss, MD  [facility] 07/28/2018, 2:25 PM Pager [phone]  If 7PM-7AM, please contact night-coverage [email or web address] Password [email or web address]

## 2018-07-28 NOTE — Progress Notes (Addendum)
PROGRESS NOTE    Casey Gilmore  YNW:[id number] DOB: Oct 09, 1941 DOA: 07/26/2018 PCP: Mayra Neer, MD   Brief Narrative: Patient is a 77 year old male with past medical history of recent hip fracture, frequent falls, severe aortic stenosis, diastolic CHF, and surgery of the vocal cord, dementia who was brought to the emergency department after he fell at his home.  Patient lives by himself.  Patient follows with physical therapy.  He was tachycardic when EMS arrived and was thought to be in A. fib but currently he is on  normal sinus rhythm.  Also found to be severely constipated on presentation which has resolved.  PT/OT consulted.  Recommended skilled nursing facility.  Social worker consulted.  Assessment & Plan:   Active Problems:   Osteoarthritis   COPD (chronic obstructive pulmonary disease) with chronic bronchitis (HCC)   Back pain   Frequent falls   Leukocytosis  Frequent falls: Fell at home.  Lives by himself.  Complains of generalized weakness and back pain.  Follows with PT as an outpatient. Patient recently had a fall and fractured his right hip.  PT/OT recommended rehab.  Back pain: Chronic issue.  Continue supportive care and pain management.  Constipation: Most likely secondary to opiates for back pain.  Had a bowel movement yesterday.  Denies any abdominal discomfort.  Continue bowel regimen.  Tachycardia/suspected A. fib: Currently he is normal sinus rhythm.  No history of A. fib in the past.  No need for any intervention.  COPD: Stable .  Continue PRN nebulization.  Resume home meds on discharge.  Leukocytosis: Most likely reactive.  No clear source of infection.  Urinalysis was not suggestive of UTI.  Chest x-ray did not show any pneumonia.  Questionable CBC report today with marked improvement in WBC count .  Diabetes mellitus type 2: Continue sliding scale insulin for now.  Anemia: Looks like chronic   anemia.  His baseline hemoglobin ranges from 8-9.   . Questionable CBC report today with drop in Hb.  Does not report any change in the color of the stool .  There is no report of active bleeding.  Will check FOBT.  Will check iron panel.  Anemia is microcytic.  Will start on also on iron supplements.  Hypokalemia: Supplemented and corrected.  Failure to thrive: We will request for nutrition consult  DVT prophylaxis: SCD Code Status: Full Family Communication: None present at the bedside Disposition Plan: SNF after the bed is available.  Social worker started the process today so might take 1 to 2 days more.  Consultants: None  Procedures:None  Antimicrobials: None  Subjective: Patient seen and examined the pressure this morning.  Hemodynamically stable.  Denies any complaints except for back pain and generalized weakness.  Objective: Vitals:   07/27/18 1354 07/27/18 1925 07/28/18 0340 07/28/18 1200  BP: (!) 112/53 128/60 128/72   Pulse: (!) 44 83 80   Resp: 16 16 16    Temp: 98.4 F (36.9 C) 99.4 F (37.4 C) 99.3 F (37.4 C)   TempSrc: Oral Oral Oral   SpO2: 93% 96% (!) 71% 96%  Weight:        Intake/Output Summary (Last 24 hours) at 07/28/2018 1323 Last data filed at 07/28/2018 0943 Gross per 24 hour  Intake 1777 ml  Output 775 ml  Net 1002 ml   Filed Weights   07/26/18 2041  Weight: 73.7 kg    Examination:  General exam: Appears calm and comfortable ,Not in distress, generalized weakness, thin built  HEENT:PERRL,Oral mucosa moist, Ear/Nose normal on gross exam Respiratory system: Decreased air entry on  the bases cardiovascular system: S1 & S2 heard, RRR. No JVD, murmurs, rubs, gallops or clicks. Gastrointestinal system: Abdomen is nondistended, soft and nontender. No organomegaly or masses felt. Normal bowel sounds heard. Central nervous system: Alert and oriented. No focal neurological deficits. Extremities: No edema, no clubbing ,no cyanosis, distal peripheral pulses palpable. Skin: No rashes, lesions or  ulcers,no icterus ,no pallor MSK: Normal muscle bulk,tone ,power     Data Reviewed: I have personally reviewed following labs and imaging studies  CBC: Recent Labs  Lab 07/26/18 1154 07/27/18 0453 07/28/18 0454 07/28/18 1025  WBC 15.1* 16.4* QUESTIONABLE RESULTS, RECOMMEND RECOLLECT TO VERIFY 7.9  NEUTROABS 11.9*  --  QUESTIONABLE RESULTS, RECOMMEND RECOLLECT TO VERIFY 5.9  HGB 9.8* 8.9* 8.2* 7.4*  HCT 33.2* 29.6* 26.8* 24.5*  MCV 78.1 76.5* 74.4* 76.3*  PLT 247 282 QUESTIONABLE RESULTS, RECOMMEND RECOLLECT TO VERIFY 092   Basic Metabolic Panel: Recent Labs  Lab 07/26/18 1154 07/27/18 0453 07/28/18 0454  NA 142 143 139  K 3.6 3.2* 3.7  CL 105 108 107  CO2 29 28 24   GLUCOSE 106* 73 145*  BUN 17 16 19   CREATININE 1.11 1.05 1.01  CALCIUM 9.0 8.7* 8.3*  MG 1.8  --   --   PHOS 2.4*  --   --    GFR: Estimated Creatinine Clearance: 63.8 mL/min (by C-G formula based on SCr of 1.01 mg/dL). Liver Function Tests: Recent Labs  Lab 07/26/18 1154  AST 14*  ALT 11  ALKPHOS 127*  BILITOT 0.8  PROT 6.3*  ALBUMIN 3.0*   Recent Labs  Lab 07/26/18 1154  LIPASE 21   No results for input(s): AMMONIA in the last 168 hours. Coagulation Profile: Recent Labs  Lab 07/26/18 1154  INR 1.05   Cardiac Enzymes: Recent Labs  Lab 07/26/18 1154  CKTOTAL 48*   BNP (last 3 results) No results for input(s): PROBNP in the last 8760 hours. HbA1C: No results for input(s): HGBA1C in the last 72 hours. CBG: Recent Labs  Lab 07/27/18 2131 07/27/18 2144 07/28/18 0647 07/28/18 0738 07/28/18 1157  GLUCAP 136* 144* 118* 131* 167*   Lipid Profile: No results for input(s): CHOL, HDL, LDLCALC, TRIG, CHOLHDL, LDLDIRECT in the last 72 hours. Thyroid Function Tests: No results for input(s): TSH, T4TOTAL, FREET4, T3FREE, THYROIDAB in the last 72 hours. Anemia Panel: No results for input(s): VITAMINB12, FOLATE, FERRITIN, TIBC, IRON, RETICCTPCT in the last 72 hours. Sepsis  Labs: Recent Labs  Lab 07/26/18 1237  LATICACIDVEN 1.40    No results found for this or any previous visit (from the past 240 hour(s)).       Radiology Studies: Ct Abdomen Pelvis W Contrast  Result Date: 07/26/2018 CLINICAL DATA:  Fall 2 days ago. Right hip and flank pain. Hypoxia. EXAM: CT ABDOMEN AND PELVIS WITH CONTRAST TECHNIQUE: Multidetector CT imaging of the abdomen and pelvis was performed using the standard protocol following bolus administration of intravenous contrast. CONTRAST:  123mL ISOVUE-300 IOPAMIDOL (ISOVUE-300) INJECTION 61% COMPARISON:  CT of the abdomen and pelvis 08/07/2011 FINDINGS: Lower chest: Bibasilar airspace disease likely reflects atelectasis. Lungs are otherwise clear. Coronary artery calcifications are present. The heart size is normal. Hepatobiliary: Layering hyperdensity likely represent small stones or sludge within the gallbladder. There is no associated inflammation. The liver is within normal limits. The common bile duct is. Pancreas: Unremarkable. No pancreatic ductal dilatation or surrounding inflammatory changes. Spleen: No splenic  injury or perisplenic hematoma. Adrenals/Urinary Tract: Adrenal glands are normal bilaterally. Kidneys and ureters are within normal limits. Stomach/Bowel: A small hiatal hernia is present. The stomach and duodenum are within normal limits. The small bowel is unremarkable. Terminal ileum is within normal limits. Appendix is visualized and normal. The ascending and transverse colon are normal normal. The descending colon is unremarkable. Sigmoid colon demonstrates diverticular changes. The rectum is dilated to 9 cm in transverse diameter without proximal obstruction. Vascular/Lymphatic: Atherosclerotic calcifications are present within the aorta and branch vessels without aneurysm. Reproductive: Prostate is unremarkable. Other: Fat herniates into the left inguinal canal without associated bowel. There is no free fluid.  Musculoskeletal: The left hip is fused. Right hip replacement is noted. No acute fractures present. Soft tissue swelling is present lateral to the left hip and pelvis without underlying fracture. Superior endplate fractures at L4 and L5 are remote. A transitional S1 segment is present. Schmorl's nodes are present in the lower thoracic and upper lumbar spine. IMPRESSION: 1. Soft tissue swelling over the left hip and pelvis without underlying fracture. 2. The left hip is fused. 3. Moderate dilation of the rectum without obstruction. 4.  Aortic Atherosclerosis (ICD10-I70.0). 5. Bilateral lower lobe airspace disease likely reflects atelectasis. 6. Coronary artery disease. 7. Sludge or layering stones in the gallbladder without evidence for cholecystitis. Electronically Signed   By: San Morelle M.D.   On: 07/26/2018 15:16        Scheduled Meds: . enoxaparin (LOVENOX) injection  40 mg Subcutaneous Q24H  . feeding supplement (ENSURE ENLIVE)  237 mL Oral BID BM  . insulin aspart  0-5 Units Subcutaneous QHS  . insulin aspart  0-9 Units Subcutaneous TID WC  . lidocaine  1 patch Transdermal Q24H  . pantoprazole  40 mg Oral Daily  . polyethylene glycol  17 g Oral BID  . cyanocobalamin  4,000 mcg Oral Daily   Continuous Infusions: . sodium chloride 75 mL/hr at 07/28/18 0637     LOS: 0 days    Time spent: 25 mins.More than 50% of that time was spent in counseling and/or coordination of care.      Shelly Coss, MD [facility] Pager [phone]  If 7PM-7AM, please contact night-coverage [email or web address] Password [email or web address] 07/28/2018, 1:23 PM

## 2018-07-28 NOTE — [place] FL2 (Signed)
[place] MEDICAID FL2 LEVEL OF CARE SCREENING TOOL     IDENTIFICATION  Patient Name: Casey Gilmore Birthdate: 28-Aug-1941 Sex: male Admission Date (Current Location): 07/26/2018  [facility] and [place] Number:  Herbalist and Address:  [facility], [place] [address] [place], [place] [address]      Provider Number: [id number]  Attending Physician Name and Address:  Shelly Coss, MD  Relative Name and Phone Number:       Current Level of Care: Hospital Recommended Level of Care: [place] Prior Approval Number:    Date Approved/Denied:   PASRR Number: [id number]  Discharge Plan: SNF    Current Diagnoses: Patient Active Problem List   Diagnosis Date Noted  . Back pain 07/26/2018  . Frequent falls   . Leukocytosis   . Right hip pain   . Orthostatic hypotension   . Chronic pain syndrome   . Constipation due to pain medication   . Postoperative pain   . Hypoalbuminemia due to protein-calorie malnutrition ([place])   . Closed left subtrochanteric femur fracture ([place]) 04/24/2018  . Acute blood loss anemia   . Anemia of chronic disease   . Diabetes mellitus type 2 in nonobese (HCC)   . Hyperlipidemia   . Aortic valve stenosis   . History of GI bleed   . Anemia 04/17/2018  . Closed left hip fracture ([place]) 04/15/2018  . Closed fracture of left proximal humerus 01/19/2018  . Closed fracture of left distal radius and ulna 01/19/2018  . 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 COPD (chronic obstructive pulmonary disease) with chronic bronchitis ([place]) 10/29/2014  . Chronic diastolic heart failure ([place]) 06/24/2014  . Benign essential HTN 06/24/2014  . 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 History of non-ST elevation myocardial infarction (NSTEMI) 06/24/2014  . Diabetes mellitus ([place]) 10/17/2011  . Osteoarthritis 10/17/2011    Orientation RESPIRATION BLADDER Height & Weight     Self, Place, Time,  Situation  O2(Nasal Cannula 3L) Incontinent, External catheter(placed 8/21) Weight: 162 lb 7.7 oz (73.7 kg) Height:     BEHAVIORAL SYMPTOMS/MOOD NEUROLOGICAL BOWEL NUTRITION STATUS      Incontinent Diet(Carb modified, thin liquids)  AMBULATORY STATUS COMMUNICATION OF NEEDS Skin   Extensive Assist Verbally Normal                       Personal Care Assistance Level of Assistance  Bathing, Feeding, Dressing Bathing Assistance: Maximum assistance Feeding assistance: Limited assistance Dressing Assistance: Maximum assistance     Functional Limitations Info  Sight, Hearing, Speech Sight Info: Adequate Hearing Info: Adequate Speech Info: Adequate    SPECIAL CARE FACTORS FREQUENCY  PT (By licensed PT), OT (By licensed OT)     PT Frequency: 2x OT Frequency: 2x            Contractures Contractures Info: Not present    Additional Factors Info  Code Status, Allergies Code Status Info: Full Code Allergies Info: NO known allergies           Current Medications (07/28/2018):  This is the current hospital active medication list Current Facility-Administered Medications  Medication Dose Route Frequency Provider Last Rate Last Dose  . 0.9 %  sodium chloride infusion   Intravenous Continuous Shelly Coss, MD 75 mL/hr at 07/28/18 [phone]    . acetaminophen (TYLENOL) tablet 650 mg  650 mg Oral Q6H PRN Eulogio Bear U, DO       Or  . acetaminophen (  TYLENOL) suppository 650 mg  650 mg Rectal Q6H PRN Eulogio Bear U, DO      . enoxaparin (LOVENOX) injection 40 mg  40 mg Subcutaneous Q24H Vann, Jessica U, DO   40 mg at 07/27/18 2210  . feeding supplement (ENSURE ENLIVE) (ENSURE ENLIVE) liquid 237 mL  237 mL Oral BID BM Vann, Jessica U, DO   237 mL at 07/27/18 1329  . insulin aspart (novoLOG) injection 0-5 Units  0-5 Units Subcutaneous QHS Vann, Jessica U, DO      . insulin aspart (novoLOG) injection 0-9 Units  0-9 Units Subcutaneous TID WC Vann, Jessica U, DO   3 Units at 07/27/18  1716  . lidocaine (LIDODERM) 5 % 1 patch  1 patch Transdermal Q24H Vann, Jessica U, DO   1 patch at 07/27/18 1008  . ondansetron (ZOFRAN) tablet 4 mg  4 mg Oral Q6H PRN Eulogio Bear U, DO       Or  . ondansetron (ZOFRAN) injection 4 mg  4 mg Intravenous Q6H PRN Eulogio Bear U, DO      . oxyCODONE (Oxy IR/ROXICODONE) immediate release tablet 5-10 mg  5-10 mg Oral TID PRN Eulogio Bear U, DO   5 mg at 07/27/18 1559  . pantoprazole (PROTONIX) EC tablet 40 mg  40 mg Oral Daily Vann, Jessica U, DO   40 mg at 07/27/18 1012  . polyethylene glycol (MIRALAX / GLYCOLAX) packet 17 g  17 g Oral BID Eulogio Bear U, DO   17 g at 07/27/18 2210  . vitamin B-12 (CYANOCOBALAMIN) tablet 4,000 mcg  4,000 mcg Oral Daily Eulogio Bear U, DO   4,000 mcg at 07/27/18 1008     Discharge Medications: Please see discharge summary for a list of discharge medications.  Relevant Imaging Results:  Relevant Lab Results:   Additional Information SS#: [id number]  Eileen Stanford, LCSW

## 2018-08-02 DIAGNOSIS — I509 Heart failure, unspecified: Secondary | ICD-10-CM | POA: Diagnosis not present

## 2018-08-02 DIAGNOSIS — M25552 Pain in left hip: Secondary | ICD-10-CM | POA: Diagnosis not present

## 2018-08-02 DIAGNOSIS — D649 Anemia, unspecified: Secondary | ICD-10-CM | POA: Diagnosis not present

## 2018-08-02 DIAGNOSIS — Z96642 Presence of left artificial hip joint: Secondary | ICD-10-CM | POA: Diagnosis not present

## 2018-08-02 DIAGNOSIS — M549 Dorsalgia, unspecified: Secondary | ICD-10-CM | POA: Diagnosis not present

## 2018-08-02 DIAGNOSIS — R296 Repeated falls: Secondary | ICD-10-CM | POA: Diagnosis not present

## 2018-08-06 DIAGNOSIS — J449 Chronic obstructive pulmonary disease, unspecified: Secondary | ICD-10-CM | POA: Diagnosis not present

## 2018-08-06 DIAGNOSIS — Z4789 Encounter for other orthopedic aftercare: Secondary | ICD-10-CM | POA: Diagnosis not present

## 2018-08-06 DIAGNOSIS — M1991 Primary osteoarthritis, unspecified site: Secondary | ICD-10-CM | POA: Diagnosis not present

## 2018-08-20 DIAGNOSIS — J449 Chronic obstructive pulmonary disease, unspecified: Secondary | ICD-10-CM | POA: Diagnosis not present

## 2018-08-20 DIAGNOSIS — E119 Type 2 diabetes mellitus without complications: Secondary | ICD-10-CM | POA: Diagnosis not present

## 2018-08-20 DIAGNOSIS — I110 Hypertensive heart disease with heart failure: Secondary | ICD-10-CM | POA: Diagnosis not present

## 2018-08-20 DIAGNOSIS — I5030 Unspecified diastolic (congestive) heart failure: Secondary | ICD-10-CM | POA: Diagnosis not present

## 2018-08-21 DIAGNOSIS — R296 Repeated falls: Secondary | ICD-10-CM | POA: Diagnosis not present

## 2018-08-21 DIAGNOSIS — I259 Chronic ischemic heart disease, unspecified: Secondary | ICD-10-CM | POA: Diagnosis not present

## 2018-08-21 DIAGNOSIS — I5031 Acute diastolic (congestive) heart failure: Secondary | ICD-10-CM | POA: Diagnosis not present

## 2018-08-21 DIAGNOSIS — J449 Chronic obstructive pulmonary disease, unspecified: Secondary | ICD-10-CM | POA: Diagnosis not present

## 2018-08-21 DIAGNOSIS — J441 Chronic obstructive pulmonary disease with (acute) exacerbation: Secondary | ICD-10-CM | POA: Diagnosis not present

## 2018-09-01 ENCOUNTER — Other Ambulatory Visit: Payer: Self-pay | Admitting: Family Medicine

## 2018-09-01 ENCOUNTER — Ambulatory Visit
Admission: RE | Admit: 2018-09-01 | Discharge: 2018-09-01 | Disposition: A | Discharge status: Home / Self Care | Payer: Medicare Other | Source: Ambulatory Visit | Attending: Family Medicine | Admitting: Family Medicine

## 2018-09-01 DIAGNOSIS — R05 Cough: Secondary | ICD-10-CM

## 2018-09-01 DIAGNOSIS — J449 Chronic obstructive pulmonary disease, unspecified: Secondary | ICD-10-CM | POA: Diagnosis not present

## 2018-09-01 DIAGNOSIS — R059 Cough, unspecified: Secondary | ICD-10-CM

## 2018-09-01 DIAGNOSIS — R627 Adult failure to thrive: Secondary | ICD-10-CM | POA: Diagnosis not present

## 2018-09-01 DIAGNOSIS — R0609 Other forms of dyspnea: Secondary | ICD-10-CM | POA: Diagnosis not present

## 2018-09-01 DIAGNOSIS — D649 Anemia, unspecified: Secondary | ICD-10-CM | POA: Diagnosis not present

## 2018-09-05 DIAGNOSIS — M1991 Primary osteoarthritis, unspecified site: Secondary | ICD-10-CM | POA: Diagnosis not present

## 2018-09-05 DIAGNOSIS — Z4789 Encounter for other orthopedic aftercare: Secondary | ICD-10-CM | POA: Diagnosis not present

## 2018-09-05 DIAGNOSIS — J449 Chronic obstructive pulmonary disease, unspecified: Secondary | ICD-10-CM | POA: Diagnosis not present

## 2018-09-20 DIAGNOSIS — J441 Chronic obstructive pulmonary disease with (acute) exacerbation: Secondary | ICD-10-CM | POA: Diagnosis not present

## 2018-09-20 DIAGNOSIS — I259 Chronic ischemic heart disease, unspecified: Secondary | ICD-10-CM | POA: Diagnosis not present

## 2018-09-20 DIAGNOSIS — I5031 Acute diastolic (congestive) heart failure: Secondary | ICD-10-CM | POA: Diagnosis not present

## 2018-09-20 DIAGNOSIS — J449 Chronic obstructive pulmonary disease, unspecified: Secondary | ICD-10-CM | POA: Diagnosis not present

## 2018-10-06 DIAGNOSIS — Z4789 Encounter for other orthopedic aftercare: Secondary | ICD-10-CM | POA: Diagnosis not present

## 2018-10-06 DIAGNOSIS — M1991 Primary osteoarthritis, unspecified site: Secondary | ICD-10-CM | POA: Diagnosis not present

## 2018-10-06 DIAGNOSIS — J449 Chronic obstructive pulmonary disease, unspecified: Secondary | ICD-10-CM | POA: Diagnosis not present

## 2018-10-06 DIAGNOSIS — D509 Iron deficiency anemia, unspecified: Secondary | ICD-10-CM | POA: Diagnosis not present

## 2018-10-06 DIAGNOSIS — R627 Adult failure to thrive: Secondary | ICD-10-CM | POA: Diagnosis not present

## 2018-10-06 DIAGNOSIS — E46 Unspecified protein-calorie malnutrition: Secondary | ICD-10-CM | POA: Diagnosis not present

## 2018-10-11 DIAGNOSIS — S7222XD Displaced subtrochanteric fracture of left femur, subsequent encounter for closed fracture with routine healing: Secondary | ICD-10-CM | POA: Diagnosis not present

## 2018-10-18 ENCOUNTER — Encounter (HOSPITAL_COMMUNITY): Payer: Medicare Other

## 2018-10-19 DIAGNOSIS — S52202D Unspecified fracture of shaft of left ulna, subsequent encounter for closed fracture with routine healing: Secondary | ICD-10-CM | POA: Diagnosis not present

## 2018-10-19 DIAGNOSIS — S7222XD Displaced subtrochanteric fracture of left femur, subsequent encounter for closed fracture with routine healing: Secondary | ICD-10-CM | POA: Diagnosis not present

## 2018-10-19 DIAGNOSIS — S42302D Unspecified fracture of shaft of humerus, left arm, subsequent encounter for fracture with routine healing: Secondary | ICD-10-CM | POA: Diagnosis not present

## 2018-10-19 DIAGNOSIS — S52302D Unspecified fracture of shaft of left radius, subsequent encounter for closed fracture with routine healing: Secondary | ICD-10-CM | POA: Diagnosis not present

## 2018-10-21 DIAGNOSIS — I259 Chronic ischemic heart disease, unspecified: Secondary | ICD-10-CM | POA: Diagnosis not present

## 2018-10-21 DIAGNOSIS — I5031 Acute diastolic (congestive) heart failure: Secondary | ICD-10-CM | POA: Diagnosis not present

## 2018-10-21 DIAGNOSIS — J441 Chronic obstructive pulmonary disease with (acute) exacerbation: Secondary | ICD-10-CM | POA: Diagnosis not present

## 2018-10-21 DIAGNOSIS — J449 Chronic obstructive pulmonary disease, unspecified: Secondary | ICD-10-CM | POA: Diagnosis not present

## 2018-10-26 DIAGNOSIS — J449 Chronic obstructive pulmonary disease, unspecified: Secondary | ICD-10-CM | POA: Diagnosis not present

## 2018-10-26 DIAGNOSIS — S42302D Unspecified fracture of shaft of humerus, left arm, subsequent encounter for fracture with routine healing: Secondary | ICD-10-CM | POA: Diagnosis not present

## 2018-10-26 DIAGNOSIS — K5900 Constipation, unspecified: Secondary | ICD-10-CM | POA: Diagnosis not present

## 2018-10-26 DIAGNOSIS — E119 Type 2 diabetes mellitus without complications: Secondary | ICD-10-CM | POA: Diagnosis not present

## 2018-10-26 DIAGNOSIS — S7222XD Displaced subtrochanteric fracture of left femur, subsequent encounter for closed fracture with routine healing: Secondary | ICD-10-CM | POA: Diagnosis not present

## 2018-10-26 DIAGNOSIS — I5030 Unspecified diastolic (congestive) heart failure: Secondary | ICD-10-CM | POA: Diagnosis not present

## 2018-10-26 DIAGNOSIS — Z952 Presence of prosthetic heart valve: Secondary | ICD-10-CM | POA: Diagnosis not present

## 2018-10-26 DIAGNOSIS — F0390 Unspecified dementia without behavioral disturbance: Secondary | ICD-10-CM | POA: Diagnosis not present

## 2018-10-26 DIAGNOSIS — I2510 Atherosclerotic heart disease of native coronary artery without angina pectoris: Secondary | ICD-10-CM | POA: Diagnosis not present

## 2018-10-26 DIAGNOSIS — S52302D Unspecified fracture of shaft of left radius, subsequent encounter for closed fracture with routine healing: Secondary | ICD-10-CM | POA: Diagnosis not present

## 2018-10-26 DIAGNOSIS — M1991 Primary osteoarthritis, unspecified site: Secondary | ICD-10-CM | POA: Diagnosis not present

## 2018-10-26 DIAGNOSIS — I110 Hypertensive heart disease with heart failure: Secondary | ICD-10-CM | POA: Diagnosis not present

## 2018-10-26 DIAGNOSIS — D500 Iron deficiency anemia secondary to blood loss (chronic): Secondary | ICD-10-CM | POA: Diagnosis not present

## 2018-10-26 DIAGNOSIS — M545 Low back pain: Secondary | ICD-10-CM | POA: Diagnosis not present

## 2018-10-26 DIAGNOSIS — S52202D Unspecified fracture of shaft of left ulna, subsequent encounter for closed fracture with routine healing: Secondary | ICD-10-CM | POA: Diagnosis not present

## 2018-10-26 DIAGNOSIS — G8929 Other chronic pain: Secondary | ICD-10-CM | POA: Diagnosis not present

## 2018-11-05 DIAGNOSIS — J449 Chronic obstructive pulmonary disease, unspecified: Secondary | ICD-10-CM | POA: Diagnosis not present

## 2018-11-05 DIAGNOSIS — M1991 Primary osteoarthritis, unspecified site: Secondary | ICD-10-CM | POA: Diagnosis not present

## 2018-11-05 DIAGNOSIS — Z4789 Encounter for other orthopedic aftercare: Secondary | ICD-10-CM | POA: Diagnosis not present

## 2018-11-20 DIAGNOSIS — J441 Chronic obstructive pulmonary disease with (acute) exacerbation: Secondary | ICD-10-CM | POA: Diagnosis not present

## 2018-11-20 DIAGNOSIS — I5031 Acute diastolic (congestive) heart failure: Secondary | ICD-10-CM | POA: Diagnosis not present

## 2018-11-20 DIAGNOSIS — I259 Chronic ischemic heart disease, unspecified: Secondary | ICD-10-CM | POA: Diagnosis not present

## 2018-11-20 DIAGNOSIS — J449 Chronic obstructive pulmonary disease, unspecified: Secondary | ICD-10-CM | POA: Diagnosis not present

## 2018-12-06 DIAGNOSIS — Z4789 Encounter for other orthopedic aftercare: Secondary | ICD-10-CM | POA: Diagnosis not present

## 2018-12-06 DIAGNOSIS — J449 Chronic obstructive pulmonary disease, unspecified: Secondary | ICD-10-CM | POA: Diagnosis not present

## 2018-12-06 DIAGNOSIS — M1991 Primary osteoarthritis, unspecified site: Secondary | ICD-10-CM | POA: Diagnosis not present

## 2018-12-20 DIAGNOSIS — I5022 Chronic systolic (congestive) heart failure: Secondary | ICD-10-CM | POA: Diagnosis not present

## 2018-12-20 DIAGNOSIS — E1169 Type 2 diabetes mellitus with other specified complication: Secondary | ICD-10-CM | POA: Diagnosis not present

## 2018-12-20 DIAGNOSIS — R627 Adult failure to thrive: Secondary | ICD-10-CM | POA: Diagnosis not present

## 2018-12-20 DIAGNOSIS — I110 Hypertensive heart disease with heart failure: Secondary | ICD-10-CM | POA: Diagnosis not present

## 2018-12-21 DIAGNOSIS — I259 Chronic ischemic heart disease, unspecified: Secondary | ICD-10-CM | POA: Diagnosis not present

## 2018-12-21 DIAGNOSIS — J449 Chronic obstructive pulmonary disease, unspecified: Secondary | ICD-10-CM | POA: Diagnosis not present

## 2018-12-21 DIAGNOSIS — I5031 Acute diastolic (congestive) heart failure: Secondary | ICD-10-CM | POA: Diagnosis not present

## 2018-12-21 DIAGNOSIS — J441 Chronic obstructive pulmonary disease with (acute) exacerbation: Secondary | ICD-10-CM | POA: Diagnosis not present

## 2019-01-06 DIAGNOSIS — M1991 Primary osteoarthritis, unspecified site: Secondary | ICD-10-CM | POA: Diagnosis not present

## 2019-01-06 DIAGNOSIS — J449 Chronic obstructive pulmonary disease, unspecified: Secondary | ICD-10-CM | POA: Diagnosis not present

## 2019-01-06 DIAGNOSIS — Z4789 Encounter for other orthopedic aftercare: Secondary | ICD-10-CM | POA: Diagnosis not present

## 2019-01-21 DIAGNOSIS — J441 Chronic obstructive pulmonary disease with (acute) exacerbation: Secondary | ICD-10-CM | POA: Diagnosis not present

## 2019-01-21 DIAGNOSIS — I5031 Acute diastolic (congestive) heart failure: Secondary | ICD-10-CM | POA: Diagnosis not present

## 2019-01-21 DIAGNOSIS — I259 Chronic ischemic heart disease, unspecified: Secondary | ICD-10-CM | POA: Diagnosis not present

## 2019-01-21 DIAGNOSIS — J449 Chronic obstructive pulmonary disease, unspecified: Secondary | ICD-10-CM | POA: Diagnosis not present

## 2019-02-04 DIAGNOSIS — Z4789 Encounter for other orthopedic aftercare: Secondary | ICD-10-CM | POA: Diagnosis not present

## 2019-02-04 DIAGNOSIS — J449 Chronic obstructive pulmonary disease, unspecified: Secondary | ICD-10-CM | POA: Diagnosis not present

## 2019-02-04 DIAGNOSIS — M1991 Primary osteoarthritis, unspecified site: Secondary | ICD-10-CM | POA: Diagnosis not present

## 2019-02-19 DIAGNOSIS — I5031 Acute diastolic (congestive) heart failure: Secondary | ICD-10-CM | POA: Diagnosis not present

## 2019-02-19 DIAGNOSIS — J441 Chronic obstructive pulmonary disease with (acute) exacerbation: Secondary | ICD-10-CM | POA: Diagnosis not present

## 2019-02-19 DIAGNOSIS — J449 Chronic obstructive pulmonary disease, unspecified: Secondary | ICD-10-CM | POA: Diagnosis not present

## 2019-02-19 DIAGNOSIS — I259 Chronic ischemic heart disease, unspecified: Secondary | ICD-10-CM | POA: Diagnosis not present

## 2019-03-07 DIAGNOSIS — M1991 Primary osteoarthritis, unspecified site: Secondary | ICD-10-CM | POA: Diagnosis not present

## 2019-03-07 DIAGNOSIS — J449 Chronic obstructive pulmonary disease, unspecified: Secondary | ICD-10-CM | POA: Diagnosis not present

## 2019-03-07 DIAGNOSIS — Z4789 Encounter for other orthopedic aftercare: Secondary | ICD-10-CM | POA: Diagnosis not present

## 2019-03-22 DIAGNOSIS — J441 Chronic obstructive pulmonary disease with (acute) exacerbation: Secondary | ICD-10-CM | POA: Diagnosis not present

## 2019-03-22 DIAGNOSIS — J449 Chronic obstructive pulmonary disease, unspecified: Secondary | ICD-10-CM | POA: Diagnosis not present

## 2019-03-22 DIAGNOSIS — I5031 Acute diastolic (congestive) heart failure: Secondary | ICD-10-CM | POA: Diagnosis not present

## 2019-03-22 DIAGNOSIS — I259 Chronic ischemic heart disease, unspecified: Secondary | ICD-10-CM | POA: Diagnosis not present

## 2019-04-04 DIAGNOSIS — I110 Hypertensive heart disease with heart failure: Secondary | ICD-10-CM | POA: Diagnosis not present

## 2019-04-04 DIAGNOSIS — Z0000 Encounter for general adult medical examination without abnormal findings: Secondary | ICD-10-CM | POA: Diagnosis not present

## 2019-04-04 DIAGNOSIS — E1169 Type 2 diabetes mellitus with other specified complication: Secondary | ICD-10-CM | POA: Diagnosis not present

## 2019-04-04 DIAGNOSIS — I5022 Chronic systolic (congestive) heart failure: Secondary | ICD-10-CM | POA: Diagnosis not present

## 2019-04-21 DIAGNOSIS — J449 Chronic obstructive pulmonary disease, unspecified: Secondary | ICD-10-CM | POA: Diagnosis not present

## 2019-04-21 DIAGNOSIS — I5031 Acute diastolic (congestive) heart failure: Secondary | ICD-10-CM | POA: Diagnosis not present

## 2019-04-21 DIAGNOSIS — I259 Chronic ischemic heart disease, unspecified: Secondary | ICD-10-CM | POA: Diagnosis not present

## 2019-04-21 DIAGNOSIS — J441 Chronic obstructive pulmonary disease with (acute) exacerbation: Secondary | ICD-10-CM | POA: Diagnosis not present

## 2019-05-22 DIAGNOSIS — I5031 Acute diastolic (congestive) heart failure: Secondary | ICD-10-CM | POA: Diagnosis not present

## 2019-05-22 DIAGNOSIS — J449 Chronic obstructive pulmonary disease, unspecified: Secondary | ICD-10-CM | POA: Diagnosis not present

## 2019-05-22 DIAGNOSIS — I259 Chronic ischemic heart disease, unspecified: Secondary | ICD-10-CM | POA: Diagnosis not present

## 2019-05-22 DIAGNOSIS — J441 Chronic obstructive pulmonary disease with (acute) exacerbation: Secondary | ICD-10-CM | POA: Diagnosis not present

## 2019-08-03 DIAGNOSIS — I110 Hypertensive heart disease with heart failure: Secondary | ICD-10-CM | POA: Diagnosis not present

## 2019-08-03 DIAGNOSIS — J449 Chronic obstructive pulmonary disease, unspecified: Secondary | ICD-10-CM | POA: Diagnosis not present

## 2019-08-03 DIAGNOSIS — D509 Iron deficiency anemia, unspecified: Secondary | ICD-10-CM | POA: Diagnosis not present

## 2019-08-03 DIAGNOSIS — E782 Mixed hyperlipidemia: Secondary | ICD-10-CM | POA: Diagnosis not present

## 2019-08-10 DIAGNOSIS — E1169 Type 2 diabetes mellitus with other specified complication: Secondary | ICD-10-CM | POA: Diagnosis not present

## 2019-08-10 DIAGNOSIS — E782 Mixed hyperlipidemia: Secondary | ICD-10-CM | POA: Diagnosis not present

## 2019-08-10 DIAGNOSIS — I110 Hypertensive heart disease with heart failure: Secondary | ICD-10-CM | POA: Diagnosis not present

## 2019-08-10 DIAGNOSIS — D509 Iron deficiency anemia, unspecified: Secondary | ICD-10-CM | POA: Diagnosis not present

## 2019-12-03 DIAGNOSIS — I110 Hypertensive heart disease with heart failure: Secondary | ICD-10-CM | POA: Diagnosis not present

## 2019-12-03 DIAGNOSIS — J449 Chronic obstructive pulmonary disease, unspecified: Secondary | ICD-10-CM | POA: Diagnosis not present

## 2019-12-03 DIAGNOSIS — E1169 Type 2 diabetes mellitus with other specified complication: Secondary | ICD-10-CM | POA: Diagnosis not present

## 2019-12-03 DIAGNOSIS — D509 Iron deficiency anemia, unspecified: Secondary | ICD-10-CM | POA: Diagnosis not present

## 2019-12-17 ENCOUNTER — Other Ambulatory Visit: Payer: Self-pay

## 2019-12-17 ENCOUNTER — Inpatient Hospital Stay (HOSPITAL_COMMUNITY)
Admission: EM | Admit: 2019-12-17 | Discharge: 2019-12-24 | DRG: 199 | Disposition: A | Discharge status: Skilled Nursing Facility | Payer: Medicare Other | Attending: Internal Medicine | Admitting: Internal Medicine

## 2019-12-17 ENCOUNTER — Encounter (HOSPITAL_COMMUNITY): Payer: Self-pay | Admitting: Emergency Medicine

## 2019-12-17 ENCOUNTER — Inpatient Hospital Stay (HOSPITAL_COMMUNITY): Payer: Medicare Other

## 2019-12-17 ENCOUNTER — Emergency Department (HOSPITAL_COMMUNITY): Payer: Medicare Other

## 2019-12-17 DIAGNOSIS — Z96641 Presence of right artificial hip joint: Secondary | ICD-10-CM | POA: Diagnosis present

## 2019-12-17 DIAGNOSIS — Z8249 Family history of ischemic heart disease and other diseases of the circulatory system: Secondary | ICD-10-CM | POA: Diagnosis not present

## 2019-12-17 DIAGNOSIS — S0081XA Abrasion of other part of head, initial encounter: Secondary | ICD-10-CM | POA: Diagnosis present

## 2019-12-17 DIAGNOSIS — N289 Disorder of kidney and ureter, unspecified: Secondary | ICD-10-CM | POA: Diagnosis present

## 2019-12-17 DIAGNOSIS — G894 Chronic pain syndrome: Secondary | ICD-10-CM | POA: Diagnosis not present

## 2019-12-17 DIAGNOSIS — M1990 Unspecified osteoarthritis, unspecified site: Secondary | ICD-10-CM | POA: Diagnosis present

## 2019-12-17 DIAGNOSIS — S2242XA Multiple fractures of ribs, left side, initial encounter for closed fracture: Secondary | ICD-10-CM | POA: Diagnosis not present

## 2019-12-17 DIAGNOSIS — R778 Other specified abnormalities of plasma proteins: Secondary | ICD-10-CM

## 2019-12-17 DIAGNOSIS — E785 Hyperlipidemia, unspecified: Secondary | ICD-10-CM | POA: Diagnosis not present

## 2019-12-17 DIAGNOSIS — R2681 Unsteadiness on feet: Secondary | ICD-10-CM | POA: Diagnosis not present

## 2019-12-17 DIAGNOSIS — Z7401 Bed confinement status: Secondary | ICD-10-CM | POA: Diagnosis not present

## 2019-12-17 DIAGNOSIS — J9621 Acute and chronic respiratory failure with hypoxia: Secondary | ICD-10-CM | POA: Diagnosis not present

## 2019-12-17 DIAGNOSIS — Y92012 Bathroom of single-family (private) house as the place of occurrence of the external cause: Secondary | ICD-10-CM | POA: Diagnosis not present

## 2019-12-17 DIAGNOSIS — E162 Hypoglycemia, unspecified: Secondary | ICD-10-CM | POA: Diagnosis not present

## 2019-12-17 DIAGNOSIS — R52 Pain, unspecified: Secondary | ICD-10-CM

## 2019-12-17 DIAGNOSIS — I252 Old myocardial infarction: Secondary | ICD-10-CM | POA: Diagnosis not present

## 2019-12-17 DIAGNOSIS — Z96652 Presence of left artificial knee joint: Secondary | ICD-10-CM | POA: Diagnosis present

## 2019-12-17 DIAGNOSIS — Z8521 Personal history of malignant neoplasm of larynx: Secondary | ICD-10-CM | POA: Diagnosis not present

## 2019-12-17 DIAGNOSIS — J4489 Other specified chronic obstructive pulmonary disease: Secondary | ICD-10-CM | POA: Diagnosis present

## 2019-12-17 DIAGNOSIS — I4819 Other persistent atrial fibrillation: Secondary | ICD-10-CM | POA: Diagnosis not present

## 2019-12-17 DIAGNOSIS — I350 Nonrheumatic aortic (valve) stenosis: Secondary | ICD-10-CM | POA: Diagnosis not present

## 2019-12-17 DIAGNOSIS — I5032 Chronic diastolic (congestive) heart failure: Secondary | ICD-10-CM | POA: Diagnosis present

## 2019-12-17 DIAGNOSIS — Z87891 Personal history of nicotine dependence: Secondary | ICD-10-CM | POA: Diagnosis not present

## 2019-12-17 DIAGNOSIS — K449 Diaphragmatic hernia without obstruction or gangrene: Secondary | ICD-10-CM | POA: Diagnosis present

## 2019-12-17 DIAGNOSIS — I10 Essential (primary) hypertension: Secondary | ICD-10-CM | POA: Diagnosis not present

## 2019-12-17 DIAGNOSIS — D509 Iron deficiency anemia, unspecified: Secondary | ICD-10-CM | POA: Diagnosis present

## 2019-12-17 DIAGNOSIS — I110 Hypertensive heart disease with heart failure: Secondary | ICD-10-CM | POA: Diagnosis present

## 2019-12-17 DIAGNOSIS — I452 Bifascicular block: Secondary | ICD-10-CM | POA: Diagnosis present

## 2019-12-17 DIAGNOSIS — M2550 Pain in unspecified joint: Secondary | ICD-10-CM | POA: Diagnosis not present

## 2019-12-17 DIAGNOSIS — S270XXD Traumatic pneumothorax, subsequent encounter: Secondary | ICD-10-CM | POA: Diagnosis not present

## 2019-12-17 DIAGNOSIS — J939 Pneumothorax, unspecified: Secondary | ICD-10-CM | POA: Diagnosis not present

## 2019-12-17 DIAGNOSIS — R2689 Other abnormalities of gait and mobility: Secondary | ICD-10-CM | POA: Diagnosis not present

## 2019-12-17 DIAGNOSIS — S270XXA Traumatic pneumothorax, initial encounter: Secondary | ICD-10-CM | POA: Diagnosis not present

## 2019-12-17 DIAGNOSIS — Z471 Aftercare following joint replacement surgery: Secondary | ICD-10-CM | POA: Diagnosis not present

## 2019-12-17 DIAGNOSIS — M25461 Effusion, right knee: Secondary | ICD-10-CM | POA: Diagnosis not present

## 2019-12-17 DIAGNOSIS — E876 Hypokalemia: Secondary | ICD-10-CM | POA: Diagnosis not present

## 2019-12-17 DIAGNOSIS — M25552 Pain in left hip: Secondary | ICD-10-CM | POA: Diagnosis not present

## 2019-12-17 DIAGNOSIS — R1312 Dysphagia, oropharyngeal phase: Secondary | ICD-10-CM | POA: Diagnosis not present

## 2019-12-17 DIAGNOSIS — U071 COVID-19: Secondary | ICD-10-CM | POA: Diagnosis not present

## 2019-12-17 DIAGNOSIS — L89152 Pressure ulcer of sacral region, stage 2: Secondary | ICD-10-CM | POA: Diagnosis present

## 2019-12-17 DIAGNOSIS — W1830XA Fall on same level, unspecified, initial encounter: Secondary | ICD-10-CM | POA: Diagnosis present

## 2019-12-17 DIAGNOSIS — M25462 Effusion, left knee: Secondary | ICD-10-CM | POA: Diagnosis not present

## 2019-12-17 DIAGNOSIS — E11649 Type 2 diabetes mellitus with hypoglycemia without coma: Secondary | ICD-10-CM | POA: Diagnosis not present

## 2019-12-17 DIAGNOSIS — R5381 Other malaise: Secondary | ICD-10-CM | POA: Diagnosis not present

## 2019-12-17 DIAGNOSIS — R000 Tachycardia, unspecified: Secondary | ICD-10-CM | POA: Diagnosis not present

## 2019-12-17 DIAGNOSIS — E119 Type 2 diabetes mellitus without complications: Secondary | ICD-10-CM

## 2019-12-17 DIAGNOSIS — I4891 Unspecified atrial fibrillation: Secondary | ICD-10-CM | POA: Diagnosis present

## 2019-12-17 DIAGNOSIS — R4182 Altered mental status, unspecified: Secondary | ICD-10-CM | POA: Diagnosis not present

## 2019-12-17 DIAGNOSIS — M6281 Muscle weakness (generalized): Secondary | ICD-10-CM | POA: Diagnosis not present

## 2019-12-17 DIAGNOSIS — M25561 Pain in right knee: Secondary | ICD-10-CM | POA: Diagnosis not present

## 2019-12-17 DIAGNOSIS — R079 Chest pain, unspecified: Secondary | ICD-10-CM | POA: Diagnosis not present

## 2019-12-17 DIAGNOSIS — W19XXXA Unspecified fall, initial encounter: Secondary | ICD-10-CM | POA: Diagnosis not present

## 2019-12-17 DIAGNOSIS — K219 Gastro-esophageal reflux disease without esophagitis: Secondary | ICD-10-CM | POA: Diagnosis present

## 2019-12-17 DIAGNOSIS — S79912A Unspecified injury of left hip, initial encounter: Secondary | ICD-10-CM | POA: Diagnosis not present

## 2019-12-17 DIAGNOSIS — E161 Other hypoglycemia: Secondary | ICD-10-CM | POA: Diagnosis not present

## 2019-12-17 DIAGNOSIS — Z66 Do not resuscitate: Secondary | ICD-10-CM | POA: Diagnosis present

## 2019-12-17 DIAGNOSIS — Z953 Presence of xenogenic heart valve: Secondary | ICD-10-CM

## 2019-12-17 DIAGNOSIS — Z9181 History of falling: Secondary | ICD-10-CM | POA: Diagnosis not present

## 2019-12-17 DIAGNOSIS — Z7984 Long term (current) use of oral hypoglycemic drugs: Secondary | ICD-10-CM | POA: Diagnosis not present

## 2019-12-17 DIAGNOSIS — R41841 Cognitive communication deficit: Secondary | ICD-10-CM | POA: Diagnosis not present

## 2019-12-17 DIAGNOSIS — J449 Chronic obstructive pulmonary disease, unspecified: Secondary | ICD-10-CM | POA: Diagnosis present

## 2019-12-17 DIAGNOSIS — J9601 Acute respiratory failure with hypoxia: Secondary | ICD-10-CM

## 2019-12-17 DIAGNOSIS — I480 Paroxysmal atrial fibrillation: Secondary | ICD-10-CM | POA: Diagnosis present

## 2019-12-17 DIAGNOSIS — W19XXXS Unspecified fall, sequela: Secondary | ICD-10-CM | POA: Diagnosis not present

## 2019-12-17 DIAGNOSIS — E860 Dehydration: Secondary | ICD-10-CM | POA: Diagnosis present

## 2019-12-17 LAB — CBC WITH DIFFERENTIAL/PLATELET
Band Neutrophils: 1 %
Basophils Absolute: 0.2 10*3/uL — ABNORMAL HIGH (ref 0.0–0.1)
Basophils Relative: 1 %
Eosinophils Absolute: 0 10*3/uL (ref 0.0–0.5)
Eosinophils Relative: 0 %
HCT: 37.4 % — ABNORMAL LOW (ref 39.0–52.0)
Hemoglobin: 11.1 g/dL — ABNORMAL LOW (ref 13.0–17.0)
Lymphocytes Relative: 7 %
Lymphs Abs: 1.1 10*3/uL (ref 0.7–4.0)
MCH: 22.9 pg — ABNORMAL LOW (ref 26.0–34.0)
MCHC: 29.7 g/dL — ABNORMAL LOW (ref 30.0–36.0)
MCV: 77.1 fL — ABNORMAL LOW (ref 80.0–100.0)
Monocytes Absolute: 0.3 10*3/uL (ref 0.1–1.0)
Monocytes Relative: 2 %
Neutro Abs: 14.6 10*3/uL — ABNORMAL HIGH (ref 1.7–7.7)
Neutrophils Relative %: 89 %
Platelets: 420 10*3/uL — ABNORMAL HIGH (ref 150–400)
RBC: 4.85 MIL/uL (ref 4.22–5.81)
RDW: 19.3 % — ABNORMAL HIGH (ref 11.5–15.5)
WBC: 16.2 10*3/uL — ABNORMAL HIGH (ref 4.0–10.5)
nRBC: 0 % (ref 0.0–0.2)

## 2019-12-17 LAB — CBG MONITORING, ED
Glucose-Capillary: 103 mg/dL — ABNORMAL HIGH (ref 70–99)
Glucose-Capillary: 112 mg/dL — ABNORMAL HIGH (ref 70–99)
Glucose-Capillary: 43 mg/dL — CL (ref 70–99)
Glucose-Capillary: 60 mg/dL — ABNORMAL LOW (ref 70–99)
Glucose-Capillary: 60 mg/dL — ABNORMAL LOW (ref 70–99)
Glucose-Capillary: 64 mg/dL — ABNORMAL LOW (ref 70–99)
Glucose-Capillary: 70 mg/dL (ref 70–99)
Glucose-Capillary: 74 mg/dL (ref 70–99)
Glucose-Capillary: 76 mg/dL (ref 70–99)
Glucose-Capillary: 97 mg/dL (ref 70–99)

## 2019-12-17 LAB — TROPONIN I (HIGH SENSITIVITY)
Troponin I (High Sensitivity): 145 ng/L (ref <18)
Troponin I (High Sensitivity): 128 ng/L (ref <18)

## 2019-12-17 LAB — URINALYSIS, ROUTINE W REFLEX MICROSCOPIC
Bacteria, UA: NONE SEEN
Bilirubin Urine: NEGATIVE
Glucose, UA: NEGATIVE mg/dL
Ketones, ur: NEGATIVE mg/dL
Nitrite: NEGATIVE
Protein, ur: NEGATIVE mg/dL
Specific Gravity, Urine: 1.014 (ref 1.005–1.030)
pH: 5 (ref 5.0–8.0)

## 2019-12-17 LAB — HEMOGLOBIN A1C
Hgb A1c MFr Bld: 5.8 % — ABNORMAL HIGH (ref 4.8–5.6)
Mean Plasma Glucose: 119.76 mg/dL

## 2019-12-17 LAB — POC SARS CORONAVIRUS 2 AG -  ED: SARS Coronavirus 2 Ag: NEGATIVE

## 2019-12-17 LAB — BASIC METABOLIC PANEL
Anion gap: 17 — ABNORMAL HIGH (ref 5–15)
BUN: 22 mg/dL (ref 8–23)
CO2: 26 mmol/L (ref 22–32)
Calcium: 6.5 mg/dL — ABNORMAL LOW (ref 8.9–10.3)
Chloride: 100 mmol/L (ref 98–111)
Creatinine, Ser: 1.68 mg/dL — ABNORMAL HIGH (ref 0.61–1.24)
GFR calc Af Amer: 44 mL/min — ABNORMAL LOW (ref >60)
GFR calc non Af Amer: 38 mL/min — ABNORMAL LOW (ref >60)
Glucose, Bld: 53 mg/dL — ABNORMAL LOW (ref 70–99)
Potassium: 3.4 mmol/L — ABNORMAL LOW (ref 3.5–5.1)
Sodium: 143 mmol/L (ref 135–145)

## 2019-12-17 LAB — TSH: TSH: 2.092 u[IU]/mL (ref 0.350–4.500)

## 2019-12-17 LAB — SARS CORONAVIRUS 2 (TAT 6-24 HRS): SARS Coronavirus 2: NEGATIVE

## 2019-12-17 MED ORDER — ACETAMINOPHEN 650 MG RE SUPP: 650.0000 mg | Freq: Four times a day (QID) | RECTAL | Status: DC | PRN

## 2019-12-17 MED ORDER — DEXTROSE 50 % IV SOLN
1.0000 | Freq: Once | INTRAVENOUS | Status: AC
  Administered 2019-12-17: 12:00:00 50 mL via INTRAVENOUS
  Filled 2019-12-17: qty 50

## 2019-12-17 MED ORDER — ACETAMINOPHEN 325 MG PO TABS
650.0000 mg | ORAL_TABLET | Freq: Four times a day (QID) | ORAL | Status: DC
  Administered 2019-12-17 – 2019-12-24 (×25): 650 mg via ORAL
  Filled 2019-12-17 (×25): qty 2

## 2019-12-17 MED ORDER — ONDANSETRON HCL 4 MG PO TABS: 4.0000 mg | ORAL_TABLET | Freq: Four times a day (QID) | ORAL | Status: DC | PRN

## 2019-12-17 MED ORDER — IPRATROPIUM BROMIDE 0.02 % IN SOLN
0.5000 mg | Freq: Four times a day (QID) | RESPIRATORY_TRACT | Status: DC
  Administered 2019-12-18 (×2): 0.5 mg via RESPIRATORY_TRACT
  Filled 2019-12-17 (×2): qty 2.5

## 2019-12-17 MED ORDER — DOCUSATE SODIUM 100 MG PO CAPS
100.0000 mg | ORAL_CAPSULE | Freq: Two times a day (BID) | ORAL | Status: DC
  Administered 2019-12-18 – 2019-12-24 (×14): 100 mg via ORAL
  Filled 2019-12-17 (×14): qty 1

## 2019-12-17 MED ORDER — PANTOPRAZOLE SODIUM 40 MG PO TBEC
40.0000 mg | DELAYED_RELEASE_TABLET | Freq: Every day | ORAL | Status: DC
  Administered 2019-12-18 – 2019-12-24 (×7): 40 mg via ORAL
  Filled 2019-12-17 (×7): qty 1

## 2019-12-17 MED ORDER — OXYCODONE HCL 5 MG PO TABS
5.0000 mg | ORAL_TABLET | ORAL | Status: DC | PRN
  Administered 2019-12-19 – 2019-12-20 (×4): 5 mg via ORAL
  Administered 2019-12-20 – 2019-12-21 (×3): 10 mg via ORAL
  Administered 2019-12-24: 5 mg via ORAL
  Filled 2019-12-17 (×2): qty 2
  Filled 2019-12-17 (×4): qty 1
  Filled 2019-12-17 (×4): qty 2

## 2019-12-17 MED ORDER — ONDANSETRON HCL 4 MG/2ML IJ SOLN: 4.0000 mg | Freq: Four times a day (QID) | INTRAMUSCULAR | Status: DC | PRN

## 2019-12-17 MED ORDER — DILTIAZEM LOAD VIA INFUSION
15.0000 mg | Freq: Once | INTRAVENOUS | Status: AC
  Administered 2019-12-17: 15 mg via INTRAVENOUS
  Filled 2019-12-17: qty 15

## 2019-12-17 MED ORDER — SODIUM CHLORIDE 0.9% FLUSH
3.0000 mL | Freq: Two times a day (BID) | INTRAVENOUS | Status: DC
  Administered 2019-12-18 – 2019-12-24 (×12): 3 mL via INTRAVENOUS

## 2019-12-17 MED ORDER — ACETAMINOPHEN 325 MG PO TABS: 650.0000 mg | ORAL_TABLET | Freq: Four times a day (QID) | ORAL | Status: DC | PRN

## 2019-12-17 MED ORDER — DEXTROSE 50 % IV SOLN
50.0000 mL | Freq: Once | INTRAVENOUS | Status: AC
  Administered 2019-12-17: 50 mL via INTRAVENOUS
  Filled 2019-12-17: qty 50

## 2019-12-17 MED ORDER — METHOCARBAMOL 500 MG PO TABS
500.0000 mg | ORAL_TABLET | Freq: Three times a day (TID) | ORAL | Status: DC
  Administered 2019-12-17 – 2019-12-24 (×21): 500 mg via ORAL
  Filled 2019-12-17 (×21): qty 1

## 2019-12-17 MED ORDER — INSULIN ASPART 100 UNIT/ML ~~LOC~~ SOLN
0.0000 [IU] | Freq: Three times a day (TID) | SUBCUTANEOUS | Status: DC
  Administered 2019-12-18 – 2019-12-20 (×5): 3 [IU] via SUBCUTANEOUS
  Administered 2019-12-20: 2 [IU] via SUBCUTANEOUS
  Administered 2019-12-20: 17:00:00 3 [IU] via SUBCUTANEOUS
  Administered 2019-12-21 (×2): 2 [IU] via SUBCUTANEOUS
  Administered 2019-12-22 (×2): 3 [IU] via SUBCUTANEOUS
  Administered 2019-12-22 – 2019-12-23 (×4): 2 [IU] via SUBCUTANEOUS
  Administered 2019-12-24: 3 [IU] via SUBCUTANEOUS
  Administered 2019-12-24: 2 [IU] via SUBCUTANEOUS

## 2019-12-17 MED ORDER — MORPHINE SULFATE (PF) 2 MG/ML IV SOLN
1.0000 mg | INTRAVENOUS | Status: DC | PRN
  Administered 2019-12-18: 2 mg via INTRAVENOUS
  Filled 2019-12-17: qty 1

## 2019-12-17 MED ORDER — DEXTROSE-NACL 5-0.45 % IV SOLN: INTRAVENOUS | Status: DC

## 2019-12-17 MED ORDER — LIDOCAINE-EPINEPHRINE (PF) 2 %-1:200000 IJ SOLN
10.0000 mL | Freq: Once | INTRAMUSCULAR | Status: DC
  Filled 2019-12-17: qty 20

## 2019-12-17 MED ORDER — DILTIAZEM HCL-DEXTROSE 125-5 MG/125ML-% IV SOLN (PREMIX)
5.0000 mg/h | INTRAVENOUS | Status: DC
  Administered 2019-12-17: 17:00:00 5 mg/h via INTRAVENOUS
  Filled 2019-12-17 (×3): qty 125

## 2019-12-17 NOTE — ED Notes (Signed)
CBG results of 64 reported to Phill, RN

## 2019-12-17 NOTE — ED Notes (Signed)
Pt's CBG result was 76. Informed Anna - RN.

## 2019-12-17 NOTE — ED Notes (Signed)
Spoke on phone to pt's son [name] - said L leg has metal rod and he cannot bend at the hip baseline. Son also said the family found him down in the bathroom yesterday afternoon beside his commode and WC. unsure how he fell, but he apparently can walk per son, just prefers his WC

## 2019-12-17 NOTE — ED Notes (Signed)
Pt's sats 87% on 2LNC. Bumped to [place] only 91%. MD notified, will speak to Trauma again

## 2019-12-17 NOTE — H&P (Signed)
History and Physical    Casey Gilmore [id number] DOB: 1941/06/05 DOA: 12/17/2019  PCP: Mayra Neer, MD Consultants:  orthopedics Patient coming from:  Home - lives alone, son lives next door and has daytime caregivers; NOK: [place], [place], [phone]  Chief Complaint:  fall  HPI: Casey Gilmore is a 79 y.o. male with medical history significant of UGI bleeding; s/p bioprosthetic AVR; afib; CAD; HTN; HLD; DM; COPD; and vocal cord CA presenting with fall.  The patient is A&O x 2 and reports that he is not sure why he is here.  His son reports that he refuses to get out of the wheelchair (stell rod in hip and significant leg shortening).  He was in his bed and his wheelchair was in a different room and family wasn't sure how that happened.  Yesterday PM, his brother found him down the bathroom with a cut above his eye, unsure what happened.  "Girl showed up in the morning" - he didn't recognize her or his daughter-in-law.  Glucose was 44, which was very unusual for him.  He does not usually have memory problems.   He is not on oxygen at home and has needed oxygen.  The last time he was asked about resuscitation he said he wanted to be DNR.  He has been concerned about having to be placed in a facility - family would consider it but would prefer him being at home if at all possible.   ED Course:  Fall with small pneumothorax - trauma surgery to follow.  Family found down yesterday.  Not on blood thinners.  Hypoglycemia, glu in 40s, given OJ and D50; recurrent, starting D5 drip. HS troponin 145, EKG with artifact, looks like afib with HR 90-100.  Multiple rib fractures, PTX, no chest tube.    Review of Systems: As per HPI; otherwise review of systems reviewed and negative.   Ambulatory Status:  Able to ambulate but prefers wheelchair  Past Medical History:  Diagnosis Date  . Anemia    2012  . Arthritis   . Blood transfusion   . Cancer ([place])    VOCAL CORD  . Closed fracture  of left distal radius and ulna 01/19/2018  . Closed fracture of left proximal humerus 01/19/2018  . COPD (chronic obstructive pulmonary disease) ([place])   . Diabetes mellitus   . GERD (gastroesophageal reflux disease)   . Heart murmur   . Hiatal hernia   . Hyperlipidemia   . Hypertension   . NSTEMI (non-ST elevated myocardial infarction) ([place]) 07/2011   in setting of Pneumonia with normal coronary arteries on cath  . Pneumonia hx 8/12  . Severe aortic stenosis 2012   s/p bioprosthetic AVR  . Shortness of breath    with exertion  . Upper GI bleeding     Past Surgical History:  Procedure Laterality Date  . AORTIC VALVE REPLACEMENT  10/15/2011   Procedure: AORTIC VALVE REPLACEMENT (AVR);  Surgeon: Tharon Aquas Adelene Idler, MD;  Location: [facility];  Service: Open Heart Surgery;  Laterality: N/A;  . CARDIAC CATHETERIZATION  results on chart   2012 w no significant disease,echo with Severe Aortic stenosis,LVH,Diastolic dysfunction.   . COLONOSCOPY  12/2004  . EYE SURGERY  bil cat 08  . FRACTURE SURGERY  pin in lft hip  . JOINT REPLACEMENT  left knee 08, rt hip 08  . LARYNGOSCOPY  01/05/2013   Procedure: LARYNGOSCOPY;  Surgeon: Melida Quitter, MD;  Location: [facility];  Service: ENT;  Laterality:  N/A;  micro direct laryngoscopy with biopsy, possible co2 laser, small laser-safe endotracheal tube  . LARYNGOSCOPY N/A 02/08/2013   Procedure: LARYNGOSCOPY;  Surgeon: Melida Quitter, MD;  Location: [facility];  Service: ENT;  Laterality: N/A;  Micro Direct Laryngoscopy with Co2 laser with re-excision right vocal cord lesion.  . OPEN REDUCTION INTERNAL FIXATION (ORIF) DISTAL RADIAL FRACTURE Left 01/19/2018   Procedure: OPEN REDUCTION INTERNAL FIXATION (ORIF) DISTAL RADIAL FRACTURE;  Surgeon: Marchia Bond, MD;  Location: [facility];  Service: Orthopedics;  Laterality: Left;  . ORIF FEMUR FRACTURE Left 04/18/2018   Procedure: OPEN REDUCTION INTERNAL FIXATION PROXIMAL FEMORAL SHAFT FRACTURE...REMOVAL OF HARDWARE LEFT FEMUR (HIP  FUSION PLATE);  Surgeon: Altamese Cherry Hill Mall, MD;  Location: [facility];  Service: Orthopedics;  Laterality: Left;  . ORIF HUMERUS FRACTURE Left 01/19/2018   Procedure: OPEN REDUCTION INTERNAL FIXATION (ORIF) PROXIMAL HUMERUS FRACTURE;  Surgeon: Marchia Bond, MD;  Location: [facility];  Service: Orthopedics;  Laterality: Left;  . TONSILLECTOMY    . TOTAL HIP ARTHROPLASTY Right   . TOTAL KNEE ARTHROPLASTY Left   . 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 Marital status: Single    Spouse name: Not on file  . Number of children: Not on file  . Years of education: Not on file  . Highest education level: Not on file  Occupational History  . Not on file  Tobacco Use  . Smoking status: Former Smoker    Packs/day: 2.00    Years: 25.00    Pack years: 50.00    Types: Cigarettes    Quit date: 12/06/1978    Years since quitting: 41.0  . Smokeless tobacco: Never Used  Substance and Sexual Activity  . Alcohol use: No  . Drug use: No  . Sexual activity: Not on file  Other Topics Concern  . Not on file  Social History Narrative  . Not on file   Social Determinants of Health   Financial Resource Strain:   . Difficulty of Paying Living Expenses: Not on file  Food Insecurity:   . Worried About Charity fundraiser in the Last Year: Not on file  . Ran Out of Food in the Last Year: Not on file  Transportation Needs:   . Lack of Transportation (Medical): Not on file  . Lack of Transportation (Non-Medical): Not on file  Physical Activity:   . Days of Exercise per Week: Not on file  . Minutes of Exercise per Session: Not on file  Stress:   . Feeling of Stress : Not on file  Social Connections:   . Frequency of Communication with Friends and Family: Not on file  . Frequency of Social Gatherings with Friends and Family: Not on file  . Attends Religious Services: Not on file  . Active Member of Clubs or  Organizations: Not on file  . Attends Archivist Meetings: Not on file  . Marital Status: Not on file  Intimate Partner Violence:   . Fear of Current or Ex-Partner: Not on file  . Emotionally Abused: Not on file  . Physically Abused: Not on file  . Sexually Abused: Not on file    No Known Allergies  Family History  Problem Relation Age of Onset  . Hypertension Father     Prior to Admission medications   Medication Sig Start Date End Date Taking? Authorizing Provider  ferrous sulfate 325 (65 FE) MG  tablet Take 1 tablet (325 mg total) by mouth daily with breakfast. 07/29/18 12/17/19 Yes Adhikari, Tamsen Meek, MD  HYDROcodone-acetaminophen (NORCO/VICODIN) 5-325 MG tablet Take 1 tablet by mouth 2 (two) times daily as needed. 11/13/19  Yes [provider]  Insulin Degludec-Liraglutide (XULTOPHY) 100-3.6 UNIT-MG/ML SOPN Inject 40 Units into the skin daily.   Yes [provider]  metFORMIN (GLUCOPHAGE) 1000 MG tablet Take 1,000 mg by mouth 2 (two) times daily with a meal.   Yes [provider]  methocarbamol (ROBAXIN) 500 MG tablet Take 1 tablet (500 mg total) by mouth every 6 (six) hours as needed for muscle spasms. 05/11/18  Yes Angiulli, Lavon Paganini, PA-C  pantoprazole (PROTONIX) 40 MG tablet Take 1 tablet (40 mg total) by mouth daily. 05/12/18  Yes Angiulli, Lavon Paganini, PA-C  lidocaine (LIDODERM) 5 % Place 1 patch onto the skin daily. Remove & Discard patch within 12 hours or as directed by MD Patient not taking: Reported on 12/17/2019 07/29/18   Shelly Coss, MD  oxyCODONE (OXY IR/ROXICODONE) 5 MG immediate release tablet Take 1 tablet (5 mg total) by mouth every 6 (six) hours as needed for moderate pain. Patient not taking: Reported on 12/17/2019 07/28/18   Shelly Coss, MD  Vitamin D, Ergocalciferol, (DRISDOL) 50000 units CAPS capsule Take 1 capsule (50,000 Units total) by mouth every 7 (seven) days. Patient not taking: Reported on 12/17/2019 05/14/18   Cathlyn Parsons, PA-C    Physical Exam: Vitals:   12/17/19 1505 12/17/19 1530 12/17/19 1730 12/17/19 1745  BP: (!) 141/84 134/83 119/82 (!) 142/77  Pulse: 96 65 89 61  Resp: (!) 27 (!) 31 (!) 27 (!) 30  Temp:      TempSrc:      SpO2: 90% 93% 92% 95%  Weight:         . General:  Appears calm and comfortable and is NAD, mildly confused . Eyes:  PERRL, EOMI, normal lids, iris . ENT:  grossly normal hearing, lips & tongue, mmm . Neck:  no LAD, masses or thyromegaly . Cardiovascular:  Irregularly irregular, no m/r/g. No LE edema.  Marland Kitchen Respiratory:   CTA bilaterally with no wheezes/rales/rhonchi.  Mildly increased respiratory effort, O2 sats in 80s despite [place] O2 (increased) - improved with increase to 10L [place] O2 and now appears more comfortable . Abdomen:  soft, NT, ND, NABS . Skin:  no rash or induration seen on limited exam . Musculoskeletal:  grossly normal tone BUE/BLE, good ROM, no bony abnormality . Psychiatric:  grossly normal mood and affect, speech fluent and appropriate, AOx2 . Neurologic:  CN 2-12 grossly intact, moves all extremities in coordinated fashion    Radiological Exams on Admission: CT Head Wo Contrast  Result Date: 12/17/2019 CLINICAL DATA:  Altered mental status since fall yesterday. EXAM: CT HEAD WITHOUT CONTRAST CT CERVICAL SPINE WITHOUT CONTRAST TECHNIQUE: Multidetector CT imaging of the head and cervical spine was performed following the standard protocol without intravenous contrast. Multiplanar CT image reconstructions of the cervical spine were also generated. COMPARISON:  CT head and cervical spine dated January 15, 2018. 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 No evidence of hemorrhage, hydrocephalus, extra-axial collection or mass lesion/mass effect. Stable moderate atrophy and mild chronic microvascular ischemic changes. Vascular: Atherosclerotic vascular calcification of the  carotid siphons. No hyperdense vessel. Skull: Normal. Negative for fracture or focal lesion. Sinuses/Orbits: No acute finding. Other: None. CT CERVICAL SPINE FINDINGS Alignment: Normal. Skull  base and vertebrae: No acute fracture. No primary bone lesion or focal pathologic process. Soft tissues and spinal canal: No prevertebral fluid or swelling. No visible canal hematoma. Disc levels: Moderate disc height loss and uncovertebral hypertrophy at C5-C6 and C6-C7, unchanged. Upper chest: Small left apical pneumothorax. Other: None. IMPRESSION: 1. 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 2. No acute cervical spine fracture. Stable moderate degenerative changes at C5-C6 and C6-C7. 3. Small left apical pneumothorax. Electronically Signed   By: Titus Dubin M.D.   On: 12/17/2019 13:09   CT Cervical Spine Wo Contrast  Result Date: 12/17/2019 CLINICAL DATA:  Altered mental status since fall yesterday. EXAM: CT HEAD WITHOUT CONTRAST CT CERVICAL SPINE WITHOUT CONTRAST TECHNIQUE: Multidetector CT imaging of the head and cervical spine was performed following the standard protocol without intravenous contrast. Multiplanar CT image reconstructions of the cervical spine were also generated. COMPARISON:  CT head and cervical spine dated January 15, 2018. 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 No evidence of hemorrhage, hydrocephalus, extra-axial collection or mass lesion/mass effect. Stable moderate atrophy and mild chronic microvascular ischemic changes. Vascular: Atherosclerotic vascular calcification of the carotid siphons. No hyperdense vessel. Skull: Normal. Negative for fracture or focal lesion. Sinuses/Orbits: No acute finding. Other: None. CT CERVICAL SPINE FINDINGS Alignment: Normal. Skull base and vertebrae: No acute fracture. No primary bone lesion or focal pathologic process.  Soft tissues and spinal canal: No prevertebral fluid or swelling. No visible canal hematoma. Disc levels: Moderate disc height loss and uncovertebral hypertrophy at C5-C6 and C6-C7, unchanged. Upper chest: Small left apical pneumothorax. Other: None. IMPRESSION: 1. 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 2. No acute cervical spine fracture. Stable moderate degenerative changes at C5-C6 and C6-C7. 3. Small left apical pneumothorax. Electronically Signed   By: Titus Dubin M.D.   On: 12/17/2019 13:09   DG CHEST PORT 1 VIEW  Result Date: 12/17/2019 CLINICAL DATA:  Acute respiratory failure EXAM: PORTABLE CHEST 1 VIEW COMPARISON:  Earlier same day FINDINGS: Multiple left rib fractures are again identified with adjacent subcutaneous emphysema. Probable small left apical pneumothorax remains unchanged. Patchy left lung density, which may reflect atelectasis/contusion. Stable cardiomediastinal contours with evidence of aortic valve replacement. IMPRESSION: Probable small left apical pneumothorax remains unchanged. Patchy left lung density probably reflecting atelectasis/contusion. Electronically Signed   By: Macy Mis M.D.   On: 12/17/2019 16:59   DG Chest Portable 1 View  Result Date: 12/17/2019 CLINICAL DATA:  Fall.  Confusion.  Chest pain. EXAM: PORTABLE CHEST 1 VIEW COMPARISON:  09/01/2018 FINDINGS: Previous median sternotomy and aortic valve replacement. Mild cardiac enlargement. Hiatal hernia is noted. The right chest is clear. There are fractures of the right lateral ribs, at least rib numbers 4, 5, 6, 7 and 8. There is a left pneumothorax without tension, estimated at 10%. IMPRESSION: Left-sided rib fractures 4 through 8. Left pneumothorax without tension, estimated at 10%. Previous aortic valve replacement. Hiatal hernia. Electronically Signed   By: Nelson Chimes M.D.   On: 12/17/2019 11:13   DG Hip [place] W or Wo Pelvis 1 View Left  Result  Date: 12/17/2019 CLINICAL DATA:  Left hip pain after fall. EXAM: DG HIP (WITH OR WITHOUT PELVIS) 1V PORT LEFT COMPARISON:  Apr 18, 2018. FINDINGS: Status post surgical fusion of the left hip joint. Status post surgical internal fixation of proximal left femoral  fracture. Status post right total hip arthroplasty. No definite acute fracture or dislocation is noted. IMPRESSION: Postsurgical changes as described above. No definite acute abnormality is noted. Electronically Signed   By: Marijo Conception M.D.   On: 12/17/2019 13:54    EKG: Independently reviewed.  Afib with rate 130; RBBB; LAFB; PVCs; no evidence of obvious ischemia   Labs on Admission: I have personally reviewed the available labs and imaging studies at the time of the admission.  Pertinent labs:   K+ 3.4 Glucose 53, 76, 70, 97, 60 BUN 22/Creatinine 1.68/GFR 38 Calcium 6.5 HS troponin 145, 128 WBC 16.2 Hgb 11.1 Platelets 420 UA: small Hgb, trace LE BD COVID is negative; Hologic is pending   Assessment/Plan Principal Problem:   Fall Active Problems:   Diabetes mellitus (HCC)   Chronic diastolic heart failure (HCC)   Benign essential HTN   COPD (chronic obstructive pulmonary disease) with chronic bronchitis (HCC)   Chronic pain syndrome   Multiple closed fractures of ribs of left side   Pneumothorax, closed, traumatic, initial encounter   Atrial fibrillation with RVR ([place])   Fall with rib fractures and PTX -Patient with fall yesterday, suffering from multiple rib fractures and traumatic PTX -Initial concern at the time of my evaluation was for progression of PTX but he has stabilized on increased [place] O2 and CXR appears to be stable -Surgery is following, plan to repeat CXR in AM -High-flow [place] O2 is generally recommended for PTX and patient may also be splinting in response to pain as the cause for his hypoxic respiratory failure  Acute on chronic hypoxic respiratory failure -Patient has underlying COPD but is not on  home O2 -Acute component as above -He does not appear to be taking medications for the COPD and so it appears that it is not particularly advanced  DM, currently with hypoglycemia -Patient with long-standing DM, on Xultophy and Glucophage at home -Persistent hypoglycemia while here and family also reports hypoglycemia at home -Since he has underlying diastolic CHF, would prefer to avoid a dextrose infusion if possible -Will feed patient and continue to monitor glucose hourly -For now will hold all home DM medications  Afib with RVR  -Patient presenting with afib with RVR.  -Etiology is thought to be related to his trauma, rib fractures -He has had apparent non-sustained periodic PAF in the past -Will admit to SDU for Diltiazem drip as per protocol with plan to transition to PO Diltiazem once heart rate is controlled; patient needs admission based on afib associated with a high-risk situation (fall with pneumothorax). -HS troponin elevated, likely associated with demand ischemia -Consider ASA 81 mg PO daily.   -Will request Echocardiogram for further evaluation  -Will consult cardiology  -Patient is not on any anti-coagulatants at home. Patient has high risk for fall, bleeding, history of GIB, therefore is not a good candidate for anticoagulation.   HTN -He does not appear to be taking medication for this issue at this time  HLD -He does not appear to be taking medication for this issue at this time  Chronic pain -On Vicodin at home as well as Robaxin -I have reviewed this patient in the [place] Controlled Substances Reporting System.  He is receiving medications from only one provider and appears to be taking them as prescribed. -He is not at particularly high risk of opioid misuse, diversion, or overdose.  Diastolic CHF -[id number] Echo with preserved EF, bioprosthetic valve -Repeat echo ordered by cardiology    Note:  This patient has been tested and is negative for the novel  coronavirus COVID-19 by BD testing; Hologic confirmatory testing is pending.  DVT prophylaxis:  SCDs Code Status:  DNR - confirmed with family Family Communication: None present; I spoke with his son by telephone  Disposition Plan:  To be determined Consults called: Trauma; Cardiology; [facility] team; PT/OT Admission status: Admit - It is my clinical opinion that admission to INPATIENT is reasonable and necessary because of the expectation that this patient will require hospital care that crosses at least 2 midnights to treat this condition based on the medical complexity of the problems presented.  Given the aforementioned information, the predictability of an adverse outcome is felt to be significant.    Karmen Bongo MD [facility]   How to contact the [facility] Attending or Consulting provider [place] or covering provider during after hours [place], for this patient?  1. Check the care team in [facility] and look for a) attending/consulting [facility] provider listed and b) the [facility] team listed 2. Log into [email or web address] and use [place]'s universal password to access. If you do not have the password, please contact the hospital operator. 3. Locate the [facility] provider you are looking for under [facility] and page to a number that you can be directly reached. 4. If you still have difficulty reaching the provider, please page the [facility] (Director on Call) for the Hospitalists listed on amion for assistance.   12/17/2019, 6:00 PM

## 2019-12-17 NOTE — ED Notes (Signed)
Pt placed on [facility] for sats 88%

## 2019-12-17 NOTE — Consult Note (Signed)
[facility] Consult Note  Casey Gilmore 09/07/1941  [id number].    Requesting MD: Tyrone Nine Chief Complaint/Reason for Consult: fall with rib fractures HPI:  Patient is a 79 year old male who was brought in via EMS as a non-level trauma activation after fall around 3 PM yesterday. Noted to have some altered mental status today as he does not recall falling or why he is here now. Per EMS patient was found on the floor near the commode by a family member. He has a history of falls, but per family is able to ambulate, however he prefers his wheelchair. Rod to left femur present with chronic limited ROM and shortened LLE. Patient is unable to recall fall and family reports he has been confused and weak since.  Denied HA, visual change, numbness or tingling in extremities, abdominal pain, SOB, or chest pain while I'm in the room. Family thought that patient was on a blood thinner but could not remember the name, no blood thinning medications listed in patient's chart. PMH significant for aortic valve replacement with tissue valve in 2012, Hx of NSTEMI, HTN, HLD, COPD, GERD, Hx of laryngeal cancer, T2DM. NKDA. Most of the history is obtained from the chart as the patient is unable and no family at bedside.  ROS: Review of Systems  Constitutional: Negative for chills and fever.  HENT: Negative for congestion, ear pain and nosebleeds.        Complains of a right eye laceration  Eyes: Negative for blurred vision and double vision.  Respiratory: Positive for shortness of breath (states this is high baseline.  denies oxygen use at home). Negative for wheezing.   Cardiovascular: Positive for chest pain (L chest wall, to EDP before I arrived, currently denies).  Gastrointestinal: Negative for abdominal pain, diarrhea, nausea and vomiting.  Genitourinary: Negative for dysuria and frequency.  Musculoskeletal: Positive for falls and joint pain. Negative for back pain and neck pain.  Skin: Negative  for rash.  Neurological: Positive for weakness. Negative for tingling, focal weakness and headaches.  Psychiatric/Behavioral: Negative for substance abuse.  All other systems reviewed and are negative.   No family history on file.  Past Medical History:  Diagnosis Date  . Anemia    2012  . Arthritis   . Blood transfusion   . 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 Closed fracture of left distal radius and ulna 01/19/2018  . Closed fracture of left proximal humerus 01/19/2018  . COPD (chronic obstructive pulmonary disease) ([place])   . Diabetes mellitus   . GERD (gastroesophageal reflux disease)   . Heart murmur   . Hiatal hernia   . Hyperlipidemia   . Hypertension   . NSTEMI (non-ST elevated myocardial infarction) ([place]) 07/2011   in setting of Pneumonia with normal coronary arteries on cath  . Pneumonia hx 8/12  . Severe aortic stenosis 2012   s/p bioprosthetic AVR  . Shortness of breath    with exertion  . Upper GI bleeding     Past Surgical History:  Procedure Laterality Date  . AORTIC VALVE REPLACEMENT  10/15/2011   Procedure: AORTIC VALVE REPLACEMENT (AVR);  Surgeon: Tharon Aquas Adelene Idler, MD;  Location: [place];  Service: Open Heart Surgery;  Laterality: N/A;  . CARDIAC CATHETERIZATION  results on chart   2012 w no significant disease,echo with Severe Aortic stenosis,LVH,Diastolic dysfunction.   . COLONOSCOPY  12/2004  . EYE SURGERY  bil cat 08  . FRACTURE SURGERY  pin in  lft hip  . JOINT REPLACEMENT  left knee 08, rt hip 08  . LARYNGOSCOPY  01/05/2013   Procedure: LARYNGOSCOPY;  Surgeon: Melida Quitter, MD;  Location: [place];  Service: ENT;  Laterality: N/A;  micro direct laryngoscopy with biopsy, possible co2 laser, small laser-safe endotracheal tube  . LARYNGOSCOPY N/A 02/08/2013   Procedure: LARYNGOSCOPY;  Surgeon: Melida Quitter, MD;  Location: [place];  Service: ENT;  Laterality: N/A;  Micro Direct Laryngoscopy with Co2 laser with re-excision right vocal cord lesion.  . OPEN REDUCTION  INTERNAL FIXATION (ORIF) DISTAL RADIAL FRACTURE Left 01/19/2018   Procedure: OPEN REDUCTION INTERNAL FIXATION (ORIF) DISTAL RADIAL FRACTURE;  Surgeon: Marchia Bond, MD;  Location: [facility];  Service: Orthopedics;  Laterality: Left;  . ORIF FEMUR FRACTURE Left 04/18/2018   Procedure: OPEN REDUCTION INTERNAL FIXATION PROXIMAL FEMORAL SHAFT FRACTURE...REMOVAL OF HARDWARE LEFT FEMUR (HIP FUSION PLATE);  Surgeon: Altamese Elsmere, MD;  Location: [place];  Service: Orthopedics;  Laterality: Left;  . ORIF HUMERUS FRACTURE Left 01/19/2018   Procedure: OPEN REDUCTION INTERNAL FIXATION (ORIF) PROXIMAL HUMERUS FRACTURE;  Surgeon: Marchia Bond, MD;  Location: [place];  Service: Orthopedics;  Laterality: Left;  . TONSILLECTOMY    . TOTAL HIP ARTHROPLASTY Right   . TOTAL KNEE ARTHROPLASTY Left   . 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 His smoking use included cigarettes. He has a 50.00 pack-year smoking history. He has never used smokeless tobacco. He reports that he does not drink alcohol or use drugs.  Allergies: No Known Allergies  (Not in a hospital admission)   Blood pressure (!) 167/96, pulse 87, temperature 99.1 F (37.3 C), temperature source Oral, resp. rate (!) 30, weight 73.9 kg, SpO2 94 %. Physical Exam: Physical Exam  General: pleasant, but confused, WD, WN white male who is laying in bed in NAD HEENT: head is normocephalic, but with 2 steri-strips criss crossed over his right eyelid.  Sclera are noninjected.  PERRL.  Ears and nose without any masses or lesions.  Mouth is pink and moist Heart: irregularly irregular and tachy between 105-120s.  Normal s1,s2. No obvious murmurs, gallops, or rubs noted.  Palpable radial and pedal pulses bilaterally Lungs: bilateral rhonchi with some upper airway congestion that he was unable to cough up, mild left lateral chest wall pain but  no ecchymosis or bruising noted. Abd: soft, NT, ND, +BS, no masses, hernias, or organomegaly MS: all 4 extremities are symmetrical with no cyanosis, clubbing, or edema.  Spontaneous moves all extremities but full ROM was not tested in LEs Skin: warm and dry with no masses, lesions, or rashes Psych: Alert, but confused and unable to appropriately answer many of my questions.   Results for orders placed or performed during the hospital encounter of 12/17/19 (from the past 48 hour(s))  CBG monitoring, ED     Status: Abnormal   Collection Time: 12/17/19  9:38 AM  Result Value Ref Range   Glucose-Capillary 43 (LL) 70 - 99 mg/dL  Basic metabolic panel     Status: Abnormal   Collection Time: 12/17/19  9:55 AM  Result Value Ref Range   Sodium 143 135 - 145 mmol/L   Potassium 3.4 (L) 3.5 - 5.1 mmol/L   Chloride 100 98 - 111 mmol/L   CO2 26 22 - 32 mmol/L   Glucose, Bld 53 (L) 70 - 99 mg/dL   BUN  22 8 - 23 mg/dL   Creatinine, Ser 1.68 (H) 0.61 - 1.24 mg/dL   Calcium 6.5 (L) 8.9 - 10.3 mg/dL   GFR calc non Af Amer 38 (L) >60 mL/min   GFR calc Af Amer 44 (L) >60 mL/min   Anion gap 17 (H) 5 - 15    Comment: Performed at [facility] [address] [place], [place] [address]  CBC with Differential     Status: Abnormal   Collection Time: 12/17/19  9:55 AM  Result Value Ref Range   WBC 16.2 (H) 4.0 - 10.5 K/uL    Comment: WHITE COUNT CONFIRMED ON SMEAR   RBC 4.85 4.22 - 5.81 MIL/uL   Hemoglobin 11.1 (L) 13.0 - 17.0 g/dL   HCT 37.4 (L) 39.0 - 52.0 %   MCV 77.1 (L) 80.0 - 100.0 fL   MCH 22.9 (L) 26.0 - 34.0 pg   MCHC 29.7 (L) 30.0 - 36.0 g/dL   RDW 19.3 (H) 11.5 - 15.5 %   Platelets 420 (H) 150 - 400 K/uL   nRBC 0.0 0.0 - 0.2 %   Neutrophils Relative % 89 %   Neutro Abs 14.6 (H) 1.7 - 7.7 K/uL   Band Neutrophils 1 %   Lymphocytes Relative 7 %   Lymphs Abs 1.1 0.7 - 4.0 K/uL   Monocytes Relative 2 %   Monocytes Absolute 0.3 0.1 - 1.0 K/uL   Eosinophils Relative 0 %   Eosinophils  Absolute 0.0 0.0 - 0.5 K/uL   Basophils Relative 1 %   Basophils Absolute 0.2 (H) 0.0 - 0.1 K/uL   RBC Morphology MORPHOLOGY UNREMARKABLE     Comment: Performed at [facility], [place] [address], [place], [place] [address]  Troponin I (High Sensitivity)     Status: Abnormal   Collection Time: 12/17/19  9:55 AM  Result Value Ref Range   Troponin I (High Sensitivity) 145 (HH) <18 ng/L    Comment: CRITICAL RESULT CALLED TO, READ BACK BY AND VERIFIED WITH: MEEKS,L RN @ 1108 12/17/19 LEONARD,A (NOTE) Elevated high sensitivity troponin I (hsTnI) values and significant  changes across serial measurements may suggest ACS but many other  chronic and acute conditions are known to elevate hsTnI results.  Refer to the Links section for chest pain algorithms and additional  guidance. Performed at [facility], [place] [address] [place], [place] [address]   CBG monitoring, ED     Status: None   Collection Time: 12/17/19 10:16 AM  Result Value Ref Range   Glucose-Capillary 76 70 - 99 mg/dL   Comment 1 Notify RN    Comment 2 Document in Chart   CBG monitoring, ED     Status: None   Collection Time: 12/17/19 11:12 AM  Result Value Ref Range   Glucose-Capillary 70 70 - 99 mg/dL  Urinalysis, Routine w reflex microscopic     Status: Abnormal   Collection Time: 12/17/19 11:45 AM  Result Value Ref Range   Color, Urine YELLOW YELLOW   APPearance HAZY (A) CLEAR   Specific Gravity, Urine 1.014 1.005 - 1.030   pH 5.0 5.0 - 8.0   Glucose, UA NEGATIVE NEGATIVE mg/dL   Hgb urine dipstick SMALL (A) NEGATIVE   Bilirubin Urine NEGATIVE NEGATIVE   Ketones, ur NEGATIVE NEGATIVE mg/dL   Protein, ur NEGATIVE NEGATIVE mg/dL   Nitrite NEGATIVE NEGATIVE   Leukocytes,Ua TRACE (A) NEGATIVE   RBC / HPF 0-5 0 - 5 RBC/hpf   WBC,  UA 0-5 0 - 5 WBC/hpf   Bacteria, UA NONE SEEN NONE SEEN   Squamous Epithelial / LPF 0-5 0 - 5   Mucus PRESENT     Comment: Performed at [facility], [place] [address]  [place], [place] [address]  POC SARS Coronavirus 2 Ag-ED - Nasal Swab (BD Veritor Kit)     Status: None   Collection Time: 12/17/19 12:16 PM  Result Value Ref Range   SARS Coronavirus 2 Ag NEGATIVE NEGATIVE    Comment: (NOTE) SARS-CoV-2 antigen NOT DETECTED.  Negative results are presumptive.  Negative results do not preclude SARS-CoV-2 infection and should not be used as the sole basis for treatment or other patient management decisions, including infection  control decisions, particularly in the presence of clinical signs and  symptoms consistent with COVID-19, or in those who have been in contact with the virus.  Negative results must be combined with clinical observations, patient history, and epidemiological information. The expected result is Negative. Fact Sheet for Patients: PodPark.tn Fact Sheet for Healthcare Providers: GiftContent.is This test is not yet approved or cleared by the [place] FDA and  has been authorized for detection and/or diagnosis of SARS-CoV-2 by FDA under an Emergency Use Authorization (EUA).  This EUA will remain in effect (meaning this test can be used) for the duration of  the COVID-19 de claration under Section 564(b)(1) of the Act, 21 U.S.C. section 360bbb-3(b)(1), unless the authorization is terminated or revoked sooner.   POC CBG, ED     Status: None   Collection Time: 12/17/19 12:42 PM  Result Value Ref Range   Glucose-Capillary 97 70 - 99 mg/dL   Comment 1 Notify RN    Comment 2 Document in Chart    CT Head Wo Contrast  Result Date: 12/17/2019 CLINICAL DATA:  Altered mental status since fall yesterday. EXAM: CT HEAD WITHOUT CONTRAST CT CERVICAL SPINE WITHOUT CONTRAST TECHNIQUE: Multidetector CT imaging of the head and cervical spine was performed following the standard protocol without intravenous contrast. Multiplanar CT image reconstructions of the cervical spine were also generated.  COMPARISON:  CT head and cervical spine dated January 15, 2018. 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 No evidence of hemorrhage, hydrocephalus, extra-axial collection or mass lesion/mass effect. Stable moderate atrophy and mild chronic microvascular ischemic changes. Vascular: Atherosclerotic vascular calcification of the carotid siphons. No hyperdense vessel. Skull: Normal. Negative for fracture or focal lesion. Sinuses/Orbits: No acute finding. Other: None. CT CERVICAL SPINE FINDINGS Alignment: Normal. Skull base and vertebrae: No acute fracture. No primary bone lesion or focal pathologic process. Soft tissues and spinal canal: No prevertebral fluid or swelling. No visible canal hematoma. Disc levels: Moderate disc height loss and uncovertebral hypertrophy at C5-C6 and C6-C7, unchanged. Upper chest: Small left apical pneumothorax. Other: None. IMPRESSION: 1. 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 2. No acute cervical spine fracture. Stable moderate degenerative changes at C5-C6 and C6-C7. 3. Small left apical pneumothorax. Electronically Signed   By: Titus Dubin M.D.   On: 12/17/2019 13:09   CT Cervical Spine Wo Contrast  Result Date: 12/17/2019 CLINICAL DATA:  Altered mental status since fall yesterday. EXAM: CT HEAD WITHOUT CONTRAST CT CERVICAL SPINE WITHOUT CONTRAST TECHNIQUE: Multidetector CT imaging of the head and cervical spine was performed following the standard protocol without intravenous contrast. Multiplanar CT image reconstructions of the cervical spine were also generated. COMPARISON:  CT head and cervical spine dated January 15, 2018. 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 No evidence of hemorrhage, hydrocephalus, extra-axial collection or mass lesion/mass effect.  Stable moderate atrophy and mild chronic microvascular ischemic changes. Vascular: Atherosclerotic vascular calcification of the carotid siphons. No hyperdense vessel. Skull: Normal. Negative for fracture or focal lesion. Sinuses/Orbits: No acute finding. Other: None. CT CERVICAL SPINE FINDINGS Alignment: Normal. Skull base and vertebrae: No acute fracture. No primary bone lesion or focal pathologic process. Soft tissues and spinal canal: No prevertebral fluid or swelling. No visible canal hematoma. Disc levels: Moderate disc height loss and uncovertebral hypertrophy at C5-C6 and C6-C7, unchanged. Upper chest: Small left apical pneumothorax. Other: None. IMPRESSION: 1. 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 2. No acute cervical spine fracture. Stable moderate degenerative changes at C5-C6 and C6-C7. 3. Small left apical pneumothorax. Electronically Signed   By: Titus Dubin M.D.   On: 12/17/2019 13:09   DG Chest Portable 1 View  Result Date: 12/17/2019 CLINICAL DATA:  Fall.  Confusion.  Chest pain. EXAM: PORTABLE CHEST 1 VIEW COMPARISON:  09/01/2018 FINDINGS: Previous median sternotomy and aortic valve replacement. Mild cardiac enlargement. Hiatal hernia is noted. The right chest is clear. There are fractures of the right lateral ribs, at least rib numbers 4, 5, 6, 7 and 8. There is a left pneumothorax without tension, estimated at 10%. IMPRESSION: Left-sided rib fractures 4 through 8. Left pneumothorax without tension, estimated at 10%. Previous aortic valve replacement. Hiatal hernia. Electronically Signed   By: Nelson Chimes M.D.   On: 12/17/2019 11:13      Assessment/Plan Hx of NSTEMI 2012 w/ normal coronaries on cath Hx of aortic stenosis s/p AV replacement with tissue valve 2012 HTN HLD COPD Hx of laryngeal cancer GERD T2DM H/O multiple falls with multiple bony fractures  Ground level fall L ribs 4-8 with small left PTX - recommend  follow up CXR tomorrow AM.  Current PTX is only 10% and does not require chest tube placement -bilateral rhonchi noted and felt to be from pre-existing COPD, etc - pulmonary toilet, IS, flutter valve, nebulizer, multimodal pain control -PT/OT eval for mobilization  Recommend admission to medical service given multiple medical co-morbidities and hx of frequent falls. Trauma will follow for helping manage rib pain and PTX. No chest tube needed at this time.  Called after evaluation as patient having further desats.  Will follow for CXR, but CT of C-spine done at 1300 still showed small PTX.  Suspect this is from his underlying COPD and limited effective breathing secondary to pain from his rib fractures.  Doubt he will need a chest tube.  Henreitta Cea 3:56 PM 12/17/2019 Please see Amion for pager number from 7:00-4:30 on weekdays and 7:00-11:30am on the weekends

## 2019-12-17 NOTE — Consult Note (Signed)
Cardiology Consultation:   Patient ID: Casey Gilmore MRN: [id number]; DOB: 12-Nov-1941  Admit date: 12/17/2019 Date of Consult: 12/17/2019  Primary Care Provider: Mayra Neer, MD Primary Cardiologist: No primary care provider on file.  Primary Electrophysiologist:  None    Patient Profile:   Casey Gilmore is a 79 y.o. 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 Lorin Mercy.  History of Present Illness:   Casey Gilmore is a 79 yo male with PMH noted above. He has been seen remotely by Dr. Radford Pax in the past. Underwent AVR (biopros) back in 2012. Most recently seen back in 04/2018 when he presented with a left hip fracture. Cardiology was consulted for preop evaluation. Echo at that time showed normal EF and normal functioning AVR. Appears he was discharged to [facility] at that time. Presented again for a fall in 07/2018 and notes indicate he may have had some AFib with EMS on arrival but none while inpatient.   Presented to the ED today from home after another fall. He is uncertain about the events surrounding fall. Noted indicate he was found down in the bathroom by his brother this morning. EMS was called. His CBG was 44. Family indicates he does not normally have memory issues.   In the ED he was found to have a small pneumothorax with rib fractures. Labs showed K +3.4, Cr 1.68, hsTn 145>>128, WBC 16.2, Hgb 11.1. EKG showed Afib rate 105. He was seen by trauma with plans to medically manage PTC for now. No chest tube needed. He does report some chest pain with inspiration at the time of assessment.   Heart Pathway Score:     Past Medical History:  Diagnosis Date  . Anemia    2012  . Arthritis   . Blood transfusion   . 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 Closed fracture of left distal radius and ulna 01/19/2018  . Closed fracture of left proximal humerus 01/19/2018  . COPD (chronic obstructive pulmonary  disease) ([place])   . Diabetes mellitus   . GERD (gastroesophageal reflux disease)   . Heart murmur   . Hiatal hernia   . Hyperlipidemia   . Hypertension   . NSTEMI (non-ST elevated myocardial infarction) ([place]) 07/2011   in setting of Pneumonia with normal coronary arteries on cath  . Pneumonia hx 8/12  . Severe aortic stenosis 2012   s/p bioprosthetic AVR  . Shortness of breath    with exertion  . Upper GI bleeding     Past Surgical History:  Procedure Laterality Date  . AORTIC VALVE REPLACEMENT  10/15/2011   Procedure: AORTIC VALVE REPLACEMENT (AVR);  Surgeon: Tharon Aquas Adelene Idler, MD;  Location: [facility];  Service: Open Heart Surgery;  Laterality: N/A;  . CARDIAC CATHETERIZATION  results on chart   2012 w no significant disease,echo with Severe Aortic stenosis,LVH,Diastolic dysfunction.   . COLONOSCOPY  12/2004  . EYE SURGERY  bil cat 08  . FRACTURE SURGERY  pin in lft hip  . JOINT REPLACEMENT  left knee 08, rt hip 08  . LARYNGOSCOPY  01/05/2013   Procedure: LARYNGOSCOPY;  Surgeon: Melida Quitter, MD;  Location: [facility];  Service: ENT;  Laterality: N/A;  micro direct laryngoscopy with biopsy, possible co2 laser, small laser-safe endotracheal tube  . LARYNGOSCOPY N/A 02/08/2013   Procedure: LARYNGOSCOPY;  Surgeon: Melida Quitter, MD;  Location: [facility];  Service: ENT;  Laterality: N/A;  Micro Direct Laryngoscopy with Co2 laser with re-excision right vocal cord lesion.  . OPEN REDUCTION INTERNAL FIXATION (ORIF) DISTAL RADIAL FRACTURE Left 01/19/2018   Procedure: OPEN REDUCTION INTERNAL FIXATION (ORIF) DISTAL RADIAL FRACTURE;  Surgeon: Marchia Bond, MD;  Location: [facility];  Service: Orthopedics;  Laterality: Left;  . ORIF FEMUR FRACTURE Left 04/18/2018   Procedure: OPEN REDUCTION INTERNAL FIXATION PROXIMAL FEMORAL SHAFT FRACTURE...REMOVAL OF HARDWARE LEFT FEMUR (HIP FUSION PLATE);  Surgeon: Altamese Mount Blanchard, MD;  Location: [facility];  Service: Orthopedics;  Laterality: Left;  . ORIF HUMERUS FRACTURE Left  01/19/2018   Procedure: OPEN REDUCTION INTERNAL FIXATION (ORIF) PROXIMAL HUMERUS FRACTURE;  Surgeon: Marchia Bond, MD;  Location: [facility];  Service: Orthopedics;  Laterality: Left;  . TONSILLECTOMY    . TOTAL HIP ARTHROPLASTY Right   . TOTAL KNEE ARTHROPLASTY Left   . 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 Authorizing Provider  ferrous sulfate 325 (65 FE) MG tablet Take 1 tablet (325 mg total) by mouth daily with breakfast. 07/29/18 12/17/19 Yes Adhikari, Tamsen Meek, MD  HYDROcodone-acetaminophen (NORCO/VICODIN) 5-325 MG tablet Take 1 tablet by mouth 2 (two) times daily as needed. 11/13/19  Yes [provider]  Insulin Degludec-Liraglutide (XULTOPHY) 100-3.6 UNIT-MG/ML SOPN Inject 40 Units into the skin daily.   Yes [provider]  metFORMIN (GLUCOPHAGE) 1000 MG tablet Take 1,000 mg by mouth 2 (two) times daily with a meal.   Yes [provider]  methocarbamol (ROBAXIN) 500 MG tablet Take 1 tablet (500 mg total) by mouth every 6 (six) hours as needed for muscle spasms. 05/11/18  Yes Angiulli, Lavon Paganini, PA-C  pantoprazole (PROTONIX) 40 MG tablet Take 1 tablet (40 mg total) by mouth daily. 05/12/18  Yes Angiulli, Lavon Paganini, PA-C    Inpatient Medications: Scheduled Meds: . acetaminophen  650 mg Oral Q6H  . docusate sodium  100 mg Oral BID  . insulin aspart  0-15 Units Subcutaneous TID WC  . ipratropium  0.5 mg Nebulization Q6H  . lidocaine-EPINEPHrine  10 mL Intradermal Once  . methocarbamol  500 mg Oral TID  . [START ON 12/18/2019] pantoprazole  40 mg Oral Daily  . sodium chloride flush  3 mL Intravenous Q12H   Continuous Infusions: . diltiazem (CARDIZEM) infusion 5 mg/hr (12/17/19 1659)   PRN Meds: acetaminophen **OR** acetaminophen, morphine injection, ondansetron **OR** ondansetron (ZOFRAN) IV,  oxyCODONE  Allergies:   No Known Allergies  Social History:   Social History   Socioeconomic History  . Marital status: Single    Spouse name: Not on file  . Number of children: Not on file  . Years of education: Not on file  . Highest education level: Not on file  Occupational History  . Not on file  Tobacco Use  . Smoking status: Former Smoker    Packs/day: 2.00    Years: 25.00    Pack years: 50.00    Types: Cigarettes    Quit date: 12/06/1978    Years since quitting: 41.0  . Smokeless tobacco: Never Used  Substance and Sexual Activity  . Alcohol use: No  . Drug use: No  . Sexual activity: Not on file  Other Topics Concern  . Not on file  Social History Narrative  . Not on file   Social Determinants of Health  Financial Resource Strain:   . Difficulty of Paying Living Expenses: Not on file  Food Insecurity:   . Worried About Charity fundraiser in the Last Year: Not on file  . Ran Out of Food in the Last Year: Not on file  Transportation Needs:   . Lack of Transportation (Medical): Not on file  . Lack of Transportation (Non-Medical): Not on file  Physical Activity:   . Days of Exercise per Week: Not on file  . Minutes of Exercise per Session: Not on file  Stress:   . Feeling of Stress : Not on file  Social Connections:   . Frequency of Communication with Friends and Family: Not on file  . Frequency of Social Gatherings with Friends and Family: Not on file  . Attends Religious Services: Not on file  . Active Member of Clubs or Organizations: Not on file  . Attends Archivist Meetings: Not on file  . Marital Status: Not on file  Intimate Partner Violence:   . Fear of Current or Ex-Partner: Not on file  . Emotionally Abused: Not on file  . Physically Abused: Not on file  . Sexually Abused: Not on file    Family History:   Family History  Problem Relation Age of Onset  . Hypertension Father      ROS:  Please see the history of present  illness.   All other ROS reviewed and negative.     Physical Exam/Data:   Vitals:   12/17/19 1245 12/17/19 1330 12/17/19 1505 12/17/19 1530  BP: 105/82  (!) 141/84 134/83  Pulse: (!) 101 76 96 65  Resp: (!) 26 (!) 26 (!) 27 (!) 31  Temp:      TempSrc:      SpO2: 96% 96% 90% 93%  Weight:       No intake or output data in the 24 hours ending 12/17/19 1705 Last 3 Weights 12/17/2019 07/26/2018 04/24/2018  Weight (lbs) 162 lb 14.7 oz 162 lb 7.7 oz 173 lb 8 oz  Weight (kg) 73.9 kg 73.7 kg 78.7 kg     Body mass index is 22.1 kg/m.   Physical Exam per MD.  EKG:  The EKG was personally reviewed and demonstrates:  Afib, RBBB Rate 105  Relevant CV Studies:  TTE: 04/2018  Study Conclusions  - Left ventricle: The cavity size was normal. Wall thickness was   increased in a pattern of mild LVH. Systolic function was normal.   The estimated ejection fraction was in the range of 60% to 65%. - Aortic valve: Bioprosthetic valve with acceptable gradients DVI   .5 suggesting normal function and no significant peri valvular   regurgiation. Valve area (VTI): 0.88 cm^2. Valve area (Vmax):   0.77 cm^2. Valve area (Vmean): 0.73 cm^2. - Mitral valve: Valve area by continuity equation (using LVOT   flow): 0.83 cm^2. - Atrial septum: No defect or patent foramen ovale was identified.   Laboratory Data:  High Sensitivity Troponin:   Recent Labs  Lab 12/17/19 0955 12/17/19 1420  TROPONINIHS 145* 128*     Chemistry Recent Labs  Lab 12/17/19 0955  NA 143  K 3.4*  CL 100  CO2 26  GLUCOSE 53*  BUN 22  CREATININE 1.68*  CALCIUM 6.5*  GFRNONAA 38*  GFRAA 44*  ANIONGAP 17*    No results for input(s): PROT, ALBUMIN, AST, ALT, ALKPHOS, BILITOT in the last 168 hours. Hematology Recent Labs  Lab 12/17/19 0955  WBC 16.2*  RBC  4.85  HGB 11.1*  HCT 37.4*  MCV 77.1*  MCH 22.9*  MCHC 29.7*  RDW 19.3*  PLT 420*   BNPNo results for input(s): BNP, PROBNP in the last 168 hours.   DDimer No results for input(s): DDIMER in the last 168 hours.   Radiology/Studies:  CT Head Wo Contrast  Result Date: 12/17/2019 CLINICAL DATA:  Altered mental status since fall yesterday. EXAM: CT HEAD WITHOUT CONTRAST CT CERVICAL SPINE WITHOUT CONTRAST TECHNIQUE: Multidetector CT imaging of the head and cervical spine was performed following the standard protocol without intravenous contrast. Multiplanar CT image reconstructions of the cervical spine were also generated. COMPARISON:  CT head and cervical spine dated January 15, 2018. 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 No evidence of hemorrhage, hydrocephalus, extra-axial collection or mass lesion/mass effect. Stable moderate atrophy and mild chronic microvascular ischemic changes. Vascular: Atherosclerotic vascular calcification of the carotid siphons. No hyperdense vessel. Skull: Normal. Negative for fracture or focal lesion. Sinuses/Orbits: No acute finding. Other: None. CT CERVICAL SPINE FINDINGS Alignment: Normal. Skull base and vertebrae: No acute fracture. No primary bone lesion or focal pathologic process. Soft tissues and spinal canal: No prevertebral fluid or swelling. No visible canal hematoma. Disc levels: Moderate disc height loss and uncovertebral hypertrophy at C5-C6 and C6-C7, unchanged. Upper chest: Small left apical pneumothorax. Other: None. IMPRESSION: 1. 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 2. No acute cervical spine fracture. Stable moderate degenerative changes at C5-C6 and C6-C7. 3. Small left apical pneumothorax. Electronically Signed   By: Titus Dubin M.D.   On: 12/17/2019 13:09   CT Cervical Spine Wo Contrast  Result Date: 12/17/2019 CLINICAL DATA:  Altered mental status since fall yesterday. EXAM: CT HEAD WITHOUT CONTRAST CT CERVICAL SPINE WITHOUT CONTRAST TECHNIQUE:  Multidetector CT imaging of the head and cervical spine was performed following the standard protocol without intravenous contrast. Multiplanar CT image reconstructions of the cervical spine were also generated. COMPARISON:  CT head and cervical spine dated January 15, 2018. 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 No evidence of hemorrhage, hydrocephalus, extra-axial collection or mass lesion/mass effect. Stable moderate atrophy and mild chronic microvascular ischemic changes. Vascular: Atherosclerotic vascular calcification of the carotid siphons. No hyperdense vessel. Skull: Normal. Negative for fracture or focal lesion. Sinuses/Orbits: No acute finding. Other: None. CT CERVICAL SPINE FINDINGS Alignment: Normal. Skull base and vertebrae: No acute fracture. No primary bone lesion or focal pathologic process. Soft tissues and spinal canal: No prevertebral fluid or swelling. No visible canal hematoma. Disc levels: Moderate disc height loss and uncovertebral hypertrophy at C5-C6 and C6-C7, unchanged. Upper chest: Small left apical pneumothorax. Other: None. IMPRESSION: 1. 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 2. No acute cervical spine fracture. Stable moderate degenerative changes at C5-C6 and C6-C7. 3. Small left apical pneumothorax. Electronically Signed   By: Titus Dubin M.D.   On: 12/17/2019 13:09   DG CHEST PORT 1 VIEW  Result Date: 12/17/2019 CLINICAL DATA:  Acute respiratory failure EXAM: PORTABLE CHEST 1 VIEW COMPARISON:  Earlier same day FINDINGS: Multiple left rib fractures are again identified with adjacent subcutaneous emphysema. Probable small left apical pneumothorax remains unchanged. Patchy left lung density, which may reflect atelectasis/contusion. Stable cardiomediastinal contours with evidence of aortic valve replacement. IMPRESSION: Probable small left  apical pneumothorax remains unchanged.  Patchy left lung density probably reflecting atelectasis/contusion. Electronically Signed   By: Macy Mis M.D.   On: 12/17/2019 16:59   DG Chest Portable 1 View  Result Date: 12/17/2019 CLINICAL DATA:  Fall.  Confusion.  Chest pain. EXAM: PORTABLE CHEST 1 VIEW COMPARISON:  09/01/2018 FINDINGS: Previous median sternotomy and aortic valve replacement. Mild cardiac enlargement. Hiatal hernia is noted. The right chest is clear. There are fractures of the right lateral ribs, at least rib numbers 4, 5, 6, 7 and 8. There is a left pneumothorax without tension, estimated at 10%. IMPRESSION: Left-sided rib fractures 4 through 8. Left pneumothorax without tension, estimated at 10%. Previous aortic valve replacement. Hiatal hernia. Electronically Signed   By: Nelson Chimes M.D.   On: 12/17/2019 11:13   DG Hip [place] W or Wo Pelvis 1 View Left  Result Date: 12/17/2019 CLINICAL DATA:  Left hip pain after fall. EXAM: DG HIP (WITH OR WITHOUT PELVIS) 1V PORT LEFT COMPARISON:  Apr 18, 2018. FINDINGS: Status post surgical fusion of the left hip joint. Status post surgical internal fixation of proximal left femoral fracture. Status post right total hip arthroplasty. No definite acute fracture or dislocation is noted. IMPRESSION: Postsurgical changes as described above. No definite acute abnormality is noted. Electronically Signed   By: Marijo Conception M.D.   On: 12/17/2019 13:54    Assessment and Plan:   Casey Gilmore is a 79 y.o. 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 Lorin Mercy.  1. Afib RVR: seems this is new onset, though notes do mention he may have had an episode back in 2019 after a fall. His rate is around 110-120 while in the ED. Orders to start IV Cardizem per admitting MD.  -- check echo, TSH -- suspect he could transition to oral dosing if rates remain controlled --  This patients CHA2DS2-VASc Score of at least 4 but would consider a poor candidate for [facility] with his hx of freq falls and GIB. Would focus on rate control.  2. Fall with rib fractures and PTX: seen by trauma with plans to manage medically.   3. DM: CBG was 44 on EMS arrival. Managed with metformin and Xultophy prior to admission. Defer changes to primary  4. AS s/p AVR: done in 2012. Last echo 2019 showed normal function.  -- echo as above   For questions or updates, please contact [facility] Please consult [email or web address] for contact info under     Signed, Reino Bellis, NP  12/17/2019 5:05 PM

## 2019-12-17 NOTE — ED Notes (Signed)
Pt taken to CT.

## 2019-12-17 NOTE — ED Notes (Signed)
Pt drank 8oz of OJ

## 2019-12-17 NOTE — ED Provider Notes (Signed)
[facility] EMERGENCY DEPARTMENT Provider Note   CSN: [id number] Arrival date & time: 12/17/19  [id number]     History Chief Complaint  Patient presents with  . Fall  . Altered Mental Status   Level 5 caveat due to altered mental status. Casey Gilmore is a 79 y.o. male with history of COPD, diabetes mellitus, hypertension, hyperlipidemia, NSTEMI, aortic stenosis, upper GI bleed presents brought in by EMS for evaluation of altered mental status after fall.  Per EMS patient fell around 3 PM yesterday found on the bathroom floor by family near the commode.  He is able to ambulate but prefers wheelchair per family.  He has a rod to the left femur with chronic limited range of motion and shortened left lower extremity per family.  He has a superficial abrasion to the right forehead which was dressed with butterfly strips prior to arrival in the ED.  Family notes that he is on a blood thinner but is unsure which 1 and I do not see any blood thinners listed on his medication list currently.  Per family they noted that the patient has been confused and weak since the fall and does not remember the fall.  The patient tells me he does not remember the fall.  He is oriented to person and place but not time or events leading up to his presentation to the ED.  He is complaining of some left-sided chest wall pain.  He denies headache, vision changes, numbness or weakness of the extremities, shortness of breath, abdominal pain, nausea or vomiting.   The history is provided by the patient, a relative and the EMS personnel. The history is limited by the condition of the patient.       Past Medical History:  Diagnosis Date  . Anemia    2012  . Arthritis   . Blood transfusion   . 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 Closed fracture of left distal radius and ulna 01/19/2018  . Closed fracture of left proximal humerus 01/19/2018  . COPD (chronic obstructive pulmonary disease) ([place])   . Diabetes  mellitus   . GERD (gastroesophageal reflux disease)   . Heart murmur   . Hiatal hernia   . Hyperlipidemia   . Hypertension   . NSTEMI (non-ST elevated myocardial infarction) ([place]) 07/2011   in setting of Pneumonia with normal coronary arteries on cath  . Pneumonia hx 8/12  . Severe aortic stenosis 2012   s/p bioprosthetic AVR  . Shortness of breath    with exertion  . Upper GI bleeding     Patient Active Problem List   Diagnosis Date Noted  . Back pain 07/26/2018  . Frequent falls   . Leukocytosis   . Right hip pain   . Orthostatic hypotension   . Chronic pain syndrome   . Constipation due to pain medication   . Postoperative pain   . Hypoalbuminemia due to protein-calorie malnutrition ([place])   . Closed left subtrochanteric femur fracture ([place]) 04/24/2018  . Acute blood loss anemia   . Anemia of chronic disease   . Diabetes mellitus type 2 in nonobese ([facility])   . Hyperlipidemia   . Aortic valve stenosis   . History of GI bleed   . Anemia 04/17/2018  . Closed left hip fracture ([place]) 04/15/2018  . Closed fracture of left proximal humerus 01/19/2018  . Closed fracture of left distal radius and ulna 01/19/2018  . 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  COPD (chronic obstructive pulmonary disease) with chronic bronchitis ([place]) 10/29/2014  . Chronic diastolic heart failure ([place]) 06/24/2014  . Benign essential HTN 06/24/2014  . 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 History of non-ST elevation myocardial infarction (NSTEMI) 06/24/2014  . Diabetes mellitus ([place]) 10/17/2011  . Osteoarthritis 10/17/2011    Past Surgical History:  Procedure Laterality Date  . AORTIC VALVE REPLACEMENT  10/15/2011   Procedure: AORTIC VALVE REPLACEMENT (AVR);  Surgeon: Tharon Aquas Adelene Idler, MD;  Location: [facility];  Service: Open Heart Surgery;  Laterality: N/A;  . CARDIAC CATHETERIZATION  results on chart   2012 w no significant disease,echo with Severe Aortic stenosis,LVH,Diastolic  dysfunction.   . COLONOSCOPY  12/2004  . EYE SURGERY  bil cat 08  . FRACTURE SURGERY  pin in lft hip  . JOINT REPLACEMENT  left knee 08, rt hip 08  . LARYNGOSCOPY  01/05/2013   Procedure: LARYNGOSCOPY;  Surgeon: Melida Quitter, MD;  Location: [facility];  Service: ENT;  Laterality: N/A;  micro direct laryngoscopy with biopsy, possible co2 laser, small laser-safe endotracheal tube  . LARYNGOSCOPY N/A 02/08/2013   Procedure: LARYNGOSCOPY;  Surgeon: Melida Quitter, MD;  Location: [facility];  Service: ENT;  Laterality: N/A;  Micro Direct Laryngoscopy with Co2 laser with re-excision right vocal cord lesion.  . OPEN REDUCTION INTERNAL FIXATION (ORIF) DISTAL RADIAL FRACTURE Left 01/19/2018   Procedure: OPEN REDUCTION INTERNAL FIXATION (ORIF) DISTAL RADIAL FRACTURE;  Surgeon: Marchia Bond, MD;  Location: [facility];  Service: Orthopedics;  Laterality: Left;  . ORIF FEMUR FRACTURE Left 04/18/2018   Procedure: OPEN REDUCTION INTERNAL FIXATION PROXIMAL FEMORAL SHAFT FRACTURE...REMOVAL OF HARDWARE LEFT FEMUR (HIP FUSION PLATE);  Surgeon: Altamese Cove, MD;  Location: [facility];  Service: Orthopedics;  Laterality: Left;  . ORIF HUMERUS FRACTURE Left 01/19/2018   Procedure: OPEN REDUCTION INTERNAL FIXATION (ORIF) PROXIMAL HUMERUS FRACTURE;  Surgeon: Marchia Bond, MD;  Location: [facility];  Service: Orthopedics;  Laterality: Left;  . TONSILLECTOMY    . TOTAL HIP ARTHROPLASTY Right   . TOTAL KNEE ARTHROPLASTY Left   . 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  Social History   Tobacco Use  . Smoking status: Former Smoker    Packs/day: 2.00    Years: 25.00    Pack years: 50.00    Types: Cigarettes    Quit date: 12/06/1978    Years since quitting: 41.0  . Smokeless tobacco: Never Used  Substance Use Topics  . Alcohol use: No  . Drug use: No    Home Medications Prior to Admission medications   Medication Sig Start Date End Date Taking?  Authorizing Provider  ferrous sulfate 325 (65 FE) MG tablet Take 1 tablet (325 mg total) by mouth daily with breakfast. 07/29/18 12/17/19 Yes Adhikari, Tamsen Meek, MD  HYDROcodone-acetaminophen (NORCO/VICODIN) 5-325 MG tablet Take 1 tablet by mouth 2 (two) times daily as needed. 11/13/19  Yes [provider]  Insulin Degludec-Liraglutide (XULTOPHY) 100-3.6 UNIT-MG/ML SOPN Inject 40 Units into the skin daily.   Yes [provider]  metFORMIN (GLUCOPHAGE) 1000 MG tablet Take 1,000 mg by mouth 2 (two) times daily with a meal.   Yes [provider]  methocarbamol (ROBAXIN) 500 MG tablet Take 1 tablet (500 mg total) by mouth every 6 (six) hours as needed for muscle spasms. 05/11/18  Yes Angiulli, Lavon Paganini, PA-C  pantoprazole (PROTONIX) 40 MG tablet Take 1 tablet (40 mg  total) by mouth daily. 05/12/18  Yes Angiulli, Lavon Paganini, PA-C  lidocaine (LIDODERM) 5 % Place 1 patch onto the skin daily. Remove & Discard patch within 12 hours or as directed by MD Patient not taking: Reported on 12/17/2019 07/29/18   Shelly Coss, MD  oxyCODONE (OXY IR/ROXICODONE) 5 MG immediate release tablet Take 1 tablet (5 mg total) by mouth every 6 (six) hours as needed for moderate pain. Patient not taking: Reported on 12/17/2019 07/28/18   Shelly Coss, MD  Vitamin D, Ergocalciferol, (DRISDOL) 50000 units CAPS capsule Take 1 capsule (50,000 Units total) by mouth every 7 (seven) days. Patient not taking: Reported on 12/17/2019 05/14/18   Angiulli, Lavon Paganini, PA-C    Allergies    Patient has no known allergies.  Review of Systems   Review of Systems  Unable to perform ROS: Mental status change    Physical Exam Updated Vital Signs BP (!) 141/84   Pulse 96   Temp 99.1 F (37.3 C) (Oral)   Resp (!) 27   Wt 73.9 kg   SpO2 90%   BMI 22.10 kg/m   Physical Exam Vitals and nursing note reviewed.  Constitutional:      General: He is not in acute distress.    Appearance: He is well-developed.  HENT:      Head: Normocephalic.     Comments: Superficial laceration to the right forehead superior to the right eyebrow.  Bleeding controlled.  Butterfly strips have been placed prior to arrival Eyes:     General:        Right eye: No discharge.        Left eye: No discharge.     Extraocular Movements: Extraocular movements intact.     Conjunctiva/sclera: Conjunctivae normal.     Pupils: Pupils are equal, round, and reactive to light.  Neck:     Vascular: No JVD.     Trachea: No tracheal deviation.     Comments: c-collar applied Cardiovascular:     Rate and Rhythm: Tachycardia present.     Pulses: Normal pulses.     Comments: 2+ radial and DP/PT pulses bilaterally, Homans sign absent bilaterally, no lower extremity edema, no palpable cords, compartments are soft  Pulmonary:     Effort: Pulmonary effort is normal.     Comments: Scattered rhonchi.  No ecchymosis, crepitus, deformity, or flail segment noted on examination of the chest wall but he does have tenderness to palpation of the left lateral chest wall. Chest:     Chest wall: Tenderness present.    Abdominal:     General: Abdomen is flat. Bowel sounds are increased. There is no distension.     Tenderness: There is no abdominal tenderness. There is no guarding or rebound.  Musculoskeletal:     Cervical back: Neck supple.     Comments: Pelvis appears stable.  Left lower extremity is chronically shortened with limited range of motion.  He is able to plantar flex and dorsiflex both feet against resistance without difficulty.  4/5 strength of bilateral upper extremity major muscle groups and right lower extremity major muscle groups.  No midline spine tenderness.  Skin:    General: Skin is warm and dry.     Capillary Refill: Capillary refill takes less than 2 seconds.     Findings: No erythema.  Neurological:     Mental Status: He is alert. He is confused.     GCS: GCS eye subscore is 4. GCS verbal subscore is 4. GCS motor  subscore is 6.      Cranial Nerves: Cranial nerves are intact.     Comments: No aphasia noted.  Patient follows most commands without difficulty. Moves all extremities spontaneously except for left lower extremity which reportedly is baseline due to repair after prior femur fracture. Sensation intact to light touch of face and extremities. Cranial nerves appear grossly intact.  Psychiatric:        Behavior: Behavior normal.     ED Results / Procedures / Treatments   Labs (all labs ordered are listed, but only abnormal results are displayed) Labs Reviewed  BASIC METABOLIC PANEL - Abnormal; Notable for the following components:      Result Value   Potassium 3.4 (*)    Glucose, Bld 53 (*)    Creatinine, Ser 1.68 (*)    Calcium 6.5 (*)    GFR calc non Af Amer 38 (*)    GFR calc Af Amer 44 (*)    Anion gap 17 (*)    All other components within normal limits  CBC WITH DIFFERENTIAL/PLATELET - Abnormal; Notable for the following components:   WBC 16.2 (*)    Hemoglobin 11.1 (*)    HCT 37.4 (*)    MCV 77.1 (*)    MCH 22.9 (*)    MCHC 29.7 (*)    RDW 19.3 (*)    Platelets 420 (*)    Neutro Abs 14.6 (*)    Basophils Absolute 0.2 (*)    All other components within normal limits  URINALYSIS, ROUTINE W REFLEX MICROSCOPIC - Abnormal; Notable for the following components:   APPearance HAZY (*)    Hgb urine dipstick SMALL (*)    Leukocytes,Ua TRACE (*)    All other components within normal limits  CBG MONITORING, ED - Abnormal; Notable for the following components:   Glucose-Capillary 43 (*)    All other components within normal limits  CBG MONITORING, ED - Abnormal; Notable for the following components:   Glucose-Capillary 60 (*)    All other components within normal limits  TROPONIN I (HIGH SENSITIVITY) - Abnormal; Notable for the following components:   Troponin I (High Sensitivity) 145 (*)    All other components within normal limits  TROPONIN I (HIGH SENSITIVITY) - Abnormal; Notable for the  following components:   Troponin I (High Sensitivity) 128 (*)    All other components within normal limits  URINE CULTURE  SARS CORONAVIRUS 2 (TAT 6-24 HRS)  CBG MONITORING, ED  CBG MONITORING, ED  POC SARS CORONAVIRUS 2 AG -  ED  CBG MONITORING, ED    EKG EKG Interpretation  Date/Time:  Monday December 17 2019 09:41:58 EST Ventricular Rate:  130 PR Interval:    QRS Duration: 165 QT Interval:  368 QTC Calculation: 542 R Axis:   -78 Text Interpretation: Sinus tachycardia Multiform ventricular premature complexes RBBB and LAFB Artifact in lead(s) I II III aVR aVL aVF V1 V2 V3 V4 V5 V6 TECHNICALLY DIFFICULT Otherwise no significant change Confirmed by Deno Etienne [phone]) on 12/17/2019 9:51:08 AM Also confirmed by Deno Etienne [phone]), editor Hattie Perch [phone])  on 12/17/2019 10:36:30 AM   Radiology CT Head Wo Contrast  Result Date: 12/17/2019 CLINICAL DATA:  Altered mental status since fall yesterday. EXAM: CT HEAD WITHOUT CONTRAST CT CERVICAL SPINE WITHOUT CONTRAST TECHNIQUE: Multidetector CT imaging of the head and cervical spine was performed following the standard protocol without intravenous contrast. Multiplanar CT image reconstructions of the cervical spine were also generated. COMPARISON:  CT head  and cervical spine dated January 15, 2018. 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 No evidence of hemorrhage, hydrocephalus, extra-axial collection or mass lesion/mass effect. Stable moderate atrophy and mild chronic microvascular ischemic changes. Vascular: Atherosclerotic vascular calcification of the carotid siphons. No hyperdense vessel. Skull: Normal. Negative for fracture or focal lesion. Sinuses/Orbits: No acute finding. Other: None. CT CERVICAL SPINE FINDINGS Alignment: Normal. Skull base and vertebrae: No acute fracture. No primary bone lesion or focal pathologic process. Soft tissues and  spinal canal: No prevertebral fluid or swelling. No visible canal hematoma. Disc levels: Moderate disc height loss and uncovertebral hypertrophy at C5-C6 and C6-C7, unchanged. Upper chest: Small left apical pneumothorax. Other: None. IMPRESSION: 1. 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 2. No acute cervical spine fracture. Stable moderate degenerative changes at C5-C6 and C6-C7. 3. Small left apical pneumothorax. Electronically Signed   By: Titus Dubin M.D.   On: 12/17/2019 13:09   CT Cervical Spine Wo Contrast  Result Date: 12/17/2019 CLINICAL DATA:  Altered mental status since fall yesterday. EXAM: CT HEAD WITHOUT CONTRAST CT CERVICAL SPINE WITHOUT CONTRAST TECHNIQUE: Multidetector CT imaging of the head and cervical spine was performed following the standard protocol without intravenous contrast. Multiplanar CT image reconstructions of the cervical spine were also generated. COMPARISON:  CT head and cervical spine dated January 15, 2018. 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 No evidence of hemorrhage, hydrocephalus, extra-axial collection or mass lesion/mass effect. Stable moderate atrophy and mild chronic microvascular ischemic changes. Vascular: Atherosclerotic vascular calcification of the carotid siphons. No hyperdense vessel. Skull: Normal. Negative for fracture or focal lesion. Sinuses/Orbits: No acute finding. Other: None. CT CERVICAL SPINE FINDINGS Alignment: Normal. Skull base and vertebrae: No acute fracture. No primary bone lesion or focal pathologic process. Soft tissues and spinal canal: No prevertebral fluid or swelling. No visible canal hematoma. Disc levels: Moderate disc height loss and uncovertebral hypertrophy at C5-C6 and C6-C7, unchanged. Upper chest: Small left apical pneumothorax. Other: None. IMPRESSION: 1. Small age indeterminate lacunar  infarcts in the right cerebellum, new since February 2019. 2. No acute cervical spine fracture. Stable moderate degenerative changes at C5-C6 and C6-C7. 3. Small left apical pneumothorax. Electronically Signed   By: Titus Dubin M.D.   On: 12/17/2019 13:09   DG Chest Portable 1 View  Result Date: 12/17/2019 CLINICAL DATA:  Fall.  Confusion.  Chest pain. EXAM: PORTABLE CHEST 1 VIEW COMPARISON:  09/01/2018 FINDINGS: Previous median sternotomy and aortic valve replacement. Mild cardiac enlargement. Hiatal hernia is noted. The right chest is clear. There are fractures of the right lateral ribs, at least rib numbers 4, 5, 6, 7 and 8. There is a left pneumothorax without tension, estimated at 10%. IMPRESSION: Left-sided rib fractures 4 through 8. Left pneumothorax without tension, estimated at 10%. Previous aortic valve replacement. Hiatal hernia. Electronically Signed   By: Nelson Chimes M.D.   On: 12/17/2019 11:13   DG Hip Port [place] W or Wo Pelvis 1 View Left  Result Date: 12/17/2019 CLINICAL DATA:  Left hip pain after fall. EXAM: DG HIP (WITH OR WITHOUT PELVIS) 1V PORT LEFT COMPARISON:  Apr 18, 2018. FINDINGS: Status post surgical fusion of the left hip joint. Status post surgical internal fixation of proximal left femoral fracture. Status post right total hip arthroplasty. No definite acute fracture or dislocation is noted.  IMPRESSION: Postsurgical changes as described above. No definite acute abnormality is noted. Electronically Signed   By: Marijo Conception M.D.   On: 12/17/2019 13:54    Procedures .Critical Care Performed by: Renita Papa, PA-C Authorized by: Renita Papa, PA-C   Critical care provider statement:    Critical care time (minutes):  40   Critical care was necessary to treat or prevent imminent or life-threatening deterioration of the following conditions:  Respiratory failure, metabolic crisis and trauma   Critical care was time spent personally by me on the following  activities:  Discussions with consultants, evaluation of patient's response to treatment, examination of patient, ordering and performing treatments and interventions, ordering and review of laboratory studies, ordering and review of radiographic studies, pulse oximetry, re-evaluation of patient's condition, obtaining history from patient or surrogate and review of old charts   (including critical care time)  Medications Ordered in ED Medications  lidocaine-EPINEPHrine (XYLOCAINE W/EPI) 2 %-1:200000 (PF) injection 10 mL (has no administration in time range)  acetaminophen (TYLENOL) tablet 650 mg (has no administration in time range)  methocarbamol (ROBAXIN) tablet 500 mg (has no administration in time range)  oxyCODONE (Oxy IR/ROXICODONE) immediate release tablet 5-10 mg (has no administration in time range)  morphine 2 MG/ML injection 1-4 mg (has no administration in time range)  dextrose 5 %-0.45 % sodium chloride infusion (has no administration in time range)  ipratropium (ATROVENT) nebulizer solution 0.5 mg (has no administration in time range)  dextrose 50 % solution 50 mL (50 mLs Intravenous Given 12/17/19 1431)  dextrose 50 % solution 50 mL (50 mLs Intravenous Given 12/17/19 1145)    ED Course  I have reviewed the triage vital signs and the nursing notes.  Pertinent labs & imaging results that were available during my care of the patient were reviewed by me and considered in my medical decision making (see chart for details).    MDM Rules/Calculators/A&P                      Patient presenting brought in by EMS for evaluation of altered mental status after unwitnessed fall yesterday.  He is afebrile, tachypneic and tachycardic in the ED, heart rate 90-1 100s.  EKG shows A. fib, he has a history of paroxysmal A. fib.  O2 saturations down to 90% on room air, placed on 2 L supplemental oxygen via nasal cannula.  On initial assessment he is confused oriented to person and place only.  He  was found to be hypoglycemic with a blood glucose of 43 but was able to tolerate p.o. orange juice with some improvement.  On examination he has chest wall pain of the left side of the chest, no flail segment noted.  He has a superficial abrasion to the right forehead.  He has chronic left lower extremity shortening and limited range of motion at the hip due to prior surgery.  Will obtain blood work and imaging and reassess.  While in the ED patient's blood glucose has been labile, intermittently hypoglycemic so he was placed on a D5 half-normal saline drip after multiple pushes of D50.  His initial troponin is also elevated, will trend.  The EKG does not appear acutely ischemic he has a history of paroxysmal A. Fib.  Remainder of lab work reviewed by me shows leukocytosis of 16.2, hemoglobin of 11.1, elevated creatinine of 1.68 and mild hypokalemia with potassium 3.4.  Anion gap is also elevated at 17.   Head  imaging shows small age-indeterminate lacunar infarcts in the right cerebellum but unclear how long these have been present.  No cervical spine injury noted.  Chest x-ray shows evidence of rib fractures in ribs 4 through 8 and a 10% pneumothorax.  Trauma surgery was consulted and have seen the patient emergently in the ED.  They will hold off on chest tube placement at this time and recommend hospitalist admission.  POC Covid test is negative, rapid covid test obtained. Spoke with Dr. Lorin Mercy with [facility] hospitalist service who agrees to assume care of patient and bring him into the hospital for further evaluation and management.  Patient was seen and evaluated Dr. Tyrone Nine who agrees with assessment and plan at this time.   Final Clinical Impression(s) / ED Diagnoses Final diagnoses:  Closed fracture of multiple ribs of left side, initial encounter  Traumatic pneumothorax, initial encounter  Hypoglycemia  Elevated troponin  Fall, initial encounter    Rx / DC Orders ED Discharge Orders    None         Renita Papa, PA-C 12/17/19 [place], [place], DO 12/18/19 1506

## 2019-12-17 NOTE — ED Triage Notes (Signed)
Pt in from home via [facility] with AMS per family s/p fall at 3pm yesterday. Baseline a&ox4, walks. Since fall, a&ox3 and weak, does not remember fall event. Family states he takes thinner, unsure of which one. Pt in c-collar, c/o L shoulder pain and L hip pain. R eyebrow abrasion present, butterfly strip placed PTA

## 2019-12-17 NOTE — ED Notes (Signed)
[phone] -dung Krage son [phone] duane Mahadeo son

## 2019-12-18 ENCOUNTER — Inpatient Hospital Stay (HOSPITAL_COMMUNITY): Payer: Medicare Other

## 2019-12-18 ENCOUNTER — Other Ambulatory Visit: Payer: Self-pay

## 2019-12-18 DIAGNOSIS — S270XXA Traumatic pneumothorax, initial encounter: Principal | ICD-10-CM

## 2019-12-18 DIAGNOSIS — I350 Nonrheumatic aortic (valve) stenosis: Secondary | ICD-10-CM

## 2019-12-18 DIAGNOSIS — I4891 Unspecified atrial fibrillation: Secondary | ICD-10-CM

## 2019-12-18 LAB — BASIC METABOLIC PANEL WITH GFR
Anion gap: 13 (ref 5–15)
BUN: 22 mg/dL (ref 8–23)
CO2: 28 mmol/L (ref 22–32)
Calcium: 6.1 mg/dL — CL (ref 8.9–10.3)
Chloride: 101 mmol/L (ref 98–111)
Creatinine, Ser: 1.23 mg/dL (ref 0.61–1.24)
GFR calc Af Amer: >60 mL/min
GFR calc non Af Amer: 56 mL/min — ABNORMAL LOW
Glucose, Bld: 96 mg/dL (ref 70–99)
Potassium: 3 mmol/L — ABNORMAL LOW (ref 3.5–5.1)
Sodium: 142 mmol/L (ref 135–145)

## 2019-12-18 LAB — RENAL FUNCTION PANEL
Albumin: 2.5 g/dL — ABNORMAL LOW (ref 3.5–5.0)
Anion gap: 14 (ref 5–15)
BUN: 24 mg/dL — ABNORMAL HIGH (ref 8–23)
CO2: 27 mmol/L (ref 22–32)
Calcium: 6.3 mg/dL — CL (ref 8.9–10.3)
Chloride: 99 mmol/L (ref 98–111)
Creatinine, Ser: 1.26 mg/dL — ABNORMAL HIGH (ref 0.61–1.24)
GFR calc Af Amer: >60 mL/min (ref >60)
GFR calc non Af Amer: 54 mL/min — ABNORMAL LOW (ref >60)
Glucose, Bld: 195 mg/dL — ABNORMAL HIGH (ref 70–99)
Phosphorus: 3.9 mg/dL (ref 2.5–4.6)
Potassium: 3.6 mmol/L (ref 3.5–5.1)
Sodium: 140 mmol/L (ref 135–145)

## 2019-12-18 LAB — HEPATIC FUNCTION PANEL
ALT: 8 U/L (ref 0–44)
AST: 16 U/L (ref 15–41)
Albumin: 2.5 g/dL — ABNORMAL LOW (ref 3.5–5.0)
Alkaline Phosphatase: 71 U/L (ref 38–126)
Bilirubin, Direct: 0.2 mg/dL (ref 0.0–0.2)
Indirect Bilirubin: 0.9 mg/dL (ref 0.3–0.9)
Total Bilirubin: 1.1 mg/dL (ref 0.3–1.2)
Total Protein: 5.3 g/dL — ABNORMAL LOW (ref 6.5–8.1)

## 2019-12-18 LAB — URINE CULTURE: Culture: <10000 — AB

## 2019-12-18 LAB — ECHOCARDIOGRAM COMPLETE: Weight: 2606.72 oz

## 2019-12-18 LAB — CBC
HCT: 31.2 % — ABNORMAL LOW (ref 39.0–52.0)
Hemoglobin: 9.5 g/dL — ABNORMAL LOW (ref 13.0–17.0)
MCH: 22.9 pg — ABNORMAL LOW (ref 26.0–34.0)
MCHC: 30.4 g/dL (ref 30.0–36.0)
MCV: 75.4 fL — ABNORMAL LOW (ref 80.0–100.0)
Platelets: 315 10*3/uL (ref 150–400)
RBC: 4.14 MIL/uL — ABNORMAL LOW (ref 4.22–5.81)
RDW: 19 % — ABNORMAL HIGH (ref 11.5–15.5)
WBC: 12.7 10*3/uL — ABNORMAL HIGH (ref 4.0–10.5)
nRBC: 0 % (ref 0.0–0.2)

## 2019-12-18 LAB — CBG MONITORING, ED
Glucose-Capillary: 93 mg/dL (ref 70–99)
Glucose-Capillary: 103 mg/dL — ABNORMAL HIGH (ref 70–99)
Glucose-Capillary: 113 mg/dL — ABNORMAL HIGH (ref 70–99)

## 2019-12-18 LAB — GLUCOSE, CAPILLARY
Glucose-Capillary: 188 mg/dL — ABNORMAL HIGH (ref 70–99)
Glucose-Capillary: 154 mg/dL — ABNORMAL HIGH (ref 70–99)
Glucose-Capillary: 164 mg/dL — ABNORMAL HIGH (ref 70–99)

## 2019-12-18 MED ORDER — CALCIUM GLUCONATE-NACL 1-0.675 GM/50ML-% IV SOLN
1.0000 g | Freq: Once | INTRAVENOUS | Status: AC
  Administered 2019-12-18: 09:00:00 1000 mg via INTRAVENOUS
  Filled 2019-12-18: qty 50

## 2019-12-18 MED ORDER — IPRATROPIUM BROMIDE 0.02 % IN SOLN: 0.5000 mg | Freq: Four times a day (QID) | RESPIRATORY_TRACT | Status: DC | PRN

## 2019-12-18 MED ORDER — POTASSIUM CHLORIDE CRYS ER 20 MEQ PO TBCR
40.0000 meq | EXTENDED_RELEASE_TABLET | Freq: Two times a day (BID) | ORAL | Status: AC
  Administered 2019-12-18 (×2): 40 meq via ORAL
  Filled 2019-12-18 (×2): qty 2

## 2019-12-18 MED ORDER — DILTIAZEM HCL ER COATED BEADS 120 MG PO CP24
120.0000 mg | ORAL_CAPSULE | Freq: Every day | ORAL | Status: DC
  Administered 2019-12-18 – 2019-12-24 (×7): 120 mg via ORAL
  Filled 2019-12-18 (×7): qty 1

## 2019-12-18 MED ORDER — CALCIUM CARBONATE ANTACID 500 MG PO CHEW
2.0000 | CHEWABLE_TABLET | Freq: Two times a day (BID) | ORAL | Status: AC
  Administered 2019-12-18 (×2): 400 mg via ORAL
  Filled 2019-12-18 (×2): qty 2

## 2019-12-18 NOTE — ED Notes (Signed)
CBG Results of 97 reported to Phill, RN

## 2019-12-18 NOTE — ED Notes (Signed)
Spoke with Marden Noble, pt's son.  Provided updated.

## 2019-12-18 NOTE — ED Notes (Signed)
Pt moved to hospital bed.

## 2019-12-18 NOTE — Progress Notes (Signed)
Critical lab value Calcium=6.3. Dr. Reesa Chew notified via [facility] pager

## 2019-12-18 NOTE — ED Notes (Signed)
Notified physician of critical calcium 6.1

## 2019-12-18 NOTE — Progress Notes (Signed)
Patient ID: Casey Gilmore, male   DOB: [birth year], 79 y.o.   MRN: [id number]       Subjective: Patient still doesn't remember why he is here.  He is oriented to place and time sort of.  He started out with 1970 something and then corrected and just said 21.  Has some pain on left side of his chest.  States he is always SOB even at baseline and this isn't any more so than normal.    ROS: See above, otherwise other systems negative  Objective: Vital signs in last 24 hours: Temp:  [99.1 F (37.3 C)] 99.1 F (37.3 C) (01/11 1015) Pulse Rate:  [60-101] 76 (01/12 0700) Resp:  [20-31] 22 (01/12 0700) BP: (88-167)/(47-99) 112/47 (01/12 0700) SpO2:  [90 %-100 %] 99 % (01/12 0700) Weight:  [73.9 kg] 73.9 kg (01/11 0938)    Intake/Output from previous day: No intake/output data recorded. Intake/Output this shift: No intake/output data recorded.  PE: Gen: NAD, laying in bed Heart: converted to NSR Lungs: much better today, CTAB, some left-sided chest wall soreness as expected Abd: soft, NT, ND, +Bs Ext: MAE, NVI  Lab Results:  Recent Labs    12/17/19 0955 12/18/19 0510  WBC 16.2* 12.7*  HGB 11.1* 9.5*  HCT 37.4* 31.2*  PLT 420* 315   BMET Recent Labs    12/17/19 0955 12/18/19 0510  NA 143 142  K 3.4* 3.0*  CL 100 101  CO2 26 28  GLUCOSE 53* 96  BUN 22 22  CREATININE 1.68* 1.23  CALCIUM 6.5* 6.1*   PT/INR No results for input(s): LABPROT, INR in the last 72 hours. CMP     Component Value Date/Time   NA 142 12/18/2019 0510   K 3.0 (L) 12/18/2019 0510   CL 101 12/18/2019 0510   CO2 28 12/18/2019 0510   GLUCOSE 96 12/18/2019 0510   BUN 22 12/18/2019 0510   CREATININE 1.23 12/18/2019 0510   CALCIUM 6.1 (LL) 12/18/2019 0510   PROT 6.3 (L) 07/26/2018 1154   ALBUMIN 3.0 (L) 07/26/2018 1154   AST 14 (L) 07/26/2018 1154   ALT 11 07/26/2018 1154   ALKPHOS 127 (H) 07/26/2018 1154   BILITOT 0.8 07/26/2018 1154   GFRNONAA 56 (L) 12/18/2019 0510   GFRAA >60  12/18/2019 0510   Lipase     Component Value Date/Time   LIPASE 21 07/26/2018 1154       Studies/Results: CT Head Wo Contrast  Result Date: 12/17/2019 CLINICAL DATA:  Altered mental status since fall yesterday. EXAM: CT HEAD WITHOUT CONTRAST CT CERVICAL SPINE WITHOUT CONTRAST TECHNIQUE: Multidetector CT imaging of the head and cervical spine was performed following the standard protocol without intravenous contrast. Multiplanar CT image reconstructions of the cervical spine were also generated. COMPARISON:  CT head and cervical spine dated January 15, 2018. 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 No evidence of hemorrhage, hydrocephalus, extra-axial collection or mass lesion/mass effect. Stable moderate atrophy and mild chronic microvascular ischemic changes. Vascular: Atherosclerotic vascular calcification of the carotid siphons. No hyperdense vessel. Skull: Normal. Negative for fracture or focal lesion. Sinuses/Orbits: No acute finding. Other: None. CT CERVICAL SPINE FINDINGS Alignment: Normal. Skull base and vertebrae: No acute fracture. No primary bone lesion or focal pathologic process. Soft tissues and spinal canal: No prevertebral fluid or swelling. No visible canal hematoma. Disc levels: Moderate disc height loss and uncovertebral hypertrophy at C5-C6 and C6-C7, unchanged. Upper chest:  Small left apical pneumothorax. Other: None. IMPRESSION: 1. 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 2. No acute cervical spine fracture. Stable moderate degenerative changes at C5-C6 and C6-C7. 3. Small left apical pneumothorax. Electronically Signed   By: Titus Dubin M.D.   On: 12/17/2019 13:09   CT Cervical Spine Wo Contrast  Result Date: 12/17/2019 CLINICAL DATA:  Altered mental status since fall yesterday. EXAM: CT HEAD WITHOUT CONTRAST CT CERVICAL SPINE WITHOUT  CONTRAST TECHNIQUE: Multidetector CT imaging of the head and cervical spine was performed following the standard protocol without intravenous contrast. Multiplanar CT image reconstructions of the cervical spine were also generated. COMPARISON:  CT head and cervical spine dated January 15, 2018. 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 No evidence of hemorrhage, hydrocephalus, extra-axial collection or mass lesion/mass effect. Stable moderate atrophy and mild chronic microvascular ischemic changes. Vascular: Atherosclerotic vascular calcification of the carotid siphons. No hyperdense vessel. Skull: Normal. Negative for fracture or focal lesion. Sinuses/Orbits: No acute finding. Other: None. CT CERVICAL SPINE FINDINGS Alignment: Normal. Skull base and vertebrae: No acute fracture. No primary bone lesion or focal pathologic process. Soft tissues and spinal canal: No prevertebral fluid or swelling. No visible canal hematoma. Disc levels: Moderate disc height loss and uncovertebral hypertrophy at C5-C6 and C6-C7, unchanged. Upper chest: Small left apical pneumothorax. Other: None. IMPRESSION: 1. 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 2. No acute cervical spine fracture. Stable moderate degenerative changes at C5-C6 and C6-C7. 3. Small left apical pneumothorax. Electronically Signed   By: Titus Dubin M.D.   On: 12/17/2019 13:09   DG CHEST PORT 1 VIEW  Result Date: 12/17/2019 CLINICAL DATA:  Acute respiratory failure EXAM: PORTABLE CHEST 1 VIEW COMPARISON:  Earlier same day FINDINGS: Multiple left rib fractures are again identified with adjacent subcutaneous emphysema. Probable small left apical pneumothorax remains unchanged. Patchy left lung density, which may reflect atelectasis/contusion. Stable cardiomediastinal contours with evidence of aortic valve replacement. IMPRESSION:  Probable small left apical pneumothorax remains unchanged. Patchy left lung density probably reflecting atelectasis/contusion. Electronically Signed   By: Macy Mis M.D.   On: 12/17/2019 16:59   DG Chest Portable 1 View  Result Date: 12/17/2019 CLINICAL DATA:  Fall.  Confusion.  Chest pain. EXAM: PORTABLE CHEST 1 VIEW COMPARISON:  09/01/2018 FINDINGS: Previous median sternotomy and aortic valve replacement. Mild cardiac enlargement. Hiatal hernia is noted. The right chest is clear. There are fractures of the right lateral ribs, at least rib numbers 4, 5, 6, 7 and 8. There is a left pneumothorax without tension, estimated at 10%. IMPRESSION: Left-sided rib fractures 4 through 8. Left pneumothorax without tension, estimated at 10%. Previous aortic valve replacement. Hiatal hernia. Electronically Signed   By: Nelson Chimes M.D.   On: 12/17/2019 11:13   DG Hip [place] W or Wo Pelvis 1 View Left  Result Date: 12/17/2019 CLINICAL DATA:  Left hip pain after fall. EXAM: DG HIP (WITH OR WITHOUT PELVIS) 1V PORT LEFT COMPARISON:  Apr 18, 2018. FINDINGS: Status post surgical fusion of the left hip joint. Status post surgical internal fixation of proximal left femoral fracture. Status post right total hip arthroplasty. No definite acute fracture or dislocation is noted. IMPRESSION: Postsurgical changes as described above. No definite acute abnormality is noted. Electronically Signed   By: Marijo Conception M.D.   On: 12/17/2019 13:54  Anti-infectives: Anti-infectives (From admission, onward)   None       Assessment/Plan Hx of NSTEMI 2012 w/ normal coronaries on cath Hx of aortic stenosis s/p AV replacement with tissue valve 2012 HTN HLD COPD Hx of laryngeal cancer GERD T2DM - new hgba1c appears normal H/O multiple falls with multiple bony fractures ARI - resolved, likely secondary to dehydration Afib - convert to NSR, per cardiology  Ground level fall L ribs 4-8 with small left PTX - CXR  still pending but looks stable with no increase in PTX and no needs for a chest tube due to small size.  Given stability, likely no need for further CXR in am, will see final read from today - pulmonary toilet, IS, flutter valve, nebulizer, multimodal pain control -PT/OT eval for mobilization -cont to follow FEN - heart healthy diet, colace, replace calcium and potassium VTE - prophylactic lovenox ok from our standpoint ID - none needed at this time   LOS: 1 day    Henreitta Cea , [facility] 12/18/2019, 7:24 AM Please see Amion for pager number during day hours 7:00am-4:30pm or 7:00am -11:30am on weekends

## 2019-12-18 NOTE — Evaluation (Signed)
Physical Therapy Evaluation Patient Details Name: Casey Gilmore MRN: [id number] DOB: 11-24-1941 Today's Date: 12/18/2019   History of Present Illness  Casey Gilmore is a 79 y.o. male with medical history significant of UGI bleeding; s/p bioprosthetic AVR; afib; CAD; HTN; HLD; DM; COPD; and vocal cord CA presenting with fall.  He was found to have hypoglycemia with multiple rib fractures and a small pneumothorax.  Clinical Impression  Patient presents with mobility significantly limited due to prior pain L LE and now with L rib pain.  Reports he can get around at home, but feel he likely will need STSNF level rehab as not even able to tolerate getting up to EOB today even after medication given by RN.  Patient feels he can manage at home due to set up there.  States his ribs broke due to son assisting him up not from the fall.  PT to follow acutely.     Follow Up Recommendations SNF;Supervision/Assistance - 24 hour    Equipment Recommendations  None recommended by PT    Recommendations for Other Services       Precautions / Restrictions Precautions Precautions: Fall Precaution Comments: L leg length discrepancy      Mobility  Bed Mobility Overal bed mobility: Needs Assistance Bed Mobility: Supine to Sit     Supine to sit: HOB elevated;Min assist     General bed mobility comments: attempted to elevate HOB and assist to move legs to EOB, but pt did not let me help due to L LE pain and then did not sit up due to pain and lunch tray arriving, assisted to scoot up in bed for positioning with mod A (pt pushed with R leg and pulled with R hand on rail); positioned with HOB elevated for lunch  Transfers                 General transfer comment: NT  Ambulation/Gait             General Gait Details: NT  Stairs            Wheelchair Mobility    Modified Rankin (Stroke Patients Only)       Balance       Sitting balance - Comments: NT                                      Pertinent Vitals/Pain Pain Assessment: Faces Faces Pain Scale: Hurts whole lot Pain Location: L LE with movement and L ribs Pain Descriptors / Indicators: Grimacing;Moaning;Guarding Pain Intervention(s): Monitored during session;Limited activity within patient's tolerance;Premedicated before session;Repositioned    Home Living Family/patient expects to be discharged to:: Private residence Living Arrangements: Alone Available Help at Discharge: Family;Available PRN/intermittently Type of Home: Mobile home Home Access: Level entry   Entrance Stairs-Number of Steps: reports not steps from the deck Home Layout: One level Home Equipment: [place] - 2 wheels;Cane - single point Additional Comments: reports falling about 2 times in past 6 months, states son assisted him up which is how his ribs broke.    Prior Function Level of Independence: Needs assistance   Gait / Transfers Assistance Needed: uses walker and built up shoe to get around, unclear how he manages his home           Hand Dominance        Extremity/Trunk Assessment   Upper Extremity Assessment Upper Extremity Assessment: LUE  deficits/detail LUE Deficits / Details: AAROM to about 50 when pt reported pain and unable to continue, elbow flexion strength 4-/5, extension 2+/5 with pain    Lower Extremity Assessment Lower Extremity Assessment: LLE deficits/detail LLE Deficits / Details: Would not allow AAROM for knee flexion, AROM only about to 30 degrees with pt reporting some prior hip and knee limitations due to fractures 10-12 years ago, reports 3.5 inch leg length discrepancy for which has built up shoe which is at home LLE: Unable to fully assess due to pain       Communication   Communication: HOH(high pitched soft voice from prior to laryngeal cancer)  Cognition Arousal/Alertness: Awake/alert Behavior During Therapy: Anxious Overall Cognitive Status: No family/caregiver  present to determine baseline cognitive functioning                                 General Comments: oriented to situation, but slow to recall information about home set up, how he functions at home,etc, h/o dementia      General Comments General comments (skin integrity, edema, etc.): laceration over R eye with closure strips and R arm with some drainage onto blanket under arm    Exercises     Assessment/Plan    PT Assessment Patient needs continued PT services  PT Problem List Decreased strength;Decreased activity tolerance;Decreased mobility;Decreased balance;Decreased knowledge of use of DME;Pain;Decreased safety awareness       PT Treatment Interventions DME instruction;Therapeutic activities;Balance training;Functional mobility training;Gait training;Therapeutic exercise;Patient/family education    PT Goals (Current goals can be found in the Care Plan section)  Acute Rehab PT Goals Patient Stated Goal: to go home PT Goal Formulation: Patient unable to participate in goal setting Time For Goal Achievement: 01/01/20 Potential to Achieve Goals: Fair    Frequency Min 3X/week   Barriers to discharge        Co-evaluation               AM-PAC PT "6 Clicks" Mobility  Outcome Measure Help needed turning from your back to your side while in a flat bed without using bedrails?: Total Help needed moving from lying on your back to sitting on the side of a flat bed without using bedrails?: Total Help needed moving to and from a bed to a chair (including a wheelchair)?: Total Help needed standing up from a chair using your arms (e.g., wheelchair or bedside chair)?: Total Help needed to walk in hospital room?: Total Help needed climbing 3-5 steps with a railing? : Total 6 Click Score: 6    End of Session Equipment Utilized During Treatment: Oxygen Activity Tolerance: Patient limited by pain Patient left: in bed;with call bell/phone within reach   PT Visit  Diagnosis: Other abnormalities of gait and mobility (R26.89);Pain Pain - Right/Left: Left Pain - part of body: Knee(and L ribs)    Time: 1150-1210(& [id number]) PT Time Calculation (min) (ACUTE ONLY): 20 min   Charges:   PT Evaluation $PT Eval High Complexity: 1 High PT Treatments $Therapeutic Activity: 8-22 mins        Magda Kiel, [place] [facility] [phone] 12/18/2019   Reginia Naas 12/18/2019, 2:06 PM

## 2019-12-18 NOTE — ED Notes (Signed)
Breakfast Ordered 

## 2019-12-18 NOTE — Progress Notes (Addendum)
PROGRESS NOTE    Casey Gilmore  [id number] DOB: Jul 28, 1941 DOA: 12/17/2019 PCP: Mayra Neer, MD   Brief Narrative:  Casey Gilmore is a 79 y.o. male with medical history significant of UGI bleeding; s/p bioprosthetic AVR; afib; CAD; HTN; HLD; DM; COPD; and vocal cord CA presenting with fall.  He was found to have hypoglycemia with multiple rib fractures and a small pneumothorax.  Surgery is following and recommending conservative management.  Subjective: Patient was feeling little better when seen this morning.  He was complaining of some lower chest and epigastric pain.  Denies any shortness of breath.  He was able to answer questions appropriately.  Assessment & Plan:   Principal Problem:   Fall Active Problems:   Diabetes mellitus ([place])   Chronic diastolic heart failure (HCC)   Benign essential HTN   COPD (chronic obstructive pulmonary disease) with chronic bronchitis (HCC)   Chronic pain syndrome   Multiple closed fractures of ribs of left side   Pneumothorax, closed, traumatic, initial encounter   Atrial fibrillation with RVR ([place])  Fall with rib fractures and PTX Patient with fall at home on 12/16/2019, suffering from multiple rib fractures and traumatic PTX. Chest x-ray with stable pneumothorax.  Feeling little better today. Surgery is recommending conservative management. -Continue with incentive spirometry and flutter valve. -Continue with pain management. -PT/OT evaluation for falls.  Acute on chronic hypoxic respiratory failure Patient has underlying COPD but is not on home O2.  Patient was saturating well on room air this morning.  Fib with RVR.  Initially managed with Cardizem infusion.  Now back in sinus rhythm.  TSH within normal limit.  Most likely triggered with injury. Cardiology is following-appreciate the recommendations. -IV Cardizem with p.o. -Not a candidate for anticoagulation because of falls and history of GI bleed. -Echocardiogram  pending.  Diastolic CHF.  Does not appear volume overload.  History of bioprosthetic valve. -Cardiology ordered repeat echo-pending  Hypocalcemia/hypokalemia.  Corrected calcium of 7.3 and potassium of 3. -Replete electrolytes and monitor. -Check parathyroid and phosphate levels in the morning.  Diabetes.  Initially hypoglycemic, now CBG within goal. A1c 5.8-high risk for hypoglycemia. -Continue to monitor. -We will restart home diabetic medications once indicated.  Chronic pain.  On Vicodin and Robaxin at home. [place] controlled substance reporting system was appropriate. -Being managed with oxycodone and Robaxin currently.  Objective: Vitals:   12/18/19 1130 12/18/19 1145 12/18/19 1200 12/18/19 1215  BP: 122/72 120/71 117/69 114/70  Pulse: 70 74 73 70  Resp: (!) 23 (!) 24 (!) 25 (!) 26  Temp:      TempSrc:      SpO2: 100% 93% 93% 92%  Weight:       No intake or output data in the 24 hours ending 12/18/19 1217 Filed Weights   12/17/19 0938  Weight: 73.9 kg    Examination:  General exam: Appears calm and comfortable  Respiratory system: Clear to auscultation. Respiratory effort normal. Cardiovascular system: S1 & S2 heard, RRR. No JVD, murmurs, rubs, gallops or clicks. Gastrointestinal system: Soft, nontender, nondistended, bowel sounds positive. Central nervous system: Alert and oriented. No focal neurological deficits. Extremities: No edema, no cyanosis, pulses intact and symmetrical. Skin: Multiple ecchymosis and a bandage over her right eye. Psychiatry: Judgement and insight appear normal. Mood & affect appropriate.    DVT prophylaxis: SCDs Code Status: DNR Family Communication: No family at bedside. Disposition Plan: Most likely be SNF-pending improvement in PT/OT evaluation. Not a safe discharge to home.  Consultants:   Surgery  Cardiology  Procedures:  Antimicrobials:   Data Reviewed: I have personally reviewed following labs and imaging  studies  CBC: Recent Labs  Lab 12/17/19 0955 12/18/19 0510  WBC 16.2* 12.7*  NEUTROABS 14.6*  --   HGB 11.1* 9.5*  HCT 37.4* 31.2*  MCV 77.1* 75.4*  PLT 420* [id number]   Basic Metabolic Panel: Recent Labs  Lab 12/17/19 0955 12/18/19 0510  NA 143 142  K 3.4* 3.0*  CL 100 101  CO2 26 28  GLUCOSE 53* 96  BUN 22 22  CREATININE 1.68* 1.23  CALCIUM 6.5* 6.1*   GFR: CrCl cannot be calculated (Unknown ideal weight.). Liver Function Tests: Recent Labs  Lab 12/18/19 0510  AST 16  ALT 8  ALKPHOS 71  BILITOT 1.1  PROT 5.3*  ALBUMIN 2.5*   No results for input(s): LIPASE, AMYLASE in the last 168 hours. No results for input(s): AMMONIA in the last 168 hours. Coagulation Profile: No results for input(s): INR, PROTIME in the last 168 hours. Cardiac Enzymes: No results for input(s): CKTOTAL, CKMB, CKMBINDEX, TROPONINI in the last 168 hours. BNP (last 3 results) No results for input(s): PROBNP in the last 8760 hours. HbA1C: Recent Labs    12/17/19 2000  HGBA1C 5.8*   CBG: Recent Labs  Lab 12/17/19 2002 12/17/19 2341 12/18/19 0301 12/18/19 0720 12/18/19 1023  GLUCAP 64* 112* 93 103* 113*   Lipid Profile: No results for input(s): CHOL, HDL, LDLCALC, TRIG, CHOLHDL, LDLDIRECT in the last 72 hours. Thyroid Function Tests: Recent Labs    12/17/19 2000  TSH 2.092   Anemia Panel: No results for input(s): VITAMINB12, FOLATE, FERRITIN, TIBC, IRON, RETICCTPCT in the last 72 hours. Sepsis Labs: No results for input(s): PROCALCITON, LATICACIDVEN in the last 168 hours.  Recent Results (from the past 240 hour(s))  Urine culture     Status: Abnormal   Collection Time: 12/17/19  9:55 AM   Specimen: Urine, Random  Result Value Ref Range Status   Specimen Description URINE, RANDOM  Final   Special Requests NONE  Final   Culture (A)  Final    <10,000 COLONIES/mL INSIGNIFICANT GROWTH Performed at [facility], [address] [facility], [place] [address]    Report  Status 12/18/2019 FINAL  Final  SARS CORONAVIRUS 2 (TAT 6-24 HRS) Nasopharyngeal Nasopharyngeal Swab     Status: None   Collection Time: 12/17/19  2:30 PM   Specimen: Nasopharyngeal Swab  Result Value Ref Range Status   SARS Coronavirus 2 NEGATIVE NEGATIVE Final    Comment: (NOTE) SARS-CoV-2 target nucleic acids are NOT DETECTED. The SARS-CoV-2 RNA is generally detectable in upper and lower respiratory specimens during the acute phase of infection. Negative results do not preclude SARS-CoV-2 infection, do not rule out co-infections with other pathogens, and should not be used as the sole basis for treatment or other patient management decisions. Negative results must be combined with clinical observations, patient history, and epidemiological information. The expected result is Negative. Fact Sheet for Patients: SugarRoll.be Fact Sheet for Healthcare Providers: https://www.woods-mathews.com/ This test is not yet approved or cleared by the [place] FDA and  has been authorized for detection and/or diagnosis of SARS-CoV-2 by FDA under an Emergency Use Authorization (EUA). This EUA will remain  in effect (meaning this test can be used) for the duration of the COVID-19 declaration under Section 56 4(b)(1) of the Act, 21 U.S.C. section 360bbb-3(b)(1), unless the authorization is terminated or revoked sooner. Performed at [facility]  [facility], [place] [address], [place], [place] [address]      Radiology Studies: CT Head Wo Contrast  Result Date: 12/17/2019 CLINICAL DATA:  Altered mental status since fall yesterday. EXAM: CT HEAD WITHOUT CONTRAST CT CERVICAL SPINE WITHOUT CONTRAST TECHNIQUE: Multidetector CT imaging of the head and cervical spine was performed following the standard protocol without intravenous contrast. Multiplanar CT image reconstructions of the cervical spine were also generated. COMPARISON:  CT head and cervical spine dated  January 15, 2018. 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 No evidence of hemorrhage, hydrocephalus, extra-axial collection or mass lesion/mass effect. Stable moderate atrophy and mild chronic microvascular ischemic changes. Vascular: Atherosclerotic vascular calcification of the carotid siphons. No hyperdense vessel. Skull: Normal. Negative for fracture or focal lesion. Sinuses/Orbits: No acute finding. Other: None. CT CERVICAL SPINE FINDINGS Alignment: Normal. Skull base and vertebrae: No acute fracture. No primary bone lesion or focal pathologic process. Soft tissues and spinal canal: No prevertebral fluid or swelling. No visible canal hematoma. Disc levels: Moderate disc height loss and uncovertebral hypertrophy at C5-C6 and C6-C7, unchanged. Upper chest: Small left apical pneumothorax. Other: None. IMPRESSION: 1. 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 2. No acute cervical spine fracture. Stable moderate degenerative changes at C5-C6 and C6-C7. 3. Small left apical pneumothorax. Electronically Signed   By: Titus Dubin M.D.   On: 12/17/2019 13:09   CT Cervical Spine Wo Contrast  Result Date: 12/17/2019 CLINICAL DATA:  Altered mental status since fall yesterday. EXAM: CT HEAD WITHOUT CONTRAST CT CERVICAL SPINE WITHOUT CONTRAST TECHNIQUE: Multidetector CT imaging of the head and cervical spine was performed following the standard protocol without intravenous contrast. Multiplanar CT image reconstructions of the cervical spine were also generated. COMPARISON:  CT head and cervical spine dated January 15, 2018. 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 No evidence of hemorrhage, hydrocephalus, extra-axial collection or mass lesion/mass effect. Stable moderate atrophy and mild chronic  microvascular ischemic changes. Vascular: Atherosclerotic vascular calcification of the carotid siphons. No hyperdense vessel. Skull: Normal. Negative for fracture or focal lesion. Sinuses/Orbits: No acute finding. Other: None. CT CERVICAL SPINE FINDINGS Alignment: Normal. Skull base and vertebrae: No acute fracture. No primary bone lesion or focal pathologic process. Soft tissues and spinal canal: No prevertebral fluid or swelling. No visible canal hematoma. Disc levels: Moderate disc height loss and uncovertebral hypertrophy at C5-C6 and C6-C7, unchanged. Upper chest: Small left apical pneumothorax. Other: None. IMPRESSION: 1. 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 2. No acute cervical spine fracture. Stable moderate degenerative changes at C5-C6 and C6-C7. 3. Small left apical pneumothorax. Electronically Signed   By: Titus Dubin M.D.   On: 12/17/2019 13:09   DG CHEST PORT 1 VIEW  Result Date: 12/18/2019 CLINICAL DATA:  Pain following fall EXAM: PORTABLE CHEST 1 VIEW COMPARISON:  December 17, 2019 FINDINGS: Multiple displaced rib fractures on the left are again noted with small left apical pneumothorax, stable. No tension component. There is subcutaneous air. There is persistent left lower lobe consolidation with small left pleural effusion. Right lung is clear. Heart size is upper normal with pulmonary vascularity normal. There is aortic atherosclerosis. Patient is status post aortic valve replacement. No adenopathy. Bones are osteoporotic. Postoperative change noted in left proximal humerus. IMPRESSION: Displaced rib fractures on the left  appear grossly stable. Small left apical pneumothorax is stable as is subcutaneous air on the left. There remains left lower lobe consolidation with small left pleural effusion. Right lung is clear. Cardiac silhouette is stable with postoperative changes secondary to aortic valve replacement. Aortic Atherosclerosis  (ICD10-I70.0). Appearance similar compared to 1 day prior. Electronically Signed   By: Lowella Grip III M.D.   On: 12/18/2019 08:02   DG CHEST PORT 1 VIEW  Result Date: 12/17/2019 CLINICAL DATA:  Acute respiratory failure EXAM: PORTABLE CHEST 1 VIEW COMPARISON:  Earlier same day FINDINGS: Multiple left rib fractures are again identified with adjacent subcutaneous emphysema. Probable small left apical pneumothorax remains unchanged. Patchy left lung density, which may reflect atelectasis/contusion. Stable cardiomediastinal contours with evidence of aortic valve replacement. IMPRESSION: Probable small left apical pneumothorax remains unchanged. Patchy left lung density probably reflecting atelectasis/contusion. Electronically Signed   By: Macy Mis M.D.   On: 12/17/2019 16:59   DG Chest Portable 1 View  Result Date: 12/17/2019 CLINICAL DATA:  Fall.  Confusion.  Chest pain. EXAM: PORTABLE CHEST 1 VIEW COMPARISON:  09/01/2018 FINDINGS: Previous median sternotomy and aortic valve replacement. Mild cardiac enlargement. Hiatal hernia is noted. The right chest is clear. There are fractures of the right lateral ribs, at least rib numbers 4, 5, 6, 7 and 8. There is a left pneumothorax without tension, estimated at 10%. IMPRESSION: Left-sided rib fractures 4 through 8. Left pneumothorax without tension, estimated at 10%. Previous aortic valve replacement. Hiatal hernia. Electronically Signed   By: Nelson Chimes M.D.   On: 12/17/2019 11:13   DG Hip [place] W or Wo Pelvis 1 View Left  Result Date: 12/17/2019 CLINICAL DATA:  Left hip pain after fall. EXAM: DG HIP (WITH OR WITHOUT PELVIS) 1V PORT LEFT COMPARISON:  Apr 18, 2018. FINDINGS: Status post surgical fusion of the left hip joint. Status post surgical internal fixation of proximal left femoral fracture. Status post right total hip arthroplasty. No definite acute fracture or dislocation is noted. IMPRESSION: Postsurgical changes as described above.  No definite acute abnormality is noted. Electronically Signed   By: Marijo Conception M.D.   On: 12/17/2019 13:54    Scheduled Meds: . acetaminophen  650 mg Oral Q6H  . calcium carbonate  2 tablet Oral BID WC  . diltiazem  120 mg Oral Daily  . docusate sodium  100 mg Oral BID  . insulin aspart  0-15 Units Subcutaneous TID WC  . ipratropium  0.5 mg Nebulization Q6H  . lidocaine-EPINEPHrine  10 mL Intradermal Once  . methocarbamol  500 mg Oral TID  . pantoprazole  40 mg Oral Daily  . potassium chloride  40 mEq Oral BID  . sodium chloride flush  3 mL Intravenous Q12H   Continuous Infusions:   LOS: 1 day   Time spent: 35 minutes  Lorella Nimrod, MD [facility] Pager [phone]  If 7PM-7AM, please contact night-coverage [email or web address] Password [facility] 12/18/2019, 12:17 PM   This record has been created using Systems analyst. Errors have been sought and corrected,but may not always be located. Such creation errors do not reflect on the standard of care.

## 2019-12-18 NOTE — ED Notes (Signed)
Lunch Tray Ordered @ 1114.  

## 2019-12-18 NOTE — Progress Notes (Signed)
Progress Note  Patient Name: Casey Gilmore Date of Encounter: 12/18/2019  Primary Cardiologist: No primary care provider on file.   Subjective   Patient still has some chest wall pain. Dyspnea is at baseline.   Inpatient Medications    Scheduled Meds: . acetaminophen  650 mg Oral Q6H  . calcium carbonate  2 tablet Oral BID WC  . diltiazem  120 mg Oral Daily  . docusate sodium  100 mg Oral BID  . insulin aspart  0-15 Units Subcutaneous TID WC  . ipratropium  0.5 mg Nebulization Q6H  . lidocaine-EPINEPHrine  10 mL Intradermal Once  . methocarbamol  500 mg Oral TID  . pantoprazole  40 mg Oral Daily  . potassium chloride  40 mEq Oral BID  . sodium chloride flush  3 mL Intravenous Q12H   Continuous Infusions: . calcium gluconate 1,000 mg (12/18/19 0846)   PRN Meds: acetaminophen **OR** acetaminophen, morphine injection, ondansetron **OR** ondansetron (ZOFRAN) IV, oxyCODONE   Vital Signs    Vitals:   12/18/19 0745 12/18/19 0800 12/18/19 0815 12/18/19 0830  BP: 122/66 115/69 116/68 123/62  Pulse: 74 73 72 77  Resp: (!) 25 (!) 24 19 (!) 22  Temp:      TempSrc:      SpO2: 99% 100% 100% 100%  Weight:       No intake or output data in the 24 hours ending 12/18/19 0905 Last 3 Weights 12/17/2019 07/26/2018 04/24/2018  Weight (lbs) 162 lb 14.7 oz 162 lb 7.7 oz 173 lb 8 oz  Weight (kg) 73.9 kg 73.7 kg 78.7 kg      Telemetry    Converted to NSR with PACs - Personally Reviewed  ECG    pending - Personally Reviewed  Physical Exam   GEN: Elderly WM No acute distress.   Neck: No JVD Cardiac: RRR, no murmurs, rubs, or gallops.  Respiratory: Coarse BS bilaterally GI: Soft, nontender, non-distended  MS: No edema; No deformity. Neuro:  Nonfocal, oriented to [facility] hospital but thinks he is in [place]. Doesn't know why he is here.   Psych: Normal affect   Labs    High Sensitivity Troponin:   Recent Labs  Lab 12/17/19 0955 12/17/19 1420  TROPONINIHS 145* 128*      Chemistry Recent Labs  Lab 12/17/19 0955 12/18/19 0510  NA 143 142  K 3.4* 3.0*  CL 100 101  CO2 26 28  GLUCOSE 53* 96  BUN 22 22  CREATININE 1.68* 1.23  CALCIUM 6.5* 6.1*  GFRNONAA 38* 56*  GFRAA 44* >60  ANIONGAP 17* 13     Hematology Recent Labs  Lab 12/17/19 0955 12/18/19 0510  WBC 16.2* 12.7*  RBC 4.85 4.14*  HGB 11.1* 9.5*  HCT 37.4* 31.2*  MCV 77.1* 75.4*  MCH 22.9* 22.9*  MCHC 29.7* 30.4  RDW 19.3* 19.0*  PLT 420* 315    BNPNo results for input(s): BNP, PROBNP in the last 168 hours.   DDimer No results for input(s): DDIMER in the last 168 hours.   Radiology    CT Head Wo Contrast  Result Date: 12/17/2019 CLINICAL DATA:  Altered mental status since fall yesterday. EXAM: CT HEAD WITHOUT CONTRAST CT CERVICAL SPINE WITHOUT CONTRAST TECHNIQUE: Multidetector CT imaging of the head and cervical spine was performed following the standard protocol without intravenous contrast. Multiplanar CT image reconstructions of the cervical spine were also generated. COMPARISON:  CT head and cervical spine dated January 15, 2018. 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 No evidence of hemorrhage, hydrocephalus, extra-axial collection or mass lesion/mass effect. Stable moderate atrophy and mild chronic microvascular ischemic changes. Vascular: Atherosclerotic vascular calcification of the carotid siphons. No hyperdense vessel. Skull: Normal. Negative for fracture or focal lesion. Sinuses/Orbits: No acute finding. Other: None. CT CERVICAL SPINE FINDINGS Alignment: Normal. Skull base and vertebrae: No acute fracture. No primary bone lesion or focal pathologic process. Soft tissues and spinal canal: No prevertebral fluid or swelling. No visible canal hematoma. Disc levels: Moderate disc height loss and uncovertebral hypertrophy at C5-C6 and C6-C7, unchanged. Upper chest: Small left apical pneumothorax.  Other: None. IMPRESSION: 1. 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 2. No acute cervical spine fracture. Stable moderate degenerative changes at C5-C6 and C6-C7. 3. Small left apical pneumothorax. Electronically Signed   By: Titus Dubin M.D.   On: 12/17/2019 13:09   CT Cervical Spine Wo Contrast  Result Date: 12/17/2019 CLINICAL DATA:  Altered mental status since fall yesterday. EXAM: CT HEAD WITHOUT CONTRAST CT CERVICAL SPINE WITHOUT CONTRAST TECHNIQUE: Multidetector CT imaging of the head and cervical spine was performed following the standard protocol without intravenous contrast. Multiplanar CT image reconstructions of the cervical spine were also generated. COMPARISON:  CT head and cervical spine dated January 15, 2018. 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 No evidence of hemorrhage, hydrocephalus, extra-axial collection or mass lesion/mass effect. Stable moderate atrophy and mild chronic microvascular ischemic changes. Vascular: Atherosclerotic vascular calcification of the carotid siphons. No hyperdense vessel. Skull: Normal. Negative for fracture or focal lesion. Sinuses/Orbits: No acute finding. Other: None. CT CERVICAL SPINE FINDINGS Alignment: Normal. Skull base and vertebrae: No acute fracture. No primary bone lesion or focal pathologic process. Soft tissues and spinal canal: No prevertebral fluid or swelling. No visible canal hematoma. Disc levels: Moderate disc height loss and uncovertebral hypertrophy at C5-C6 and C6-C7, unchanged. Upper chest: Small left apical pneumothorax. Other: None. IMPRESSION: 1. 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 2. No acute cervical spine fracture. Stable moderate degenerative changes at C5-C6 and C6-C7. 3. Small left apical pneumothorax. Electronically Signed   By: Titus Dubin M.D.   On: 12/17/2019 13:09   DG CHEST PORT 1 VIEW  Result Date: 12/18/2019 CLINICAL DATA:  Pain following fall EXAM: PORTABLE CHEST 1 VIEW COMPARISON:  December 17, 2019 FINDINGS: Multiple displaced rib fractures on the left are again noted with small left apical pneumothorax, stable. No tension component. There is subcutaneous air. There is persistent left lower lobe consolidation with small left pleural effusion. Right lung is clear. Heart size is upper normal with pulmonary vascularity normal. There is aortic atherosclerosis. Patient is status post aortic valve replacement. No adenopathy. Bones are osteoporotic. Postoperative change noted in left proximal humerus. IMPRESSION: Displaced rib fractures on the left appear grossly stable. Small left apical pneumothorax is stable as is subcutaneous air on the left. There remains left lower lobe consolidation with small left pleural effusion. Right lung is clear. Cardiac silhouette is stable with postoperative changes secondary to aortic valve replacement. Aortic Atherosclerosis (ICD10-I70.0). Appearance similar compared to 1 day prior. Electronically Signed   By: Lowella Grip III M.D.   On: 12/18/2019 08:02   DG CHEST PORT 1 VIEW  Result Date: 12/17/2019 CLINICAL DATA:  Acute respiratory failure EXAM: PORTABLE CHEST 1 VIEW COMPARISON:  Earlier same day FINDINGS: Multiple left rib fractures are again identified with adjacent subcutaneous emphysema. Probable small left apical pneumothorax remains unchanged. Patchy left lung density, which may reflect atelectasis/contusion. Stable cardiomediastinal contours with evidence of aortic valve replacement. IMPRESSION: Probable small left apical pneumothorax remains unchanged. Patchy left lung density probably reflecting atelectasis/contusion. Electronically Signed   By: Macy Mis M.D.   On: 12/17/2019 16:59   DG Chest Portable 1 View  Result Date: 12/17/2019 CLINICAL DATA:  Fall.  Confusion.  Chest  pain. EXAM: PORTABLE CHEST 1 VIEW COMPARISON:  09/01/2018 FINDINGS: Previous median sternotomy and aortic valve replacement. Mild cardiac enlargement. Hiatal hernia is noted. The right chest is clear. There are fractures of the right lateral ribs, at least rib numbers 4, 5, 6, 7 and 8. There is a left pneumothorax without tension, estimated at 10%. IMPRESSION: Left-sided rib fractures 4 through 8. Left pneumothorax without tension, estimated at 10%. Previous aortic valve replacement. Hiatal hernia. Electronically Signed   By: Nelson Chimes M.D.   On: 12/17/2019 11:13   DG Hip [place] W or Wo Pelvis 1 View Left  Result Date: 12/17/2019 CLINICAL DATA:  Left hip pain after fall. EXAM: DG HIP (WITH OR WITHOUT PELVIS) 1V PORT LEFT COMPARISON:  Apr 18, 2018. FINDINGS: Status post surgical fusion of the left hip joint. Status post surgical internal fixation of proximal left femoral fracture. Status post right total hip arthroplasty. No definite acute fracture or dislocation is noted. IMPRESSION: Postsurgical changes as described above. No definite acute abnormality is noted. Electronically Signed   By: Marijo Conception M.D.   On: 12/17/2019 13:54    Cardiac Studies   Echo pending  Patient Profile     79 y.o. 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 Lorin Mercy.   Assessment & Plan    1. Atrial fibrillation with RVR. Now converted to NSR. Probably triggered by fall with multiple rib fractures and small pneumothorax. TSH normal. Marked hypokalemia and hypocalcemia noted. Will repeat Ecg today. Check Echo. Switch IV cardizem to po. 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 2. Fall with multiple rib fractures and small pneumothorax 3. AS s/p bioprosthetic AVR 4. DM hypoglycemic on arrival. 5. Hypocalcemia. Management per primary team 6. Hypokalemia-  replete 7. COPD.  8. Altered mental status. Suspect some baseline dementia exacerbated by metabolic derangements and recent fall.     For questions or updates, please contact [place] Please consult [email or web address] for contact info under        Signed, Meggen Spaziani [place], MD  12/18/2019, 9:05 AM

## 2019-12-18 NOTE — Progress Notes (Signed)
  Echocardiogram 2D Echocardiogram has been performed.  Casey Gilmore 12/18/2019, 8:58 AM

## 2019-12-19 ENCOUNTER — Inpatient Hospital Stay (HOSPITAL_COMMUNITY): Payer: Medicare Other

## 2019-12-19 LAB — GLUCOSE, CAPILLARY
Glucose-Capillary: 149 mg/dL — ABNORMAL HIGH (ref 70–99)
Glucose-Capillary: 146 mg/dL — ABNORMAL HIGH (ref 70–99)
Glucose-Capillary: 154 mg/dL — ABNORMAL HIGH (ref 70–99)
Glucose-Capillary: 178 mg/dL — ABNORMAL HIGH (ref 70–99)
Glucose-Capillary: 190 mg/dL — ABNORMAL HIGH (ref 70–99)
Glucose-Capillary: 157 mg/dL — ABNORMAL HIGH (ref 70–99)

## 2019-12-19 LAB — RENAL FUNCTION PANEL
Albumin: 2.4 g/dL — ABNORMAL LOW (ref 3.5–5.0)
Anion gap: 12 (ref 5–15)
BUN: 26 mg/dL — ABNORMAL HIGH (ref 8–23)
CO2: 28 mmol/L (ref 22–32)
Calcium: 6.4 mg/dL — CL (ref 8.9–10.3)
Chloride: 103 mmol/L (ref 98–111)
Creatinine, Ser: 1.22 mg/dL (ref 0.61–1.24)
GFR calc Af Amer: >60 mL/min (ref >60)
GFR calc non Af Amer: 56 mL/min — ABNORMAL LOW (ref >60)
Glucose, Bld: 167 mg/dL — ABNORMAL HIGH (ref 70–99)
Phosphorus: 3.1 mg/dL (ref 2.5–4.6)
Potassium: 4.3 mmol/L (ref 3.5–5.1)
Sodium: 143 mmol/L (ref 135–145)

## 2019-12-19 MED ORDER — LIDOCAINE 5 % EX PTCH
1.0000 | MEDICATED_PATCH | CUTANEOUS | Status: DC
  Administered 2019-12-19 – 2019-12-24 (×6): 1 via TRANSDERMAL
  Filled 2019-12-19 (×6): qty 1

## 2019-12-19 MED ORDER — CALCIUM GLUCONATE-NACL 1-0.675 GM/50ML-% IV SOLN
1.0000 g | Freq: Once | INTRAVENOUS | Status: AC
  Administered 2019-12-19: 09:00:00 1000 mg via INTRAVENOUS
  Filled 2019-12-19: qty 50

## 2019-12-19 MED ORDER — CALCIUM CARBONATE 1250 (500 CA) MG PO TABS
1.0000 | ORAL_TABLET | Freq: Two times a day (BID) | ORAL | Status: DC
  Administered 2019-12-19 – 2019-12-24 (×11): 500 mg via ORAL
  Filled 2019-12-19 (×11): qty 1

## 2019-12-19 NOTE — Progress Notes (Signed)
Patient have 8 beats wide QRS VT pt. Asymptomatic, VSS. Card PA notified no new order given will continue to monitor patient.

## 2019-12-19 NOTE — Evaluation (Signed)
Occupational Therapy Evaluation Patient Details Name: Casey Gilmore MRN: [id number] DOB: 1941-08-20 Today's Date: 12/19/2019    History of Present Illness Casey Gilmore is a 79 y.o. male with medical history significant of UGI bleeding; s/p bioprosthetic AVR; afib; CAD; HTN; HLD; DM; COPD; and vocal cord CA presenting with fall.  He was found to have hypoglycemia with multiple rib fractures and a small pneumothorax.   Clinical Impression   PT admitted with hypogylcemia and Rib fx with small PTX. Pt currently with functional limitiations due to the deficits listed below (see OT problem list). Pt noted on arrival to rattle sound to breathing and soft voice quality. Pt very limited by LLE knee pain. Pt very guarded. No scans of knee noted in chart review. MD please address L knee pain. Pt sitting eob and provided flutter valve in room to have patient cough and pt able to produce keep coughs. Pt total +2 max (A) nearly total (A) due to LLE pain.  Pt will benefit from skilled OT to increase their independence and safety with adls and balance to allow discharge SNF pending progress.     Follow Up Recommendations  SNF    Equipment Recommendations  3 in 1 bedside commode;Hospital bed    Recommendations for Other Services       Precautions / Restrictions Precautions Precautions: Fall Precaution Comments: L leg length discrepancy      Mobility Bed Mobility Overal bed mobility: Needs Assistance Bed Mobility: Supine to Sit;Sit to Supine     Supine to sit: HOB elevated;+2 for physical assistance;Max assist Sit to supine: +2 for physical assistance;Max assist   General bed mobility comments: pt keeping R LE hooked under LLE throughout all transfer. pt needs extended time to work toward EOB. pt requires pad and helicopter to EOB with therapist maintaining LLE. pt eob unable to demonstrate knee flexion and reports pain with ankle pumps.   Transfers                       Balance                                           ADL either performed or assessed with clinical judgement   ADL Overall ADL's : Needs assistance/impaired Eating/Feeding: Set up;Sitting   Grooming: Wash/dry face;Set up;Bed level   Upper Body Bathing: Maximal assistance   Lower Body Bathing: Total assistance   Upper Body Dressing : Maximal assistance   Lower Body Dressing: Total assistance                 General ADL Comments: only able to progress patient ot EOB due to LLE so extreme pain and unable to tolerate any more     Vision         Perception     Praxis      Pertinent Vitals/Pain Pain Assessment: Faces Faces Pain Scale: Hurts whole lot Pain Location: L LE with movement and L ribs Pain Descriptors / Indicators: Grimacing;Moaning;Guarding Pain Intervention(s): Monitored during session;Repositioned(RN notified of extreme pain in LLE)     Hand Dominance Right   Extremity/Trunk Assessment Upper Extremity Assessment Upper Extremity Assessment: Overall WFL for tasks assessed   Lower Extremity Assessment Lower Extremity Assessment: LLE deficits/detail LLE Deficits / Details: held in full extension, extreme pain , using R LE to maintain stability of LLE throughout  session,  LLE: Unable to fully assess due to pain   Cervical / Trunk Assessment Cervical / Trunk Assessment: Kyphotic   Communication Communication Communication: HOH   Cognition Arousal/Alertness: Awake/alert Behavior During Therapy: Anxious Overall Cognitive Status: No family/caregiver present to determine baseline cognitive functioning                                 General Comments: oriented to situation, h/o dementia   General Comments  RA 89% so placed back on 1 L [place] o2    Exercises     Shoulder Instructions      Home Living Family/patient expects to be discharged to:: Skilled nursing facility                                         Prior Functioning/Environment Level of Independence: Needs assistance  Gait / Transfers Assistance Needed: uses walker and built up shoe to get around, unclear how he manages his home              OT Problem List: Decreased strength;Decreased activity tolerance;Impaired balance (sitting and/or standing);Decreased safety awareness;Decreased knowledge of use of DME or AE;Decreased knowledge of precautions;Pain      OT Treatment/Interventions: Self-care/ADL training;Therapeutic exercise;Neuromuscular education;Energy conservation;DME and/or AE instruction;Manual therapy;Modalities;Therapeutic activities;Patient/family education;Balance training    OT Goals(Current goals can be found in the care plan section) Acute Rehab OT Goals Patient Stated Goal: to get pain medication for LLE and RN notified OT Goal Formulation: Patient unable to participate in goal setting Time For Goal Achievement: 01/02/20 Potential to Achieve Goals: Good  OT Frequency: Min 2X/week   Barriers to D/C: Decreased caregiver support          Co-evaluation PT/OT/SLP Co-Evaluation/Treatment: Yes Reason for Co-Treatment: Complexity of the patient's impairments (multi-system involvement);Necessary to address cognition/behavior during functional activity;For patient/therapist safety;To address functional/ADL transfers   OT goals addressed during session: ADL's and self-care;Proper use of Adaptive equipment and DME;Strengthening/ROM      AM-PAC OT "6 Clicks" Daily Activity     Outcome Measure Help from another person eating meals?: A Little Help from another person taking care of personal grooming?: A Little Help from another person toileting, which includes using toliet, bedpan, or urinal?: Total Help from another person bathing (including washing, rinsing, drying)?: Total Help from another person to put on and taking off regular upper body clothing?: A Little Help from another person to put on and taking  off regular lower body clothing?: Total 6 Click Score: 12   End of Session Equipment Utilized During Treatment: Oxygen Nurse Communication: Mobility status;Precautions;Need for lift equipment  Activity Tolerance: Patient tolerated treatment well Patient left: in bed;with call bell/phone within reach;with bed alarm set  OT Visit Diagnosis: Unsteadiness on feet (R26.81);Muscle weakness (generalized) (M62.81)                Time: [id number] OT Time Calculation (min): 34 min Charges:  OT General Charges $OT Visit: 1 Visit OT Evaluation $OT Eval Moderate Complexity: 1 Mod   Brynn, OTR/L  Acute Rehabilitation Services Pager: [phone] Office: [phone] .   Jeri Modena 12/19/2019, 1:10 PM

## 2019-12-19 NOTE — Progress Notes (Addendum)
Physical Therapy Treatment Patient Details Name: Casey Gilmore MRN: [id number] DOB: 26-Jun-1941 Today's Date: 12/19/2019    History of Present Illness DAVIO RISKIN is a 79 y.o. male with medical history significant of UGI bleeding; s/p bioprosthetic AVR; afib; CAD; HTN; HLD; DM; COPD; and vocal cord CA presenting with fall.  He was found to have hypoglycemia with multiple rib fractures and a small pneumothorax.    PT Comments    Patient seen for mobility progression. Pt continues to be very limited by L knee pain and is very guarded with little ROM. Pt able to get to EOB this session with max/tota A +2 and increased time due to anxiety/pain with mobility. Pt on 1L O2 via [place] to maintain SpO2 >90%. Continue to progress as tolerated with anticipated d/c to SNF for further skilled PT services.      Follow Up Recommendations  SNF;Supervision/Assistance - 24 hour     Equipment Recommendations  None recommended by PT    Recommendations for Other Services       Precautions / Restrictions Precautions Precautions: Fall Precaution Comments: L leg length discrepancy    Mobility  Bed Mobility Overal bed mobility: Needs Assistance Bed Mobility: Supine to Sit;Sit to Supine     Supine to sit: HOB elevated;+2 for physical assistance;Max assist Sit to supine: +2 for physical assistance;Max assist   General bed mobility comments: pt keeping R LE hooked under LLE throughout all transfer. pt needs extended time to work toward EOB. pt requires pad and helicopter to EOB with therapist maintaining LLE. pt eob unable to demonstrate knee flexion and reports pain with ankle pumps.   Transfers                 General transfer comment: NT--L knee too painful to attempt  Ambulation/Gait                 Stairs             Wheelchair Mobility    Modified Rankin (Stroke Patients Only)       Balance                                             Cognition Arousal/Alertness: Awake/alert Behavior During Therapy: Anxious Overall Cognitive Status: No family/caregiver present to determine baseline cognitive functioning                                 General Comments: oriented to situation, h/o dementia      Exercises      General Comments General comments (skin integrity, edema, etc.): SpO2 89% on RA; 1L O2 donned end of session and back up to 91%      Pertinent Vitals/Pain Pain Assessment: Faces Faces Pain Scale: Hurts whole lot Pain Location: L LE with movement and L ribs (L foot painful with tactile input) Pain Descriptors / Indicators: Grimacing;Moaning;Guarding Pain Intervention(s): Monitored during session;Limited activity within patient's tolerance    Home Living Family/patient expects to be discharged to:: Skilled nursing facility                    Prior Function Level of Independence: Needs assistance  Gait / Transfers Assistance Needed: uses walker and built up shoe to get around, unclear how he manages his home  PT Goals (current goals can now be found in the care plan section) Acute Rehab PT Goals Patient Stated Goal: to get pain medication for LLE and RN notified Progress towards PT goals: Progressing toward goals    Frequency    Min 3X/week      PT Plan Current plan remains appropriate    Co-evaluation PT/OT/SLP Co-Evaluation/Treatment: Yes Reason for Co-Treatment: Complexity of the patient's impairments (multi-system involvement);Necessary to address cognition/behavior during functional activity;For patient/therapist safety;To address functional/ADL transfers PT goals addressed during session: Mobility/safety with mobility OT goals addressed during session: ADL's and self-care;Proper use of Adaptive equipment and DME;Strengthening/ROM      AM-PAC PT "6 Clicks" Mobility   Outcome Measure  Help needed turning from your back to your side while in a flat bed without  using bedrails?: Total Help needed moving from lying on your back to sitting on the side of a flat bed without using bedrails?: Total Help needed moving to and from a bed to a chair (including a wheelchair)?: Total Help needed standing up from a chair using your arms (e.g., wheelchair or bedside chair)?: Total Help needed to walk in hospital room?: Total Help needed climbing 3-5 steps with a railing? : Total 6 Click Score: 6    End of Session   Activity Tolerance: Patient limited by pain Patient left: in bed;with call bell/phone within reach;with bed alarm set Nurse Communication: Mobility status;Other (comment)(L LE pain) PT Visit Diagnosis: Other abnormalities of gait and mobility (R26.89);Pain Pain - Right/Left: Left Pain - part of body: Knee     Time: [id number] PT Time Calculation (min) (ACUTE ONLY): 34 min  Charges:  $Therapeutic Activity: 8-22 mins                     Earney Navy, PTA Acute Rehabilitation Services Pager: [phone] Office: [phone]     Darliss Cheney 12/19/2019, 1:21 PM

## 2019-12-19 NOTE — TOC Initial Note (Signed)
Transition of Care [facility]) - Initial/Assessment Note    Patient Details  Name: Casey Gilmore MRN: [id number] Date of Birth: [birth year]  Transition of Care [facility]) CM/SW Contact:    Alberteen Sam, [place] Phone Number: [phone] 12/19/2019, 1:30 PM  Clinical Narrative:                  CSW spoke with patient's son Marden Noble regarding SNF recommendation for short term rehab, he is in agreement with this and reports patient has been to [facility] in the past which is preference. He is agreeable for referrals to be sent out for bed offers.   CSW to follow up with [facility] on bed offers.   When patient near medical readiness for discharge to SNF will need updated COVID test.   Expected Discharge Plan: Skilled Nursing Facility Barriers to Discharge: Continued Medical Work up   Patient Goals and CMS Choice   CMS Medicare.gov Compare Post Acute Care list provided to:: Patient Represenative (must comment)(son Doug) Choice offered to / list presented to : Adult Children  Expected Discharge Plan and Services Expected Discharge Plan: [place] Acute Care Choice: [place] Living arrangements for the past 2 months: Single Family Home                                      Prior Living Arrangements/Services Living arrangements for the past 2 months: Single Family Home Lives with:: Self Patient language and need for interpreter reviewed:: Yes Do you feel safe going back to the place where you live?: No   needs short term rehab  Need for Family Participation in Patient Care: Yes (Comment) Care giver support system in place?: Yes (comment)   Criminal Activity/Legal Involvement Pertinent to Current Situation/Hospitalization: No - Comment as needed  Activities of Daily Living Home Assistive Devices/Equipment: None ADL Screening (condition at time of admission) Patient's cognitive ability adequate to safely complete daily activities?: No Is the  patient deaf or have difficulty hearing?: Yes Does the patient have difficulty seeing, even when wearing glasses/contacts?: No Does the patient have difficulty concentrating, remembering, or making decisions?: No Patient able to express need for assistance with ADLs?: Yes Does the patient have difficulty dressing or bathing?: Yes Independently performs ADLs?: Yes (appropriate for developmental age) Does the patient have difficulty walking or climbing stairs?: No Weakness of Legs: Left Weakness of Arms/Hands: Left  Permission Sought/Granted Permission sought to share information with : Case Manager, Customer service manager, Family Supports Permission granted to share information with : Yes, Verbal Permission Granted  Share Information with NAME: Marden Noble  Permission granted to share info w AGENCY: SNFs  Permission granted to share info w Relationship: son  Permission granted to share info w Contact Information: [phone]  Emotional Assessment Appearance:: Appears stated age Attitude/Demeanor/Rapport: Unable to Assess Affect (typically observed): Unable to Assess Orientation: : Oriented to Self, Oriented to Place, Oriented to Situation Alcohol / Substance Use: Not Applicable Psych Involvement: No (comment)  Admission diagnosis:  Hypoglycemia [E16.2] Fall [W19.XXXA] Elevated troponin [R77.8] Pneumothorax, left [J93.9] Acute respiratory failure with hypoxia (HCC) [J96.01] Traumatic pneumothorax, initial encounter [S27.0XXA] Fall, initial encounter [id number]] Closed fracture of multiple ribs of left side, initial encounter [S22.42XA] Patient Active Problem List   Diagnosis Date Noted  . Fall 12/17/2019  . Pneumothorax, closed, traumatic, initial encounter 12/17/2019  . Atrial fibrillation with RVR ([place])  12/17/2019  . Persistent atrial fibrillation ([place])   . Multiple closed fractures of ribs of left side   . Back pain 07/26/2018  . Frequent falls   . Leukocytosis   .  Right hip pain   . Orthostatic hypotension   . Chronic pain syndrome   . Constipation due to pain medication   . Postoperative pain   . Hypoalbuminemia due to protein-calorie malnutrition ([place])   . Closed left subtrochanteric femur fracture ([place]) 04/24/2018  . Acute blood loss anemia   . Anemia of chronic disease   . Diabetes mellitus type 2 in nonobese (HCC)   . Hyperlipidemia   . Aortic valve stenosis   . History of GI bleed   . Anemia 04/17/2018  . Closed left hip fracture ([place]) 04/15/2018  . Closed fracture of left proximal humerus 01/19/2018  . Closed fracture of left distal radius and ulna 01/19/2018  . 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 COPD (chronic obstructive pulmonary disease) with chronic bronchitis ([place]) 10/29/2014  . Chronic diastolic heart failure ([place]) 06/24/2014  . Benign essential HTN 06/24/2014  . 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 History of non-ST elevation myocardial infarction (NSTEMI) 06/24/2014  . Diabetes mellitus ([place]) 10/17/2011  . Osteoarthritis 10/17/2011   PCP:  Mayra Neer, MD Pharmacy:   [facility] [place], [address] [place] [address] Phone: [phone] Fax: [phone]     Social Determinants of Health (SDOH) Interventions    Readmission Risk Interventions No flowsheet data found.

## 2019-12-19 NOTE — Progress Notes (Signed)
PROGRESS NOTE    Casey Gilmore  [id number] DOB: 18-Sep-1941 DOA: 12/17/2019 PCP: Mayra Neer, MD   Brief Narrative:  Casey Gilmore is a 79 y.o. male with medical history significant of UGI bleeding; s/p bioprosthetic AVR; afib; CAD; HTN; HLD; DM; COPD; and vocal cord CA presenting with fall.  He was found to have hypoglycemia with multiple rib fractures and a small pneumothorax.  Surgery is following and recommending conservative management.  Subjective: Patient was complaining of some chest pain.  Denies any shortness of breath.  He was able to answer questions appropriately.  Assessment & Plan:   Principal Problem:   Fall Active Problems:   Diabetes mellitus ([place])   Chronic diastolic heart failure (HCC)   Benign essential HTN   COPD (chronic obstructive pulmonary disease) with chronic bronchitis (HCC)   Chronic pain syndrome   Multiple closed fractures of ribs of left side   Pneumothorax, closed, traumatic, initial encounter   Atrial fibrillation with RVR ([place])  Fall with rib fractures and PTX Patient with fall at home on 12/16/2019, suffering from multiple rib fractures and traumatic PTX. Chest x-ray with stable pneumothorax.  Feeling little better today. Surgery is recommending conservative management. -Continue with incentive spirometry and flutter valve. -Continue with pain management. -Add lidocaine patch for rib pain. -PT/OT -recommending SNF placement.  Social worker consult for placement.  Acute on chronic hypoxic respiratory failure Patient has underlying COPD but is not on home O2.  Patient was saturating well on room air this morning.  Fib with RVR.  Initially managed with Cardizem infusion.  Now back in sinus rhythm.  TSH within normal limit.  Most likely triggered with injury. Cardiology is following-appreciate the recommendations. -IV Cardizem with p.o. -Not a candidate for anticoagulation because of falls and history of GI  bleed. -Echocardiogram with normal ejection fraction and grade 2 diastolic dysfunction. -Cardiology signed off today.  Diastolic CHF.  Does not appear volume overload.  History of bioprosthetic valve. -Cardiology ordered repeat echo-no significant change, grade 2 diastolic dysfunction with normal ejection fraction.  Hypocalcemia/hypokalemia.  Corrected calcium of 7.7 and potassium of 4.3. -Phosphorus of 3.1 and parathyroid hormone results pending. -He was given another dose of calcium gluconate by overnight staff. -No need for IV calcium at this time. -Start him on oral supplement.  Diabetes.  Initially hypoglycemic, now CBG mildly elevated.   A1c 5.8-high risk for hypoglycemia.  -Continue to monitor. -SSI.  Chronic pain.  On Vicodin and Robaxin at home. [place] controlled substance reporting system was appropriate. -Being managed with oxycodone and Robaxin currently. -Add K pad and lidocaine patch.  Objective: Vitals:   12/19/19 0426 12/19/19 0912 12/19/19 0933 12/19/19 1157  BP: 125/79 115/67  (!) 155/88  Pulse: 73 80 76 80  Resp: 17 18    Temp: 98.1 F (36.7 C) 98.4 F (36.9 C)  98.9 F (37.2 C)  TempSrc: Oral Oral  Oral  SpO2: 97%  96% 95%  Weight:      Height:        Intake/Output Summary (Last 24 hours) at 12/19/2019 1334 Last data filed at 12/19/2019 0600 Gross per 24 hour  Intake 360 ml  Output 650 ml  Net -290 ml   Filed Weights   12/17/19 0938 12/18/19 1417 12/19/19 0144  Weight: 73.9 kg 75.8 kg 74.8 kg    Examination:  General exam: Appears calm and comfortable  Respiratory system: Clear to auscultation. Respiratory effort normal. Cardiovascular system: S1 & S2 heard, RRR. No JVD,  murmurs, rubs, gallops or clicks. Gastrointestinal system: Soft, nontender, nondistended, bowel sounds positive. Central nervous system: Alert and oriented. No focal neurological deficits. Extremities: No edema, no cyanosis, pulses intact and symmetrical. Skin: Multiple  ecchymosis and a bandage over her right eye. Psychiatry: Judgement and insight appear normal. Mood & affect appropriate.    DVT prophylaxis: SCDs Code Status: DNR Family Communication: No family at bedside. Disposition Plan: SNF placement once bed is available. Medically stable Not a safe discharge to home.  Consultants:   Surgery  Cardiology  Procedures:  Antimicrobials:   Data Reviewed: I have personally reviewed following labs and imaging studies  CBC: Recent Labs  Lab 12/17/19 0955 12/18/19 0510  WBC 16.2* 12.7*  NEUTROABS 14.6*  --   HGB 11.1* 9.5*  HCT 37.4* 31.2*  MCV 77.1* 75.4*  PLT 420* 123456   Basic Metabolic Panel: Recent Labs  Lab 12/17/19 0955 12/18/19 0510 12/18/19 1244 12/19/19 0439  NA 143 142 140 143  K 3.4* 3.0* 3.6 4.3  CL 100 101 99 103  CO2 26 28 27 28   GLUCOSE 53* 96 195* 167*  BUN 22 22 24* 26*  CREATININE 1.68* 1.23 1.26* 1.22  CALCIUM 6.5* 6.1* 6.3* 6.4*  PHOS  --   --  3.9 3.1   GFR: Estimated Creatinine Clearance: 52.8 mL/min (by C-G formula based on SCr of 1.22 mg/dL). Liver Function Tests: Recent Labs  Lab 12/18/19 0510 12/18/19 1244 12/19/19 0439  AST 16  --   --   ALT 8  --   --   ALKPHOS 71  --   --   BILITOT 1.1  --   --   PROT 5.3*  --   --   ALBUMIN 2.5* 2.5* 2.4*   No results for input(s): LIPASE, AMYLASE in the last 168 hours. No results for input(s): AMMONIA in the last 168 hours. Coagulation Profile: No results for input(s): INR, PROTIME in the last 168 hours. Cardiac Enzymes: No results for input(s): CKTOTAL, CKMB, CKMBINDEX, TROPONINI in the last 168 hours. BNP (last 3 results) No results for input(s): PROBNP in the last 8760 hours. HbA1C: Recent Labs    12/17/19 2000  HGBA1C 5.8*   CBG: Recent Labs  Lab 12/18/19 2201 12/19/19 0114 12/19/19 0358 12/19/19 0804 12/19/19 1126  GLUCAP 154* 149* 146* 154* 178*   Lipid Profile: No results for input(s): CHOL, HDL, LDLCALC, TRIG, CHOLHDL,  LDLDIRECT in the last 72 hours. Thyroid Function Tests: Recent Labs    12/17/19 2000  TSH 2.092   Anemia Panel: No results for input(s): VITAMINB12, FOLATE, FERRITIN, TIBC, IRON, RETICCTPCT in the last 72 hours. Sepsis Labs: No results for input(s): PROCALCITON, LATICACIDVEN in the last 168 hours.  Recent Results (from the past 240 hour(s))  Urine culture     Status: Abnormal   Collection Time: 12/17/19  9:55 AM   Specimen: Urine, Random  Result Value Ref Range Status   Specimen Description URINE, RANDOM  Final   Special Requests NONE  Final   Culture (A)  Final    <10,000 COLONIES/mL INSIGNIFICANT GROWTH Performed at [facility], [address] [place], [place] [address]    Report Status 12/18/2019 FINAL  Final  SARS CORONAVIRUS 2 (TAT 6-24 HRS) Nasopharyngeal Nasopharyngeal Swab     Status: None   Collection Time: 12/17/19  2:30 PM   Specimen: Nasopharyngeal Swab  Result Value Ref Range Status   SARS Coronavirus 2 NEGATIVE NEGATIVE Final    Comment: (NOTE) SARS-CoV-2 target  nucleic acids are NOT DETECTED. The SARS-CoV-2 RNA is generally detectable in upper and lower respiratory specimens during the acute phase of infection. Negative results do not preclude SARS-CoV-2 infection, do not rule out co-infections with other pathogens, and should not be used as the sole basis for treatment or other patient management decisions. Negative results must be combined with clinical observations, patient history, and epidemiological information. The expected result is Negative. Fact Sheet for Patients: SugarRoll.be Fact Sheet for Healthcare Providers: https://www.woods-mathews.com/ This test is not yet approved or cleared by the [place] FDA and  has been authorized for detection and/or diagnosis of SARS-CoV-2 by FDA under an Emergency Use Authorization (EUA). This EUA will remain  in effect (meaning this test can be used) for the  duration of the COVID-19 declaration under Section 56 4(b)(1) of the Act, 21 U.S.C. section 360bbb-3(b)(1), unless the authorization is terminated or revoked sooner. Performed at [facility], [address], [place], [place] [address]      Radiology Studies: DG CHEST PORT 1 VIEW  Result Date: 12/18/2019 CLINICAL DATA:  Pain following fall EXAM: PORTABLE CHEST 1 VIEW COMPARISON:  December 17, 2019 FINDINGS: Multiple displaced rib fractures on the left are again noted with small left apical pneumothorax, stable. No tension component. There is subcutaneous air. There is persistent left lower lobe consolidation with small left pleural effusion. Right lung is clear. Heart size is upper normal with pulmonary vascularity normal. There is aortic atherosclerosis. Patient is status post aortic valve replacement. No adenopathy. Bones are osteoporotic. Postoperative change noted in left proximal humerus. IMPRESSION: Displaced rib fractures on the left appear grossly stable. Small left apical pneumothorax is stable as is subcutaneous air on the left. There remains left lower lobe consolidation with small left pleural effusion. Right lung is clear. Cardiac silhouette is stable with postoperative changes secondary to aortic valve replacement. Aortic Atherosclerosis (ICD10-I70.0). Appearance similar compared to 1 day prior. Electronically Signed   By: Lowella Grip III M.D.   On: 12/18/2019 08:02   DG CHEST PORT 1 VIEW  Result Date: 12/17/2019 CLINICAL DATA:  Acute respiratory failure EXAM: PORTABLE CHEST 1 VIEW COMPARISON:  Earlier same day FINDINGS: Multiple left rib fractures are again identified with adjacent subcutaneous emphysema. Probable small left apical pneumothorax remains unchanged. Patchy left lung density, which may reflect atelectasis/contusion. Stable cardiomediastinal contours with evidence of aortic valve replacement. IMPRESSION: Probable small left apical pneumothorax remains unchanged.  Patchy left lung density probably reflecting atelectasis/contusion. Electronically Signed   By: Macy Mis M.D.   On: 12/17/2019 16:59   ECHOCARDIOGRAM COMPLETE  Result Date: 12/18/2019   ECHOCARDIOGRAM REPORT   Patient Name:   Casey Gilmore Date of Exam: 12/18/2019 Medical Rec #:  [id number]         Height:       72.0 in Accession #:    [id number]        Weight:       162.9 lb Date of Birth:  [birth year]         BSA:          1.95 m Patient Age:    89 years          BP:           115/69 mmHg Patient Gender: M                 HR:           72 bpm. Exam Location:  Inpatient Procedure:  2D Echo Indications:    aortic stenosis  History:        Patient has prior history of Echocardiogram examinations, most                 recent 04/16/2018. COPD; Risk Factors:Hypertension, Dyslipidemia,                 Diabetes and Former Smoker. Aortic Valve: A 80mm Magna                 bioprosthetic aortic valve Procedure Date: 10/15/2011 TAVR.                 NSTEMI.  Sonographer:    Jannett Celestine RDCS (AE) Referring Phys: [place]  Sonographer Comments: No parasternal window and no subcostal window. IMPRESSIONS  1. Technically limited study. No parasternal window and no subcostal window.  2. Left ventricular ejection fraction, by visual estimation, is 55 to 60%. The left ventricle has normal function. There is no left ventricular hypertrophy.  3. Probable mild hypokinesis of the left ventricular, basal inferoseptal wall and inferior wall.  4. Left ventricular diastolic parameters are consistent with Grade II diastolic dysfunction (pseudonormalization).  5. Elevated left atrial pressure.  6. Global right ventricle has normal systolic function.The right ventricular size is not well visualized. Right vetricular wall thickness was not assessed.  7. Left atrial size was mildly dilated.  8. Right atrial size was not well visualized.  9. Moderate mitral annular calcification. 10. The mitral valve is degenerative. No  evidence of mitral valve regurgitation. No evidence of mitral stenosis. 11. The tricuspid valve is not well visualized. 12. Bioprosthetic aortic valve was poorly seen and gradients could not be accurately assessed. Aortic valve regurgitation is not visualized. 13. The pulmonic valve was not well visualized. Pulmonic valve regurgitation is not visualized. 14. TR signal is inadequate for assessing pulmonary artery systolic pressure. 15. The interatrial septum was not assessed. 16. The aortic root was not well visualized. FINDINGS  Left Ventricle: Left ventricular ejection fraction, by visual estimation, is 55 to 60%. The left ventricle has normal function. Mild hypokinesis of the left ventricular, basal inferoseptal wall and inferior wall. The left ventricle demonstrates regional  wall motion abnormalities. The left ventricular internal cavity size was the left ventricle is normal in size. There is no left ventricular hypertrophy. Left ventricular diastolic parameters are consistent with Grade II diastolic dysfunction (pseudonormalization). Elevated left atrial pressure. Right Ventricle: The right ventricular size is not well visualized. Right vetricular wall thickness was not assessed. Global RV systolic function is has normal systolic function. Left Atrium: Left atrial size was mildly dilated. Right Atrium: Right atrial size was not well visualized Pericardium: There is no evidence of pericardial effusion. Mitral Valve: The mitral valve is degenerative in appearance. Moderate mitral annular calcification. No evidence of mitral valve regurgitation. No evidence of mitral valve stenosis by observation. Tricuspid Valve: The tricuspid valve is not well visualized. Tricuspid valve regurgitation is not demonstrated. Aortic Valve: The aortic valve was not well visualized. Aortic valve regurgitation is not visualized. 20mm Magna bioprosthetic aortic valve valve is present in the aortic position. Procedure Date: 10/15/2011.  Bioprosthetic aortic valve was poorly seen and gradients could not be accurately assessed. Pulmonic Valve: The pulmonic valve was not well visualized. Pulmonic valve regurgitation is not visualized. Pulmonic regurgitation is not visualized. Aorta: The aortic root was not well visualized. IAS/Shunts: The interatrial septum was not assessed.   Diastology LV e' lateral:  12.18 cm/s LV E/e' lateral: 10.2 LV e' medial:    5.55 cm/s LV E/e' medial:  22.3  LEFT ATRIUM             Index LA Vol (A2C):   42.7 ml 21.87 ml/m LA Vol (A4C):   69.6 ml 35.64 ml/m LA Biplane Vol: 55.8 ml 28.58 ml/m  AORTIC VALVE LVOT Vmax:   79.30 cm/s LVOT Vmean:  56.300 cm/s LVOT VTI:    0.158 m MITRAL VALVE MV Area (PHT): 2.29 cm             SHUNTS MV PHT:        95.99 msec           Systemic VTI: 0.16 m MV Decel Time: 331 msec MV E velocity: 124.00 cm/s 103 cm/s  Dani Gobble Croitoru MD Electronically signed by Sanda Klein MD Signature Date/Time: 12/18/2019/1:21:07 PM    Final    DG Hip Port [place] W or Wo Pelvis 1 View Left  Result Date: 12/17/2019 CLINICAL DATA:  Left hip pain after fall. EXAM: DG HIP (WITH OR WITHOUT PELVIS) 1V PORT LEFT COMPARISON:  Apr 18, 2018. FINDINGS: Status post surgical fusion of the left hip joint. Status post surgical internal fixation of proximal left femoral fracture. Status post right total hip arthroplasty. No definite acute fracture or dislocation is noted. IMPRESSION: Postsurgical changes as described above. No definite acute abnormality is noted. Electronically Signed   By: Marijo Conception M.D.   On: 12/17/2019 13:54    Scheduled Meds: . acetaminophen  650 mg Oral Q6H  . calcium carbonate  1 tablet Oral BID WC  . diltiazem  120 mg Oral Daily  . docusate sodium  100 mg Oral BID  . insulin aspart  0-15 Units Subcutaneous TID WC  . lidocaine  1 patch Transdermal Q24H  . lidocaine-EPINEPHrine  10 mL Intradermal Once  . methocarbamol  500 mg Oral TID  . pantoprazole  40 mg Oral Daily  . sodium  chloride flush  3 mL Intravenous Q12H   Continuous Infusions:   LOS: 2 days   Time spent: 30 minutes  Lorella Nimrod, MD [facility] Pager [phone]  If 7PM-7AM, please contact night-coverage [email or web address] Password [facility] 12/19/2019, 1:34 PM   This record has been created using Systems analyst. Errors have been sought and corrected,but may not always be located. Such creation errors do not reflect on the standard of care.

## 2019-12-19 NOTE — NC FL2 (Signed)
[place] MEDICAID FL2 LEVEL OF CARE SCREENING TOOL     IDENTIFICATION  Patient Name: Casey Gilmore Birthdate: [birth year] Sex: male Admission Date (Current Location): 12/17/2019  [facility] and [place] Number:  Herbalist and Address:  [facility], [place] [address] [place], [place] [address]      Provider Number: [id number]  Attending Physician Name and Address:  Lorella Nimrod, MD  Relative Name and Phone Number:  Marden Noble (son) [phone]    Current Level of Care: Hospital Recommended Level of Care: [place] Prior Approval Number:    Date Approved/Denied:   PASRR Number: [id number]  Discharge Plan: SNF    Current Diagnoses: Patient Active Problem List   Diagnosis Date Noted  . Fall 12/17/2019  . Pneumothorax, closed, traumatic, initial encounter 12/17/2019  . Atrial fibrillation with RVR ([place]) 12/17/2019  . Persistent atrial fibrillation ([place])   . Multiple closed fractures of ribs of left side   . Back pain 07/26/2018  . Frequent falls   . Leukocytosis   . Right hip pain   . Orthostatic hypotension   . Chronic pain syndrome   . Constipation due to pain medication   . Postoperative pain   . Hypoalbuminemia due to protein-calorie malnutrition ([place])   . Closed left subtrochanteric femur fracture ([place]) 04/24/2018  . Acute blood loss anemia   . Anemia of chronic disease   . Diabetes mellitus type 2 in nonobese (HCC)   . Hyperlipidemia   . Aortic valve stenosis   . History of GI bleed   . Anemia 04/17/2018  . Closed left hip fracture ([place]) 04/15/2018  . Closed fracture of left proximal humerus 01/19/2018  . Closed fracture of left distal radius and ulna 01/19/2018  . 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 COPD (chronic obstructive pulmonary disease) with chronic bronchitis ([place]) 10/29/2014  . Chronic diastolic heart failure ([place]) 06/24/2014  . Benign essential HTN 06/24/2014  . Aortic valve  replaced 06/24/2014  . History of non-ST elevation myocardial infarction (NSTEMI) 06/24/2014  . Diabetes mellitus ([place]) 10/17/2011  . Osteoarthritis 10/17/2011    Orientation RESPIRATION BLADDER Height & Weight     Self, Time, Place  O2(1 to 2 L/min nasa cannula) Incontinent, External catheter Weight: 165 lb (74.8 kg) Height:  6' (182.9 cm)  BEHAVIORAL SYMPTOMS/MOOD NEUROLOGICAL BOWEL NUTRITION STATUS      Incontinent Diet(see discharge summary)  AMBULATORY STATUS COMMUNICATION OF NEEDS Skin   Extensive Assist Verbally Other (Comment), Skin abrasions(abrasion right head, ecchymosis left arm and right hand, excoriated buttocks and left leg,pressure injury buttocks)                       Personal Care Assistance Level of Assistance  Bathing, Dressing, Total care, Feeding Bathing Assistance: Maximum assistance Feeding assistance: Limited assistance Dressing Assistance: Maximum assistance Total Care Assistance: Maximum assistance   Functional Limitations Info  Sight, Hearing, Speech Sight Info: Impaired Hearing Info: Adequate Speech Info: Adequate    SPECIAL CARE FACTORS FREQUENCY  PT (By licensed PT), OT (By licensed OT)     PT Frequency: min 5x weekly OT Frequency: min 5x weekly            Contractures Contractures Info: Not present    Additional Factors Info  Code Status, Allergies Code Status Info: DNR Allergies Info: No Known Allergies           Current Medications (12/19/2019):  This is the current hospital active  medication list Current Facility-Administered Medications  Medication Dose Route Frequency Provider Last Rate Last Admin  . acetaminophen (TYLENOL) tablet 650 mg  650 mg Oral Q6H PRN Karmen Bongo, MD       Or  . acetaminophen (TYLENOL) suppository 650 mg  650 mg Rectal Q6H PRN Karmen Bongo, MD      . acetaminophen (TYLENOL) tablet 650 mg  650 mg Oral Q6H Karmen Bongo, MD   650 mg at 12/19/19 0844  . calcium carbonate (OS-CAL - dosed  in mg of elemental calcium) tablet 500 mg of elemental calcium  1 tablet Oral BID WC Lorella Nimrod, MD   500 mg of elemental calcium at 12/19/19 0856  . diltiazem (CARDIZEM CD) 24 hr capsule 120 mg  120 mg Oral Daily Martinique, Peter M, MD   120 mg at 12/19/19 0914  . docusate sodium (COLACE) capsule 100 mg  100 mg Oral BID Karmen Bongo, MD   100 mg at 12/19/19 0914  . insulin aspart (novoLOG) injection 0-15 Units  0-15 Units Subcutaneous TID WC Karmen Bongo, MD   3 Units at 12/19/19 1200  . ipratropium (ATROVENT) nebulizer solution 0.5 mg  0.5 mg Nebulization Q6H PRN Karmen Bongo, MD      . lidocaine (LIDODERM) 5 % 1 patch  1 patch Transdermal Q24H Lorella Nimrod, MD   1 patch at 12/19/19 1204  . lidocaine-EPINEPHrine (XYLOCAINE W/EPI) 2 %-1:200000 (PF) injection 10 mL  10 mL Intradermal Once Karmen Bongo, MD      . methocarbamol (ROBAXIN) tablet 500 mg  500 mg Oral TID Karmen Bongo, MD   500 mg at 12/19/19 0845  . morphine 2 MG/ML injection 1-4 mg  1-4 mg Intravenous Q2H PRN Karmen Bongo, MD   2 mg at 12/18/19 1213  . ondansetron (ZOFRAN) tablet 4 mg  4 mg Oral Q6H PRN Karmen Bongo, MD       Or  . ondansetron [facility]) injection 4 mg  4 mg Intravenous Q6H PRN Karmen Bongo, MD      . oxyCODONE (Oxy IR/ROXICODONE) immediate release tablet 5-10 mg  5-10 mg Oral Q4H PRN Karmen Bongo, MD   5 mg at 12/19/19 1204  . pantoprazole (PROTONIX) EC tablet 40 mg  40 mg Oral Daily Karmen Bongo, MD   40 mg at 12/19/19 0914  . sodium chloride flush (NS) 0.9 % injection 3 mL  3 mL Intravenous Q12H Karmen Bongo, MD   3 mL at 12/19/19 [id number]     Discharge Medications: Please see discharge summary for a list of discharge medications.  Relevant Imaging Results:  Relevant Lab Results:   Additional Information SSN: [id number]  Alberteen Sam, LCSW

## 2019-12-19 NOTE — Significant Event (Signed)
Rapid Response Event Note  While on 3E, nurse was concerned with how the patient's lung sounds. Patient was alert, able to talk to full sentences, endorsed chest wall discomfort on the LT side, + multiple rib fx on LT and stable PTX. No sub-q air, no paradoxical breathing. He did have some phlegm in his throat and I think that is what the nurse heard. I placed on pillow on his chest and side, asked him to cough and he did several times, cleared the phlegm. Lung sounds - clear throughout mostly, diminished in the bases - overall good air movement. 97% on 2L [place].  Plan: -- Continue CDB with splinting, encourage IS/FV, and OOB with assistance/ambulation  -- I asked the nurse to see if the primary provider could order Lidoderm Patches for pain relief  Ronald Vinsant R

## 2019-12-19 NOTE — Progress Notes (Signed)
Progress Note  Patient Name: Casey Gilmore Date of Encounter: 12/19/2019  Primary Cardiologist: No primary care provider on file. New  Subjective   Patient still has some chest wall pain. Denies SOB   Inpatient Medications    Scheduled Meds: . acetaminophen  650 mg Oral Q6H  . calcium carbonate  1 tablet Oral BID WC  . diltiazem  120 mg Oral Daily  . docusate sodium  100 mg Oral BID  . insulin aspart  0-15 Units Subcutaneous TID WC  . lidocaine-EPINEPHrine  10 mL Intradermal Once  . methocarbamol  500 mg Oral TID  . pantoprazole  40 mg Oral Daily  . sodium chloride flush  3 mL Intravenous Q12H   Continuous Infusions: . calcium gluconate     PRN Meds: acetaminophen **OR** acetaminophen, ipratropium, morphine injection, ondansetron **OR** ondansetron (ZOFRAN) IV, oxyCODONE   Vital Signs    Vitals:   12/18/19 1650 12/18/19 2207 12/19/19 0144 12/19/19 0426  BP: 115/77 126/76 140/69 125/79  Pulse: 83 80 72 73  Resp: 18 16 16 17   Temp: 98.6 F (37 C) 99.3 F (37.4 C) 98.5 F (36.9 C) 98.1 F (36.7 C)  TempSrc: Oral Oral Oral Oral  SpO2: 90% 94% 98% 97%  Weight:   74.8 kg   Height:        Intake/Output Summary (Last 24 hours) at 12/19/2019 0843 Last data filed at 12/19/2019 0600 Gross per 24 hour  Intake 360 ml  Output 650 ml  Net -290 ml   Last 3 Weights 12/19/2019 12/18/2019 12/17/2019  Weight (lbs) 165 lb 167 lb 162 lb 14.7 oz  Weight (kg) 74.844 kg 75.751 kg 73.9 kg      Telemetry    Converted to NSR with PACs - Personally Reviewed  ECG    NSR with PACs - Personally Reviewed  Physical Exam   GEN: Elderly WM No acute distress.   Neck: No JVD Cardiac: RRR, no murmurs, rubs, or gallops.  Respiratory: Coarse BS bilaterally GI: Soft, nontender, non-distended  MS: No edema; No deformity. Neuro:  Nonfocal, oriented to [facility]. Knows he is here because of fall  Psych: Normal affect   Labs    High Sensitivity Troponin:   Recent Labs  Lab  12/17/19 0955 12/17/19 1420  TROPONINIHS 145* 128*      Chemistry Recent Labs  Lab 12/18/19 0510 12/18/19 1244 12/19/19 0439  NA 142 140 143  K 3.0* 3.6 4.3  CL 101 99 103  CO2 28 27 28   GLUCOSE 96 195* 167*  BUN 22 24* 26*  CREATININE 1.23 1.26* 1.22  CALCIUM 6.1* 6.3* 6.4*  PROT 5.3*  --   --   ALBUMIN 2.5* 2.5* 2.4*  AST 16  --   --   ALT 8  --   --   ALKPHOS 71  --   --   BILITOT 1.1  --   --   GFRNONAA 56* 54* 56*  GFRAA >60 >60 >60  ANIONGAP 13 14 12      Hematology Recent Labs  Lab 12/17/19 0955 12/18/19 0510  WBC 16.2* 12.7*  RBC 4.85 4.14*  HGB 11.1* 9.5*  HCT 37.4* 31.2*  MCV 77.1* 75.4*  MCH 22.9* 22.9*  MCHC 29.7* 30.4  RDW 19.3* 19.0*  PLT 420* 315    BNPNo results for input(s): BNP, PROBNP in the last 168 hours.   DDimer No results for input(s): DDIMER in the last 168 hours.   Radiology    CT Head  Wo Contrast  Result Date: 12/17/2019 CLINICAL DATA:  Altered mental status since fall yesterday. EXAM: CT HEAD WITHOUT CONTRAST CT CERVICAL SPINE WITHOUT CONTRAST TECHNIQUE: Multidetector CT imaging of the head and cervical spine was performed following the standard protocol without intravenous contrast. Multiplanar CT image reconstructions of the cervical spine were also generated. COMPARISON:  CT head and cervical spine dated January 15, 2018. 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 No evidence of hemorrhage, hydrocephalus, extra-axial collection or mass lesion/mass effect. Stable moderate atrophy and mild chronic microvascular ischemic changes. Vascular: Atherosclerotic vascular calcification of the carotid siphons. No hyperdense vessel. Skull: Normal. Negative for fracture or focal lesion. Sinuses/Orbits: No acute finding. Other: None. CT CERVICAL SPINE FINDINGS Alignment: Normal. Skull base and vertebrae: No acute fracture. No primary bone lesion or focal pathologic  process. Soft tissues and spinal canal: No prevertebral fluid or swelling. No visible canal hematoma. Disc levels: Moderate disc height loss and uncovertebral hypertrophy at C5-C6 and C6-C7, unchanged. Upper chest: Small left apical pneumothorax. Other: None. IMPRESSION: 1. 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 2. No acute cervical spine fracture. Stable moderate degenerative changes at C5-C6 and C6-C7. 3. Small left apical pneumothorax. Electronically Signed   By: Titus Dubin M.D.   On: 12/17/2019 13:09   CT Cervical Spine Wo Contrast  Result Date: 12/17/2019 CLINICAL DATA:  Altered mental status since fall yesterday. EXAM: CT HEAD WITHOUT CONTRAST CT CERVICAL SPINE WITHOUT CONTRAST TECHNIQUE: Multidetector CT imaging of the head and cervical spine was performed following the standard protocol without intravenous contrast. Multiplanar CT image reconstructions of the cervical spine were also generated. COMPARISON:  CT head and cervical spine dated January 15, 2018. 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 No evidence of hemorrhage, hydrocephalus, extra-axial collection or mass lesion/mass effect. Stable moderate atrophy and mild chronic microvascular ischemic changes. Vascular: Atherosclerotic vascular calcification of the carotid siphons. No hyperdense vessel. Skull: Normal. Negative for fracture or focal lesion. Sinuses/Orbits: No acute finding. Other: None. CT CERVICAL SPINE FINDINGS Alignment: Normal. Skull base and vertebrae: No acute fracture. No primary bone lesion or focal pathologic process. Soft tissues and spinal canal: No prevertebral fluid or swelling. No visible canal hematoma. Disc levels: Moderate disc height loss and uncovertebral hypertrophy at C5-C6 and C6-C7, unchanged. Upper chest: Small left apical pneumothorax. Other: None. IMPRESSION: 1. Small age  indeterminate lacunar infarcts in the right cerebellum, new since February 2019. 2. No acute cervical spine fracture. Stable moderate degenerative changes at C5-C6 and C6-C7. 3. Small left apical pneumothorax. Electronically Signed   By: Titus Dubin M.D.   On: 12/17/2019 13:09   DG CHEST PORT 1 VIEW  Result Date: 12/18/2019 CLINICAL DATA:  Pain following fall EXAM: PORTABLE CHEST 1 VIEW COMPARISON:  December 17, 2019 FINDINGS: Multiple displaced rib fractures on the left are again noted with small left apical pneumothorax, stable. No tension component. There is subcutaneous air. There is persistent left lower lobe consolidation with small left pleural effusion. Right lung is clear. Heart size is upper normal with pulmonary vascularity normal. There is aortic atherosclerosis. Patient is status post aortic valve replacement. No adenopathy. Bones are osteoporotic. Postoperative change noted in left proximal humerus. IMPRESSION: Displaced rib fractures on the left appear grossly stable. Small left apical pneumothorax is stable as is subcutaneous air on the left. There remains left  lower lobe consolidation with small left pleural effusion. Right lung is clear. Cardiac silhouette is stable with postoperative changes secondary to aortic valve replacement. Aortic Atherosclerosis (ICD10-I70.0). Appearance similar compared to 1 day prior. Electronically Signed   By: Lowella Grip III M.D.   On: 12/18/2019 08:02   DG CHEST PORT 1 VIEW  Result Date: 12/17/2019 CLINICAL DATA:  Acute respiratory failure EXAM: PORTABLE CHEST 1 VIEW COMPARISON:  Earlier same day FINDINGS: Multiple left rib fractures are again identified with adjacent subcutaneous emphysema. Probable small left apical pneumothorax remains unchanged. Patchy left lung density, which may reflect atelectasis/contusion. Stable cardiomediastinal contours with evidence of aortic valve replacement. IMPRESSION: Probable small left apical pneumothorax remains  unchanged. Patchy left lung density probably reflecting atelectasis/contusion. Electronically Signed   By: Macy Mis M.D.   On: 12/17/2019 16:59   DG Chest Portable 1 View  Result Date: 12/17/2019 CLINICAL DATA:  Fall.  Confusion.  Chest pain. EXAM: PORTABLE CHEST 1 VIEW COMPARISON:  09/01/2018 FINDINGS: Previous median sternotomy and aortic valve replacement. Mild cardiac enlargement. Hiatal hernia is noted. The right chest is clear. There are fractures of the right lateral ribs, at least rib numbers 4, 5, 6, 7 and 8. There is a left pneumothorax without tension, estimated at 10%. IMPRESSION: Left-sided rib fractures 4 through 8. Left pneumothorax without tension, estimated at 10%. Previous aortic valve replacement. Hiatal hernia. Electronically Signed   By: Nelson Chimes M.D.   On: 12/17/2019 11:13   ECHOCARDIOGRAM COMPLETE  Result Date: 12/18/2019   ECHOCARDIOGRAM REPORT   Patient Name:   ZACHRY DONISI Date of Exam: 12/18/2019 Medical Rec #:  [id number]         Height:       72.0 in Accession #:    [id number]        Weight:       162.9 lb Date of Birth:  May 09, 1941         BSA:          1.95 m Patient Age:    67 years          BP:           115/69 mmHg Patient Gender: M                 HR:           72 bpm. Exam Location:  Inpatient Procedure: 2D Echo Indications:    aortic stenosis  History:        Patient has prior history of Echocardiogram examinations, most                 recent 04/16/2018. COPD; Risk Factors:Hypertension, Dyslipidemia,                 Diabetes and Former Smoker. Aortic Valve: A 55mm Magna                 bioprosthetic aortic valve Procedure Date: 10/15/2011 TAVR.                 NSTEMI.  Sonographer:    Jannett Celestine RDCS (AE) Referring Phys: [place]  Sonographer Comments: No parasternal window and no subcostal window. IMPRESSIONS  1. Technically limited study. No parasternal window and no subcostal window.  2. Left ventricular ejection fraction, by visual  estimation, is 55 to 60%. The left ventricle has normal function. There is no left ventricular hypertrophy.  3. Probable mild hypokinesis of the left ventricular, basal inferoseptal wall  and inferior wall.  4. Left ventricular diastolic parameters are consistent with Grade II diastolic dysfunction (pseudonormalization).  5. Elevated left atrial pressure.  6. Global right ventricle has normal systolic function.The right ventricular size is not well visualized. Right vetricular wall thickness was not assessed.  7. Left atrial size was mildly dilated.  8. Right atrial size was not well visualized.  9. Moderate mitral annular calcification. 10. The mitral valve is degenerative. No evidence of mitral valve regurgitation. No evidence of mitral stenosis. 11. The tricuspid valve is not well visualized. 12. Bioprosthetic aortic valve was poorly seen and gradients could not be accurately assessed. Aortic valve regurgitation is not visualized. 13. The pulmonic valve was not well visualized. Pulmonic valve regurgitation is not visualized. 14. TR signal is inadequate for assessing pulmonary artery systolic pressure. 15. The interatrial septum was not assessed. 16. The aortic root was not well visualized. FINDINGS  Left Ventricle: Left ventricular ejection fraction, by visual estimation, is 55 to 60%. The left ventricle has normal function. Mild hypokinesis of the left ventricular, basal inferoseptal wall and inferior wall. The left ventricle demonstrates regional  wall motion abnormalities. The left ventricular internal cavity size was the left ventricle is normal in size. There is no left ventricular hypertrophy. Left ventricular diastolic parameters are consistent with Grade II diastolic dysfunction (pseudonormalization). Elevated left atrial pressure. Right Ventricle: The right ventricular size is not well visualized. Right vetricular wall thickness was not assessed. Global RV systolic function is has normal systolic  function. Left Atrium: Left atrial size was mildly dilated. Right Atrium: Right atrial size was not well visualized Pericardium: There is no evidence of pericardial effusion. Mitral Valve: The mitral valve is degenerative in appearance. Moderate mitral annular calcification. No evidence of mitral valve regurgitation. No evidence of mitral valve stenosis by observation. Tricuspid Valve: The tricuspid valve is not well visualized. Tricuspid valve regurgitation is not demonstrated. Aortic Valve: The aortic valve was not well visualized. Aortic valve regurgitation is not visualized. 1mm Magna bioprosthetic aortic valve valve is present in the aortic position. Procedure Date: 10/15/2011. Bioprosthetic aortic valve was poorly seen and gradients could not be accurately assessed. Pulmonic Valve: The pulmonic valve was not well visualized. Pulmonic valve regurgitation is not visualized. Pulmonic regurgitation is not visualized. Aorta: The aortic root was not well visualized. IAS/Shunts: The interatrial septum was not assessed.   Diastology LV e' lateral:   12.18 cm/s LV E/e' lateral: 10.2 LV e' medial:    5.55 cm/s LV E/e' medial:  22.3  LEFT ATRIUM             Index LA Vol (A2C):   42.7 ml 21.87 ml/m LA Vol (A4C):   69.6 ml 35.64 ml/m LA Biplane Vol: 55.8 ml 28.58 ml/m  AORTIC VALVE LVOT Vmax:   79.30 cm/s LVOT Vmean:  56.300 cm/s LVOT VTI:    0.158 m MITRAL VALVE MV Area (PHT): 2.29 cm             SHUNTS MV PHT:        95.99 msec           Systemic VTI: 0.16 m MV Decel Time: 331 msec MV E velocity: 124.00 cm/s 103 cm/s  Dani Gobble Croitoru MD Electronically signed by Sanda Klein MD Signature Date/Time: 12/18/2019/1:21:07 PM    Final    DG Hip [place] W or Wo Pelvis 1 View Left  Result Date: 12/17/2019 CLINICAL DATA:  Left hip pain after fall. EXAM: DG HIP (WITH OR WITHOUT  PELVIS) 1V PORT LEFT COMPARISON:  Apr 18, 2018. FINDINGS: Status post surgical fusion of the left hip joint. Status post surgical internal  fixation of proximal left femoral fracture. Status post right total hip arthroplasty. No definite acute fracture or dislocation is noted. IMPRESSION: Postsurgical changes as described above. No definite acute abnormality is noted. Electronically Signed   By: Marijo Conception M.D.   On: 12/17/2019 13:54    Cardiac Studies   Echo IMPRESSIONS    1. Technically limited study. No parasternal window and no subcostal window.  2. Left ventricular ejection fraction, by visual estimation, is 55 to 60%. The left ventricle has normal function. There is no left ventricular hypertrophy.  3. Probable mild hypokinesis of the left ventricular, basal inferoseptal wall and inferior wall.  4. Left ventricular diastolic parameters are consistent with Grade II diastolic dysfunction (pseudonormalization).  5. Elevated left atrial pressure.  6. Global right ventricle has normal systolic function.The right ventricular size is not well visualized. Right vetricular wall thickness was not assessed.  7. Left atrial size was mildly dilated.  8. Right atrial size was not well visualized.  9. Moderate mitral annular calcification. 10. The mitral valve is degenerative. No evidence of mitral valve regurgitation. No evidence of mitral stenosis. 11. The tricuspid valve is not well visualized. 12. Bioprosthetic aortic valve was poorly seen and gradients could not be accurately assessed. Aortic valve regurgitation is not visualized. 13. The pulmonic valve was not well visualized. Pulmonic valve regurgitation is not visualized. 14. TR signal is inadequate for assessing pulmonary artery systolic pressure. 15. The interatrial septum was not assessed. 16. The aortic root was not well visualized.  Patient Profile     79 y.o. 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 Lorin Mercy.   Assessment & Plan    1. Atrial fibrillation with RVR. Now  converted to NSR. In NSR on monitor with PACs. Probably triggered by fall with multiple rib fractures and small pneumothorax. TSH normal. Marked hypokalemia and hypocalcemia noted. Echo looked ok.  Now on po cardizem. 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 2. Fall with multiple rib fractures and small pneumothorax 3. AS s/p bioprosthetic AVR 4. DM hypoglycemic on arrival. 5. Hypocalcemia. Management per primary team 6. Hypokalemia- replete 7. COPD.  8. Altered mental status. Suspect some baseline dementia exacerbated by metabolic derangements and recent fall.     [facility] will sign off.   Medication Recommendations:  As per [facility] Other recommendations (labs, testing, etc):  none Follow up as an outpatient:  Follow up PRN   For questions or updates, please contact [facility] Please consult [email or web address] for contact info under        Signed, Johnda Billiot [place], MD  12/19/2019, 8:43 AM

## 2019-12-19 NOTE — Progress Notes (Signed)
CRITICAL VALUE ALERT  Critical Value:  Calcium-6.4  Date & Time Notied:  12/19/19 0640  Provider Notified: M. Sharlet Salina  Orders Received/Actions taken: Awaiting orders  Will continue to monitor

## 2019-12-19 NOTE — Final Consult Note (Addendum)
Consultant Final Sign-Off Note    Assessment/Final recommendations  Casey Gilmore is a 79 y.o. male followed by me for left rib fractures with left pneumothorax. Patient had 2 follow up CXR, both showing stable left pneumothorax. Oxygen requirement decreasing and patient using IS and flutter valve. Pain control is adequate. PT recommending SNF placement, OT still to see patient. Recommendations for left rib fractures with stable small left pneumothorax as below. Trauma will sign off.    Wound care (if applicable): N/A   Diet at discharge: per primary team   Activity at discharge: per primary team   Follow-up appointment: follow up with PCP upon discharge from SNF or in 1-2 weeks if patient refuses SNF   Pending results:  Unresulted Labs (From admission, onward)    Start     Ordered   12/19/19 0500  Parathyroid hormone, intact (no Ca)  Tomorrow morning,   R     12/18/19 1450           Medication recommendations: Continue scheduled tylenol and robaxin with prn oxycodone for pain control. Continue nebulizer treatments as needed given baseline COPD.   Other recommendations: Continue IS and pulmonary toilet, PT recommending SNF placement for discharge    Thank you for allowing [place] to participate in the care of your patient!  Please consult [place] again if you have further needs for your patient.  Claiborne Billings Rayburn 12/19/2019 8:22 AM    Subjective   Patient reports pain in L ribs is overall well controlled. He is currently on 2L supplemental oxygen and appears to be breathing comfortably. He denies SOB. Using flutter valve and IS, able to pull IS to 500. He reports PTA he was actually living at home with family.   Objective  Vital signs in last 24 hours: Temp:  [98.1 F (36.7 C)-99.4 F (37.4 C)] 98.1 F (36.7 C) (01/13 0426) Pulse Rate:  [68-86] 73 (01/13 0426) Resp:  [16-27] 17 (01/13 0426) BP: (106-144)/(62-91) 125/79 (01/13 0426) SpO2:  [90 %-100 %] 97 % (01/13  0426) Weight:  [74.8 kg-75.8 kg] 74.8 kg (01/13 0144)  PE: Gen: NAD, laying in bed Heart: RRR, no m/g/r, pedal pulses 2+ BL Lungs: normal effort, CTAB, pulled 750 on IS, some left-sided chest wall soreness as expected  Abd: soft, NT, ND, +BS Skin: no rashes   Pertinent labs and Studies: Recent Labs    12/17/19 0955 12/18/19 0510  WBC 16.2* 12.7*  HGB 11.1* 9.5*  HCT 37.4* 31.2*   BMET Recent Labs    12/18/19 1244 12/19/19 0439  NA 140 143  K 3.6 4.3  CL 99 103  CO2 27 28  GLUCOSE 195* 167*  BUN 24* 26*  CREATININE 1.26* 1.22  CALCIUM 6.3* 6.4*   No results for input(s): LABURIN in the last 72 hours. Results for orders placed or performed during the hospital encounter of 12/17/19  Urine culture     Status: Abnormal   Collection Time: 12/17/19  9:55 AM   Specimen: Urine, Random  Result Value Ref Range Status   Specimen Description URINE, RANDOM  Final   Special Requests NONE  Final   Culture (A)  Final    <10,000 COLONIES/mL INSIGNIFICANT GROWTH Performed at [facility], [address], [place], [place] [address]    Report Status 12/18/2019 FINAL  Final  SARS CORONAVIRUS 2 (TAT 6-24 HRS) Nasopharyngeal Nasopharyngeal Swab     Status: None   Collection Time: 12/17/19  2:30 PM   Specimen: Nasopharyngeal  Swab  Result Value Ref Range Status   SARS Coronavirus 2 NEGATIVE NEGATIVE Final    Comment: (NOTE) SARS-CoV-2 target nucleic acids are NOT DETECTED. The SARS-CoV-2 RNA is generally detectable in upper and lower respiratory specimens during the acute phase of infection. Negative results do not preclude SARS-CoV-2 infection, do not rule out co-infections with other pathogens, and should not be used as the sole basis for treatment or other patient management decisions. Negative results must be combined with clinical observations, patient history, and epidemiological information. The expected result is Negative. Fact Sheet for  Patients: SugarRoll.be Fact Sheet for Healthcare Providers: https://www.woods-mathews.com/ This test is not yet approved or cleared by the [place] FDA and  has been authorized for detection and/or diagnosis of SARS-CoV-2 by FDA under an Emergency Use Authorization (EUA). This EUA will remain  in effect (meaning this test can be used) for the duration of the COVID-19 declaration under Section 56 4(b)(1) of the Act, 21 U.S.C. section 360bbb-3(b)(1), unless the authorization is terminated or revoked sooner. Performed at [facility], [place] [address] [place], [place] [address]     Imaging: ECHOCARDIOGRAM COMPLETE  Result Date: 12/18/2019   ECHOCARDIOGRAM REPORT   Patient Name:   Casey Gilmore Date of Exam: 12/18/2019 Medical Rec #:  [id number]         Height:       72.0 in Accession #:    [id number]        Weight:       162.9 lb Date of Birth:  08-10-1941         BSA:          1.95 m Patient Age:    32 years          BP:           115/69 mmHg Patient Gender: M                 HR:           72 bpm. Exam Location:  Inpatient Procedure: 2D Echo Indications:    aortic stenosis  History:        Patient has prior history of Echocardiogram examinations, most                 recent 04/16/2018. COPD; Risk Factors:Hypertension, Dyslipidemia,                 Diabetes and Former Smoker. Aortic Valve: A 42mm Magna                 bioprosthetic aortic valve Procedure Date: 10/15/2011 TAVR.                 NSTEMI.  Sonographer:    Jannett Celestine RDCS (AE) Referring Phys: [place]  Sonographer Comments: No parasternal window and no subcostal window. IMPRESSIONS  1. Technically limited study. No parasternal window and no subcostal window.  2. Left ventricular ejection fraction, by visual estimation, is 55 to 60%. The left ventricle has normal function. There is no left ventricular hypertrophy.  3. Probable mild hypokinesis of the left ventricular,  basal inferoseptal wall and inferior wall.  4. Left ventricular diastolic parameters are consistent with Grade II diastolic dysfunction (pseudonormalization).  5. Elevated left atrial pressure.  6. Global right ventricle has normal systolic function.The right ventricular size is not well visualized. Right vetricular wall thickness was not assessed.  7. Left atrial size was mildly dilated.  8. Right atrial size  was not well visualized.  9. Moderate mitral annular calcification. 10. The mitral valve is degenerative. No evidence of mitral valve regurgitation. No evidence of mitral stenosis. 11. The tricuspid valve is not well visualized. 12. Bioprosthetic aortic valve was poorly seen and gradients could not be accurately assessed. Aortic valve regurgitation is not visualized. 13. The pulmonic valve was not well visualized. Pulmonic valve regurgitation is not visualized. 14. TR signal is inadequate for assessing pulmonary artery systolic pressure. 15. The interatrial septum was not assessed. 16. The aortic root was not well visualized. FINDINGS  Left Ventricle: Left ventricular ejection fraction, by visual estimation, is 55 to 60%. The left ventricle has normal function. Mild hypokinesis of the left ventricular, basal inferoseptal wall and inferior wall. The left ventricle demonstrates regional  wall motion abnormalities. The left ventricular internal cavity size was the left ventricle is normal in size. There is no left ventricular hypertrophy. Left ventricular diastolic parameters are consistent with Grade II diastolic dysfunction (pseudonormalization). Elevated left atrial pressure. Right Ventricle: The right ventricular size is not well visualized. Right vetricular wall thickness was not assessed. Global RV systolic function is has normal systolic function. Left Atrium: Left atrial size was mildly dilated. Right Atrium: Right atrial size was not well visualized Pericardium: There is no evidence of pericardial  effusion. Mitral Valve: The mitral valve is degenerative in appearance. Moderate mitral annular calcification. No evidence of mitral valve regurgitation. No evidence of mitral valve stenosis by observation. Tricuspid Valve: The tricuspid valve is not well visualized. Tricuspid valve regurgitation is not demonstrated. Aortic Valve: The aortic valve was not well visualized. Aortic valve regurgitation is not visualized. 25mm Magna bioprosthetic aortic valve valve is present in the aortic position. Procedure Date: 10/15/2011. Bioprosthetic aortic valve was poorly seen and gradients could not be accurately assessed. Pulmonic Valve: The pulmonic valve was not well visualized. Pulmonic valve regurgitation is not visualized. Pulmonic regurgitation is not visualized. Aorta: The aortic root was not well visualized. IAS/Shunts: The interatrial septum was not assessed.   Diastology LV e' lateral:   12.18 cm/s LV E/e' lateral: 10.2 LV e' medial:    5.55 cm/s LV E/e' medial:  22.3  LEFT ATRIUM             Index LA Vol (A2C):   42.7 ml 21.87 ml/m LA Vol (A4C):   69.6 ml 35.64 ml/m LA Biplane Vol: 55.8 ml 28.58 ml/m  AORTIC VALVE LVOT Vmax:   79.30 cm/s LVOT Vmean:  56.300 cm/s LVOT VTI:    0.158 m MITRAL VALVE MV Area (PHT): 2.29 cm             SHUNTS MV PHT:        95.99 msec           Systemic VTI: 0.16 m MV Decel Time: 331 msec MV E velocity: 124.00 cm/s 103 cm/s  Dani Gobble Croitoru MD Electronically signed by Sanda Klein MD Signature Date/Time: 12/18/2019/1:21:07 PM    Final

## 2019-12-20 LAB — CBC WITH DIFFERENTIAL/PLATELET
Abs Immature Granulocytes: 0.03 10*3/uL (ref 0.00–0.07)
Basophils Absolute: 0 10*3/uL (ref 0.0–0.1)
Basophils Relative: 0 %
Eosinophils Absolute: 0 10*3/uL (ref 0.0–0.5)
Eosinophils Relative: 0 %
HCT: 32 % — ABNORMAL LOW (ref 39.0–52.0)
Hemoglobin: 9.8 g/dL — ABNORMAL LOW (ref 13.0–17.0)
Immature Granulocytes: 0 %
Lymphocytes Relative: 12 %
Lymphs Abs: 1.5 10*3/uL (ref 0.7–4.0)
MCH: 22.8 pg — ABNORMAL LOW (ref 26.0–34.0)
MCHC: 30.6 g/dL (ref 30.0–36.0)
MCV: 74.6 fL — ABNORMAL LOW (ref 80.0–100.0)
Monocytes Absolute: 0.8 10*3/uL (ref 0.1–1.0)
Monocytes Relative: 6 %
Neutro Abs: 10.2 10*3/uL — ABNORMAL HIGH (ref 1.7–7.7)
Neutrophils Relative %: 82 %
Platelets: 364 10*3/uL (ref 150–400)
RBC: 4.29 MIL/uL (ref 4.22–5.81)
RDW: 19.4 % — ABNORMAL HIGH (ref 11.5–15.5)
WBC: 12.6 10*3/uL — ABNORMAL HIGH (ref 4.0–10.5)
nRBC: 0 % (ref 0.0–0.2)

## 2019-12-20 LAB — RENAL FUNCTION PANEL
Albumin: 2.3 g/dL — ABNORMAL LOW (ref 3.5–5.0)
Anion gap: 13 (ref 5–15)
BUN: 29 mg/dL — ABNORMAL HIGH (ref 8–23)
CO2: 28 mmol/L (ref 22–32)
Calcium: 6.8 mg/dL — ABNORMAL LOW (ref 8.9–10.3)
Chloride: 99 mmol/L (ref 98–111)
Creatinine, Ser: 1.21 mg/dL (ref 0.61–1.24)
GFR calc Af Amer: >60 mL/min (ref >60)
GFR calc non Af Amer: 57 mL/min — ABNORMAL LOW (ref >60)
Glucose, Bld: 222 mg/dL — ABNORMAL HIGH (ref 70–99)
Phosphorus: 3.2 mg/dL (ref 2.5–4.6)
Potassium: 4.3 mmol/L (ref 3.5–5.1)
Sodium: 140 mmol/L (ref 135–145)

## 2019-12-20 LAB — GLUCOSE, CAPILLARY
Glucose-Capillary: 145 mg/dL — ABNORMAL HIGH (ref 70–99)
Glucose-Capillary: 149 mg/dL — ABNORMAL HIGH (ref 70–99)
Glucose-Capillary: 153 mg/dL — ABNORMAL HIGH (ref 70–99)
Glucose-Capillary: 154 mg/dL — ABNORMAL HIGH (ref 70–99)
Glucose-Capillary: 180 mg/dL — ABNORMAL HIGH (ref 70–99)
Glucose-Capillary: 95 mg/dL (ref 70–99)

## 2019-12-20 LAB — URIC ACID: Uric Acid, Serum: 5.8 mg/dL (ref 3.7–8.6)

## 2019-12-20 LAB — PARATHYROID HORMONE, INTACT (NO CA): PTH: 23 pg/mL (ref 15–65)

## 2019-12-20 MED ORDER — METHYLPREDNISOLONE ACETATE 80 MG/ML IJ SUSP
80.0000 mg | Freq: Once | INTRAMUSCULAR | Status: AC
  Administered 2019-12-20: 80 mg via INTRA_ARTICULAR
  Filled 2019-12-20: qty 1

## 2019-12-20 MED ORDER — BUPIVACAINE HCL (PF) 0.5 % IJ SOLN
10.0000 mL | Freq: Once | INTRAMUSCULAR | Status: AC
  Administered 2019-12-20: 10 mL
  Filled 2019-12-20: qty 10

## 2019-12-20 NOTE — Plan of Care (Signed)

## 2019-12-20 NOTE — Consult Note (Signed)
Reason for Consult:Left knee pain Referring Physician: Rana Barrus is an 79 y.o. male.  HPI: Casey Gilmore has been c/o left knee pain since admission for rib fxs after a fall. He tells me it's been going on for years and is no worse now. X-rays showed an effusion and orthopedic surgery was consulted. He says he's seen someone about the pain but can't remember who or what was diagnosed or done. He doesn't remember who did his TKA but it was her in town but prior to EMR implementation.  Past Medical History:  Diagnosis Date  . Anemia    2012  . Arthritis   . Blood transfusion   . 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 Closed fracture of left distal radius and ulna 01/19/2018  . Closed fracture of left proximal humerus 01/19/2018  . COPD (chronic obstructive pulmonary disease) ([place])   . Diabetes mellitus   . GERD (gastroesophageal reflux disease)   . Heart murmur   . Hiatal hernia   . Hyperlipidemia   . Hypertension   . NSTEMI (non-ST elevated myocardial infarction) ([place]) 07/2011   in setting of Pneumonia with normal coronary arteries on cath  . Pneumonia hx 8/12  . Severe aortic stenosis 2012   s/p bioprosthetic AVR  . Shortness of breath    with exertion  . Upper GI bleeding     Past Surgical History:  Procedure Laterality Date  . AORTIC VALVE REPLACEMENT  10/15/2011   Procedure: AORTIC VALVE REPLACEMENT (AVR);  Surgeon: Tharon Aquas Adelene Idler, MD;  Location: [facility];  Service: Open Heart Surgery;  Laterality: N/A;  . CARDIAC CATHETERIZATION  results on chart   2012 w no significant disease,echo with Severe Aortic stenosis,LVH,Diastolic dysfunction.   . COLONOSCOPY  12/2004  . EYE SURGERY  bil cat 08  . FRACTURE SURGERY  pin in lft hip  . JOINT REPLACEMENT  left knee 08, rt hip 08  . LARYNGOSCOPY  01/05/2013   Procedure: LARYNGOSCOPY;  Surgeon: Melida Quitter, MD;  Location: [facility];  Service: ENT;  Laterality: N/A;  micro direct laryngoscopy with biopsy, possible co2 laser, small  laser-safe endotracheal tube  . LARYNGOSCOPY N/A 02/08/2013   Procedure: LARYNGOSCOPY;  Surgeon: Melida Quitter, MD;  Location: [facility];  Service: ENT;  Laterality: N/A;  Micro Direct Laryngoscopy with Co2 laser with re-excision right vocal cord lesion.  . OPEN REDUCTION INTERNAL FIXATION (ORIF) DISTAL RADIAL FRACTURE Left 01/19/2018   Procedure: OPEN REDUCTION INTERNAL FIXATION (ORIF) DISTAL RADIAL FRACTURE;  Surgeon: Marchia Bond, MD;  Location: [facility];  Service: Orthopedics;  Laterality: Left;  . ORIF FEMUR FRACTURE Left 04/18/2018   Procedure: OPEN REDUCTION INTERNAL FIXATION PROXIMAL FEMORAL SHAFT FRACTURE...REMOVAL OF HARDWARE LEFT FEMUR (HIP FUSION PLATE);  Surgeon: Altamese , MD;  Location: [facility];  Service: Orthopedics;  Laterality: Left;  . ORIF HUMERUS FRACTURE Left 01/19/2018   Procedure: OPEN REDUCTION INTERNAL FIXATION (ORIF) PROXIMAL HUMERUS FRACTURE;  Surgeon: Marchia Bond, MD;  Location: [facility];  Service: Orthopedics;  Laterality: Left;  . TONSILLECTOMY    . TOTAL HIP ARTHROPLASTY Right   . TOTAL KNEE ARTHROPLASTY Left   . 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 Hypertension Father     Social History:  reports that he quit smoking about 41 years ago. His smoking use included cigarettes. He has a 50.00 pack-year smoking history. He  has never used smokeless tobacco. He reports that he does not drink alcohol or use drugs.  Allergies: No Known Allergies  Medications: I have reviewed the patient's current medications.  Results for orders placed or performed during the hospital encounter of 12/17/19 (from the past 48 hour(s))  Glucose, capillary     Status: Abnormal   Collection Time: 12/18/19  5:13 PM  Result Value Ref Range   Glucose-Capillary 188 (H) 70 - 99 mg/dL  Glucose, capillary     Status: Abnormal   Collection Time: 12/18/19  9:44 PM  Result Value Ref Range    Glucose-Capillary 164 (H) 70 - 99 mg/dL  Glucose, capillary     Status: Abnormal   Collection Time: 12/18/19 10:01 PM  Result Value Ref Range   Glucose-Capillary 154 (H) 70 - 99 mg/dL  Glucose, capillary     Status: Abnormal   Collection Time: 12/19/19  1:14 AM  Result Value Ref Range   Glucose-Capillary 149 (H) 70 - 99 mg/dL  Glucose, capillary     Status: Abnormal   Collection Time: 12/19/19  3:58 AM  Result Value Ref Range   Glucose-Capillary 146 (H) 70 - 99 mg/dL  Renal function panel     Status: Abnormal   Collection Time: 12/19/19  4:39 AM  Result Value Ref Range   Sodium 143 135 - 145 mmol/L   Potassium 4.3 3.5 - 5.1 mmol/L   Chloride 103 98 - 111 mmol/L   CO2 28 22 - 32 mmol/L   Glucose, Bld 167 (H) 70 - 99 mg/dL   BUN 26 (H) 8 - 23 mg/dL   Creatinine, Ser 1.22 0.61 - 1.24 mg/dL   Calcium 6.4 (LL) 8.9 - 10.3 mg/dL    Comment: CRITICAL RESULT CALLED TO, READ BACK BY AND VERIFIED WITH: OZA J,RN 12/19/19 0603 WAYK    Phosphorus 3.1 2.5 - 4.6 mg/dL   Albumin 2.4 (L) 3.5 - 5.0 g/dL   GFR calc non Af Amer 56 (L) >60 mL/min   GFR calc Af Amer >60 >60 mL/min   Anion gap 12 5 - 15    Comment: Performed at [facility], [address], [place], [place] [address]  Parathyroid hormone, intact (no Ca)     Status: None   Collection Time: 12/19/19  4:39 AM  Result Value Ref Range   PTH 23 15 - 65 pg/mL    Comment: (NOTE) Performed At: [facility] [place], [place] [id number] Rush Farmer MD [id number]   Glucose, capillary     Status: Abnormal   Collection Time: 12/19/19  8:04 AM  Result Value Ref Range   Glucose-Capillary 154 (H) 70 - 99 mg/dL  Glucose, capillary     Status: Abnormal   Collection Time: 12/19/19 11:26 AM  Result Value Ref Range   Glucose-Capillary 178 (H) 70 - 99 mg/dL  Glucose, capillary     Status: Abnormal   Collection Time: 12/19/19  4:00 PM  Result Value Ref Range   Glucose-Capillary 190 (H) 70 - 99 mg/dL   Glucose, capillary     Status: Abnormal   Collection Time: 12/19/19  9:01 PM  Result Value Ref Range   Glucose-Capillary 157 (H) 70 - 99 mg/dL  Glucose, capillary     Status: Abnormal   Collection Time: 12/20/19 12:14 AM  Result Value Ref Range   Glucose-Capillary 145 (H) 70 - 99 mg/dL   Comment 1 Notify RN    Comment 2 Document in Chart  Glucose, capillary     Status: Abnormal   Collection Time: 12/20/19  4:29 AM  Result Value Ref Range   Glucose-Capillary 154 (H) 70 - 99 mg/dL  Glucose, capillary     Status: Abnormal   Collection Time: 12/20/19  7:55 AM  Result Value Ref Range   Glucose-Capillary 153 (H) 70 - 99 mg/dL  Glucose, capillary     Status: Abnormal   Collection Time: 12/20/19 11:37 AM  Result Value Ref Range   Glucose-Capillary 149 (H) 70 - 99 mg/dL    DG Knee 1-2 Views Left  Result Date: 12/19/2019 CLINICAL DATA:  Left knee pain towards the apex of the patella EXAM: LEFT KNEE - 1-2 VIEW COMPARISON:  Radiographs Apr 17, 2018 FINDINGS: Redemonstration of a total left knee arthroplasty with posterior patellar resurfacing. No periprosthetic fracture evidence of subsidence or other acute complication is seen. There is a moderate knee effusion. Extensive vascular calcium is noted posterior to the knee. Question a remote posttraumatic deformity of the proximal fibula. Appearance is unchanged from comparison. IMPRESSION: 1. Stable appearance of total left knee arthroplasty without evidence of acute complication. 2. Moderate knee effusion. No acute fracture or traumatic malalignment. 3. Extensive vascular calcium posterior to the knee. Electronically Signed   By: Lovena Le M.D.   On: 12/19/2019 20:39    Review of Systems  HENT: Negative for ear discharge, ear pain, hearing loss and tinnitus.   Eyes: Negative for photophobia and pain.  Respiratory: Negative for cough and shortness of breath.   Cardiovascular: Negative for chest pain.  Gastrointestinal: Negative for  abdominal pain, nausea and vomiting.  Genitourinary: Negative for dysuria, flank pain, frequency and urgency.  Musculoskeletal: Positive for arthralgias (Left knee). Negative for back pain, myalgias and neck pain.  Neurological: Negative for dizziness and headaches.  Hematological: Does not bruise/bleed easily.  Psychiatric/Behavioral: The patient is not nervous/anxious.    Blood pressure 140/70, pulse 85, temperature 98.4 F (36.9 C), temperature source Oral, resp. rate 18, height 6' (1.829 m), weight 78.5 kg, SpO2 94 %. Physical Exam  Constitutional: He appears well-developed and well-nourished. No distress.  HENT:  Head: Normocephalic and atraumatic.  Eyes: Conjunctivae are normal. Right eye exhibits no discharge. Left eye exhibits no discharge. No scleral icterus.  Cardiovascular: Normal rate and regular rhythm.  Respiratory: Effort normal. No respiratory distress.  Musculoskeletal:     Cervical back: Normal range of motion.     Comments: LLE No traumatic wounds, ecchymosis, or rash  Knee severe TTP, even to light touch, esp lat/med. Pain with motion >20 degrees, no erythema  No ankle effusion, mild if any knee effusion  Knee stable to varus/ valgus and anterior/posterior stress but painful  Sens DPN, SPN, TN intact  Motor EHL, ext, flex, evers 5/5  DP 1+, PT 1+, No significant edema  Neurological: He is alert.  Skin: Skin is warm and dry. He is not diaphoretic.  Psychiatric: He has a normal mood and affect. His behavior is normal.    Assessment/Plan: Left knee pain -- Attempt at arthrocentesis unsuccessful. Will check uric acid level. Not concerned about septic joint. Will probably need to f/u as OP with Dr. Griffin Basil or already established orthopedic practice. Multiple medical problems including UGI bleeding; s/p bioprosthetic AVR; afib; CAD; HTN; HLD; DM; COPD; and vocal cord CA -- per primary service    Lisette Abu, PA-C Orthopedic Surgery [phone] 12/20/2019,  2:50 PM

## 2019-12-20 NOTE — Progress Notes (Signed)
PROGRESS NOTE    Casey Gilmore  [id number] DOB: 07/30/1941 DOA: 12/17/2019 PCP: Mayra Neer, MD   Brief Narrative:  Casey Gilmore is a 79 y.o. male with medical history significant of UGI bleeding; s/p bioprosthetic AVR; afib; CAD; HTN; HLD; DM; COPD; and vocal cord CA presenting with fall.  He was found to have hypoglycemia with multiple rib fractures and a small pneumothorax.  Surgery is following and recommending conservative management.  Subjective: Patient was complaining of left knee pain.  He had a left total knee replacement many years ago, do not remember who did it but said that it was [place].  Assessment & Plan:   Principal Problem:   Fall Active Problems:   Diabetes mellitus ([place])   Chronic diastolic heart failure (HCC)   Benign essential HTN   COPD (chronic obstructive pulmonary disease) with chronic bronchitis (HCC)   Chronic pain syndrome   Multiple closed fractures of ribs of left side   Pneumothorax, closed, traumatic, initial encounter   Atrial fibrillation with RVR ([place])  Fall with rib fractures and PTX Patient with fall at home on 12/16/2019, suffering from multiple rib fractures and traumatic PTX. Chest x-ray with stable pneumothorax.  Surgery is recommending conservative management. -Continue with incentive spirometry and flutter valve. -Continue with pain management. -Add lidocaine patch for rib pain. -PT/OT -recommending SNF placement.  Social worker working on placement.  Left knee pain.  Patient had an left knee replacement done many years ago.  Complaining of worsening left knee pain.  Left knee x-ray obtained which shows effusion and intact hardware.  Patient remained afebrile.  There was no visible edema or erythema on exam.  Tender to touch. -Consult orthopedic.  Acute on chronic hypoxic respiratory failure Patient has underlying COPD but is not on home O2.  Patient was saturating well on room air this morning.  Fib  with RVR.  Initially managed with Cardizem infusion.  Now back in sinus rhythm.  TSH within normal limit.  Most likely triggered with injury. Cardiology is following-appreciate the recommendations. -IV Cardizem with p.o. -Not a candidate for anticoagulation because of falls and history of GI bleed. -Echocardiogram with normal ejection fraction and grade 2 diastolic dysfunction. -Cardiology signed off today.  Diastolic CHF.  Does not appear volume overload.  History of bioprosthetic valve. -Cardiology ordered repeat echo-no significant change, grade 2 diastolic dysfunction with normal ejection fraction.  Hypocalcemia/hypokalemia.  No labs this morning.  Corrected calcium of 7.7 and potassium of 4.3 yesterday. -Reordered labs. -Parathyroid within normal limit of 23. -Continue with calcium supplement.  Diabetes.  Initially hypoglycemic, now CBG mildly elevated.   A1c 5.8-high risk for hypoglycemia.  -Continue to monitor. -SSI.  Chronic pain.  On Vicodin and Robaxin at home. [place] controlled substance reporting system was appropriate. -Being managed with oxycodone and Robaxin currently. -Add K pad and lidocaine patch.  Objective: Vitals:   12/20/19 0035 12/20/19 0219 12/20/19 0424 12/20/19 0819  BP:   (!) 146/98 (!) 149/72  Pulse: (!) 111 (!) 110 (!) 108 100  Resp:   20 19  Temp:   98.6 F (37 C) 99.2 F (37.3 C)  TempSrc:   Oral Oral  SpO2:   90% 92%  Weight:   78.5 kg   Height:        Intake/Output Summary (Last 24 hours) at 12/20/2019 1239 Last data filed at 12/20/2019 0900 Gross per 24 hour  Intake 650 ml  Output 450 ml  Net 200 ml  Filed Weights   12/18/19 1417 12/19/19 0144 12/20/19 0424  Weight: 75.8 kg 74.8 kg 78.5 kg    Examination:  General exam: Appears calm and comfortable  Respiratory system: Clear to auscultation. Respiratory effort normal. Cardiovascular system: S1 & S2 heard, RRR. No JVD, murmurs, rubs, gallops or clicks. Gastrointestinal system:  Soft, nontender, nondistended, bowel sounds positive. Central nervous system: Alert and oriented. No focal neurological deficits. Extremities: No edema, no cyanosis, pulses intact and symmetrical. Skin: Multiple ecchymosis and a small clean laceration over right eye brow. Psychiatry: Judgement and insight appear normal. Mood & affect appropriate.    DVT prophylaxis: SCDs Code Status: DNR Family Communication: No family at bedside. Disposition Plan: SNF placement once bed is available.  Not a safe discharge to home.  Consultants:   Surgery  Cardiology  Orthopedic  Procedures:  Antimicrobials:   Data Reviewed: I have personally reviewed following labs and imaging studies  CBC: Recent Labs  Lab 12/17/19 0955 12/18/19 0510  WBC 16.2* 12.7*  NEUTROABS 14.6*  --   HGB 11.1* 9.5*  HCT 37.4* 31.2*  MCV 77.1* 75.4*  PLT 420* 123456   Basic Metabolic Panel: Recent Labs  Lab 12/17/19 0955 12/18/19 0510 12/18/19 1244 12/19/19 0439  NA 143 142 140 143  K 3.4* 3.0* 3.6 4.3  CL 100 101 99 103  CO2 26 28 27 28   GLUCOSE 53* 96 195* 167*  BUN 22 22 24* 26*  CREATININE 1.68* 1.23 1.26* 1.22  CALCIUM 6.5* 6.1* 6.3* 6.4*  PHOS  --   --  3.9 3.1   GFR: Estimated Creatinine Clearance: 54.8 mL/min (by C-G formula based on SCr of 1.22 mg/dL). Liver Function Tests: Recent Labs  Lab 12/18/19 0510 12/18/19 1244 12/19/19 0439  AST 16  --   --   ALT 8  --   --   ALKPHOS 71  --   --   BILITOT 1.1  --   --   PROT 5.3*  --   --   ALBUMIN 2.5* 2.5* 2.4*   No results for input(s): LIPASE, AMYLASE in the last 168 hours. No results for input(s): AMMONIA in the last 168 hours. Coagulation Profile: No results for input(s): INR, PROTIME in the last 168 hours. Cardiac Enzymes: No results for input(s): CKTOTAL, CKMB, CKMBINDEX, TROPONINI in the last 168 hours. BNP (last 3 results) No results for input(s): PROBNP in the last 8760 hours. HbA1C: Recent Labs    12/17/19 2000  HGBA1C  5.8*   CBG: Recent Labs  Lab 12/19/19 2101 12/20/19 0014 12/20/19 0429 12/20/19 0755 12/20/19 1137  GLUCAP 157* 145* 154* 153* 149*   Lipid Profile: No results for input(s): CHOL, HDL, LDLCALC, TRIG, CHOLHDL, LDLDIRECT in the last 72 hours. Thyroid Function Tests: Recent Labs    12/17/19 2000  TSH 2.092   Anemia Panel: No results for input(s): VITAMINB12, FOLATE, FERRITIN, TIBC, IRON, RETICCTPCT in the last 72 hours. Sepsis Labs: No results for input(s): PROCALCITON, LATICACIDVEN in the last 168 hours.  Recent Results (from the past 240 hour(s))  Urine culture     Status: Abnormal   Collection Time: 12/17/19  9:55 AM   Specimen: Urine, Random  Result Value Ref Range Status   Specimen Description URINE, RANDOM  Final   Special Requests NONE  Final   Culture (A)  Final    <10,000 COLONIES/mL INSIGNIFICANT GROWTH Performed at [facility], [address] [place], [place] [address]    Report Status 12/18/2019 FINAL  Final  SARS CORONAVIRUS 2 (TAT 6-24 HRS) Nasopharyngeal Nasopharyngeal Swab     Status: None   Collection Time: 12/17/19  2:30 PM   Specimen: Nasopharyngeal Swab  Result Value Ref Range Status   SARS Coronavirus 2 NEGATIVE NEGATIVE Final    Comment: (NOTE) SARS-CoV-2 target nucleic acids are NOT DETECTED. The SARS-CoV-2 RNA is generally detectable in upper and lower respiratory specimens during the acute phase of infection. Negative results do not preclude SARS-CoV-2 infection, do not rule out co-infections with other pathogens, and should not be used as the sole basis for treatment or other patient management decisions. Negative results must be combined with clinical observations, patient history, and epidemiological information. The expected result is Negative. Fact Sheet for Patients: SugarRoll.be Fact Sheet for Healthcare Providers: https://www.woods-mathews.com/ This test is not yet approved or cleared  by the [place] FDA and  has been authorized for detection and/or diagnosis of SARS-CoV-2 by FDA under an Emergency Use Authorization (EUA). This EUA will remain  in effect (meaning this test can be used) for the duration of the COVID-19 declaration under Section 56 4(b)(1) of the Act, 21 U.S.C. section 360bbb-3(b)(1), unless the authorization is terminated or revoked sooner. Performed at [facility], [address], [place], [place] [address]      Radiology Studies: DG Knee 1-2 Views Left  Result Date: 12/19/2019 CLINICAL DATA:  Left knee pain towards the apex of the patella EXAM: LEFT KNEE - 1-2 VIEW COMPARISON:  Radiographs Apr 17, 2018 FINDINGS: Redemonstration of a total left knee arthroplasty with posterior patellar resurfacing. No periprosthetic fracture evidence of subsidence or other acute complication is seen. There is a moderate knee effusion. Extensive vascular calcium is noted posterior to the knee. Question a remote posttraumatic deformity of the proximal fibula. Appearance is unchanged from comparison. IMPRESSION: 1. Stable appearance of total left knee arthroplasty without evidence of acute complication. 2. Moderate knee effusion. No acute fracture or traumatic malalignment. 3. Extensive vascular calcium posterior to the knee. Electronically Signed   By: Lovena Le M.D.   On: 12/19/2019 20:39    Scheduled Meds: . acetaminophen  650 mg Oral Q6H  . calcium carbonate  1 tablet Oral BID WC  . diltiazem  120 mg Oral Daily  . docusate sodium  100 mg Oral BID  . insulin aspart  0-15 Units Subcutaneous TID WC  . lidocaine  1 patch Transdermal Q24H  . lidocaine-EPINEPHrine  10 mL Intradermal Once  . methocarbamol  500 mg Oral TID  . pantoprazole  40 mg Oral Daily  . sodium chloride flush  3 mL Intravenous Q12H   Continuous Infusions:   LOS: 3 days   Time spent: 30 minutes  Lorella Nimrod, MD [facility] Pager [phone]  If 7PM-7AM, please contact  night-coverage [email or web address] Password [facility] 12/20/2019, 12:39 PM   This record has been created using Dragon voice recognition software. Errors have been sought and corrected,but may not always be located. Such creation errors do not reflect on the standard of care.

## 2019-12-20 NOTE — Procedures (Signed)
Procedure: Right knee aspiration and injection  Indication: Right knee effusion(s)  Surgeon: Silvestre Gunner, PA-C  Assist: None  Anesthesia: Topical refrigerant  EBL: None  Complications: Unsucessful   Findings: After risks/benefits explained patient desires to undergo procedure. Consent obtained and time out performed. The right knee was sterilely prepped and aspirated. 3 separate attempts were made; all unsuccessful. Pt aborted the procedure after the 3rd attempt. Pt tolerated the procedure well considering.    Lisette Abu, PA-C Orthopedic Surgery [phone]

## 2019-12-20 NOTE — Progress Notes (Signed)
Notify provider hr 111-116, v/s stable, pt rest in bed, no sign anxiety or pain. Follow up new order.

## 2019-12-21 LAB — GLUCOSE, CAPILLARY
Glucose-Capillary: 119 mg/dL — ABNORMAL HIGH (ref 70–99)
Glucose-Capillary: 123 mg/dL — ABNORMAL HIGH (ref 70–99)
Glucose-Capillary: 132 mg/dL — ABNORMAL HIGH (ref 70–99)
Glucose-Capillary: 137 mg/dL — ABNORMAL HIGH (ref 70–99)
Glucose-Capillary: 145 mg/dL — ABNORMAL HIGH (ref 70–99)
Glucose-Capillary: 162 mg/dL — ABNORMAL HIGH (ref 70–99)

## 2019-12-21 LAB — SARS CORONAVIRUS 2 (TAT 6-24 HRS): SARS Coronavirus 2: POSITIVE — AB

## 2019-12-21 NOTE — Progress Notes (Signed)
Patient refused posterior skin assessment due to pain in ribs with turning. Will continue to assess willingness.

## 2019-12-21 NOTE — Progress Notes (Signed)
Physical Therapy Treatment Patient Details Name: Casey Gilmore MRN: [id number] DOB: May 28, 1941 Today's Date: 12/21/2019    History of Present Illness Casey Gilmore is a 79 y.o. male with medical history significant of UGI bleeding; s/p bioprosthetic AVR; afib; CAD; HTN; HLD; DM; COPD; and vocal cord CA presenting with fall.  He was found to have hypoglycemia with multiple rib fractures and a small pneumothorax.    PT Comments    Patient seen for mobility progression. Pt continues to be limited by L LE pain and unable to attempt functional transfer training or increase L knee flexion this session. Current plan remains appropriate.    Follow Up Recommendations  SNF;Supervision/Assistance - 24 hour     Equipment Recommendations  None recommended by PT    Recommendations for Other Services       Precautions / Restrictions Precautions Precautions: Fall Precaution Comments: L leg length discrepancy Restrictions Weight Bearing Restrictions: No    Mobility  Bed Mobility Overal bed mobility: Needs Assistance Bed Mobility: Supine to Sit;Sit to Supine     Supine to sit: HOB elevated;+2 for physical assistance;Max assist Sit to supine: +2 for physical assistance;Total assist   General bed mobility comments: pt keeping R LE hooked under LLE throughout all transfer. pt needs extended time to work toward EOB. pt requires pad and helicopter to EOB with therapist maintaining LLE; pt maintaining L knee extension in sitting EOB and holding onto foot board to promote coming into neutral position however pt unable to sit upright; total A +2 for sit to supine   Transfers                    Ambulation/Gait                 Stairs             Wheelchair Mobility    Modified Rankin (Stroke Patients Only)       Balance                                            Cognition Arousal/Alertness: Awake/alert Behavior During Therapy:  Anxious Overall Cognitive Status: No family/caregiver present to determine baseline cognitive functioning                                 General Comments: oriented to situation, h/o dementia      Exercises      General Comments General comments (skin integrity, edema, etc.): skin tear L elbow and banadage placed      Pertinent Vitals/Pain Pain Assessment: Faces Faces Pain Scale: Hurts whole lot Pain Location: L LE with movement and L ribs (L foot painful with tactile input) Pain Descriptors / Indicators: Grimacing;Moaning;Guarding Pain Intervention(s): Limited activity within patient's tolerance;Monitored during session;Repositioned;Patient requesting pain meds-RN notified    Home Living                      Prior Function            PT Goals (current goals can now be found in the care plan section) Acute Rehab PT Goals Patient Stated Goal: to get pain medication for LLE and RN notified Progress towards PT goals: Progressing toward goals    Frequency    Min 3X/week  PT Plan Current plan remains appropriate    Co-evaluation PT/OT/SLP Co-Evaluation/Treatment: Yes            AM-PAC PT "6 Clicks" Mobility   Outcome Measure  Help needed turning from your back to your side while in a flat bed without using bedrails?: Total Help needed moving from lying on your back to sitting on the side of a flat bed without using bedrails?: Total Help needed moving to and from a bed to a chair (including a wheelchair)?: Total Help needed standing up from a chair using your arms (e.g., wheelchair or bedside chair)?: Total Help needed to walk in hospital room?: Total Help needed climbing 3-5 steps with a railing? : Total 6 Click Score: 6    End of Session Equipment Utilized During Treatment: Oxygen Activity Tolerance: Patient limited by pain Patient left: in bed;with call bell/phone within reach;with bed alarm set Nurse Communication: Mobility  status;Other (comment)(L LE pain) PT Visit Diagnosis: Other abnormalities of gait and mobility (R26.89);Pain Pain - Right/Left: Left Pain - part of body: Knee     Time: [id number] PT Time Calculation (min) (ACUTE ONLY): 32 min  Charges:  $Therapeutic Activity: 8-22 mins                     Earney Navy, PTA Acute Rehabilitation Services Pager: [phone] Office: [phone]     Darliss Cheney 12/21/2019, 12:40 PM

## 2019-12-21 NOTE — Progress Notes (Addendum)
COvid test was done this morning for SNF placement and the test come back positive. Paged MD and MD advised to place pt on precaution and possibly redo Covid test

## 2019-12-21 NOTE — TOC Progression Note (Signed)
Transition of Care [facility]) - Progression Note    Patient Details  Name: Casey Gilmore MRN: [id number] Date of Birth: [birth year]  [facility] CM/SW [place], [place] Phone Number: 12/21/2019, 4:52 PM  Clinical Narrative:   CSW spoke with patient's don,Doug-provided bed offers. He requested ST SNF placement in the [place] area.   [place]- no availability until late next week. [place]- no availability until next week  [place] - no availability until next week  Thurmond Butts, MSW, [place] Worker   Expected Discharge Plan: [place] Barriers to Discharge: Continued Medical Work up  Expected Discharge Plan and Services Expected Discharge Plan: [place] Choice: [place] arrangements for the past 2 months: Single Family Home                                       Social Determinants of Health (SDOH) Interventions    Readmission Risk Interventions No flowsheet data found.

## 2019-12-21 NOTE — Progress Notes (Signed)
Due to pain L ribs and L leg. Pt refused to be repositioned.

## 2019-12-21 NOTE — Care Management Important Message (Signed)
Important Message  Patient Details  Name: EDILBERTO JOBSON MRN: [id number] Date of Birth: 08-05-1941   Medicare Important Message Given:  Yes     Shelda Altes 12/21/2019, 10:58 AM

## 2019-12-21 NOTE — Progress Notes (Signed)
PROGRESS NOTE    Casey Gilmore  [id number] DOB: [birth year] DOA: 12/17/2019 PCP: Mayra Neer, MD   Brief Narrative:  Casey Gilmore is a 79 y.o. male with medical history significant of UGI bleeding; s/p bioprosthetic AVR; afib; CAD; HTN; HLD; DM; COPD; and vocal cord CA presenting with fall.  He was found to have hypoglycemia with multiple rib fractures and a small pneumothorax.  Surgery is following and recommending conservative management.  Subjective: Patient continued to complain pain on his left chest and left leg.  Yesterday seen by orthopedic with unsuccessful tape for knee infusion.  Assessment & Plan:   Principal Problem:   Fall Active Problems:   Diabetes mellitus ([place])   Chronic diastolic heart failure (HCC)   Benign essential HTN   COPD (chronic obstructive pulmonary disease) with chronic bronchitis (HCC)   Chronic pain syndrome   Multiple closed fractures of ribs of left side   Pneumothorax, closed, traumatic, initial encounter   Atrial fibrillation with RVR ([place])  Fall with rib fractures and PTX Patient with fall at home on 12/16/2019, suffering from multiple rib fractures and traumatic PTX. Chest x-ray with stable pneumothorax.  Surgery is recommending conservative management. -Continue with incentive spirometry and flutter valve. -Continue with pain management. -Add lidocaine patch for rib pain. -PT/OT -recommending SNF placement.  Social worker working on placement.  COVID-19 positive??  COVID-19 was done for SNF placement today which came back positive.  Patient remained asymptomatic. -Contacted Dr. Baxter Flattery from [facility] for advice.  Left knee pain.  Patient had an left knee replacement done many years ago.  Complaining of worsening left knee pain.  Left knee x-ray obtained which shows effusion and intact hardware.  Patient remained afebrile.  There was no visible edema or erythema on exam.  Tender to touch. -Consult orthopedic-they tried multiple times  to tape, remain unsuccessful, not sure whether they injected steroid or not-not a clear note. Uric acid within normal limit.  Acute on chronic hypoxic respiratory failure Patient has underlying COPD but is not on home O2.  Patient was saturating well on room air this morning.  Fib with RVR.  Initially managed with Cardizem infusion.  Now back in sinus rhythm.  TSH within normal limit.  Most likely triggered with injury. Cardiology is following-appreciate the recommendations. -IV Cardizem with p.o. -Not a candidate for anticoagulation because of falls and history of GI bleed. -Echocardiogram with normal ejection fraction and grade 2 diastolic dysfunction. -Cardiology signed off today.  Diastolic CHF.  Does not appear volume overload.  History of bioprosthetic valve. -Cardiology ordered repeat echo-no significant change, grade 2 diastolic dysfunction with normal ejection fraction.  Hypocalcemia/hypokalemia.  No labs this morning, despite ordering yesterday.  Corrected calcium of 7.7 and potassium of 4.3 yesterday. -Reordered labs. -Parathyroid within normal limit of 23. -Continue with calcium supplement.  Diabetes.  Initially hypoglycemic, now CBG mildly elevated.   A1c 5.8-high risk for hypoglycemia.  -Continue to monitor. -SSI.  Chronic pain.  On Vicodin and Robaxin at home. [place] controlled substance reporting system was appropriate. -Being managed with oxycodone and Robaxin currently. -Add K pad and lidocaine patch.  Objective: Vitals:   12/21/19 0451 12/21/19 0503 12/21/19 0725 12/21/19 1152  BP: 116/81  127/64 (!) 138/93  Pulse: 84  78 79  Resp: 19  18 18   Temp: 98.5 F (36.9 C)  98.3 F (36.8 C) 98.6 F (37 C)  TempSrc: Oral  Oral Oral  SpO2: 95%  92%   Weight:  72.1 kg  Height:        Intake/Output Summary (Last 24 hours) at 12/21/2019 1605 Last data filed at 12/21/2019 1300 Gross per 24 hour  Intake 760 ml  Output 750 ml  Net 10 ml   Filed Weights    12/19/19 0144 12/20/19 0424 12/21/19 0503  Weight: 74.8 kg 78.5 kg 72.1 kg    Examination:  General exam: Appears calm and comfortable  Respiratory system: Clear to auscultation. Respiratory effort normal. Cardiovascular system: S1 & S2 heard, RRR. No JVD, murmurs, rubs, gallops or clicks. Gastrointestinal system: Soft, nontender, nondistended, bowel sounds positive. Central nervous system: Alert and oriented. No focal neurological deficits. Extremities: No edema, no cyanosis, pulses intact and symmetrical. Skin: Multiple ecchymosis and a small clean laceration over right eye brow. Psychiatry: Judgement and insight appear normal. Mood & affect appropriate.    DVT prophylaxis: SCDs Code Status: DNR Family Communication: No family at bedside. Disposition Plan: SNF placement once bed is available.  Not a safe discharge to home.  Consultants:   Surgery  Cardiology  Orthopedic  Procedures:  Antimicrobials:   Data Reviewed: I have personally reviewed following labs and imaging studies  CBC: Recent Labs  Lab 12/17/19 0955 12/18/19 0510 12/20/19 1432  WBC 16.2* 12.7* 12.6*  NEUTROABS 14.6*  --  10.2*  HGB 11.1* 9.5* 9.8*  HCT 37.4* 31.2* 32.0*  MCV 77.1* 75.4* 74.6*  PLT 420* 315 [id number]   Basic Metabolic Panel: Recent Labs  Lab 12/17/19 0955 12/18/19 0510 12/18/19 1244 12/19/19 0439 12/20/19 1432  NA 143 142 140 143 140  K 3.4* 3.0* 3.6 4.3 4.3  CL 100 101 99 103 99  CO2 26 28 27 28 28   GLUCOSE 53* 96 195* 167* 222*  BUN 22 22 24* 26* 29*  CREATININE 1.68* 1.23 1.26* 1.22 1.21  CALCIUM 6.5* 6.1* 6.3* 6.4* 6.8*  PHOS  --   --  3.9 3.1 3.2   GFR: Estimated Creatinine Clearance: 51.3 mL/min (by C-G formula based on SCr of 1.21 mg/dL). Liver Function Tests: Recent Labs  Lab 12/18/19 0510 12/18/19 1244 12/19/19 0439 12/20/19 1432  AST 16  --   --   --   ALT 8  --   --   --   ALKPHOS 71  --   --   --   BILITOT 1.1  --   --   --   PROT 5.3*  --   --   --     ALBUMIN 2.5* 2.5* 2.4* 2.3*   No results for input(s): LIPASE, AMYLASE in the last 168 hours. No results for input(s): AMMONIA in the last 168 hours. Coagulation Profile: No results for input(s): INR, PROTIME in the last 168 hours. Cardiac Enzymes: No results for input(s): CKTOTAL, CKMB, CKMBINDEX, TROPONINI in the last 168 hours. BNP (last 3 results) No results for input(s): PROBNP in the last 8760 hours. HbA1C: No results for input(s): HGBA1C in the last 72 hours. CBG: Recent Labs  Lab 12/20/19 2019 12/21/19 0007 12/21/19 0444 12/21/19 0747 12/21/19 1211  GLUCAP 95 123* 137* 132* 145*   Lipid Profile: No results for input(s): CHOL, HDL, LDLCALC, TRIG, CHOLHDL, LDLDIRECT in the last 72 hours. Thyroid Function Tests: No results for input(s): TSH, T4TOTAL, FREET4, T3FREE, THYROIDAB in the last 72 hours. Anemia Panel: No results for input(s): VITAMINB12, FOLATE, FERRITIN, TIBC, IRON, RETICCTPCT in the last 72 hours. Sepsis Labs: No results for input(s): PROCALCITON, LATICACIDVEN in the last 168 hours.  Recent Results (from the past 240 hour(s))  Urine culture     Status: Abnormal   Collection Time: 12/17/19  9:55 AM   Specimen: Urine, Random  Result Value Ref Range Status   Specimen Description URINE, RANDOM  Final   Special Requests NONE  Final   Culture (A)  Final    <10,000 COLONIES/mL INSIGNIFICANT GROWTH Performed at [facility], [address], [place], [place] [address]    Report Status 12/18/2019 FINAL  Final  SARS CORONAVIRUS 2 (TAT 6-24 HRS) Nasopharyngeal Nasopharyngeal Swab     Status: None   Collection Time: 12/17/19  2:30 PM   Specimen: Nasopharyngeal Swab  Result Value Ref Range Status   SARS Coronavirus 2 NEGATIVE NEGATIVE Final    Comment: (NOTE) SARS-CoV-2 target nucleic acids are NOT DETECTED. The SARS-CoV-2 RNA is generally detectable in upper and lower respiratory specimens during the acute phase of infection. Negative results do not  preclude SARS-CoV-2 infection, do not rule out co-infections with other pathogens, and should not be used as the sole basis for treatment or other patient management decisions. Negative results must be combined with clinical observations, patient history, and epidemiological information. The expected result is Negative. Fact Sheet for Patients: SugarRoll.be Fact Sheet for Healthcare Providers: https://www.woods-mathews.com/ This test is not yet approved or cleared by the [place] FDA and  has been authorized for detection and/or diagnosis of SARS-CoV-2 by FDA under an Emergency Use Authorization (EUA). This EUA will remain  in effect (meaning this test can be used) for the duration of the COVID-19 declaration under Section 56 4(b)(1) of the Act, 21 U.S.C. section 360bbb-3(b)(1), unless the authorization is terminated or revoked sooner. Performed at [facility], [place] [address] [place], [place] [address]   SARS CORONAVIRUS 2 (TAT 6-24 HRS) Nasopharyngeal Nasopharyngeal Swab     Status: Abnormal   Collection Time: 12/21/19  9:46 AM   Specimen: Nasopharyngeal Swab  Result Value Ref Range Status   SARS Coronavirus 2 POSITIVE (A) NEGATIVE Final    Comment: RESULT CALLED TO, READ BACK BY AND VERIFIED WITH: T.NGU RN 1534 12/21/19 MCCORMICK K (NOTE) SARS-CoV-2 target nucleic acids are DETECTED. The SARS-CoV-2 RNA is generally detectable in upper and lower respiratory specimens during the acute phase of infection. Positive results are indicative of the presence of SARS-CoV-2 RNA. Clinical correlation with patient history and other diagnostic information is  necessary to determine patient infection status. Positive results do not rule out bacterial infection or co-infection with other viruses.  The expected result is Negative. Fact Sheet for Patients: SugarRoll.be Fact Sheet for Healthcare  Providers: https://www.woods-mathews.com/ This test is not yet approved or cleared by the [place] FDA and  has been authorized for detection and/or diagnosis of SARS-CoV-2 by FDA under an Emergency Use Authorization (EUA). This EUA will remain  in effect (meaning this test can be used) for the  duration of the COVID-19 declaration under Section 564(b)(1) of the Act, 21 U.S.C. section 360bbb-3(b)(1), unless the authorization is terminated or revoked sooner. 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 No periprosthetic fracture evidence of subsidence or other acute complication is seen. There is a moderate knee effusion. Extensive vascular calcium is  noted posterior to the knee. Question a remote posttraumatic deformity of the proximal fibula. Appearance is unchanged from comparison. IMPRESSION: 1. Stable appearance of total left knee arthroplasty without evidence of acute complication. 2. Moderate knee effusion. No acute fracture or traumatic malalignment. 3. Extensive vascular calcium posterior to the knee. Electronically Signed   By: Lovena Le M.D.   On: 12/19/2019 20:39    Scheduled Meds: . acetaminophen  650 mg Oral Q6H  . calcium carbonate  1 tablet Oral BID WC  . diltiazem  120 mg Oral Daily  . docusate sodium  100 mg Oral BID  . insulin aspart  0-15 Units Subcutaneous TID WC  . lidocaine  1 patch Transdermal Q24H  . lidocaine-EPINEPHrine  10 mL Intradermal Once  . methocarbamol  500 mg Oral TID  . pantoprazole  40 mg Oral Daily  . sodium chloride flush  3 mL Intravenous Q12H   Continuous Infusions:   LOS: 4 days   Time spent: 30  minutes  Lorella Nimrod, MD [facility] Pager [phone]  If 7PM-7AM, please contact night-coverage [email or web address] Password [facility] 12/21/2019, 4:05 PM   This record has been created using Dragon voice recognition software. Errors have been sought and corrected,but may not always be located. Such creation errors do not reflect on the standard of care.

## 2019-12-21 NOTE — Progress Notes (Signed)
Occupational Therapy Treatment Patient Details Name: Casey Gilmore MRN: [id number] DOB: 1941-04-03 Today's Date: 12/21/2019    History of present illness Casey Gilmore is a 79 y.o. male with medical history significant of UGI bleeding; s/p bioprosthetic AVR; afib; CAD; HTN; HLD; DM; COPD; and vocal cord CA presenting with fall.  He was found to have hypoglycemia with multiple rib fractures and a small pneumothorax.   OT comments  Pt with good effort and willing to attempt bed mobility, but limited by significant L LE pain. Demonstrating poor sitting balance with leaning toward R to avoid flexing L hip and placing weight on L side. Unable to attempt to stand. Left pt positioned in sitting in bed and set up for grooming and self feeding. NT aware condom cath needs replacing.  Follow Up Recommendations  SNF    Equipment Recommendations  Other (comment)(defer to next venue)    Recommendations for Other Services      Precautions / Restrictions Precautions Precautions: Fall Precaution Comments: painful L hip and knee, premedicate Restrictions Weight Bearing Restrictions: No       Mobility Bed Mobility Overal bed mobility: Needs Assistance Bed Mobility: Supine to Sit;Sit to Supine     Supine to sit: HOB elevated;+2 for physical assistance;Max assist Sit to supine: +2 for physical assistance;Total assist   General bed mobility comments: pt keeping R LE hooked under LLE throughout all transfer. pt needs extended time to work toward EOB. pt requires pad and helicopter to EOB with therapist maintaining LLE; pt maintaining L knee extension in sitting EOB and holding onto foot board to promote coming into neutral position however pt unable to sit upright; total A +2 for sit to supine   Transfers                 General transfer comment: NT--L knee too painful to attempt    Balance Overall balance assessment: Needs assistance Sitting-balance support: Bilateral upper  extremity supported;Feet supported Sitting balance-Leahy Scale: Poor Sitting balance - Comments: leaning R, poor tolerance of weight through L hip                                   ADL either performed or assessed with clinical judgement   ADL Overall ADL's : Needs assistance/impaired Eating/Feeding: Set up;Bed level Eating/Feeding Details (indicate cue type and reason): assist to open containers Grooming: Wash/dry hands;Bed level;Set up                                 General ADL Comments: only able to progress patient ot EOB due to LLE so extreme pain and unable to tolerate any more     Vision       Perception     Praxis      Cognition Arousal/Alertness: Awake/alert Behavior During Therapy: Anxious Overall Cognitive Status: Within Functional Limits for tasks assessed                                 General Comments: h/o dementia per chart        Exercises     Shoulder Instructions       General Comments skin tear L elbow and banadage placed    Pertinent Vitals/ Pain       Pain Assessment: Faces Faces  Pain Scale: Hurts whole lot Pain Location: L LE with movement and L ribs (L foot painful with tactile input) Pain Descriptors / Indicators: Grimacing;Moaning;Guarding Pain Intervention(s): Monitored during session;Repositioned;Patient requesting pain meds-RN notified  Home Living                                          Prior Functioning/Environment              Frequency  Min 2X/week        Progress Toward Goals  OT Goals(current goals can now be found in the care plan section)  Progress towards OT goals: Not progressing toward goals - comment  Acute Rehab OT Goals Patient Stated Goal: to get pain medication for LLE and RN notified OT Goal Formulation: With patient Time For Goal Achievement: 01/02/20 Potential to Achieve Goals: [place] Discharge plan remains appropriate     Co-evaluation    PT/OT/SLP Co-Evaluation/Treatment: Yes Reason for Co-Treatment: For patient/therapist safety   OT goals addressed during session: ADL's and self-care      AM-PAC OT "6 Clicks" Daily Activity     Outcome Measure   Help from another person eating meals?: A Little Help from another person taking care of personal grooming?: A Little Help from another person toileting, which includes using toliet, bedpan, or urinal?: Total Help from another person bathing (including washing, rinsing, drying)?: Total Help from another person to put on and taking off regular upper body clothing?: A Little Help from another person to put on and taking off regular lower body clothing?: Total 6 Click Score: 12    End of Session Equipment Utilized During Treatment: Oxygen  OT Visit Diagnosis: Muscle weakness (generalized) (M62.81);Pain   Activity Tolerance Patient limited by pain   Patient Left in bed;with call bell/phone within reach;with bed alarm set   Nurse Communication Mobility status;Precautions;Need for lift equipment        Time: [id number] OT Time Calculation (min): 38 min  Charges: OT General Charges $OT Visit: 1 Visit OT Treatments $Therapeutic Activity: 8-22 mins  Nestor Lewandowsky, OTR/L Acute Rehabilitation Services Pager: [phone] Office: [phone]   Malka So 12/21/2019, 1:31 PM

## 2019-12-21 NOTE — TOC Progression Note (Signed)
Transition of Care [facility]) - Progression Note    Patient Details  Name: RIGO LARKE MRN: [id number] Date of Birth: Nov 06, 1941  Transition of Care [facility]) CM/SW [place], [place] Phone Number: 12/21/2019, 4:58 PM  Clinical Narrative:     CSW following, to determine  if the patient is truly covid positive so that the  appropriate ST rehab can be searched.   Thurmond Butts, MSW, [place] Clinical Social Worker   Expected Discharge Plan: Skilled Nursing Facility Barriers to Discharge: Continued Medical Work up  Expected Discharge Plan and Services Expected Discharge Plan: [place] Choice: [place] arrangements for the past 2 months: Single Family Home                                       Social Determinants of Health (SDOH) Interventions    Readmission Risk Interventions No flowsheet data found.

## 2019-12-22 LAB — RENAL FUNCTION PANEL
Albumin: 2.1 g/dL — ABNORMAL LOW (ref 3.5–5.0)
Anion gap: 15 (ref 5–15)
BUN: 28 mg/dL — ABNORMAL HIGH (ref 8–23)
CO2: 24 mmol/L (ref 22–32)
Calcium: 7.1 mg/dL — ABNORMAL LOW (ref 8.9–10.3)
Chloride: 104 mmol/L (ref 98–111)
Creatinine, Ser: 1.24 mg/dL (ref 0.61–1.24)
GFR calc Af Amer: >60 mL/min (ref >60)
GFR calc non Af Amer: 55 mL/min — ABNORMAL LOW (ref >60)
Glucose, Bld: 158 mg/dL — ABNORMAL HIGH (ref 70–99)
Phosphorus: 3.2 mg/dL (ref 2.5–4.6)
Potassium: 4.7 mmol/L (ref 3.5–5.1)
Sodium: 143 mmol/L (ref 135–145)

## 2019-12-22 LAB — CBC
HCT: 32.5 % — ABNORMAL LOW (ref 39.0–52.0)
Hemoglobin: 9.9 g/dL — ABNORMAL LOW (ref 13.0–17.0)
MCH: 22.8 pg — ABNORMAL LOW (ref 26.0–34.0)
MCHC: 30.5 g/dL (ref 30.0–36.0)
MCV: 74.9 fL — ABNORMAL LOW (ref 80.0–100.0)
Platelets: 273 10*3/uL (ref 150–400)
RBC: 4.34 MIL/uL (ref 4.22–5.81)
RDW: 19.5 % — ABNORMAL HIGH (ref 11.5–15.5)
WBC: 10.3 10*3/uL (ref 4.0–10.5)
nRBC: 0 % (ref 0.0–0.2)

## 2019-12-22 LAB — GLUCOSE, CAPILLARY
Glucose-Capillary: 110 mg/dL — ABNORMAL HIGH (ref 70–99)
Glucose-Capillary: 137 mg/dL — ABNORMAL HIGH (ref 70–99)
Glucose-Capillary: 152 mg/dL — ABNORMAL HIGH (ref 70–99)
Glucose-Capillary: 157 mg/dL — ABNORMAL HIGH (ref 70–99)
Glucose-Capillary: 164 mg/dL — ABNORMAL HIGH (ref 70–99)
Glucose-Capillary: 175 mg/dL — ABNORMAL HIGH (ref 70–99)

## 2019-12-22 NOTE — Progress Notes (Signed)
PROGRESS NOTE    Casey Gilmore  [id number] DOB: 1941/08/16 DOA: 12/17/2019 PCP: Mayra Neer, MD   Brief Narrative:  Casey Gilmore is a 79 y.o. male with medical history significant of UGI bleeding; s/p bioprosthetic AVR; afib; CAD; HTN; HLD; DM; COPD; and vocal cord CA presenting with fall.  He was found to have hypoglycemia with multiple rib fractures and a small pneumothorax.  Surgery is following and recommending conservative management.  Subjective: No fever or chills  Assessment & Plan:   Principal Problem:   Fall Active Problems:   Diabetes mellitus ([place])   Chronic diastolic heart failure (HCC)   Benign essential HTN   COPD (chronic obstructive pulmonary disease) with chronic bronchitis (HCC)   Chronic pain syndrome   Multiple closed fractures of ribs of left side   Pneumothorax, closed, traumatic, initial encounter   Atrial fibrillation with RVR ([place])  Fall with rib fractures and PTX Patient with fall at home on [id number], complicated by multiple rib fractures and traumatic PTX-chest x-ray stable-surgery recommends conservative management -Continue with incentive spirometry and flutter valve. -Continue with pain management. .-PT/OT -recommended SNF placement -awaiting placement  COVID-19 positive   COVID-19 was done for SNF placement today which came back positive.  Patient remained asymptomatic.   Left knee pain.   Patient had an left knee replacement done many years ago.  Complained of worsening left knee pain.  Left knee x-ray obtained which showed effusion and intact hardware.  Patient remained afebrile.  There was no visible edema or erythema on exam.  Tender to touch. -Consult orthopedic-they tried multiple times to tape, remain unsuccessful,supportive care  Acute on chronic hypoxic respiratory failure Patient has underlying COPD but is not on home O2.  Patient saturating well on room air   Fib with RVR.   Initially managed with Cardizem  infusion.   Now back in sinus rhythm.   TSH within normal limit.  -Rate control -Not a candidate for anticoagulation because of falls and history of GI bleed. -Echocardiogram with normal ejection fraction and grade 2 diastolic dysfunction. -Cardiology signed off today.  Diastolic CHF.  Does not appear volume overload.  History of bioprosthetic valve. -Cardiology ordered repeat echo-no significant change, grade 2 diastolic dysfunction with normal ejection fraction.  Hypocalcemia/hypokalemia.  No labs this morning, despite ordering yesterday.  Corrected calcium of 7.7 and potassium of 4.3 yesterday. -Reordered labs. -Parathyroid within normal limit of 23. -Continue with calcium supplement.  Diabetes.  Initially hypoglycemic, now CBG mildly elevated.   A1c 5.8-high risk for hypoglycemia.  -Continue to monitor. -SSI.  Chronic pain.  On Vicodin and Robaxin at home. [place] controlled substance reporting system was appropriate. -Being managed with oxycodone and Robaxin currently. -Add K pad and lidocaine patch.  Objective: Vitals:   12/22/19 0546 12/22/19 0550 12/22/19 0800 12/22/19 1227  BP:  (!) 142/75 (!) 151/85 138/73  Pulse:  76 75 81  Resp:  18 18 18   Temp:  98.3 F (36.8 C) 98.6 F (37 C)   TempSrc:  Oral Oral   SpO2:  95% 97% 96%  Weight: 77.6 kg     Height:        Intake/Output Summary (Last 24 hours) at 12/22/2019 1322 Last data filed at 12/22/2019 0800 Gross per 24 hour  Intake 440 ml  Output 500 ml  Net -60 ml   Filed Weights   12/20/19 0424 12/21/19 0503 12/22/19 0546  Weight: 78.5 kg 72.1 kg 77.6 kg    Examination:  General  exam: comfortable  Respiratory system: Clear to auscultation. Respiratory effort normal. Cardiovascular system: RRR. No JVD, murmurs, rubs, gallops or clicks. Gastrointestinal system: Soft, nontender, nondistended, bowel sounds positive. Central nervous system: Alert and oriented. No focal neurological deficits. Extremities: No edema,  no cyanosis, pulses intact and symmetrical. Skin: Multiple ecchymosis and a small clean laceration over right eye brow. Psychiatry: Judgement and insight appear normal. Mood & affect appropriate.    DVT prophylaxis: SCDs Code Status: DNR Family Communication: Son updated by phone. Disposition Plan: SNF placement once bed is available.  Not a safe discharge to home.  Consultants:   Surgery  Cardiology  Orthopedic  Procedures:  Antimicrobials:   Data Reviewed: I have personally reviewed following labs and imaging studies  CBC: Recent Labs  Lab 12/17/19 0955 12/18/19 0510 12/20/19 1432 12/22/19 0526  WBC 16.2* 12.7* 12.6* 10.3  NEUTROABS 14.6*  --  10.2*  --   HGB 11.1* 9.5* 9.8* 9.9*  HCT 37.4* 31.2* 32.0* 32.5*  MCV 77.1* 75.4* 74.6* 74.9*  PLT 420* 315 364 [id number]   Basic Metabolic Panel: Recent Labs  Lab 12/18/19 0510 12/18/19 1244 12/19/19 0439 12/20/19 1432 12/22/19 0526  NA 142 140 143 140 143  K 3.0* 3.6 4.3 4.3 4.7  CL 101 99 103 99 104  CO2 28 27 28 28 24   GLUCOSE 96 195* 167* 222* 158*  BUN 22 24* 26* 29* 28*  CREATININE 1.23 1.26* 1.22 1.21 1.24  CALCIUM 6.1* 6.3* 6.4* 6.8* 7.1*  PHOS  --  3.9 3.1 3.2 3.2   GFR: Estimated Creatinine Clearance: 53.9 mL/min (by C-G formula based on SCr of 1.24 mg/dL). Liver Function Tests: Recent Labs  Lab 12/18/19 0510 12/18/19 1244 12/19/19 0439 12/20/19 1432 12/22/19 0526  AST 16  --   --   --   --   ALT 8  --   --   --   --   ALKPHOS 71  --   --   --   --   BILITOT 1.1  --   --   --   --   PROT 5.3*  --   --   --   --   ALBUMIN 2.5* 2.5* 2.4* 2.3* 2.1*   No results for input(s): LIPASE, AMYLASE in the last 168 hours. No results for input(s): AMMONIA in the last 168 hours. Coagulation Profile: No results for input(s): INR, PROTIME in the last 168 hours. Cardiac Enzymes: No results for input(s): CKTOTAL, CKMB, CKMBINDEX, TROPONINI in the last 168 hours. BNP (last 3 results) No results for input(s):  PROBNP in the last 8760 hours. HbA1C: No results for input(s): HGBA1C in the last 72 hours. CBG: Recent Labs  Lab 12/21/19 2107 12/22/19 0046 12/22/19 0551 12/22/19 0803 12/22/19 1213  GLUCAP 162* 157* 152* 137* 175*   Lipid Profile: No results for input(s): CHOL, HDL, LDLCALC, TRIG, CHOLHDL, LDLDIRECT in the last 72 hours. Thyroid Function Tests: No results for input(s): TSH, T4TOTAL, FREET4, T3FREE, THYROIDAB in the last 72 hours. Anemia Panel: No results for input(s): VITAMINB12, FOLATE, FERRITIN, TIBC, IRON, RETICCTPCT in the last 72 hours. Sepsis Labs: No results for input(s): PROCALCITON, LATICACIDVEN in the last 168 hours.  Recent Results (from the past 240 hour(s))  Urine culture     Status: Abnormal   Collection Time: 12/17/19  9:55 AM   Specimen: Urine, Random  Result Value Ref Range Status   Specimen Description URINE, RANDOM  Final   Special Requests NONE  Final   Culture (  A)  Final    <10,000 COLONIES/mL INSIGNIFICANT GROWTH Performed at [facility] [address], [place], [place] [address]    Report Status 12/18/2019 FINAL  Final  SARS CORONAVIRUS 2 (TAT 6-24 HRS) Nasopharyngeal Nasopharyngeal Swab     Status: None   Collection Time: 12/17/19  2:30 PM   Specimen: Nasopharyngeal Swab  Result Value Ref Range Status   SARS Coronavirus 2 NEGATIVE NEGATIVE Final    Comment: (NOTE) SARS-CoV-2 target nucleic acids are NOT DETECTED. The SARS-CoV-2 RNA is generally detectable in upper and lower respiratory specimens during the acute phase of infection. Negative results do not preclude SARS-CoV-2 infection, do not rule out co-infections with other pathogens, and should not be used as the sole basis for treatment or other patient management decisions. Negative results must be combined with clinical observations, patient history, and epidemiological information. The expected result is Negative. Fact Sheet for  Patients: SugarRoll.be Fact Sheet for Healthcare Providers: https://www.woods-mathews.com/ This test is not yet approved or cleared by the [place] FDA and  has been authorized for detection and/or diagnosis of SARS-CoV-2 by FDA under an Emergency Use Authorization (EUA). This EUA will remain  in effect (meaning this test can be used) for the duration of the COVID-19 declaration under Section 56 4(b)(1) of the Act, 21 U.S.C. section 360bbb-3(b)(1), unless the authorization is terminated or revoked sooner. Performed at [facility], [place] [address] [place], [place] [address]   SARS CORONAVIRUS 2 (TAT 6-24 HRS) Nasopharyngeal Nasopharyngeal Swab     Status: Abnormal   Collection Time: 12/21/19  9:46 AM   Specimen: Nasopharyngeal Swab  Result Value Ref Range Status   SARS Coronavirus 2 POSITIVE (A) NEGATIVE Final    Comment: RESULT CALLED TO, READ BACK BY AND VERIFIED WITH: T.NGU RN 1534 12/21/19 MCCORMICK K (NOTE) SARS-CoV-2 target nucleic acids are DETECTED. The SARS-CoV-2 RNA is generally detectable in upper and lower respiratory specimens during the acute phase of infection. Positive results are indicative of the presence of SARS-CoV-2 RNA. Clinical correlation with patient history and other diagnostic information is  necessary to determine patient infection status. Positive results do not rule out bacterial infection or co-infection with other viruses.  The expected result is Negative. Fact Sheet for Patients: SugarRoll.be Fact Sheet for Healthcare Providers: https://www.woods-mathews.com/ This test is not yet approved or cleared by the [place] FDA and  has been authorized for detection and/or diagnosis of SARS-CoV-2 by FDA under an Emergency Use Authorization (EUA). This EUA will remain  in effect (meaning this test can be used) for the  duration of the COVID-19 declaration under  Section 564(b)(1) of the Act, 21 U.S.C. section 360bbb-3(b)(1), unless the authorization is terminated or revoked sooner. Performed at [facility], [place] [address], [place],  [address]      Radiology Studies: No results found.  Scheduled Meds: . acetaminophen  650 mg Oral Q6H  . calcium carbonate  1 tablet Oral BID WC  . diltiazem  120 mg Oral Daily  . docusate sodium  100 mg Oral BID  . insulin aspart  0-15 Units Subcutaneous TID WC  . lidocaine  1 patch Transdermal Q24H  . lidocaine-EPINEPHrine  10 mL Intradermal Once  . methocarbamol  500 mg Oral TID  . pantoprazole  40 mg Oral Daily  . sodium chloride flush  3 mL Intravenous Q12H   Continuous Infusions:   LOS: 5 days   Time spent: 25 minutes  Benito Mccreedy, MD [facility] Pager [phone]  If 7PM-7AM, please contact night-coverage [email or web address] Password [facility] 12/22/2019, 1:22 PM   This record has been created using Systems analyst. Errors have been sought and corrected,but may not always be located. Such creation errors do not reflect on the standard of care.

## 2019-12-22 NOTE — Progress Notes (Signed)
Patient continues to refuse turning due to pain. Offered pain medication but pt declined.

## 2019-12-23 LAB — GLUCOSE, CAPILLARY
Glucose-Capillary: 124 mg/dL — ABNORMAL HIGH (ref 70–99)
Glucose-Capillary: 127 mg/dL — ABNORMAL HIGH (ref 70–99)
Glucose-Capillary: 131 mg/dL — ABNORMAL HIGH (ref 70–99)
Glucose-Capillary: 131 mg/dL — ABNORMAL HIGH (ref 70–99)
Glucose-Capillary: 135 mg/dL — ABNORMAL HIGH (ref 70–99)
Glucose-Capillary: 149 mg/dL — ABNORMAL HIGH (ref 70–99)

## 2019-12-23 NOTE — TOC Progression Note (Addendum)
Transition of Care [facility]) - Progression Note    Patient Details  Name: TRENDEN SUPPES MRN: [id number] Date of Birth: 09-23-1941  Transition of Care [facility]) CM/SW [place], [place] Phone Number: [phone] 12/23/2019, 10:26 AM  Clinical Narrative:     CSW spoke with patient's son Marden Noble confirmed agreeable to [facility] discharge tomorrow 1/18 when bed avail.   CSW has initiated insurance auth today with [employer], all requested clinicals faxed.   Reference [id number]  Expected Discharge Plan: [place] Barriers to Discharge: Continued Medical Work up  Expected Discharge Plan and Services Expected Discharge Plan: [place] Choice: [place] arrangements for the past 2 months: Single Family Home                                       Social Determinants of Health (SDOH) Interventions    Readmission Risk Interventions No flowsheet data found.

## 2019-12-23 NOTE — Plan of Care (Signed)
  Problem: Education: Goal: Knowledge of General Education information will improve Description: Including pain rating scale, medication(s)/side effects and non-pharmacologic comfort measures Outcome: Progressing   Problem: Health Behavior/Discharge Planning: Goal: Ability to manage health-related needs will improve Outcome: Progressing   Problem: Safety: Goal: Ability to remain free from injury will improve Outcome: Progressing   Problem: Skin Integrity: Goal: Risk for impaired skin integrity will decrease Outcome: Progressing   Problem: Pain Managment: Goal: General experience of comfort will improve Outcome: Progressing

## 2019-12-23 NOTE — Progress Notes (Signed)
PROGRESS NOTE    Casey Gilmore  [id number] DOB: [birth year] DOA: 12/17/2019 PCP: Mayra Neer, MD   Brief Narrative:  Casey Gilmore is a 79 y.o. male with medical history significant of UGI bleeding; s/p bioprosthetic AVR; afib; CAD; HTN; HLD; DM; COPD; and vocal cord CA presenting with fall.  He was found to have hypoglycemia with multiple rib fractures and a small pneumothorax.  Surgery is following and recommending conservative management.  Subjective: No acute events but overnight.  Planned for discharge tomorrow to complete place  Assessment & Plan:   Principal Problem:   Fall Active Problems:   Diabetes mellitus ([place])   Chronic diastolic heart failure (HCC)   Benign essential HTN   COPD (chronic obstructive pulmonary disease) with chronic bronchitis (HCC)   Chronic pain syndrome   Multiple closed fractures of ribs of left side   Pneumothorax, closed, traumatic, initial encounter   Atrial fibrillation with RVR ([place])  Fall with rib fractures and PTX Patient with fall at home on [id number], complicated by multiple rib fractures and traumatic PTX-chest x-ray stable-surgery recommends conservative management -Continue with incentive spirometry and flutter valve. -Continue with pain management. .-PT/OT -recommended SNF placement -awaiting placement  COVID-19 positive   COVID-19 was done for SNF placement today which came back positive.  Patient remained asymptomatic.   Left knee pain.   Patient had an left knee replacement done many years ago.  Complained of worsening left knee pain.  Left knee x-ray obtained which showed effusion and intact hardware.  Patient remained afebrile.  There was no visible edema or erythema on exam.  Tender to touch. -Consult orthopedic-they tried multiple times to tape, remain unsuccessful,supportive care  Acute on chronic hypoxic respiratory failure Patient has underlying COPD but is not on home O2.  Patient saturating well on room  air   Fib with RVR.   Initially managed with Cardizem infusion.   Now back in sinus rhythm.   TSH within normal limit.  -Rate control -Not a candidate for anticoagulation because of falls and history of GI bleed. -Echocardiogram with normal ejection fraction and grade 2 diastolic dysfunction. -Cardiology signed off today.  Diastolic CHF.  Does not appear volume overload.  History of bioprosthetic valve. -Cardiology ordered repeat echo-no significant change, grade 2 diastolic dysfunction with normal ejection fraction.  Hypocalcemia/hypokalemia.  No labs this morning, despite ordering yesterday.  Corrected calcium of 7.7 and potassium of 4.3 yesterday. -Reordered labs. -Parathyroid within normal limit of 23. -Continue with calcium supplement.  Diabetes.  Initially hypoglycemic, now CBG mildly elevated.   A1c 5.8-high risk for hypoglycemia.  -Continue to monitor. -SSI.  Chronic pain.  On Vicodin and Robaxin at home. [place] controlled substance reporting system was appropriate. -Being managed with oxycodone and Robaxin currently.   Objective: Vitals:   12/22/19 2118 12/23/19 0555 12/23/19 0829 12/23/19 1254  BP: (!) 154/83 (!) 173/90 (!) 161/89 140/79  Pulse: 82 82 93 81  Resp: (!) 22 20 20 20   Temp: 98.3 F (36.8 C) 98.3 F (36.8 C) 97.8 F (36.6 C) 98.4 F (36.9 C)  TempSrc: Oral Oral Oral Oral  SpO2: 91% (!) 86% 93% 91%  Weight:  78 kg    Height:        Intake/Output Summary (Last 24 hours) at 12/23/2019 1423 Last data filed at 12/22/2019 2118 Gross per 24 hour  Intake 483 ml  Output 600 ml  Net -117 ml   Filed Weights   12/21/19 0503 12/22/19 0546 12/23/19 0555  Weight: 72.1 kg 77.6 kg 78 kg    Examination:  General exam: comfortable, no acute distress Respiratory system: Clear to auscultation. Respiratory effort normal. Cardiovascular system: RRR. No JVD, murmurs, rubs, gallops or clicks. Gastrointestinal system: Soft, nontender, nondistended, bowel sounds  positive. Central nervous system: Alert and oriented. No focal neurological deficits. Extremities: No edema, no cyanosis, pulses intact and symmetrical. Skin: Multiple ecchymosis and a small clean laceration over right eye brow. Psychiatry: Judgement and insight appear normal. Mood & affect appropriate.    DVT prophylaxis: SCDs Code Status: DNR Family Communication: Son updated by phone 12/22/2019 Disposition Plan: SNF placement once bed is available.  Not a safe discharge to home.  Consultants:   Surgery  Cardiology  Orthopedic  Procedures:  Antimicrobials:   Data Reviewed: I have personally reviewed following labs and imaging studies  CBC: Recent Labs  Lab 12/17/19 0955 12/18/19 0510 12/20/19 1432 12/22/19 0526  WBC 16.2* 12.7* 12.6* 10.3  NEUTROABS 14.6*  --  10.2*  --   HGB 11.1* 9.5* 9.8* 9.9*  HCT 37.4* 31.2* 32.0* 32.5*  MCV 77.1* 75.4* 74.6* 74.9*  PLT 420* 315 364 123456   Basic Metabolic Panel: Recent Labs  Lab 12/18/19 0510 12/18/19 1244 12/19/19 0439 12/20/19 1432 12/22/19 0526  NA 142 140 143 140 143  K 3.0* 3.6 4.3 4.3 4.7  CL 101 99 103 99 104  CO2 28 27 28 28 24   GLUCOSE 96 195* 167* 222* 158*  BUN 22 24* 26* 29* 28*  CREATININE 1.23 1.26* 1.22 1.21 1.24  CALCIUM 6.1* 6.3* 6.4* 6.8* 7.1*  PHOS  --  3.9 3.1 3.2 3.2   GFR: Estimated Creatinine Clearance: 53.9 mL/min (by C-G formula based on SCr of 1.24 mg/dL). Liver Function Tests: Recent Labs  Lab 12/18/19 0510 12/18/19 1244 12/19/19 0439 12/20/19 1432 12/22/19 0526  AST 16  --   --   --   --   ALT 8  --   --   --   --   ALKPHOS 71  --   --   --   --   BILITOT 1.1  --   --   --   --   PROT 5.3*  --   --   --   --   ALBUMIN 2.5* 2.5* 2.4* 2.3* 2.1*   No results for input(s): LIPASE, AMYLASE in the last 168 hours. No results for input(s): AMMONIA in the last 168 hours. Coagulation Profile: No results for input(s): INR, PROTIME in the last 168 hours. Cardiac Enzymes: No results  for input(s): CKTOTAL, CKMB, CKMBINDEX, TROPONINI in the last 168 hours. BNP (last 3 results) No results for input(s): PROBNP in the last 8760 hours. HbA1C: No results for input(s): HGBA1C in the last 72 hours. CBG: Recent Labs  Lab 12/22/19 2115 12/23/19 0027 12/23/19 0551 12/23/19 0808 12/23/19 1250  GLUCAP 110* 124* 127* 131* 149*   Lipid Profile: No results for input(s): CHOL, HDL, LDLCALC, TRIG, CHOLHDL, LDLDIRECT in the last 72 hours. Thyroid Function Tests: No results for input(s): TSH, T4TOTAL, FREET4, T3FREE, THYROIDAB in the last 72 hours. Anemia Panel: No results for input(s): VITAMINB12, FOLATE, FERRITIN, TIBC, IRON, RETICCTPCT in the last 72 hours. Sepsis Labs: No results for input(s): PROCALCITON, LATICACIDVEN in the last 168 hours.  Recent Results (from the past 240 hour(s))  Urine culture     Status: Abnormal   Collection Time: 12/17/19  9:55 AM   Specimen: Urine, Random  Result Value Ref Range Status  Specimen Description URINE, RANDOM  Final   Special Requests NONE  Final   Culture (A)  Final    <10,000 COLONIES/mL INSIGNIFICANT GROWTH Performed at [facility], [place] [address], [place], [place] [address]    Report Status 12/18/2019 FINAL  Final  SARS CORONAVIRUS 2 (TAT 6-24 HRS) Nasopharyngeal Nasopharyngeal Swab     Status: None   Collection Time: 12/17/19  2:30 PM   Specimen: Nasopharyngeal Swab  Result Value Ref Range Status   SARS Coronavirus 2 NEGATIVE NEGATIVE Final    Comment: (NOTE) SARS-CoV-2 target nucleic acids are NOT DETECTED. The SARS-CoV-2 RNA is generally detectable in upper and lower respiratory specimens during the acute phase of infection. Negative results do not preclude SARS-CoV-2 infection, do not rule out co-infections with other pathogens, and should not be used as the sole basis for treatment or other patient management decisions. Negative results must be combined with clinical observations, patient history, and  epidemiological information. The expected result is Negative. Fact Sheet for Patients: SugarRoll.be Fact Sheet for Healthcare Providers: https://www.woods-mathews.com/ This test is not yet approved or cleared by the [place] FDA and  has been authorized for detection and/or diagnosis of SARS-CoV-2 by FDA under an Emergency Use Authorization (EUA). This EUA will remain  in effect (meaning this test can be used) for the duration of the COVID-19 declaration under Section 56 4(b)(1) of the Act, 21 U.S.C. section 360bbb-3(b)(1), unless the authorization is terminated or revoked sooner. Performed at [facility], [place] [address], [place], [place] [address]   SARS CORONAVIRUS 2 (TAT 6-24 HRS) Nasopharyngeal Nasopharyngeal Swab     Status: Abnormal   Collection Time: 12/21/19  9:46 AM   Specimen: Nasopharyngeal Swab  Result Value Ref Range Status   SARS Coronavirus 2 POSITIVE (A) NEGATIVE Final    Comment: RESULT CALLED TO, READ BACK BY AND VERIFIED WITH: T.NGU RN 1534 12/21/19 MCCORMICK K (NOTE) SARS-CoV-2 target nucleic acids are DETECTED. The SARS-CoV-2 RNA is generally detectable in upper and lower respiratory specimens during the acute phase of infection. Positive results are indicative of the presence of SARS-CoV-2 RNA. Clinical correlation with patient history and other diagnostic information is  necessary to determine patient infection status. Positive results do not rule out bacterial infection or co-infection with other viruses.  The expected result is Negative. Fact Sheet for Patients: SugarRoll.be Fact Sheet for Healthcare Providers: https://www.woods-mathews.com/ This test is not yet approved or cleared by the [place] FDA and  has been authorized for detection and/or diagnosis of SARS-CoV-2 by FDA under an Emergency Use Authorization (EUA). This EUA will remain  in effect  (meaning this test can be used) for the  duration of the COVID-19 declaration under Section 564(b)(1) of the Act, 21 U.S.C. section 360bbb-3(b)(1), unless the authorization is terminated or revoked sooner. Performed at [facility], [place] [address] [place], [place] [address]      Radiology Studies: No results found.  Scheduled Meds: . acetaminophen  650 mg Oral Q6H  . calcium carbonate  1 tablet Oral BID WC  . diltiazem  120 mg Oral Daily  . docusate sodium  100 mg Oral BID  . insulin aspart  0-15 Units Subcutaneous TID WC  . lidocaine  1 patch Transdermal Q24H  . lidocaine-EPINEPHrine  10 mL Intradermal Once  . methocarbamol  500 mg Oral TID  . pantoprazole  40 mg Oral Daily  . sodium chloride flush  3 mL Intravenous Q12H   Continuous Infusions:  LOS: 6 days     Benito Mccreedy, MD [facility] Pager [phone]  If 7PM-7AM, please contact night-coverage [email or web address] Password [facility] 12/23/2019, 2:23 PM   This record has been created using Dragon voice recognition software. Errors have been sought and corrected,but may not always be located. Such creation errors do not reflect on the standard of care.

## 2019-12-24 DIAGNOSIS — R262 Difficulty in walking, not elsewhere classified: Secondary | ICD-10-CM | POA: Diagnosis not present

## 2019-12-24 DIAGNOSIS — R1312 Dysphagia, oropharyngeal phase: Secondary | ICD-10-CM | POA: Diagnosis not present

## 2019-12-24 DIAGNOSIS — U071 COVID-19: Secondary | ICD-10-CM | POA: Diagnosis not present

## 2019-12-24 DIAGNOSIS — M6281 Muscle weakness (generalized): Secondary | ICD-10-CM | POA: Diagnosis not present

## 2019-12-24 DIAGNOSIS — E11649 Type 2 diabetes mellitus with hypoglycemia without coma: Secondary | ICD-10-CM | POA: Diagnosis not present

## 2019-12-24 DIAGNOSIS — W19XXXS Unspecified fall, sequela: Secondary | ICD-10-CM

## 2019-12-24 DIAGNOSIS — M25462 Effusion, left knee: Secondary | ICD-10-CM | POA: Diagnosis not present

## 2019-12-24 DIAGNOSIS — R05 Cough: Secondary | ICD-10-CM | POA: Diagnosis not present

## 2019-12-24 DIAGNOSIS — R5381 Other malaise: Secondary | ICD-10-CM | POA: Diagnosis not present

## 2019-12-24 DIAGNOSIS — R41841 Cognitive communication deficit: Secondary | ICD-10-CM | POA: Diagnosis not present

## 2019-12-24 DIAGNOSIS — R2681 Unsteadiness on feet: Secondary | ICD-10-CM | POA: Diagnosis not present

## 2019-12-24 DIAGNOSIS — I480 Paroxysmal atrial fibrillation: Secondary | ICD-10-CM | POA: Diagnosis not present

## 2019-12-24 DIAGNOSIS — Z9181 History of falling: Secondary | ICD-10-CM | POA: Diagnosis not present

## 2019-12-24 DIAGNOSIS — Z7401 Bed confinement status: Secondary | ICD-10-CM | POA: Diagnosis not present

## 2019-12-24 DIAGNOSIS — I10 Essential (primary) hypertension: Secondary | ICD-10-CM | POA: Diagnosis not present

## 2019-12-24 DIAGNOSIS — D72828 Other elevated white blood cell count: Secondary | ICD-10-CM | POA: Diagnosis not present

## 2019-12-24 DIAGNOSIS — N138 Other obstructive and reflux uropathy: Secondary | ICD-10-CM | POA: Diagnosis not present

## 2019-12-24 DIAGNOSIS — R2689 Other abnormalities of gait and mobility: Secondary | ICD-10-CM | POA: Diagnosis not present

## 2019-12-24 DIAGNOSIS — I5032 Chronic diastolic (congestive) heart failure: Secondary | ICD-10-CM | POA: Diagnosis not present

## 2019-12-24 DIAGNOSIS — J939 Pneumothorax, unspecified: Secondary | ICD-10-CM

## 2019-12-24 DIAGNOSIS — I4891 Unspecified atrial fibrillation: Secondary | ICD-10-CM | POA: Diagnosis not present

## 2019-12-24 DIAGNOSIS — J441 Chronic obstructive pulmonary disease with (acute) exacerbation: Secondary | ICD-10-CM | POA: Diagnosis not present

## 2019-12-24 DIAGNOSIS — S270XXD Traumatic pneumothorax, subsequent encounter: Secondary | ICD-10-CM | POA: Diagnosis not present

## 2019-12-24 DIAGNOSIS — M2550 Pain in unspecified joint: Secondary | ICD-10-CM | POA: Diagnosis not present

## 2019-12-24 LAB — GLUCOSE, CAPILLARY
Glucose-Capillary: 123 mg/dL — ABNORMAL HIGH (ref 70–99)
Glucose-Capillary: 128 mg/dL — ABNORMAL HIGH (ref 70–99)
Glucose-Capillary: 131 mg/dL — ABNORMAL HIGH (ref 70–99)
Glucose-Capillary: 158 mg/dL — ABNORMAL HIGH (ref 70–99)

## 2019-12-24 LAB — VITAMIN D 25 HYDROXY (VIT D DEFICIENCY, FRACTURES): Vit D, 25-Hydroxy: 33.9 ng/mL (ref 30–100)

## 2019-12-24 MED ORDER — INSULIN ASPART 100 UNIT/ML ~~LOC~~ SOLN: 0.0000 [IU] | Freq: Three times a day (TID) | SUBCUTANEOUS | Status: AC

## 2019-12-24 MED ORDER — ACETAMINOPHEN 325 MG PO TABS: 650.0000 mg | ORAL_TABLET | Freq: Four times a day (QID) | ORAL | Status: AC

## 2019-12-24 MED ORDER — OXYCODONE HCL 5 MG PO TABS: 5.0000 mg | ORAL_TABLET | Freq: Three times a day (TID) | ORAL | 0 refills | Status: AC | PRN

## 2019-12-24 MED ORDER — DILTIAZEM HCL ER COATED BEADS 120 MG PO CP24: 120.0000 mg | ORAL_CAPSULE | Freq: Every day | ORAL | Status: AC

## 2019-12-24 MED ORDER — LIDOCAINE 5 % EX PTCH: 1.0000 | MEDICATED_PATCH | CUTANEOUS | Status: AC

## 2019-12-24 MED ORDER — CALCIUM CARBONATE 1250 (500 CA) MG PO TABS: 1.0000 | ORAL_TABLET | Freq: Two times a day (BID) | ORAL | Status: AC

## 2019-12-24 MED ORDER — DOCUSATE SODIUM 100 MG PO CAPS: 100.0000 mg | ORAL_CAPSULE | Freq: Two times a day (BID) | ORAL | Status: AC

## 2019-12-24 MED ORDER — ALBUTEROL SULFATE HFA 108 (90 BASE) MCG/ACT IN AERS: 2.0000 | INHALATION_SPRAY | Freq: Four times a day (QID) | RESPIRATORY_TRACT | Status: AC | PRN

## 2019-12-24 NOTE — Discharge Instructions (Signed)

## 2019-12-24 NOTE — Progress Notes (Signed)
Pt refused multiple times any turning and wound assessment.  pts pain is controled with tylenol and oxycodone with a score reported of 0 however pt still refuses wound assessment, pericare , and turning.  Will continue trying

## 2019-12-24 NOTE — TOC Transition Note (Addendum)
Transition of Care [facility]) - CM/SW Discharge Note   Patient Details  Name: Casey Gilmore MRN: [id number] Date of Birth: 25-Dec-1940  Transition of Care [facility]) CM/SW Contact:  Alberteen Sam, LCSW Phone Number:[phone] 12/24/2019, 1:44 PM   Clinical Narrative:     Patient will DC to: [facility] Anticipated DC date: 12/24/19 Family notified:[facility] [id number]  Per MD patient ready for DC to [facility] . RN, patient, patient's family, and facility notified of DC. Discharge Summary sent to facility. RN given number for report [phone] Room 801A  . DC packet on chart. Ambulance transport requested for patient.  CSW signing off.  [place], [place]    Final next level of care: Skilled Nursing Facility Barriers to Discharge: No Barriers Identified   Patient Goals and CMS Choice   CMS Medicare.gov Compare Post Acute Care list provided to:: Patient Represenative (must comment)(son Doug) Choice offered to / list presented to : Adult Children  Discharge Placement PASRR number recieved: 12/19/19            Patient chooses bed at: [facility] Patient to be transferred to facility by: [facility] Name of family member notified: Doug Patient and family notified of of transfer: 12/24/19  Discharge Plan and Services     Post Acute Care Choice: [place]                               Social Determinants of Health (SDOH) Interventions     Readmission Risk Interventions No flowsheet data found.

## 2019-12-24 NOTE — Progress Notes (Signed)
Gave report to [facility] nurse. [facility] has arrived to transfer the patient.

## 2019-12-24 NOTE — Discharge Summary (Addendum)
Physician Discharge Summary  Casey Gilmore [id number] DOB: Mar 18, 1941  PCP: Mayra Neer, MD  Admitted from: Home Discharged to: SNF  Admit date: 12/17/2019 Discharge date: 12/24/2019  Recommendations for Outpatient Follow-up:   Follow-up Information    MD at SNF. Schedule an appointment as soon as possible for a visit.   Why: To be seen in 2 to 3 days with repeat labs (CBC & BMP).       Hiram Gash, MD. Schedule an appointment as soon as possible for a visit.   Specialty: Orthopedic Surgery Why: Follow-up regarding left knee pain. Contact information: [address] [place] [address] [phone]        Mayra Neer, MD. Schedule an appointment as soon as possible for a visit.   Specialty: Family Medicine Why: Upon discharge from SNF. Contact information: [address] [place] [address] [phone]            Home Health: N/A Equipment/Devices: TBD at SNF  Discharge Condition: Improved and stable. CODE STATUS: DNR Diet recommendation: Heart healthy & diabetic diet.  Discharge Diagnoses:  Principal Problem:   Fall Active Problems:   Diabetes mellitus ([place])   Chronic diastolic heart failure (HCC)   Benign essential HTN   COPD (chronic obstructive pulmonary disease) with chronic bronchitis (HCC)   Chronic pain syndrome   Multiple closed fractures of ribs of left side   Pneumothorax, closed, traumatic, initial encounter   Atrial fibrillation with RVR (HCC)   Brief Summary: 79 year old male with PMH of upper GI bleed, s/p bioprosthetic AVR, A. fib, CAD, HTN, HLD, DM 2, COPD and vocal cord CA presented with a fall.  He was found to have hypoglycemia, multiple rib fractures and a small pneumothorax.  Trauma/general surgery service and cardiology consulted.  Assessment and plan:  1. Mechanical fall with left rib fractures (4-8) and small apical pneumothorax: Patient fell at home on [id number], complicated  by multiple rib fractures and traumatic pneumothorax.  General surgery consulted and recommended conservative management.  Follow-up chest x-ray was stable.  Aggressive pulmonary toilet including incentive spirometry and flutter valve.  Pain appears to be reasonably controlled.  Patient on scheduled acetaminophen and as needed oxycodone for chest pain related to rib fractures and left knee pain.  He has not used much of the opioids which should be minimized as much as possible in this elderly frail male patient to avoid complications such as altered mental status, increased fall risk, constipation etc.  Patient is going to SNF for rehab. 2. COVID-19 positive: COVID-19 testing was done for SNF placement which came back positive.  Patient however is asymptomatic and no treatments were initiated.  He will need to be quarantined at [facility] for 21 days. 3. Left knee pain: Patient has had left knee replacement done many years ago.  He complained of worsening left knee pain.  X-ray of left knee showed joint effusion and intact hardware.  He has remained afebrile without visible acute left knee changes such as swelling, erythema or increased warmth.  Its not tender to touch today either.  Orthopedics were consulted and were unsuccessful in aspirating the knee for further evaluation.  They recommend outpatient follow-up with orthopedics.  Orthopedics were not concerned about a septic joint.  Uric acid is normal at 5.8. 4. Acute on chronic respiratory failure with hypoxia: Patient has underlying COPD but not on home oxygen.  Some of his hypoxia may be related to poor inspiratory effort related to  pain from rib fractures.  Aggressive pulmonary toilet, wean off oxygen as tolerated. 5. A. fib with RVR: Initially managed with Cardizem infusion.  Telemetry shows paroxysmal A. fib with BBB morphology but this morning is in sinus rhythm.  TSH normal.  Cardiology consulted and assisted with management.  Not a anticoagulation  candidate due to fall risk and history of GI bleed.  2D echo showed normal EF and grade 2 diastolic dysfunction.  Cardiology recommends as needed outpatient follow-up with cardiology.  As per cardiology, his RVR was likely triggered by fall with multiple rib fractures and pneumothorax and electrolyte abnormalities. 6. Chronic diastolic CHF: Clinically euvolemic.  Has history of bioprosthetic valve.  Echo results as noted above. 7. Hypocalcemia/hypokalemia: Improved.  Continue calcium supplements and follow BMP closely as outpatient.  PTH within normal limits.   8. Type II DM: Initially hypoglycemic.  A1c 5.8.  Home Metformin and Xultophy held.  Continue SSI with reasonable control and no further hypoglycemic episodes.  Encourage oral intake. 9. Chronic pain: Home Vicodin discontinued.  Continue Robaxin.  Schedule Tylenol.  As needed low-dose oxycodone as needed.  I reviewed [place] PDMP and he was prescribed 30-day supply (60 tablets) of hydrocodone-acetaminophen 3-325 mg tablets on 11/13/2019 but currently provided short supply since patient is going to SNF.  Continue Colace.  Patient also has lidocaine patch on his right hip. 10. Aortic stenosis, s/p bioprosthetic AVR. 11. Altered mental status: Suspect underlying element of baseline dementia.  Follow delirium precautions, minimize sedatives and opioids as much as tolerated. 12. Stage II sacral decubitus ulcer: Likely present on admission.  Appears clean and without acute findings of infection.  Offload pressure on that area by frequent position change and improve nutritional status.  Dry dressing change daily and as needed. 13. Essential hypertension: Mostly controlled. 14. History of GI bleed: No overt GI bleed reported.  Continue PPI. 15. Microcytic anemia: Stable.  Outpatient evaluation as deemed necessary.  Could have iron deficiency anemia.  Continue iron supplements.    Consultations:  General  surgery  Cardiology  Orthopedics  Procedures:  Failed attempt by orthopedics to aspirate left knee   Discharge Instructions  Discharge Instructions    Call MD for:  difficulty breathing, headache or visual disturbances   Complete by: As directed    Call MD for:  extreme fatigue   Complete by: As directed    Call MD for:  persistant dizziness or light-headedness   Complete by: As directed    Call MD for:  persistant nausea and vomiting   Complete by: As directed    Call MD for:  redness, tenderness, or signs of infection (pain, swelling, redness, odor or green/yellow discharge around incision site)   Complete by: As directed    Call MD for:  severe uncontrolled pain   Complete by: As directed    Call MD for:  temperature >100.4   Complete by: As directed    Diet - low sodium heart healthy   Complete by: As directed    Diet Carb Modified   Complete by: As directed    Discharge instructions   Complete by: As directed    Aggressive pulmonary toilet.  Encourage regular use of incentive spirometer.  Also offload pressure from sacrum, change positions frequently to allow healing of sacral decubitus ulcer. Currently on oxygen via nasal cannula at 3 L/min and saturating in the low 90s.  Monitor oxygen saturations and wean off as tolerated for saturations >90%.   Increase activity slowly  Complete by: As directed        Medication List    STOP taking these medications   HYDROcodone-acetaminophen 5-325 MG tablet Commonly known as: NORCO/VICODIN   metFORMIN 1000 MG tablet Commonly known as: GLUCOPHAGE   Xultophy 100-3.6 UNIT-MG/ML Sopn Generic drug: Insulin Degludec-Liraglutide     TAKE these medications   acetaminophen 325 MG tablet Commonly known as: TYLENOL Take 2 tablets (650 mg total) by mouth every 6 (six) hours.   albuterol 108 (90 Base) MCG/ACT inhaler Commonly known as: VENTOLIN HFA Inhale 2 puffs into the lungs every 6 (six) hours as needed for wheezing or  shortness of breath.   calcium carbonate 1250 (500 Ca) MG tablet Commonly known as: OS-CAL - dosed in mg of elemental calcium Take 1 tablet (500 mg of elemental calcium total) by mouth 2 (two) times daily with a meal.   diltiazem 120 MG 24 hr capsule Commonly known as: CARDIZEM CD Take 1 capsule (120 mg total) by mouth daily. Start taking on: December 25, 2019   docusate sodium 100 MG capsule Commonly known as: COLACE Take 1 capsule (100 mg total) by mouth 2 (two) times daily.   ferrous sulfate 325 (65 FE) MG tablet Take 1 tablet (325 mg total) by mouth daily with breakfast.   insulin aspart 100 UNIT/ML injection Commonly known as: novoLOG Inject 0-15 Units into the skin 3 (three) times daily with meals. CBG < 70: implement hypoglycemia protocol CBG 70 - 120: 0 units CBG 121 - 150: 2 units CBG 151 - 200: 3 units CBG 201 - 250: 5 units CBG 251 - 300: 8 units CBG 301 - 350: 11 units CBG 351 - 400: 15 units CBG > 400: call MD.   lidocaine 5 % Commonly known as: LIDODERM Place 1 patch onto the skin daily. Remove & Discard patch within 12 hours or as directed by MD. Apply to Right Hip. Start taking on: December 25, 2019   methocarbamol 500 MG tablet Commonly known as: ROBAXIN Take 1 tablet (500 mg total) by mouth every 6 (six) hours as needed for muscle spasms.   oxyCODONE 5 MG immediate release tablet Commonly known as: Oxy IR/ROXICODONE Take 1 tablet (5 mg total) by mouth every 8 (eight) hours as needed for moderate pain or severe pain.   pantoprazole 40 MG tablet Commonly known as: PROTONIX Take 1 tablet (40 mg total) by mouth daily.      No Known Allergies    Procedures/Studies: DG Knee 1-2 Views Left  Result Date: 12/19/2019 CLINICAL DATA:  Left knee pain towards the apex of the patella EXAM: LEFT KNEE - 1-2 VIEW COMPARISON:  Radiographs Apr 17, 2018 FINDINGS: Redemonstration of a total left knee arthroplasty with posterior patellar resurfacing. No periprosthetic  fracture evidence of subsidence or other acute complication is seen. There is a moderate knee effusion. Extensive vascular calcium is noted posterior to the knee. Question a remote posttraumatic deformity of the proximal fibula. Appearance is unchanged from comparison. IMPRESSION: 1. Stable appearance of total left knee arthroplasty without evidence of acute complication. 2. Moderate knee effusion. No acute fracture or traumatic malalignment. 3. Extensive vascular calcium posterior to the knee. Electronically Signed   By: Lovena Le M.D.   On: 12/19/2019 20:39   CT Head Wo Contrast  Result Date: 12/17/2019 CLINICAL DATA:  Altered mental status since fall yesterday. EXAM: CT HEAD WITHOUT CONTRAST CT CERVICAL SPINE WITHOUT CONTRAST TECHNIQUE: Multidetector CT imaging of the head and cervical spine was performed following  the standard protocol without intravenous contrast. Multiplanar CT image reconstructions of the cervical spine were also generated. COMPARISON:  CT head and cervical spine dated January 15, 2018. 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 No evidence of hemorrhage, hydrocephalus, extra-axial collection or mass lesion/mass effect. Stable moderate atrophy and mild chronic microvascular ischemic changes. Vascular: Atherosclerotic vascular calcification of the carotid siphons. No hyperdense vessel. Skull: Normal. Negative for fracture or focal lesion. Sinuses/Orbits: No acute finding. Other: None. CT CERVICAL SPINE FINDINGS Alignment: Normal. Skull base and vertebrae: No acute fracture. No primary bone lesion or focal pathologic process. Soft tissues and spinal canal: No prevertebral fluid or swelling. No visible canal hematoma. Disc levels: Moderate disc height loss and uncovertebral hypertrophy at C5-C6 and C6-C7, unchanged. Upper chest: Small left apical pneumothorax. Other: None. IMPRESSION: 1. Small age  indeterminate lacunar infarcts in the right cerebellum, new since February 2019. 2. No acute cervical spine fracture. Stable moderate degenerative changes at C5-C6 and C6-C7. 3. Small left apical pneumothorax. Electronically Signed   By: Titus Dubin M.D.   On: 12/17/2019 13:09   CT Cervical Spine Wo Contrast  Result Date: 12/17/2019 CLINICAL DATA:  Altered mental status since fall yesterday. EXAM: CT HEAD WITHOUT CONTRAST CT CERVICAL SPINE WITHOUT CONTRAST TECHNIQUE: Multidetector CT imaging of the head and cervical spine was performed following the standard protocol without intravenous contrast. Multiplanar CT image reconstructions of the cervical spine were also generated. COMPARISON:  CT head and cervical spine dated January 15, 2018. 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 No evidence of hemorrhage, hydrocephalus, extra-axial collection or mass lesion/mass effect. Stable moderate atrophy and mild chronic microvascular ischemic changes. Vascular: Atherosclerotic vascular calcification of the carotid siphons. No hyperdense vessel. Skull: Normal. Negative for fracture or focal lesion. Sinuses/Orbits: No acute finding. Other: None. CT CERVICAL SPINE FINDINGS Alignment: Normal. Skull base and vertebrae: No acute fracture. No primary bone lesion or focal pathologic process. Soft tissues and spinal canal: No prevertebral fluid or swelling. No visible canal hematoma. Disc levels: Moderate disc height loss and uncovertebral hypertrophy at C5-C6 and C6-C7, unchanged. Upper chest: Small left apical pneumothorax. Other: None. IMPRESSION: 1. 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 2. No acute cervical spine fracture. Stable moderate degenerative changes at C5-C6 and C6-C7. 3. Small left apical pneumothorax. Electronically Signed   By: Titus Dubin M.D.   On: 12/17/2019 13:09    DG CHEST PORT 1 VIEW  Result Date: 12/18/2019 CLINICAL DATA:  Pain following fall EXAM: PORTABLE CHEST 1 VIEW COMPARISON:  December 17, 2019 FINDINGS: Multiple displaced rib fractures on the left are again noted with small left apical pneumothorax, stable. No tension component. There is subcutaneous air. There is persistent left lower lobe consolidation with small left pleural effusion. Right lung is clear. Heart size is upper normal with pulmonary vascularity normal. There is aortic atherosclerosis. Patient is status post aortic valve replacement. No adenopathy. Bones are osteoporotic. Postoperative change noted in left proximal humerus. IMPRESSION: Displaced rib fractures on the left appear grossly stable. Small left apical pneumothorax is stable as is subcutaneous air on the left. There remains left lower lobe consolidation with small left pleural effusion. Right lung is clear. Cardiac silhouette is stable with postoperative changes secondary to aortic valve replacement. Aortic Atherosclerosis (ICD10-I70.0). Appearance similar compared to 1 day prior. Electronically Signed  By: Lowella Grip III M.D.   On: 12/18/2019 08:02   DG CHEST PORT 1 VIEW  Result Date: 12/17/2019 CLINICAL DATA:  Acute respiratory failure EXAM: PORTABLE CHEST 1 VIEW COMPARISON:  Earlier same day FINDINGS: Multiple left rib fractures are again identified with adjacent subcutaneous emphysema. Probable small left apical pneumothorax remains unchanged. Patchy left lung density, which may reflect atelectasis/contusion. Stable cardiomediastinal contours with evidence of aortic valve replacement. IMPRESSION: Probable small left apical pneumothorax remains unchanged. Patchy left lung density probably reflecting atelectasis/contusion. Electronically Signed   By: Macy Mis M.D.   On: 12/17/2019 16:59   DG Chest Portable 1 View  Result Date: 12/17/2019 CLINICAL DATA:  Fall.  Confusion.  Chest pain. EXAM: PORTABLE CHEST 1 VIEW  COMPARISON:  09/01/2018 FINDINGS: Previous median sternotomy and aortic valve replacement. Mild cardiac enlargement. Hiatal hernia is noted. The right chest is clear. There are fractures of the right lateral ribs, at least rib numbers 4, 5, 6, 7 and 8. There is a left pneumothorax without tension, estimated at 10%. IMPRESSION: Left-sided rib fractures 4 through 8. Left pneumothorax without tension, estimated at 10%. Previous aortic valve replacement. Hiatal hernia. Electronically Signed   By: Nelson Chimes M.D.   On: 12/17/2019 11:13   ECHOCARDIOGRAM COMPLETE  Result Date: 12/18/2019   ECHOCARDIOGRAM REPORT   Patient Name:   Casey Gilmore Date of Exam: 12/18/2019 Medical Rec #:  [id number]         Height:       72.0 in Accession #:    [id number]        Weight:       162.9 lb Date of Birth:  Apr 03, 1941         BSA:          1.95 m Patient Age:    66 years          BP:           115/69 mmHg Patient Gender: M                 HR:           72 bpm. Exam Location:  Inpatient Procedure: 2D Echo Indications:    aortic stenosis  History:        Patient has prior history of Echocardiogram examinations, most                 recent 04/16/2018. COPD; Risk Factors:Hypertension, Dyslipidemia,                 Diabetes and Former Smoker. Aortic Valve: A 24mm Magna                 bioprosthetic aortic valve Procedure Date: 10/15/2011 TAVR.                 NSTEMI.  Sonographer:    Jannett Celestine RDCS (AE) Referring Phys: [place]  Sonographer Comments: No parasternal window and no subcostal window. IMPRESSIONS  1. Technically limited study. No parasternal window and no subcostal window.  2. Left ventricular ejection fraction, by visual estimation, is 55 to 60%. The left ventricle has normal function. There is no left ventricular hypertrophy.  3. Probable mild hypokinesis of the left ventricular, basal inferoseptal wall and inferior wall.  4. Left ventricular diastolic parameters are consistent with Grade II diastolic  dysfunction (pseudonormalization).  5. Elevated left atrial pressure.  6. Global right ventricle has normal systolic function.The right ventricular size is not well  visualized. Right vetricular wall thickness was not assessed.  7. Left atrial size was mildly dilated.  8. Right atrial size was not well visualized.  9. Moderate mitral annular calcification. 10. The mitral valve is degenerative. No evidence of mitral valve regurgitation. No evidence of mitral stenosis. 11. The tricuspid valve is not well visualized. 12. Bioprosthetic aortic valve was poorly seen and gradients could not be accurately assessed. Aortic valve regurgitation is not visualized. 13. The pulmonic valve was not well visualized. Pulmonic valve regurgitation is not visualized. 14. TR signal is inadequate for assessing pulmonary artery systolic pressure. 15. The interatrial septum was not assessed. 16. The aortic root was not well visualized. FINDINGS  Left Ventricle: Left ventricular ejection fraction, by visual estimation, is 55 to 60%. The left ventricle has normal function. Mild hypokinesis of the left ventricular, basal inferoseptal wall and inferior wall. The left ventricle demonstrates regional  wall motion abnormalities. The left ventricular internal cavity size was the left ventricle is normal in size. There is no left ventricular hypertrophy. Left ventricular diastolic parameters are consistent with Grade II diastolic dysfunction (pseudonormalization). Elevated left atrial pressure. Right Ventricle: The right ventricular size is not well visualized. Right vetricular wall thickness was not assessed. Global RV systolic function is has normal systolic function. Left Atrium: Left atrial size was mildly dilated. Right Atrium: Right atrial size was not well visualized Pericardium: There is no evidence of pericardial effusion. Mitral Valve: The mitral valve is degenerative in appearance. Moderate mitral annular calcification. No evidence of  mitral valve regurgitation. No evidence of mitral valve stenosis by observation. Tricuspid Valve: The tricuspid valve is not well visualized. Tricuspid valve regurgitation is not demonstrated. Aortic Valve: The aortic valve was not well visualized. Aortic valve regurgitation is not visualized. 62mm Magna bioprosthetic aortic valve valve is present in the aortic position. Procedure Date: 10/15/2011. Bioprosthetic aortic valve was poorly seen and gradients could not be accurately assessed. Pulmonic Valve: The pulmonic valve was not well visualized. Pulmonic valve regurgitation is not visualized. Pulmonic regurgitation is not visualized. Aorta: The aortic root was not well visualized. IAS/Shunts: The interatrial septum was not assessed.   Diastology LV e' lateral:   12.18 cm/s LV E/e' lateral: 10.2 LV e' medial:    5.55 cm/s LV E/e' medial:  22.3  LEFT ATRIUM             Index LA Vol (A2C):   42.7 ml 21.87 ml/m LA Vol (A4C):   69.6 ml 35.64 ml/m LA Biplane Vol: 55.8 ml 28.58 ml/m  AORTIC VALVE LVOT Vmax:   79.30 cm/s LVOT Vmean:  56.300 cm/s LVOT VTI:    0.158 m MITRAL VALVE MV Area (PHT): 2.29 cm             SHUNTS MV PHT:        95.99 msec           Systemic VTI: 0.16 m MV Decel Time: 331 msec MV E velocity: 124.00 cm/s 103 cm/s  Dani Gobble Croitoru MD Electronically signed by Sanda Klein MD Signature Date/Time: 12/18/2019/1:21:07 PM    Final    DG Hip Port [place] W or Wo Pelvis 1 View Left  Result Date: 12/17/2019 CLINICAL DATA:  Left hip pain after fall. EXAM: DG HIP (WITH OR WITHOUT PELVIS) 1V PORT LEFT COMPARISON:  Apr 18, 2018. FINDINGS: Status post surgical fusion of the left hip joint. Status post surgical internal fixation of proximal left femoral fracture. Status post right total hip arthroplasty. No definite acute  fracture or dislocation is noted. IMPRESSION: Postsurgical changes as described above. No definite acute abnormality is noted. Electronically Signed   By: Marijo Conception M.D.   On:  12/17/2019 13:54       Subjective: Patient was interviewed and examined along with RN at bedside.  Reports anterior chest wall pain, mild, worse with deep inspiration.  Also complains of left knee pain on movement.  Denies dyspnea or cough.  Appears slightly confused.  Discharge Exam:  Vitals:   12/23/19 1254 12/23/19 2026 12/24/19 0422 12/24/19 0756  BP: 140/79 (!) 138/104 (!) 163/70 (!) 152/74  Pulse: 81 81 83 79  Resp: 20 (!) 24 18 19   Temp: 98.4 F (36.9 C) 98 F (36.7 C) 98.1 F (36.7 C) 98.5 F (36.9 C)  TempSrc: Oral Oral Oral Oral  SpO2: 91% (!) 87% 91% 91%  Weight:   76.2 kg   Height:        General: Pleasant elderly male, moderately built and frail, chronically ill looking lying comfortably propped up in bed without distress. Cardiovascular: S1 & S2 heard, RRR, S1/S2 +. No murmurs, rubs, gallops or clicks. No JVD or pedal edema.  Telemetry personally reviewed: Paroxysmal A. fib with controlled ventricular rate.  In sinus rhythm this morning. Respiratory: Slightly diminished breath sounds in the bases with occasional basal crackles but otherwise clear to auscultation without wheezing or rhonchi.  No increased work of breathing. Abdominal:  Non distended, non tender & soft. No organomegaly or masses appreciated. Normal bowel sounds heard. CNS: Alert and oriented x2. No focal deficits. Extremities: no edema, no cyanosis.  Left knee surgical scar but no acute findings.  Some painful range of movements. Skin: Approximately 2 cm size stage II mid sacral decubitus ulcer without acute findings suggestive of infection Psychiatric: Appears slightly confused.  Cooperated with exam.  Not agitated.     The results of significant diagnostics from this hospitalization (including imaging, microbiology, ancillary and laboratory) are listed below for reference.     Microbiology: Recent Results (from the past 240 hour(s))  Urine culture     Status: Abnormal   Collection Time:  12/17/19  9:55 AM   Specimen: Urine, Random  Result Value Ref Range Status   Specimen Description URINE, RANDOM  Final   Special Requests NONE  Final   Culture (A)  Final    <10,000 COLONIES/mL INSIGNIFICANT GROWTH Performed at [facility], [address], [place], [place] [address]    Report Status 12/18/2019 FINAL  Final  SARS CORONAVIRUS 2 (TAT 6-24 HRS) Nasopharyngeal Nasopharyngeal Swab     Status: None   Collection Time: 12/17/19  2:30 PM   Specimen: Nasopharyngeal Swab  Result Value Ref Range Status   SARS Coronavirus 2 NEGATIVE NEGATIVE Final    Comment: (NOTE) SARS-CoV-2 target nucleic acids are NOT DETECTED. The SARS-CoV-2 RNA is generally detectable in upper and lower respiratory specimens during the acute phase of infection. Negative results do not preclude SARS-CoV-2 infection, do not rule out co-infections with other pathogens, and should not be used as the sole basis for treatment or other patient management decisions. Negative results must be combined with clinical observations, patient history, and epidemiological information. The expected result is Negative. Fact Sheet for Patients: SugarRoll.be Fact Sheet for Healthcare Providers: https://www.woods-mathews.com/ This test is not yet approved or cleared by the [place] FDA and  has been authorized for detection and/or diagnosis of SARS-CoV-2 by FDA under an Emergency Use Authorization (EUA). This EUA will  remain  in effect (meaning this test can be used) for the duration of the COVID-19 declaration under Section 56 4(b)(1) of the Act, 21 U.S.C. section 360bbb-3(b)(1), unless the authorization is terminated or revoked sooner. Performed at [facility], [place] [address], [place], [place] [address]   SARS CORONAVIRUS 2 (TAT 6-24 HRS) Nasopharyngeal Nasopharyngeal Swab     Status: Abnormal   Collection Time: 12/21/19  9:46 AM   Specimen: Nasopharyngeal Swab   Result Value Ref Range Status   SARS Coronavirus 2 POSITIVE (A) NEGATIVE Final    Comment: RESULT CALLED TO, READ BACK BY AND VERIFIED WITH: T.NGU RN [id number] 12/21/19 MCCORMICK K (NOTE) SARS-CoV-2 target nucleic acids are DETECTED. The SARS-CoV-2 RNA is generally detectable in upper and lower respiratory specimens during the acute phase of infection. Positive results are indicative of the presence of SARS-CoV-2 RNA. Clinical correlation with patient history and other diagnostic information is  necessary to determine patient infection status. Positive results do not rule out bacterial infection or co-infection with other viruses.  The expected result is Negative. Fact Sheet for Patients: SugarRoll.be Fact Sheet for Healthcare Providers: https://www.woods-mathews.com/ This test is not yet approved or cleared by the [place] FDA and  has been authorized for detection and/or diagnosis of SARS-CoV-2 by FDA under an Emergency Use Authorization (EUA). This EUA will remain  in effect (meaning this test can be used) for the  duration of the COVID-19 declaration under Section 564(b)(1) of the Act, 21 U.S.C. section 360bbb-3(b)(1), unless the authorization is terminated or revoked sooner. Performed at [facility], [place] [address] [place], [place] [address]      Labs: CBC: Recent Labs  Lab 12/18/19 0510 12/20/19 1432 12/22/19 0526  WBC 12.7* 12.6* 10.3  NEUTROABS  --  10.2*  --   HGB 9.5* 9.8* 9.9*  HCT 31.2* 32.0* 32.5*  MCV 75.4* 74.6* 74.9*  PLT 315 364 [id number]    Basic Metabolic Panel: Recent Labs  Lab 12/18/19 0510 12/18/19 1244 12/19/19 0439 12/20/19 1432 12/22/19 0526  NA 142 140 143 140 143  K 3.0* 3.6 4.3 4.3 4.7  CL 101 99 103 99 104  CO2 28 27 28 28 24   GLUCOSE 96 195* 167* 222* 158*  BUN 22 24* 26* 29* 28*  CREATININE 1.23 1.26* 1.22 1.21 1.24  CALCIUM 6.1* 6.3* 6.4* 6.8* 7.1*  PHOS  --  3.9 3.1 3.2 3.2     Liver Function Tests: Recent Labs  Lab 12/18/19 0510 12/18/19 1244 12/19/19 0439 12/20/19 1432 12/22/19 0526  AST 16  --   --   --   --   ALT 8  --   --   --   --   ALKPHOS 71  --   --   --   --   BILITOT 1.1  --   --   --   --   PROT 5.3*  --   --   --   --   ALBUMIN 2.5* 2.5* 2.4* 2.3* 2.1*    CBG: Recent Labs  Lab 12/23/19 1821 12/23/19 2013 12/24/19 0049 12/24/19 0430 12/24/19 0752  GLUCAP 135* 131* 131* 123* 128*     Urinalysis    Component Value Date/Time   COLORURINE YELLOW 12/17/2019 1145   APPEARANCEUR HAZY (A) 12/17/2019 1145   LABSPEC 1.014 12/17/2019 1145   PHURINE 5.0 12/17/2019 1145   GLUCOSEU NEGATIVE 12/17/2019 1145   HGBUR SMALL (A) 12/17/2019 1145   BILIRUBINUR NEGATIVE 12/17/2019 1145  KETONESUR NEGATIVE 12/17/2019 1145   PROTEINUR NEGATIVE 12/17/2019 1145   UROBILINOGEN 1.0 10/12/2011 1353   NITRITE NEGATIVE 12/17/2019 1145   LEUKOCYTESUR TRACE (A) 12/17/2019 1145      Time coordinating discharge: 45 minutes  SIGNED:  Vernell Leep, MD, FACP, [facility]. [facility]  To contact the attending provider between 7A-7P or the covering provider during after hours 7P-7A, please log into the web site [email or web address] and access using universal  password for that web site. If you do not have the password, please call the hospital operator.

## 2019-12-25 DIAGNOSIS — U071 COVID-19: Secondary | ICD-10-CM | POA: Diagnosis not present

## 2019-12-25 DIAGNOSIS — I480 Paroxysmal atrial fibrillation: Secondary | ICD-10-CM | POA: Diagnosis not present

## 2019-12-25 DIAGNOSIS — E11649 Type 2 diabetes mellitus with hypoglycemia without coma: Secondary | ICD-10-CM | POA: Diagnosis not present

## 2019-12-25 DIAGNOSIS — R262 Difficulty in walking, not elsewhere classified: Secondary | ICD-10-CM | POA: Diagnosis not present

## 2019-12-25 LAB — CALCIUM, IONIZED: Calcium, Ionized, Serum: 4.4 mg/dL — ABNORMAL LOW (ref 4.5–5.6)

## 2019-12-27 DIAGNOSIS — J441 Chronic obstructive pulmonary disease with (acute) exacerbation: Secondary | ICD-10-CM | POA: Diagnosis not present

## 2019-12-27 DIAGNOSIS — D72828 Other elevated white blood cell count: Secondary | ICD-10-CM | POA: Diagnosis not present

## 2019-12-27 DIAGNOSIS — N138 Other obstructive and reflux uropathy: Secondary | ICD-10-CM | POA: Diagnosis not present

## 2019-12-27 DIAGNOSIS — U071 COVID-19: Secondary | ICD-10-CM | POA: Diagnosis not present

## 2019-12-31 DIAGNOSIS — M25462 Effusion, left knee: Secondary | ICD-10-CM | POA: Diagnosis not present

## 2019-12-31 DIAGNOSIS — U071 COVID-19: Secondary | ICD-10-CM | POA: Diagnosis not present

## 2019-12-31 DIAGNOSIS — J441 Chronic obstructive pulmonary disease with (acute) exacerbation: Secondary | ICD-10-CM | POA: Diagnosis not present

## 2020-01-08 DIAGNOSIS — U071 COVID-19: Secondary | ICD-10-CM | POA: Diagnosis not present

## 2020-01-08 DIAGNOSIS — D72829 Elevated white blood cell count, unspecified: Secondary | ICD-10-CM | POA: Diagnosis not present

## 2020-01-08 DIAGNOSIS — J189 Pneumonia, unspecified organism: Secondary | ICD-10-CM | POA: Diagnosis not present

## 2020-01-08 DIAGNOSIS — K1379 Other lesions of oral mucosa: Secondary | ICD-10-CM | POA: Diagnosis not present

## 2020-01-09 DIAGNOSIS — R05 Cough: Secondary | ICD-10-CM | POA: Diagnosis not present

## 2020-01-15 DIAGNOSIS — I480 Paroxysmal atrial fibrillation: Secondary | ICD-10-CM | POA: Diagnosis not present

## 2020-01-15 DIAGNOSIS — E11649 Type 2 diabetes mellitus with hypoglycemia without coma: Secondary | ICD-10-CM | POA: Diagnosis not present

## 2020-01-15 DIAGNOSIS — R262 Difficulty in walking, not elsewhere classified: Secondary | ICD-10-CM | POA: Diagnosis not present

## 2020-01-15 DIAGNOSIS — U071 COVID-19: Secondary | ICD-10-CM | POA: Diagnosis not present

## 2020-01-16 DIAGNOSIS — E11649 Type 2 diabetes mellitus with hypoglycemia without coma: Secondary | ICD-10-CM | POA: Diagnosis not present

## 2020-01-16 DIAGNOSIS — I480 Paroxysmal atrial fibrillation: Secondary | ICD-10-CM | POA: Diagnosis not present

## 2020-01-16 DIAGNOSIS — J441 Chronic obstructive pulmonary disease with (acute) exacerbation: Secondary | ICD-10-CM | POA: Diagnosis not present

## 2020-01-16 DIAGNOSIS — R262 Difficulty in walking, not elsewhere classified: Secondary | ICD-10-CM | POA: Diagnosis not present

## 2020-01-17 DIAGNOSIS — U071 COVID-19: Secondary | ICD-10-CM | POA: Diagnosis not present

## 2020-01-17 DIAGNOSIS — L89153 Pressure ulcer of sacral region, stage 3: Secondary | ICD-10-CM | POA: Diagnosis not present

## 2020-01-19 DIAGNOSIS — G894 Chronic pain syndrome: Secondary | ICD-10-CM | POA: Diagnosis not present

## 2020-01-19 DIAGNOSIS — Z7951 Long term (current) use of inhaled steroids: Secondary | ICD-10-CM | POA: Diagnosis not present

## 2020-01-19 DIAGNOSIS — I130 Hypertensive heart and chronic kidney disease with heart failure and stage 1 through stage 4 chronic kidney disease, or unspecified chronic kidney disease: Secondary | ICD-10-CM | POA: Diagnosis not present

## 2020-01-19 DIAGNOSIS — S2242XD Multiple fractures of ribs, left side, subsequent encounter for fracture with routine healing: Secondary | ICD-10-CM | POA: Diagnosis not present

## 2020-01-19 DIAGNOSIS — L89313 Pressure ulcer of right buttock, stage 3: Secondary | ICD-10-CM | POA: Diagnosis not present

## 2020-01-19 DIAGNOSIS — J449 Chronic obstructive pulmonary disease, unspecified: Secondary | ICD-10-CM | POA: Diagnosis not present

## 2020-01-19 DIAGNOSIS — I5032 Chronic diastolic (congestive) heart failure: Secondary | ICD-10-CM | POA: Diagnosis not present

## 2020-01-19 DIAGNOSIS — D631 Anemia in chronic kidney disease: Secondary | ICD-10-CM | POA: Diagnosis not present

## 2020-01-19 DIAGNOSIS — N189 Chronic kidney disease, unspecified: Secondary | ICD-10-CM | POA: Diagnosis not present

## 2020-01-19 DIAGNOSIS — I2510 Atherosclerotic heart disease of native coronary artery without angina pectoris: Secondary | ICD-10-CM | POA: Diagnosis not present

## 2020-01-19 DIAGNOSIS — Z794 Long term (current) use of insulin: Secondary | ICD-10-CM | POA: Diagnosis not present

## 2020-01-19 DIAGNOSIS — I4891 Unspecified atrial fibrillation: Secondary | ICD-10-CM | POA: Diagnosis not present

## 2020-01-19 DIAGNOSIS — E1122 Type 2 diabetes mellitus with diabetic chronic kidney disease: Secondary | ICD-10-CM | POA: Diagnosis not present

## 2020-01-19 DIAGNOSIS — J9611 Chronic respiratory failure with hypoxia: Secondary | ICD-10-CM | POA: Diagnosis not present

## 2020-01-19 DIAGNOSIS — R1312 Dysphagia, oropharyngeal phase: Secondary | ICD-10-CM | POA: Diagnosis not present

## 2020-01-19 DIAGNOSIS — U071 COVID-19: Secondary | ICD-10-CM | POA: Diagnosis not present

## 2020-01-28 ENCOUNTER — Telehealth: Payer: Self-pay

## 2020-01-28 NOTE — Telephone Encounter (Signed)
Phone call placed to patient's son to introduce Palliative care and to offer to schedule visit with NP. Son unsure if he wants to schedule with Palliative care as patient has nurses visiting through patient's health insurance. Patient's son stated that patient has a telehealth visit with Dr. Brigitte Pulse tomorrow and will discuss Palliative care with her.

## 2020-01-29 DIAGNOSIS — R627 Adult failure to thrive: Secondary | ICD-10-CM | POA: Diagnosis not present

## 2020-01-29 DIAGNOSIS — E46 Unspecified protein-calorie malnutrition: Secondary | ICD-10-CM | POA: Diagnosis not present

## 2020-01-29 DIAGNOSIS — S2239XA Fracture of one rib, unspecified side, initial encounter for closed fracture: Secondary | ICD-10-CM | POA: Diagnosis not present

## 2020-01-29 DIAGNOSIS — J939 Pneumothorax, unspecified: Secondary | ICD-10-CM | POA: Diagnosis not present

## 2020-03-06 DEATH — deceased
